# Patient Record
Sex: Female | Born: 1990
Health system: Southern US, Community
[De-identification: ages and names within clinical notes are randomized; demographics above are authoritative.]

## PROBLEM LIST (undated history)

## (undated) ENCOUNTER — Inpatient Hospital Stay (HOSPITAL_COMMUNITY): Payer: Self-pay

## (undated) DIAGNOSIS — F1910 Other psychoactive substance abuse, uncomplicated: Secondary | ICD-10-CM

## (undated) DIAGNOSIS — IMO0002 Reserved for concepts with insufficient information to code with codable children: Secondary | ICD-10-CM

## (undated) DIAGNOSIS — F1110 Opioid abuse, uncomplicated: Secondary | ICD-10-CM

## (undated) DIAGNOSIS — Z915 Personal history of self-harm: Secondary | ICD-10-CM

## (undated) DIAGNOSIS — Z9289 Personal history of other medical treatment: Secondary | ICD-10-CM

## (undated) HISTORY — PX: TONSILLECTOMY: SUR1361

## (undated) HISTORY — DX: Personal history of other medical treatment: Z92.89

---
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
## 2009-05-09 ENCOUNTER — Emergency Department (HOSPITAL_COMMUNITY): Admission: EM | Admit: 2009-05-09 | Discharge: 2009-05-09 | Payer: Self-pay | Admitting: Family Medicine

## 2009-06-10 ENCOUNTER — Emergency Department (HOSPITAL_BASED_OUTPATIENT_CLINIC_OR_DEPARTMENT_OTHER): Admission: EM | Admit: 2009-06-10 | Discharge: 2009-06-10 | Payer: Self-pay | Admitting: Emergency Medicine

## 2009-09-11 ENCOUNTER — Emergency Department (HOSPITAL_BASED_OUTPATIENT_CLINIC_OR_DEPARTMENT_OTHER): Admission: EM | Admit: 2009-09-11 | Discharge: 2009-09-11 | Payer: Self-pay | Admitting: Emergency Medicine

## 2011-01-18 ENCOUNTER — Encounter: Payer: Self-pay | Admitting: Obstetrics

## 2011-05-02 ENCOUNTER — Inpatient Hospital Stay (INDEPENDENT_AMBULATORY_CARE_PROVIDER_SITE_OTHER)
Admission: RE | Admit: 2011-05-02 | Discharge: 2011-05-02 | Disposition: A | Discharge status: Home / Self Care | Payer: Self-pay | Source: Ambulatory Visit | Attending: Family Medicine | Admitting: Family Medicine

## 2011-05-02 DIAGNOSIS — IMO0002 Reserved for concepts with insufficient information to code with codable children: Secondary | ICD-10-CM

## 2011-05-12 NOTE — Discharge Summary (Signed)
[name], [employer]               ACCOUNT NO.:  [id number]   MEDICAL RECORD NO.:  [id number]          PATIENT TYPE:  INP   LOCATION:  9153                          FACILITY:  [facility]   PHYSICIAN:  Tanya S. Shawnie Pons, M.D.   DATE OF BIRTH:  1991-07-06   DATE OF ADMISSION:  07/24/2008  DATE OF DISCHARGE:  07/26/2008                               DISCHARGE SUMMARY   [name] is an 20 year old primigravida patient at 38-1/[redacted] weeks  gestational age by ultrasound performed at approximately 24 weeks who  presented to the Maternity Admission Unit with complaints of fevers,  chills, nausea, vomiting, and right-sided low back pain.  During  evaluation in the MAU, she was found to have an acute right-sided  pyelonephritis based on an abnormal urinalysis, temperature of 99.6  degrees, as well as right flank and costovertebral angle tenderness.  She was admitted to the Antenatal Unit and received IV ceftriaxone 1 g  every 12 hours.  At approximately 8:30 p.m. on her first hospital day,  she spiked the temperature to 102.9.  She received fluid bolus and some  Tylenol; her temperature quickly resolved.  She remained afebrile for  the remainder of the hospital course.  Her iv antibiotics were continued  for 48 hours after the fever, and then she was changed to oral  antibiotics (keflex) and then discharged.  She will be discharged home  on course of oral antibiotic therapy.   Upon admission, the fetal heart rate was elevated from 160s to 170s, but  was reactive at that time.  After fluids and antipyretics, the fetal  heart rate returned to a baseline level of 140s with nice reactivity.  A  fetal heart tracing done right before discharge was also reactive and  showed good variability.  No contractions and no decelerations.   PERTINENT LABORATORY DATA:  Her urinalysis revealed trace leukocyte  esterase, positive nitrites, 7-10 white blood cells.  Her white blood  cell count on admission was 15.6.  Her  urine drug screen was negative  and her group B strep testing was also negative.   FINAL DIAGNOSES:  1. Acute right-sided pyelonephritis.  2. Pregnancy.   CONDITION AT DISCHARGE:  The patient is in good condition.  She was  given instructions to continue her antibiotics as well as followup in  the [facility] at [facility] on Monday, July 30, 2008, at  9:15 in the morning.      Odie Sera, DO  Electronically Signed     ______________________________  Shelbie Proctor. Shawnie Pons, M.D.    MC/MEDQ  D:  07/26/2008  T:  07/27/2008  Job:  [id number]
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
## 2011-05-15 NOTE — Discharge Summary (Signed)
[name], [name]               ACCOUNT NO.:  [id number]   MEDICAL RECORD NO.:  [id number]          PATIENT TYPE:  INP   LOCATION:  0103                          FACILITY:  [facility]   PHYSICIAN:  Lalla Brothers, MDDATE OF BIRTH:  [birth year]   DATE OF ADMISSION:  02/09/2007  DATE OF DISCHARGE:  02/16/2007                               DISCHARGE SUMMARY   IDENTIFICATION:  Imminently 20 year old female who had been an 11th  grade student at [employer] was admitted emergently  involuntarily on a [facility] petition for commitment in transfer  from [facility] for inpatient stabilization  and treatment of suicide risk and bipolar mood instability, having been  a run away for 2 months.  The patient had acute lacerations of the left  volar forearm, stating that life was not worth living.  She was most  distressed by breakup with boyfriend whom she had cut also, with  boyfriend harboring the patient as a runaway at various relatives' house  despite being younger than the patient himself.  She now perceives the  boyfriend is bringing a new girlfriend in to displace her, but the  patient is not willing to contract for safety.  She had significant  character erosion in the course of substance abuse and delinquent  behavior.  Mother has bipolar disorder and a history of addiction.  The  patient has been placed by the court under the guardianship of maternal  grandmother for the last 5 months, though she has now been on the run  for 2 months with family emphasizing the lack of sincere confrontation  of problems by various family members and various forms of enabling.  The patient had been on Adderall, Zoloft, Seroquel and Lamictal in the  past though she has been frequently noncompliant with medication over  the last year.  At the time of admission she is taking only Topamax 25  mg twice daily for migraine.  The patient reports a weight loss of  26  pounds recently, though she was seen in the emergency department in  [id number] weighing 98 pounds which would suggest a 9-pound weight loss.  For full details please see the typed admission assessment.   SYNOPSIS OF PRESENT ILLNESS:  Patient is initially devaluing and  resistant toward treatment and expects immediate discharge.  Patient  cries and laughs inappropriately with severe denial for symptoms and  consequences.  She acknowledges use of Xanax and Ecstasy once each in  the past, while reporting alcohol every 3 months and cannabis the night  before admission.  She minimizes substance use other than stating that  she has smoked 1 pack per day of cigarettes for 8 years.  There is  significant family history of addiction, especially in biological mother  who is apparently engaged to marry again, currently having bipolar  disorder and history of crack abuse.  Brother has ADHD.  Grandmother has  had extensive surgery.  THE PATIENT REPORTS ALLERGY TO DEXTROMETHORPHAN.  She reports tonsillectomy at age 74.  She has fibrocystic breasts.  She  had menarche at age  11 and menses have been regular.  She is thin and  undernourished in habitus.  She was treated for chlamydia in  October2007, and had her last GYN exam of [id number] with Dr. Selena Batten in  [place].  She had a CT scan of the head in [id number] when she went to  the emergency department for migraine with a CT being negative.   INITIAL MENTAL STATUS EXAM:  The patient has more low-functioning  neurosis than definite character disorder, though she had character  erosion with her delinquent behavior and substance abuse.  At the time  of admission, she has accelerated speech with some pressure.  Oppositional externalization is evident.  She has significant dysphoria  with desire to die.  She has little concern for her own problems or  necessary solutions.  She has suicidal ideation with a plan to die and a  history of progressive self  cutting.   LABORATORY FINDINGS:  CBC was normal with white count 6800, hemoglobin  13.3, MCV of 90 and platelet count 296,000.  Comprehensive metabolic  panel on admission revealed random glucose 134 at 2010 hours otherwise  normal with sodium 138, potassium 3.8, CO2 25, chloride 103, creatinine  0.79, calcium 9.7, albumin 3.9, AST 15, ALT 10 and GGT 17.  A repeat  comprehensive metabolic panel on the day before discharge on Topamax 200  mg daily in divided doses revealed CO2 of 25 with reference range 19-32  and chloride 103 with reference range 96-112.  Sodium was normal at 136,  potassium 4.1, random glucose 98, creatinine 0.83, calcium 9.1 and  albumin 3.6 with AST 16 and ALT 9.  Free T4 was normal at 1.15 and TSH  at 0.518.  Urine HCG was negative.  Urine drug screen was positive for  marijuana metabolites quantitated at 71 ng/mL with creatinine of 306  mg/dL, documenting adequate specimen.  Urinalysis was normal with  specific gravity of 1.028, small amount of bilirubin, ketones of 15,  small amount leukocyte esterase, 7-10 WBC, 0-2 RBC, few bacteria and  many epithelial with mucus present, suggesting a poor clean-catch.  RPR  was nonreactive.  Urine probe for Chlamydia trachomatis  by DNA  amplification was positive but that for gonorrhea was negative.  Electrocardiogram revealed low voltage in the limb and precordial leads  with combined R waves in leads I, II and III being 10 mm.  Rate was 87,  PR was 126, QRS of 78 and QTc of 440 milliseconds.   HOSPITAL COURSE AND TREATMENT:  General medical exam by Jorje Guild, PA-C  noted the patient to be thin, overall having moderate protein calorie  malnutrition.  Neurological exam was intact.  Admission height was 61-  3/4 inches and weight was 40.5 kg, with subsequent weight 41 kg midway  through the hospital stay and 41.5 kg at discharge.  Initial blood  pressure was 100/61 with heart rate of 94 supine and 118/68 with heart rate of 84  standing.  At the time of discharge, supine blood pressure  was 96/62 with heart rate of 85 and standing blood pressure 115/64 with  heart rate of 99.  Nutrition consult facilitated appropriate refeeding,  noting the patient to be 77% of her usual weight of 115 pounds, having a  BMI of 16.3.  She requires 1900-2100 kilocalories daily with 45-55 grams  of protein in at least 1900 mL of fluid.  She had Ensure supplements,  Lactaid at meals, multivitamin/multimineral, and more than 100% of her  needs  for adequate growth and weight gain to be met.  The patient did  cooperate with her nutritional refeeding, and had no evidence of  refeeding syndrome.  She had no withdrawal syndrome to suggest  physiologic need for detoxification.  Still, she manifests significant  craving initially requiring Nicoderm 14 mg patch as well as increased  Topamax.  Substance abuse consultation by Cleophas Dunker, LMST, CSAC, CTS  included cannabis abuse and alcohol abuse, recommending outpatient  treatment to include children of alcoholics group.  As the patient had  sustained sobriety, adequate nutrition and multidisciplinary therapies,  the patient softened to be appreciative of treatment and to look  forehead to opportunities for continued care even though she would  frequently regress as she maintained initially the demanding return to  the ex-boyfriend and the street.  Patient was recognized as a  significant risk to run again.  Family therapy was carried out with  maternal grandmother and mother, and every attempt was made to  coordinate the patient's subsequent care through the [facility] relative to her emergency petition for admission, Clearence Ped,  who has supervise the patient's placement at guardian grandmother's, and  Frederic Jericho,  who has provided the patient's outpatient treatment.  The  family concludes that DFS workers Elliot Gault and Sherian Rein, are  being overseen by the judge due  to the patient's ability to manipulate  and charm others into seeing her as capable of good judgment including  about presence of family.  Patient had previously been considered for  the [facility] group home in [place], though the family  preferred [facility].  Family reports the patient had bad side  effects on Seroquel and was noncompliant with Lamictal and Zoloft.  Therefore every effort was made to maximize with Topamax for mood,  substance abuse and impulse control disorder treatments in addition to  migraine, and dose was titrated up to 100 mg morning and bedtime and  tolerated well.  Maternal aunt has depression and there is family  history of diabetes and hypertension.  The patient has used cannabis  with her biological father and alcohol with friends.  She had 3 years of  therapy with Frederic Jericho at [phone].  Paperwork and proceedings for  entry into the [facility] program at [facility] in [place] was carried forth and extent that the hospital can accomplished that and the  family will need to sustain such.  They have no chance of entering there  for at least 2 weeks.  An [facility] was  available in [place], and the patient was discharged there  with the  agreement of the patient and family, and full knowledge of all others  involved.  The patient required no seclusion or restraint during  hospital stay.   FINAL DIAGNOSES:  AXIS I:  1. Bipolar disorder, mixed, moderate severity.  2. Oppositional defiant disorder.  3. Attention deficit hyperactivity disorder, combined type, moderate      severity.  4. Cannabis abuse.  5. Alcohol abuse.  6..  Parent-child problem.  1. Other interpersonal problem.  2. Other specified family circumstances.  3. Noncompliance with treatment.  AXIS II:  Diagnosis deferred.  AXIS III:  1. Self-inflicted lacerations, left forearm.  2. Migraine.  3. Moderate to severe protein calorie  malnutrition with severe weight      loss and EKG changes.  4. Birth control pill.  5. Fibrocystic breast.  6. Cigarette smoking.  7. Low  voltage EKG, possibly associated with weight loss and protein      calorie malnutrition.  8. Asymptomatic chlamydia urethritis.  9. ALLERGY TO DEXTROMETHORPHAN.  AXIS IV:  Stressors family severe, acute and chronic; legal moderate,  acute and chronic; phase of life severe, acute and chronic; medical  moderate, acute and chronic; school severe, acute and chronic.  AXIS V:  Global assessment of functioning on admission 36 with highest  in last year 57, and discharge global assessment of functioning was 47.   PLAN:  The patient has significant ongoing treatment needs but has made  reasonable progress in initial acute inpatient treatment.  Grounds were  not present to effectively pursue extension of acute inpatient treatment  involuntarily, or singular court commitment to residential treatment  without consideration of the patient's illegal behavior and violation of  court proceedings for placement thus far.  The patient is willing to  enter the [facility] program as well.  The patient is discharged  to mother and maternal grandmother to enter the [facility]  in [place], to see Charise Carwin at [phone] for that purpose after  discharge on the day of discharge.  Patient follows a weight gain diet  as per nutrition consult February 10, 2007.  She has no other  restrictions on activity except no runaway behavior, substance abuse, or  contact with ex-boyfriend.  Wounds are healing well and require no  special care other than protection from further injury.  She is  prescribed Topamax 100 mg every morning and bedtime, quantity #60 with 1  refill, and can use a multivitamin/ multimineral every morning over-the-  counter.  They are educated on the medications, laboratory findings,  diagnoses and FDA guidelines on treatment.  Zoloft and  Lamictal were not restarted, apparently last taking these 1 year ago, and Topamax more  recently though at a low dose until currently.  The patient has no  indication of pregnancy, though she had reported trying to get pregnant  with ex-boyfriend prior to being found by police.  She did receive  Zithromax 1000 mg, and retained and tolerated this well during the  hospital stay for her chlamydia urethritis and is educated on public  health containment of communicability guidelines.  Therapy over the last  3 years has been with Frederic Jericho, and she had some psychiatric care  apparently a [facility].      Lalla Brothers, MD  Electronically Signed     GEJ/MEDQ  D:  02/20/2007  T:  02/21/2007  Job:  [id number]   cc:   Frederic Jericho, phone [phone]

## 2011-07-08 ENCOUNTER — Emergency Department (HOSPITAL_COMMUNITY)
Admission: EM | Admit: 2011-07-08 | Discharge: 2011-07-08 | Disposition: A | Discharge status: Home / Self Care | Payer: Medicaid Other | Attending: Emergency Medicine | Admitting: Emergency Medicine

## 2011-07-08 DIAGNOSIS — H109 Unspecified conjunctivitis: Secondary | ICD-10-CM | POA: Insufficient documentation

## 2011-07-08 DIAGNOSIS — G43909 Migraine, unspecified, not intractable, without status migrainosus: Secondary | ICD-10-CM | POA: Insufficient documentation

## 2011-09-15 ENCOUNTER — Inpatient Hospital Stay (INDEPENDENT_AMBULATORY_CARE_PROVIDER_SITE_OTHER)
Admission: RE | Admit: 2011-09-15 | Discharge: 2011-09-15 | Disposition: A | Discharge status: Home / Self Care | Payer: Medicaid Other | Source: Ambulatory Visit | Attending: Family Medicine | Admitting: Family Medicine

## 2011-09-15 DIAGNOSIS — J019 Acute sinusitis, unspecified: Secondary | ICD-10-CM

## 2011-09-15 DIAGNOSIS — J40 Bronchitis, not specified as acute or chronic: Secondary | ICD-10-CM

## 2011-09-25 LAB — RAPID URINE DRUG SCREEN, HOSP PERFORMED
Barbiturates: NOT DETECTED
Benzodiazepines: NOT DETECTED
Tetrahydrocannabinol: NOT DETECTED

## 2011-09-25 LAB — URINE MICROSCOPIC-ADD ON

## 2011-09-25 LAB — POCT URINALYSIS DIP (DEVICE)
Bilirubin Urine: NEGATIVE
Glucose, UA: NEGATIVE
Ketones, ur: NEGATIVE
Specific Gravity, Urine: 1.015
pH: 6

## 2011-09-25 LAB — URINALYSIS, ROUTINE W REFLEX MICROSCOPIC
Glucose, UA: NEGATIVE
Ketones, ur: 15 — AB
Nitrite: POSITIVE — AB
Protein, ur: 30 — AB
Specific Gravity, Urine: 1.025
pH: 5.5

## 2011-09-25 LAB — CBC
HCT: 32.5 — ABNORMAL LOW
Hemoglobin: 10.6 — ABNORMAL LOW
MCHC: 33.8
MCV: 93.5
Platelets: 197
RBC: 3.31 — ABNORMAL LOW
RDW: 13.4
RDW: 13.6
WBC: 15.4 — ABNORMAL HIGH

## 2011-09-25 LAB — RPR: RPR Ser Ql: NONREACTIVE

## 2011-09-25 LAB — STREP B DNA PROBE: Strep Group B Ag: NEGATIVE

## 2011-09-25 LAB — URINE CULTURE

## 2011-09-25 LAB — COMPREHENSIVE METABOLIC PANEL
Glucose, Bld: 97
Total Protein: 5.8 — ABNORMAL LOW

## 2012-07-19 ENCOUNTER — Encounter (HOSPITAL_COMMUNITY): Payer: Self-pay

## 2012-07-19 ENCOUNTER — Emergency Department (HOSPITAL_COMMUNITY)
Admission: EM | Admit: 2012-07-19 | Discharge: 2012-07-19 | Disposition: A | Discharge status: Home / Self Care | Payer: Self-pay | Attending: Emergency Medicine | Admitting: Emergency Medicine

## 2012-07-19 DIAGNOSIS — N390 Urinary tract infection, site not specified: Secondary | ICD-10-CM

## 2012-07-19 DIAGNOSIS — B9689 Other specified bacterial agents as the cause of diseases classified elsewhere: Secondary | ICD-10-CM

## 2012-07-19 DIAGNOSIS — F17200 Nicotine dependence, unspecified, uncomplicated: Secondary | ICD-10-CM | POA: Insufficient documentation

## 2012-07-19 DIAGNOSIS — N949 Unspecified condition associated with female genital organs and menstrual cycle: Secondary | ICD-10-CM | POA: Insufficient documentation

## 2012-07-19 DIAGNOSIS — N898 Other specified noninflammatory disorders of vagina: Secondary | ICD-10-CM | POA: Insufficient documentation

## 2012-07-19 LAB — URINE MICROSCOPIC-ADD ON

## 2012-07-19 LAB — WET PREP, GENITAL
Clue Cells Wet Prep HPF POC: NONE SEEN
Trich, Wet Prep: NONE SEEN
Yeast Wet Prep HPF POC: NONE SEEN

## 2012-07-19 LAB — URINALYSIS, ROUTINE W REFLEX MICROSCOPIC
Glucose, UA: NEGATIVE mg/dL
Hgb urine dipstick: NEGATIVE
Ketones, ur: 15 mg/dL — AB
Nitrite: POSITIVE — AB
Protein, ur: 30 mg/dL — AB
Specific Gravity, Urine: 1.029 (ref 1.005–1.030)
Urobilinogen, UA: 0.2 mg/dL (ref 0.0–1.0)
pH: 6 (ref 5.0–8.0)

## 2012-07-19 LAB — POCT PREGNANCY, URINE: Preg Test, Ur: NEGATIVE

## 2012-07-19 MED ORDER — SULFAMETHOXAZOLE-TMP DS 800-160 MG PO TABS
1.0000 | ORAL_TABLET | Freq: Once | ORAL | Status: AC
  Administered 2012-07-19: 1 via ORAL
  Filled 2012-07-19: qty 1

## 2012-07-19 MED ORDER — TRAMADOL HCL 50 MG PO TABS: 50.0000 mg | ORAL_TABLET | Freq: Four times a day (QID) | ORAL | Status: AC | PRN

## 2012-07-19 MED ORDER — METRONIDAZOLE 500 MG PO TABS
500.0000 mg | ORAL_TABLET | Freq: Once | ORAL | Status: AC
  Administered 2012-07-19: 500 mg via ORAL
  Filled 2012-07-19: qty 1

## 2012-07-19 MED ORDER — SULFAMETHOXAZOLE-TMP DS 800-160 MG PO TABS: 1.0000 | ORAL_TABLET | Freq: Two times a day (BID) | ORAL | Status: AC

## 2012-07-19 MED ORDER — METRONIDAZOLE 500 MG PO TABS: 500.0000 mg | ORAL_TABLET | Freq: Two times a day (BID) | ORAL | Status: AC

## 2012-07-19 NOTE — ED Notes (Signed)
Pt c/o mid lower abd pain x's 2 days.  Also has vag. Discharge.

## 2012-07-19 NOTE — ED Notes (Signed)
sts abd pain, onset last Thursday getting worse, sts worse than child birth.

## 2012-07-19 NOTE — ED Provider Notes (Signed)
History     CSN: [id number]  Arrival date & time 07/19/12  1318   First MD Initiated Contact with Patient 07/19/12 1808      Chief Complaint  Patient presents with  . Abdominal Pain    (Consider location/radiation/quality/duration/timing/severity/associated sxs/prior treatment) HPI The patient presents with abdominal pain.  She notes her symptoms began 4 or 5 days ago, insidiously.  Since onset the pain has been worsening the pain is focally about the suprapubic area.  The pain is crampy, sharp, intermittent, though increasingly more present.  There are no clear exacerbating or alleviating factors.  The patient notes concurrent vaginal discharge, malodorous, without bleeding.  She denies any ongoing fevers, chills, dyspnea, chest pain or any other focal complaints. She notes a distant history of one prior STD, no current sexual activity. No past medical history on file.  No past surgical history on file.  No family history on file.  History  Substance Use Topics  . Smoking status: Current Everyday Smoker  . Smokeless tobacco: Not on file  . Alcohol Use: Yes    OB History    Grav Para Term Preterm Abortions TAB SAB Ect Mult Living                  Review of Systems  Constitutional:       HPI  HENT:       HPI otherwise negative  Eyes: Negative.   Respiratory:       HPI, otherwise negative  Cardiovascular:       HPI, otherwise nmegative  Gastrointestinal: Negative for vomiting.  Genitourinary:       HPI, otherwise negative  Musculoskeletal:       HPI, otherwise negative  Skin: Negative.   Neurological: Negative for syncope.    Allergies  Review of patient's allergies indicates no known allergies.  Home Medications  No current outpatient prescriptions on file.  BP 99/54  Pulse 82  Temp 98.3 F (36.8 C) (Oral)  Resp 18  SpO2 99%  Physical Exam  Nursing note and vitals reviewed. Constitutional: She is oriented to person, place, and time. She appears  well-developed and well-nourished. No distress.  HENT:  Head: Normocephalic and atraumatic.  Eyes: Conjunctivae and EOM are normal.  Cardiovascular: Normal rate and regular rhythm.   Pulmonary/Chest: Effort normal and breath sounds normal. No stridor. No respiratory distress.  Abdominal: She exhibits no distension. A hernia is present. Hernia confirmed positive in the right inguinal area and confirmed positive in the left inguinal area.  Genitourinary: No labial fusion. There is no rash, tenderness, lesion or injury on the right labia. There is no rash, tenderness, lesion or injury on the left labia. There is tenderness around the vagina. No erythema or bleeding around the vagina. No foreign body around the vagina. Vaginal discharge found.  Musculoskeletal: She exhibits no edema.  Lymphadenopathy:       Right: Inguinal adenopathy present.       Left: Inguinal adenopathy present.  Neurological: She is alert and oriented to person, place, and time. No cranial nerve deficit.  Skin: Skin is warm and dry.       Multiple tattoos  Psychiatric: She has a normal mood and affect.    ED Course  Procedures (including critical care time)  Labs Reviewed  URINALYSIS, ROUTINE W REFLEX MICROSCOPIC - Abnormal; Notable for the following:    APPearance TURBID (*)     Bilirubin Urine SMALL (*)     Ketones, ur 15 (*)  Protein, ur 30 (*)     Nitrite POSITIVE (*)     Leukocytes, UA SMALL (*)     All other components within normal limits  URINE MICROSCOPIC-ADD ON - Abnormal; Notable for the following:    Squamous Epithelial / LPF MANY (*)     Bacteria, UA MANY (*)     All other components within normal limits  POCT PREGNANCY, URINE  PREGNANCY, URINE  WET PREP, GENITAL  GC/CHLAMYDIA PROBE AMP, GENITAL   No results found.   No diagnosis found.    MDM  This otherwise well young female presents with new suprapubic pain, vaginal discharge.  On exam the patient has no cervical motion tenderness,  but does have vaginal discharge.  Given the patient's description of pain, discharge, she will be treated with antibiotics for her UTI, Flagyl for her bacterial vaginosis.  She was advised that her gonorrhea and Chlamydia studies are pending.  Given her endorsement of a monogamous relationship with little recent sexual activity, she will not be treated empirically  Gerhard Munch, MD 07/19/12 1916

## 2012-07-20 LAB — GC/CHLAMYDIA PROBE AMP, GENITAL: GC Probe Amp, Genital: NEGATIVE

## 2012-11-14 ENCOUNTER — Emergency Department (HOSPITAL_COMMUNITY)
Admission: EM | Admit: 2012-11-14 | Discharge: 2012-11-14 | Disposition: A | Discharge status: Home / Self Care | Payer: Self-pay | Attending: Emergency Medicine | Admitting: Emergency Medicine

## 2012-11-14 ENCOUNTER — Emergency Department (HOSPITAL_COMMUNITY): Payer: Self-pay

## 2012-11-14 DIAGNOSIS — S20219A Contusion of unspecified front wall of thorax, initial encounter: Secondary | ICD-10-CM | POA: Insufficient documentation

## 2012-11-14 DIAGNOSIS — IMO0002 Reserved for concepts with insufficient information to code with codable children: Secondary | ICD-10-CM | POA: Insufficient documentation

## 2012-11-14 DIAGNOSIS — F17200 Nicotine dependence, unspecified, uncomplicated: Secondary | ICD-10-CM | POA: Insufficient documentation

## 2012-11-14 DIAGNOSIS — Y9389 Activity, other specified: Secondary | ICD-10-CM | POA: Insufficient documentation

## 2012-11-14 DIAGNOSIS — Y9289 Other specified places as the place of occurrence of the external cause: Secondary | ICD-10-CM | POA: Insufficient documentation

## 2012-11-14 MED ORDER — HYDROCODONE-ACETAMINOPHEN 5-325 MG PO TABS: 1.0000 | ORAL_TABLET | ORAL | Status: DC | PRN

## 2012-11-14 NOTE — ED Notes (Signed)
Got pulled down front steps by her dog x 2 days ago hurt rt ribs hurts to breath and move

## 2012-11-14 NOTE — ED Provider Notes (Signed)
History     CSN: [id number]  Arrival date & time 11/14/12  1404   First MD Initiated Contact with Patient 11/14/12 1626      No chief complaint on file.   (Consider location/radiation/quality/duration/timing/severity/associated sxs/prior treatment) Patient is a 21 y.o. female presenting with chest pain. The history is provided by the patient. No language interpreter was used.  Chest Pain The chest pain began 2 days ago (Pt was going down stairs with her dog on a leash.  The dog pulled her down and she struck the right antero-lateral chest.). Chest pain occurs constantly. The chest pain is unchanged. The pain is associated with breathing and coughing. At its most intense, the pain is at 9/10. The pain is currently at 9/10. The severity of the pain is severe. The quality of the pain is described as sharp. The pain does not radiate. Chest pain is worsened by deep breathing. She tried nothing for the symptoms. There are no known risk factors. Past medical history comments: None     No past medical history on file.  No past surgical history on file.  No family history on file.  History  Substance Use Topics  . Smoking status: Current Every Day Smoker  . Smokeless tobacco: Not on file  . Alcohol Use: Yes    OB History    Grav Para Term Preterm Abortions TAB SAB Ect Mult Living                  Review of Systems  Cardiovascular: Positive for chest pain.  All other systems reviewed and are negative.    Allergies  Review of patient's allergies indicates no known allergies.  Home Medications   Current Outpatient Rx  Name  Route  Sig  Dispense  Refill  . HYDROCODONE-ACETAMINOPHEN 5-325 MG PO TABS   Oral   Take 1 tablet by mouth every 4 (four) hours as needed for pain.   20 tablet   0     BP 98/54  Pulse 102  Temp 98.3 F (36.8 C)  Resp 16  SpO2 99%  Physical Exam  Nursing note and vitals reviewed. Constitutional: She is oriented to person, place, and time.    Neck: Normal range of motion. Neck supple.  Cardiovascular: Normal rate, regular rhythm and normal heart sounds.   Pulmonary/Chest: Effort normal and breath sounds normal.       Has mild-moderate tenderness in right anterior chest at right costal margin.  No crepitance or subcutaneous emphysema.    Abdominal: Soft. Bowel sounds are normal. She exhibits no distension. There is no tenderness.  Musculoskeletal: Normal range of motion.  Neurological: She is alert and oriented to person, place, and time.       No sensory or motor deficit.  Skin: Skin is warm and dry.  Psychiatric: She has a normal mood and affect. Her behavior is normal.    ED Course  Procedures (including critical care time)  Labs Reviewed - No data to display Dg Ribs Unilateral W/chest Right  11/14/2012  *RADIOLOGY REPORT*  Clinical Data: 21 year old female status post blunt trauma 4 days ago with pain with coughing and difficulty breathing.  Fall.  RIGHT RIBS AND CHEST - 3+ VIEW  Comparison: None.  Findings: Normal lung volumes. Normal cardiac size and mediastinal contours.  Visualized tracheal air column is within normal limits. The lungs are clear.  No pneumothorax or effusion. Bone mineralization is within normal limits.  Oblique views of the right ribs with a  marker at the anterior right 8th costochondral margin.  No displaced right rib fracture identified.  No acute osseous abnormality identified.  IMPRESSION: No acute cardiopulmonary abnormality or acute traumatic injury identified.  No displaced right rib fracture identified.   Original Report Authenticated By: Odessa Fleming III, M.D.      1. Chest wall contusion    Rx hydrocodone-acetaminophen q4h prn pain.      Carleene Cooper III, MD 11/14/12 [phone]

## 2013-02-04 ENCOUNTER — Encounter (HOSPITAL_BASED_OUTPATIENT_CLINIC_OR_DEPARTMENT_OTHER): Payer: Self-pay | Admitting: *Deleted

## 2013-02-04 ENCOUNTER — Emergency Department (HOSPITAL_BASED_OUTPATIENT_CLINIC_OR_DEPARTMENT_OTHER)
Admission: EM | Admit: 2013-02-04 | Discharge: 2013-02-04 | Disposition: A | Discharge status: Home / Self Care | Payer: Self-pay | Attending: Emergency Medicine | Admitting: Emergency Medicine

## 2013-02-04 DIAGNOSIS — F17200 Nicotine dependence, unspecified, uncomplicated: Secondary | ICD-10-CM | POA: Insufficient documentation

## 2013-02-04 DIAGNOSIS — K0889 Other specified disorders of teeth and supporting structures: Secondary | ICD-10-CM

## 2013-02-04 DIAGNOSIS — R220 Localized swelling, mass and lump, head: Secondary | ICD-10-CM | POA: Insufficient documentation

## 2013-02-04 DIAGNOSIS — R221 Localized swelling, mass and lump, neck: Secondary | ICD-10-CM | POA: Insufficient documentation

## 2013-02-04 DIAGNOSIS — K089 Disorder of teeth and supporting structures, unspecified: Secondary | ICD-10-CM | POA: Insufficient documentation

## 2013-02-04 MED ORDER — PENICILLIN V POTASSIUM 500 MG PO TABS: 500.0000 mg | ORAL_TABLET | Freq: Four times a day (QID) | ORAL | Status: DC

## 2013-02-04 MED ORDER — HYDROCODONE-ACETAMINOPHEN 5-325 MG PO TABS: 2.0000 | ORAL_TABLET | ORAL | Status: DC | PRN

## 2013-02-04 NOTE — ED Provider Notes (Signed)
History     CSN: [id number]  Arrival date & time 02/04/13  1554   First MD Initiated Contact with Patient 02/04/13 1627      Chief Complaint  Patient presents with  . Dental Pain    (Consider location/radiation/quality/duration/timing/severity/associated sxs/prior treatment) Patient is a 22 y.o. female presenting with tooth pain. The history is provided by the patient. No language interpreter was used.  Dental PainThe primary symptoms include mouth pain. Episode onset: 2 weeks. The symptoms are worsening. The symptoms are new. The symptoms occur constantly.  Additional symptoms include: gum swelling and gum tenderness. Medical issues do not include: alcohol problem.  Pt complains of pain in her mouth,  Lower back molar right side  History reviewed. No pertinent past medical history.  Past Surgical History  Procedure Laterality Date  . Tonsillectomy      History reviewed. No pertinent family history.  History  Substance Use Topics  . Smoking status: Current Every Day Smoker  . Smokeless tobacco: Not on file  . Alcohol Use: Yes    OB History   Grav Para Term Preterm Abortions TAB SAB Ect Mult Living                  Review of Systems  HENT: Positive for dental problem.   All other systems reviewed and are negative.    Allergies  Review of patient's allergies indicates no known allergies.  Home Medications   Current Outpatient Rx  Name  Route  Sig  Dispense  Refill  . Acetaminophen (ACETAMIN PO)   Oral   Take 3 tablets by mouth 3 (three) times daily as needed. For pain         . Aspirin-Acetaminophen-Caffeine (GOODY HEADACHE PO)   Oral   Take 2 packets by mouth daily as needed. For pain         . HYDROcodone-acetaminophen (NORCO/VICODIN) 5-325 MG per tablet   Oral   Take 1 tablet by mouth every 4 (four) hours as needed for pain.   20 tablet   0   . Multiple Vitamin (MULTIVITAMIN WITH MINERALS) TABS   Oral   Take 1 tablet by mouth daily.           BP 100/72  Pulse 81  Temp(Src) 98.3 F (36.8 C) (Oral)  Resp 18  Ht 5' (1.524 m)  Wt 97 lb (43.999 kg)  BMI 18.94 kg/m2  SpO2 100%  Physical Exam  Nursing note and vitals reviewed. Constitutional: She is oriented to person, place, and time.  HENT:  Head: Normocephalic.  Mouth/Throat: Oropharynx is clear and moist.  Decay cavity outer botttom of right lower molar  Eyes: Pupils are equal, round, and reactive to light.  Neck: Normal range of motion.  Cardiovascular: Normal rate.   Pulmonary/Chest: Effort normal.  Neurological: She is alert and oriented to person, place, and time.  Skin: Skin is warm.  Psychiatric: She has a normal mood and affect.    ED Course  Procedures (including critical care time)  Labs Reviewed - No data to display No results found.   No diagnosis found.    MDM  pcn vk 500, hydrocodone,   Follow up with Dr Lucky Cowboy for dental evaluation        Lonia Skinner [place], [place] 02/04/13 1659

## 2013-02-04 NOTE — ED Provider Notes (Signed)
Medical screening examination/treatment/procedure(s) were performed by non-physician practitioner and as supervising physician I was immediately available for consultation/collaboration.    Nelia Shi, MD 02/04/13 1700

## 2013-02-04 NOTE — ED Notes (Signed)
Pt c/o pain to right lower molar x 2 days. Unable to eat. Can't see dentist for several weeks.

## 2013-04-20 ENCOUNTER — Encounter (HOSPITAL_COMMUNITY): Payer: Self-pay | Admitting: Cardiology

## 2013-04-20 ENCOUNTER — Emergency Department (HOSPITAL_COMMUNITY)
Admission: EM | Admit: 2013-04-20 | Discharge: 2013-04-20 | Disposition: A | Discharge status: Home / Self Care | Payer: Self-pay | Attending: Emergency Medicine | Admitting: Emergency Medicine

## 2013-04-20 DIAGNOSIS — L600 Ingrowing nail: Secondary | ICD-10-CM | POA: Insufficient documentation

## 2013-04-20 DIAGNOSIS — F17200 Nicotine dependence, unspecified, uncomplicated: Secondary | ICD-10-CM | POA: Insufficient documentation

## 2013-04-20 MED ORDER — HYDROCODONE-ACETAMINOPHEN 5-325 MG PO TABS: ORAL_TABLET | ORAL | Status: DC

## 2013-04-20 MED ORDER — IBUPROFEN 800 MG PO TABS: 800.0000 mg | ORAL_TABLET | Freq: Three times a day (TID) | ORAL | Status: DC

## 2013-04-20 MED ORDER — IBUPROFEN 400 MG PO TABS
800.0000 mg | ORAL_TABLET | Freq: Once | ORAL | Status: AC
  Administered 2013-04-20: 800 mg via ORAL
  Filled 2013-04-20: qty 2

## 2013-04-20 NOTE — ED Provider Notes (Signed)
History     CSN: [id number]  Arrival date & time 04/20/13  1400   First MD Initiated Contact with Patient 04/20/13 1434      Chief Complaint  Patient presents with  . Toe Pain    (Consider location/radiation/quality/duration/timing/severity/associated sxs/prior treatment) Patient is a 22 y.o. female presenting with toe pain. The history is provided by the patient. No language interpreter was used.  Toe Pain Pertinent negatives include no chills or fever.  Pt is a 22yo female with hx of ingrown toenails c/o right great toe pain x3 days.  Pt states she has been trying to cut back her nails to prevent them from becoming ingrown ever since she was little.  Current toe pain is moderate in severity.  Drained small amount of pus and blood.  Pt has been trying to soak feet in bath before cutting nails.  Has tried 800mg  Ibuprofen with minimal relief.  Denies fever, n/v/d.  Pain does not radiate into foot or lower leg.  Pt does not have a PCP at the moment.  Was told she could come to ER for toenail removal.  History reviewed. No pertinent past medical history.  Past Surgical History  Procedure Laterality Date  . Tonsillectomy      History reviewed. No pertinent family history.  History  Substance Use Topics  . Smoking status: Current Every Day Smoker -- 0.50 packs/day    Types: Cigarettes  . Smokeless tobacco: Not on file  . Alcohol Use: Yes    OB History   Grav Para Term Preterm Abortions TAB SAB Ect Mult Living                  Review of Systems  Constitutional: Negative for fever and chills.  Skin: Positive for wound ( right great toe).    Allergies  Other  Home Medications   Current Outpatient Rx  Name  Route  Sig  Dispense  Refill  . ibuprofen (ADVIL,MOTRIN) 200 MG tablet   Oral   Take 800 mg by mouth every 6 (six) hours as needed for pain.         Marland Kitchen HYDROcodone-acetaminophen (NORCO/VICODIN) 5-325 MG per tablet      Take 1-2 tabs every 4-6hrs as needed for  pain   10 tablet   0   . ibuprofen (ADVIL,MOTRIN) 800 MG tablet   Oral   Take 1 tablet (800 mg total) by mouth 3 (three) times daily.   21 tablet   0     BP 113/68  Pulse 90  Temp(Src) 97.8 F (36.6 C) (Oral)  Resp 16  SpO2 100%  Physical Exam  Nursing note and vitals reviewed. Constitutional: She appears well-developed and well-nourished. No distress.  HENT:  Head: Normocephalic and atraumatic.  Eyes: Conjunctivae are normal. No scleral icterus.  Neck: Normal range of motion. Neck supple.  Cardiovascular: Normal rate, regular rhythm and normal heart sounds.   Pulmonary/Chest: Effort normal and breath sounds normal. No respiratory distress. She has no wheezes. She has no rales. She exhibits no tenderness.  Musculoskeletal: Normal range of motion. She exhibits tenderness ( right great toe).       Right ankle: Normal.       Feet:  Neurological: She is alert.  Skin: Skin is warm and dry. She is not diaphoretic. There is erythema ( mild erythema on borders of each toenail. No paronychia). No cyanosis. Nails show no clubbing.  Mild erythema around right great toe.  No active drainage of pus.  Nail partially removed at distal borders.   Psychiatric: She has a normal mood and affect. Her behavior is normal. Judgment and thought content normal.    ED Course  NAIL REMOVAL Date/Time: 04/20/2013 3:38 PM Performed by: Junius Finner Authorized by: Junius Finner Consent: Verbal consent obtained. written consent not obtained. Risks and benefits: risks, benefits and alternatives were discussed Consent given by: patient Patient understanding: patient states understanding of the procedure being performed Patient consent: the patient's understanding of the procedure matches consent given Procedure consent: procedure consent matches procedure scheduled Relevant documents: relevant documents present and verified Patient identity confirmed: verbally with patient Time out: Immediately  prior to procedure a "time out" was called to verify the correct patient, procedure, equipment, support staff and site/side marked as required. Location: right foot Location details: right big toe Anesthesia: digital block Local anesthetic: lidocaine 2% with epinephrine Patient sedated: no Preparation: skin prepped with ChloraPrep Amount removed: 1/4 Side: radial and ulnar Wedge excision of skin of nail fold: no Nail bed sutured: no Nail matrix removed: partial Removed nail replaced and anchored: no Dressing: 4x4 and dressing applied Patient tolerance: Patient tolerated the procedure well with no immediate complications.   (including critical care time)  Labs Reviewed - No data to display No results found.   1. Ingrown right greater toenail       MDM  Pt with hx of ingrown toenails c/o right toe pain that started 3days ago.  Was told by friend to come to ED for nail removal.  Right great to has mild erythema around the nail, not actively draining.  TTP.  No obvious abscess or infection.  Partial nail removal.  See procedure note  Rx: norco and ibuprofen.  Advised not to drive while taking norco. Provided pt with referral to [facility] and pt education packet on ingrown toenails.   F/u with PCP and or with [facility] for ongoing care and management of ingrown toenails   Vitals: unremarkable. Discharged in stable condition.    Discussed pt with attending during ED encounter.    MDM Number of Diagnoses or Management Options Ingrown right greater toenail:          Junius Finner, PA-C 04/20/13 1544

## 2013-04-20 NOTE — ED Notes (Signed)
C/o bilateral toe pain due to ingrown toenails. States has had problems since she was a child.

## 2013-04-20 NOTE — ED Notes (Signed)
Error in charting Departure condition and Care handoff.

## 2013-04-20 NOTE — ED Notes (Signed)
Pt reports she has an on-going problem with ingrown toe nails. States she has them on both feet, she has tried to "dig" them out at home. Still having pain.

## 2013-04-22 NOTE — ED Provider Notes (Signed)
Medical screening examination/treatment/procedure(s) were conducted as a shared visit with non-physician practitioner(s) and myself.  I personally evaluated the patient during the encounter  NERVE BLOCK Performed by: Lyanne Co Consent: Verbal consent obtained. Required items: required blood products, implants, devices, and special equipment available Time out: Immediately prior to procedure a "time out" was called to verify the correct patient, procedure, equipment, support staff and site/side marked as required.  Indication: toe nail procedure Nerve block body site: right great toe digital nerves Preparation: Patient was prepped and draped in the usual sterile fashion. Needle gauge: 24 G Location technique: anatomical landmarks Local anesthetic: lidocaine 2% with epi Anesthetic total: 4 ml Outcome: pain improved Patient tolerance: Patient tolerated the procedure well with no immediate complications.  I personally performed one half of the nail excision.  I personally performed the nerve block.  No secondary signs of infection.  Podiatry followup recommend    Lyanne Co, MD 04/22/13 1122

## 2013-12-27 ENCOUNTER — Emergency Department (HOSPITAL_COMMUNITY)
Admission: EM | Admit: 2013-12-27 | Discharge: 2013-12-27 | Disposition: A | Discharge status: Home / Self Care | Payer: Medicaid Other | Attending: Emergency Medicine | Admitting: Emergency Medicine

## 2013-12-27 ENCOUNTER — Encounter (HOSPITAL_COMMUNITY): Payer: Self-pay | Admitting: Emergency Medicine

## 2013-12-27 DIAGNOSIS — K0889 Other specified disorders of teeth and supporting structures: Secondary | ICD-10-CM

## 2013-12-27 DIAGNOSIS — K089 Disorder of teeth and supporting structures, unspecified: Secondary | ICD-10-CM | POA: Insufficient documentation

## 2013-12-27 DIAGNOSIS — F17200 Nicotine dependence, unspecified, uncomplicated: Secondary | ICD-10-CM | POA: Insufficient documentation

## 2013-12-27 MED ORDER — TRAMADOL HCL 50 MG PO TABS: 50.0000 mg | ORAL_TABLET | Freq: Four times a day (QID) | ORAL | Status: DC | PRN

## 2013-12-27 MED ORDER — AMOXICILLIN 500 MG PO CAPS: 500.0000 mg | ORAL_CAPSULE | Freq: Three times a day (TID) | ORAL | Status: DC

## 2013-12-27 NOTE — ED Notes (Signed)
Pt c/o toothache x 1 week.

## 2013-12-27 NOTE — ED Notes (Signed)
Patient with no complaints at this time. Respirations even and unlabored. Skin warm/dry. Discharge instructions reviewed with patient at this time. Patient given opportunity to voice concerns/ask questions. Patient discharged at this time and left Emergency Department with steady gait.   

## 2013-12-30 NOTE — ED Provider Notes (Signed)
CSN: [id number]     Arrival date & time 12/27/13  1301 History   First MD Initiated Contact with Patient 12/27/13 1421     Chief Complaint  Patient presents with  . Dental Pain   (Consider location/radiation/quality/duration/timing/severity/associated sxs/prior Treatment) HPI Comments: [name] is a 23 y.o. Female presenting with a 1 week history of dental pain and gingival swelling.   She denies recent injury to the tooth.  There has been no fevers,  Chills, nausea or vomiting, also no complaint of difficulty swallowing,  Although chewing makes pain worse.  The patient has tried naproxen without relief of symptoms.      The history is provided by the patient.    History reviewed. No pertinent past medical history. Past Surgical History  Procedure Laterality Date  . Tonsillectomy     No family history on file. History  Substance Use Topics  . Smoking status: Current Every Day Smoker -- 0.50 packs/day    Types: Cigarettes  . Smokeless tobacco: Not on file  . Alcohol Use: No   OB History   Grav Para Term Preterm Abortions TAB SAB Ect Mult Living                 Review of Systems  Constitutional: Negative for fever.  HENT: Positive for dental problem. Negative for facial swelling and sore throat.   Respiratory: Negative for shortness of breath.   Musculoskeletal: Negative for neck pain and neck stiffness.    Allergies  Other  Home Medications   Current Outpatient Rx  Name  Route  Sig  Dispense  Refill  . medroxyPROGESTERone (DEPO-PROVERA) 150 MG/ML injection   Intramuscular   Inject 150 mg into the muscle every 3 (three) months.         . naproxen sodium (ANAPROX) 220 MG tablet   Oral   Take 220 mg by mouth daily as needed (pain).         Marland Kitchen amoxicillin (AMOXIL) 500 MG capsule   Oral   Take 1 capsule (500 mg total) by mouth 3 (three) times daily.   30 capsule   0   . traMADol (ULTRAM) 50 MG tablet   Oral   Take 1 tablet (50 mg total) by mouth  every 6 (six) hours as needed.   20 tablet   0    BP 126/72  Pulse 102  Temp(Src) 98.4 F (36.9 C) (Oral)  Resp 18  Ht 5' (1.524 m)  Wt 100 lb (45.36 kg)  BMI 19.53 kg/m2  SpO2 100% Physical Exam  Constitutional: She is oriented to person, place, and time. She appears well-developed and well-nourished. No distress.  HENT:  Head: Normocephalic and atraumatic.  Right Ear: Tympanic membrane and external ear normal.  Left Ear: Tympanic membrane and external ear normal.  Mouth/Throat: Uvula is midline, oropharynx is clear and moist and mucous membranes are normal. No oral lesions. No trismus in the jaw. No dental abscesses.    Eyes: Conjunctivae are normal.  Neck: Normal range of motion. Neck supple.  Cardiovascular: Normal rate and normal heart sounds.   Pulmonary/Chest: Effort normal.  Abdominal: She exhibits no distension.  Musculoskeletal: Normal range of motion.  Lymphadenopathy:    She has no cervical adenopathy.  Neurological: She is alert and oriented to person, place, and time.  Skin: Skin is warm and dry. No erythema.  Psychiatric: She has a normal mood and affect.    ED Course  Procedures (including critical care time) Labs Review  Labs Reviewed - No data to display Imaging Review No results found.  EKG Interpretation   None       MDM   1. Pain, dental    Patient with dental pain, no gingival erythema or edema.  Possible early infection/abscess .  She was prescribed amoxil, advised to hold this script and take only if gingival erythema or edema develop, tramadol.  Dental referrals given.    Burgess AmorJulie Jaxyn Rout, PA-C 12/30/13 2354

## 2014-01-02 NOTE — ED Provider Notes (Signed)
Medical screening examination/treatment/procedure(s) were performed by non-physician practitioner and as supervising physician I was immediately available for consultation/collaboration.  EKG Interpretation   None         Charles B. Sheldon, MD 01/02/14 0837 

## 2014-11-22 ENCOUNTER — Encounter (HOSPITAL_BASED_OUTPATIENT_CLINIC_OR_DEPARTMENT_OTHER): Payer: Self-pay

## 2014-11-22 ENCOUNTER — Emergency Department (HOSPITAL_BASED_OUTPATIENT_CLINIC_OR_DEPARTMENT_OTHER)
Admission: EM | Admit: 2014-11-22 | Discharge: 2014-11-22 | Disposition: A | Discharge status: Home / Self Care | Payer: Medicaid Other | Attending: Emergency Medicine | Admitting: Emergency Medicine

## 2014-11-22 DIAGNOSIS — K029 Dental caries, unspecified: Secondary | ICD-10-CM | POA: Insufficient documentation

## 2014-11-22 DIAGNOSIS — K088 Other specified disorders of teeth and supporting structures: Secondary | ICD-10-CM | POA: Diagnosis present

## 2014-11-22 DIAGNOSIS — K0889 Other specified disorders of teeth and supporting structures: Secondary | ICD-10-CM

## 2014-11-22 DIAGNOSIS — Z720 Tobacco use: Secondary | ICD-10-CM | POA: Diagnosis not present

## 2014-11-22 MED ORDER — PENICILLIN V POTASSIUM 500 MG PO TABS: 500.0000 mg | ORAL_TABLET | Freq: Four times a day (QID) | ORAL | Status: AC

## 2014-11-22 MED ORDER — HYDROCODONE-ACETAMINOPHEN 5-325 MG PO TABS
1.0000 | ORAL_TABLET | Freq: Once | ORAL | Status: AC
  Administered 2014-11-22: 1 via ORAL
  Filled 2014-11-22: qty 1

## 2014-11-22 MED ORDER — HYDROCODONE-ACETAMINOPHEN 5-325 MG PO TABS: 1.0000 | ORAL_TABLET | ORAL | Status: DC | PRN

## 2014-11-22 NOTE — Discharge Instructions (Signed)
Please follow the directions provided.  It is very important to establish care with a dentist to fix the tooth causing pain.  Use the resource guide below to find a dentist that can see you as soon as possible.  Take the antibiotic as directed until it is all gone.  Take the pain medications as needed, but do not drive while taking.  Don't hesitate to return for new, worsening or concerning symptoms.     SEEK IMMEDIATE MEDICAL CARE IF:  You have a fever.  You develop redness and swelling of your face, jaw, or neck.  You are unable to open your mouth.  You have severe pain uncontrolled by pain medicine.   Emergency Department Resource Guide 1) Find a Doctor and Pay Out of Pocket Although you won't have to find out who is covered by your insurance plan, it is a good idea to ask around and get recommendations. You will then need to call the office and see if the doctor you have chosen will accept you as a new patient and what types of options they offer for patients who are self-pay. Some doctors offer discounts or will set up payment plans for their patients who do not have insurance, but you will need to ask so you aren't surprised when you get to your appointment.  2) Contact Your Local Health Department Not all health departments have doctors that can see patients for sick visits, but many do, so it is worth a call to see if yours does. If you don't know where your local health department is, you can check in your phone book. The CDC also has a tool to help you locate your state's health department, and many state websites also have listings of all of their local health departments.  3) Find a Walk-in Clinic If your illness is not likely to be very severe or complicated, you may want to try a walk in clinic. These are popping up all over the country in pharmacies, drugstores, and shopping centers. They're usually staffed by nurse practitioners or physician assistants that have been trained to  treat common illnesses and complaints. They're usually fairly quick and inexpensive. However, if you have serious medical issues or chronic medical problems, these are probably not your best option.  No Primary Care Doctor: - Call Health Connect at  [phone] - they can help you locate a primary care doctor that  accepts your insurance, provides certain services, etc. - Physician Referral Service- [phone]  Chronic Pain Problems: Organization         Address  Phone   Notes  [facility]  [phone] Patients need to be referred by their primary care doctor.   Medication Assistance: Organization         Address  Phone   Notes  [facility] [address] [place], [place] [phone]) [phone] --Must be a resident of [facility] -- Must have NO insurance coverage whatsoever (no Medicaid/ Medicare, etc.) -- The pt. MUST have a primary care doctor that directs their care regularly and follows them in the community   MedAssist  [phone]   [employer]  [phone]    Agencies that provide inexpensive medical care: Organization         Address  Phone   Notes  [facility]  [phone]   [facility]    [phone]) [phone]   [facility] [address] [place], [place] [address] [phone]   [facility] [address] [place] [place] [phone]   Planned Parenthood    [phone]   [facility]    [phone]   [facility]  [address], [place] Phone:  [phone], Fax:  [phone] Hours of Operation:  9 am - 6 pm, M-F.  Also accepts Medicaid/Medicare and self-pay.  [facility]  [address], [place] Phone: [phone], Fax: [phone]. Hours of Operation:  8:30 am - 5:30 pm, M-F.  Also accepts Medicaid and self-pay.  [facility]  [place] [address] [place] Phone: [phone]   [facility] [address] Natasha Bence [place], [place] [phone], Ext. [phone] Mondays & Thursdays: 7-9 AM.  First 15 patients are seen on a first come, first serve basis.    Medicaid-accepting [facility] Providers:  Organization         Address  Phone   Notes  [facility] [address], [place] [phone] Also accepts self-pay patients.  [facility] [address] Laurell Josephs [place], [place]  [phone]   [facility] [address], [place] [phone]   [facility] [address] [place] [phone]   Renaye Rakers [address], [place]   [phone] Only accepts [place] patients after they have their name applied to their card.   Self-Pay (no insurance) in [facility]:  Organization         Address  Phone   Notes  Sickle Cell Patients, [facility] [address] [place], [place] [phone]   [facility] [address] [place], [place] [phone]   [facility]  [address], [place] [phone]   [facility]/Dr. Osei-Bonsu  [address], [place] or [address], [place] [phone] Phone number for both [place] and [place] locations is the same.  [facility] and [facility] [address], [place] [phone]   [facility] [address], [place] or [address] [phone]   [facility] [address] [place] [phone], phone; [phone], fax Sees patients 1st and 3rd Saturday of every month.  Must not qualify for public or private insurance (i.e. Medicaid, Medicare, [place] Health Choice, Veterans' Benefits)  Household income should be no more than 200%  of the poverty level The clinic cannot treat you if you are pregnant or think you are pregnant  Sexually transmitted diseases are not treated at the clinic.    Dental Care: Organization         Address  Phone  Notes  [facility] [address] [place], [place] [phone] Accepts children up to age 35 who are enrolled in [place] or [place] Health Choice; pregnant women with a Medicaid card; and children who have applied for Medicaid or [place] Health Choice, but were declined, whose parents can pay a reduced fee at time of service.  [facility]  [address], [place] [phone] Accepts children up to age 32 who are enrolled in  Medicaid or [place] Health Choice; pregnant women with a Medicaid card; and children who have applied for Medicaid or [place] Health Choice, but were declined, whose parents can pay a reduced fee at time of service.  [facility]  [address], [place] [phone] Patients are seen by appointment only. Walk-ins are not accepted. [facility] will see patients 24 years of age and older. Monday - Tuesday (8am-5pm) Most Wednesdays (8:30-5pm) $30 per visit, cash only  [facility]  [address], [facility] [phone] Patients are seen by appointment only. Walk-ins are not accepted. [facility] will see patients 24 years of age and older. One Wednesday Evening (Monthly: Volunteer Based).  $30 per visit, cash only  [employer] [facility]  [phone]) [phone] for adults; Children under age 124, call Graduate Pediatric Dentistry at [phone]. Children aged 704-14, please call [phone]) [phone] to request a pediatric application.  Dental services are provided in all areas of dental care including fillings, crowns and bridges, complete and partial dentures, implants, gum treatment, root canals, and  extractions. Preventive care is also provided. Treatment is provided to both adults and children. Patients are selected via a lottery and there is often a waiting list.   [facility] [address], [place]  [phone] [email or web address]   [facility] [address] [place] [address], [place], [place] [phone], Ext. [phone] Second and Fourth Thursday of each month, opens at 6:30 AM; Clinic ends at 9 AM.  Patients are seen on a first-come first-served basis, and a limited number are seen during each clinic.   [facility]  [address], [place], [place] [phone]   Eligibility Requirements You must have lived in [place], [place], or [place] counties for at least the last three months.   You cannot be eligible for state or federal sponsored National Cityhealthcare insurance, including CIGNAVeterans Administration, [place]icaid, or [employer]Medicare.   You generally cannot be eligible for healthcare insurance through your employer.    How to apply: Eligibility screenings are held every Tuesday and Wednesday afternoon from 1:00 pm until 4:00 pm. You do not need an appointment for the interview!  [facility] [address], [place], [place] [phone]   [facility]  [phone]   [facility]  [phone]   [facility]  [phone]    Behavioral Health Resources in the Community: Intensive Outpatient Programs Organization         Address  Phone  Notes  [facility] [address], [place], [place] [phone]   [facility]patient [address], [place], [place] [phone]   [facility] [address], [place], [place]  [phone]   [facility] [address],  [place], [place] [phone] or [phone]   Substance Abuse Resources Organization         Address  Phone  Notes  [facility] Services  [phone]     [facility]  [phone]   [facility]  [phone]   [facility]  [phone]   Residential & Outpatient Substance Abuse Program  [phone]   Psychological Services Organization         Address  Phone  Notes  [facility]  [phone]   [facility]  [phone]- [phone]   [facility] [address], [place] [phone]  or [phone]    Mobile Crisis Teams Organization         Address  Phone  Notes  [facility], Mobile Crisis Care Unit  [phone]   [facility]  [address] [place], [place] [phone]   [facility] De[facility]sch [address] [place] [phone]    Self-Help/Support Groups Organization         Address  Phone             Notes  [facility] [place] - variety of support groups  336- [phone] Call for more information  Narcotics Anonymous (NA), [facility] [address], [place]  2 meetings at this location   Statisticianesidential Treatment Programs Organization         Address  Phone  Notes  [facility] [address],    [place]  [phone]   [facility]  [address], [place] [phone], [place], [place] [phone]   [facility] [address], [place] [phone] Admissions: 8am-3pm M-F  [facility] [address]    [place], [place] [phone]   [facility] [address], [place], [place] [phone]   [facility] [address]  [place], [place] [phone]   [facility] - Intensive Outpatient [address], [place], [place] [phone]   [facility] ([facility] [address]  [place], [place] [phone] or [phone]   [facility] ([facility]) [address] [place], [place] [phone] Accepts Medicaid  [facility] [address]   [place] [phone] Substance Abuse/Addiction Treatment   [facility] Resources Organization         Address  Phone  Notes  [facility]  [phone]) [phone]   Angie FavaJulie Brannon, PhD [address], [address] [place], [place]   [phone] or [phone]   [facility]   [address] [place], [place] [phone]   [facility] [address], [place], [place] [phone] Insurance/Medicaid/sponsorship through [facility]  [facility] Families [address]                                    [place], [place] [phone] Therapy/tele-psych/case  [facility] [address]   [place], [place] [phone]    Dr. Lolly MustacheArfeen  [phone]) [phone]   [facility]  United Way [facility] 1) [address], [place] 2) [address], [place] 3)  [address], [place] [phone]  [phone]   [facility] Child Abuse Hotline [phone] or [phone](336) [phone] (After Hours)

## 2014-11-22 NOTE — ED Notes (Signed)
Patient here with left lower tooth pain x 2 days, reports that it is broken. Pain for same

## 2014-11-22 NOTE — ED Provider Notes (Signed)
CSN: [id number]     Arrival date & time 11/22/14  1204 History   First MD Initiated Contact with Patient 11/22/14 1418     Chief Complaint  Patient presents with  . Dental Pain   (Consider location/radiation/quality/duration/timing/severity/associated sxs/prior Treatment) HPI  [name] is a 23 yo female presenting with tooth pain x 2 days. She reports breaking the tooth in the past but pain has now become worse.  She describes the pain as throbbing and constant and  rates it 8/10.  She denies fevers, chills, nausea or vomiting.     History reviewed. No pertinent past medical history. Past Surgical History  Procedure Laterality Date  . Tonsillectomy     No family history on file. History  Substance Use Topics  . Smoking status: Current Every Day Smoker -- 0.50 packs/day    Types: Cigarettes  . Smokeless tobacco: Not on file  . Alcohol Use: No   OB History    No data available     Review of Systems  Constitutional: Negative for fever and chills.  Respiratory: Negative for cough.   Cardiovascular: Negative for leg swelling.  Gastrointestinal: Negative for nausea and vomiting.  Skin: Negative for rash.  Neurological: Negative for headaches.    Allergies  Other  Home Medications   Prior to Admission medications   Medication Sig Start Date End Date Taking? Authorizing Provider  medroxyPROGESTERone (DEPO-PROVERA) 150 MG/ML injection Inject 150 mg into the muscle every 3 (three) months.    Historical Provider, MD   BP 121/71 mmHg  Pulse 59  Temp(Src) 98.9 F (37.2 C) (Oral)  Resp 18  Wt 100 lb (45.36 kg)  SpO2 99%  LMP 11/18/2014 Physical Exam  Constitutional: She appears well-developed and well-nourished. No distress.  HENT:  Head: Normocephalic and atraumatic.  Mouth/Throat: Oropharynx is clear and moist. No trismus in the jaw. Abnormal dentition. Dental caries present. No dental abscesses or uvula swelling. No oropharyngeal exudate.    Eyes:  Conjunctivae are normal.  Neck: Neck supple. No thyromegaly present.  Cardiovascular: Normal rate and regular rhythm.   Pulmonary/Chest: Effort normal and breath sounds normal. No respiratory distress.  Lymphadenopathy:    She has no cervical adenopathy.  Neurological: She is alert.  Skin: Skin is warm and dry. No rash noted. She is not diaphoretic.  Nursing note and vitals reviewed.   ED Course  Procedures (including critical care time) Labs Review Labs Reviewed - No data to display  Imaging Review No results found.   EKG Interpretation None      MDM   Final diagnoses:  Pain, dental   23 yo female with toothache.  There is no gross abscess and no concern for Ludwig's angina or spread of infection.  Will treat with penicillin and pain medicine.  Resources provided to patient to follow-up with dentist.     Ceasar MonsFiled Vitals:   11/22/14 1215  BP: 121/71  Pulse: 59  Temp: 98.9 F (37.2 C)  TempSrc: Oral  Resp: 18  Weight: 100 lb (45.36 kg)  SpO2: 99%   Meds given in ED:  Medications  HYDROcodone-acetaminophen (NORCO/VICODIN) 5-325 MG per tablet 1 tablet (1 tablet Oral Given 11/22/14 1430)    Discharge Medication List as of 11/22/2014  2:29 PM    START taking these medications   Details  HYDROcodone-acetaminophen (NORCO/VICODIN) 5-325 MG per tablet Take 1-2 tablets by mouth every 4 (four) hours as needed for moderate pain or severe pain., Starting 11/22/2014, Until Discontinued, Print  penicillin v potassium (VEETID) 500 MG tablet Take 1 tablet (500 mg total) by mouth 4 (four) times daily., Starting 11/22/2014, Until Thu 11/29/14, Print           Harle BattiestElizabeth Aleanna Menge, NP 11/23/14 [id number]  Arby BarretteMarcy Pfeiffer, MD 11/23/14 [phone]

## 2015-02-26 ENCOUNTER — Emergency Department (HOSPITAL_BASED_OUTPATIENT_CLINIC_OR_DEPARTMENT_OTHER)
Admission: EM | Admit: 2015-02-26 | Discharge: 2015-02-26 | Disposition: A | Discharge status: Home / Self Care | Payer: Medicaid Other | Attending: Emergency Medicine | Admitting: Emergency Medicine

## 2015-02-26 ENCOUNTER — Encounter (HOSPITAL_BASED_OUTPATIENT_CLINIC_OR_DEPARTMENT_OTHER): Payer: Self-pay | Admitting: Emergency Medicine

## 2015-02-26 DIAGNOSIS — G8929 Other chronic pain: Secondary | ICD-10-CM | POA: Diagnosis not present

## 2015-02-26 DIAGNOSIS — Z720 Tobacco use: Secondary | ICD-10-CM | POA: Diagnosis not present

## 2015-02-26 DIAGNOSIS — K088 Other specified disorders of teeth and supporting structures: Secondary | ICD-10-CM | POA: Insufficient documentation

## 2015-02-26 DIAGNOSIS — K0889 Other specified disorders of teeth and supporting structures: Secondary | ICD-10-CM

## 2015-02-26 MED ORDER — PENICILLIN V POTASSIUM 500 MG PO TABS: 500.0000 mg | ORAL_TABLET | Freq: Three times a day (TID) | ORAL | Status: AC

## 2015-02-26 MED ORDER — ACETAMINOPHEN 325 MG PO TABS
650.0000 mg | ORAL_TABLET | Freq: Once | ORAL | Status: AC
  Administered 2015-02-26: 650 mg via ORAL
  Filled 2015-02-26: qty 2

## 2015-02-26 NOTE — ED Provider Notes (Signed)
CSN: [id number]     Arrival date & time 02/26/15  1012 History   First MD Initiated Contact with Patient 02/26/15 1128     Chief Complaint  Patient presents with  . Dental Pain     (Consider location/radiation/quality/duration/timing/severity/associated sxs/prior Treatment) HPI Complains of dental pain at right lower molar for "a few months." No other associated symptoms. She's been treating herself with Tylenol and ibuprofen without relief. No fever No other associated symptoms No past medical history on file. Past Surgical History  Procedure Laterality Date  . Tonsillectomy     No family history on file. History  Substance Use Topics  . Smoking status: Current Every Day Smoker -- 0.50 packs/day    Types: Cigarettes  . Smokeless tobacco: Not on file  . Alcohol Use: No   OB History    No data available     Review of Systems  Constitutional: Negative.   HENT: Positive for dental problem.   Respiratory: Negative.   Allergic/Immunologic: Negative for immunocompromised state.      Allergies  Dextromethorphan  Home Medications   Prior to Admission medications   Not on File   BP 115/66 mmHg  Pulse 74  Temp(Src) 98.4 F (36.9 C) (Oral)  Resp 16  Ht 5' (1.524 m)  Wt 89 lb 11.2 oz (40.688 kg)  BMI 17.52 kg/m2  SpO2 100%  LMP 01/31/2015 Physical Exam  Constitutional: She appears well-developed and well-nourished. No distress.  HENT:  Head: Normocephalic and atraumatic.  Right Ear: External ear normal.  Left Ear: External ear normal.  Mouth/Throat: No oropharyngeal exudate.  Oropharynx normal. Tooth #31 tender. No surrounding  fluctuance or swelling of the gingiva no trismus  Eyes: Conjunctivae are normal. Pupils are equal, round, and reactive to light.  Neck: Neck supple. No tracheal deviation present. No thyromegaly present.  Cardiovascular: Normal rate.   Pulmonary/Chest: Effort normal.  Abdominal: She exhibits no distension.  Musculoskeletal: Normal range  of motion. She exhibits no edema or tenderness.  Lymphadenopathy:    She has no cervical adenopathy.  Neurological: She is alert. Coordination normal.  Skin: Skin is warm and dry. No rash noted.  Psychiatric: She has a normal mood and affect.  Nursing note and vitals reviewed.   ED Course  Procedures (including critical care time) Labs Review Labs Reviewed - No data to display  Imaging Review No results found.   EKG Interpretation None      MDM  Dental pain is chronic. Prescription penicillin. Tylenol or Advil for pain. She has an appointment with the dentist next week Final diagnoses:  None   Diagnosis chronic dental pain     Doug SouSam Nefi Musich, MD 02/26/15 1139

## 2015-02-26 NOTE — Discharge Instructions (Signed)
Dental Pain Take Tylenol or Advil as directed for pain. Keep your scheduled appointment with your dentist next week A tooth ache may be caused by cavities (tooth decay). Cavities expose the nerve of the tooth to air and hot or cold temperatures. It may come from an infection or abscess (also called a boil or furuncle) around your tooth. It is also often caused by dental caries (tooth decay). This causes the pain you are having. DIAGNOSIS  Your caregiver can diagnose this problem by exam. TREATMENT   If caused by an infection, it may be treated with medications which kill germs (antibiotics) and pain medications as prescribed by your caregiver. Take medications as directed.  Only take over-the-counter or prescription medicines for pain, discomfort, or fever as directed by your caregiver.  Whether the tooth ache today is caused by infection or dental disease, you should see your dentist as soon as possible for further care. SEEK MEDICAL CARE IF: The exam and treatment you received today has been provided on an emergency basis only. This is not a substitute for complete medical or dental care. If your problem worsens or new problems (symptoms) appear, and you are unable to meet with your dentist, call or return to this location. SEEK IMMEDIATE MEDICAL CARE IF:   You have a fever.  You develop redness and swelling of your face, jaw, or neck.  You are unable to open your mouth.  You have severe pain uncontrolled by pain medicine. MAKE SURE YOU:   Understand these instructions.  Will watch your condition.  Will get help right away if you are not doing well or get worse. Document Released: 12/14/2005 Document Revised: 03/07/2012 Document Reviewed: 08/01/2008 [facility] Patient Information 2015 [place], [place]. This information is not intended to replace advice given to you by your health care provider. Make sure you discuss any questions you have with your health care provider.

## 2015-02-26 NOTE — ED Notes (Signed)
Pt having right lower dental pain.  Pt has appt with [facility] next Thursday.  Pt needs antibiotics and pain medication.

## 2015-02-26 NOTE — ED Notes (Signed)
Directed to pharmacy to pick up meds 

## 2015-12-29 NOTE — L&D Delivery Note (Signed)
Delivery Note SROM with clear fluid at 0700. At 7:05 AM a viable female was delivered via Vaginal, Spontaneous Delivery ROA.  APGAR: 8,9 ; weight: pending  Placenta status:spontaneous delivery, intact. Three vessel Cord:  Anesthesia: epidural Episiotomy: None Lacerations:  1st degree with repair Suture Repair: 2.0 Est. Blood Loss (mL): 150   Mom to postpartum.  Baby to Couplet care / Skin to Skin.  Holly BiblesSamantha Madeline [name], SNM 10/16/2016, 7:34 AM  I was present for the above and agree  Cathie BeamsFran Cresenzo-Dishmon, CNM

## 2016-02-18 ENCOUNTER — Encounter: Payer: Self-pay | Admitting: Family Medicine

## 2016-02-18 ENCOUNTER — Ambulatory Visit (INDEPENDENT_AMBULATORY_CARE_PROVIDER_SITE_OTHER): Payer: Medicaid Other | Admitting: Family Medicine

## 2016-02-18 VITALS — BP 110/68 | HR 78 | Temp 98.0°F | Ht 61.25 in | Wt 102.4 lb

## 2016-02-18 DIAGNOSIS — Z3201 Encounter for pregnancy test, result positive: Secondary | ICD-10-CM

## 2016-02-18 DIAGNOSIS — N912 Amenorrhea, unspecified: Secondary | ICD-10-CM | POA: Diagnosis not present

## 2016-02-18 DIAGNOSIS — Z331 Pregnant state, incidental: Secondary | ICD-10-CM

## 2016-02-18 DIAGNOSIS — Z3490 Encounter for supervision of normal pregnancy, unspecified, unspecified trimester: Secondary | ICD-10-CM

## 2016-02-18 LAB — POCT URINE PREGNANCY: Preg Test, Ur: POSITIVE — AB

## 2016-02-18 NOTE — Progress Notes (Signed)
   Subjective:    Patient ID: [name], female    DOB: 01/04/1991, 24 y.o.   MRN: [id number]  HPI She is here for consult concerning possible pregnancy. She has done 3 pregnancy tests at home that were positive. Her last menstrual period was January 10.   Review of Systems     Objective:   Physical Exam Alert and in no distress. Urine pregnant test was positive      Assessment & Plan:  Amenorrhea - Plan: POCT urine pregnancy, Ambulatory referral to Obstetrics / Gynecology  IUP (intrauterine pregnancy), incidental - Plan: Ambulatory referral to Obstetrics / Gynecology she is happy with this pregnancy.

## 2016-03-03 ENCOUNTER — Encounter: Payer: Self-pay | Admitting: Obstetrics

## 2016-03-04 LAB — OB RESULTS CONSOLE ANTIBODY SCREEN: ANTIBODY SCREEN: POSITIVE

## 2016-03-04 LAB — OB RESULTS CONSOLE HIV ANTIBODY (ROUTINE TESTING): HIV: NONREACTIVE

## 2016-03-04 LAB — OB RESULTS CONSOLE GC/CHLAMYDIA
Chlamydia: NEGATIVE
Gonorrhea: NEGATIVE

## 2016-03-04 LAB — OB RESULTS CONSOLE RUBELLA ANTIBODY, IGM: Rubella: IMMUNE

## 2016-03-04 LAB — OB RESULTS CONSOLE ABO/RH: RH TYPE: POSITIVE

## 2016-03-04 LAB — OB RESULTS CONSOLE HGB/HCT, BLOOD
HEMATOCRIT: 34 %
HEMOGLOBIN: 11.3 g/dL

## 2016-03-04 LAB — OB RESULTS CONSOLE RPR: RPR: NONREACTIVE

## 2016-03-04 LAB — OB RESULTS CONSOLE HEPATITIS B SURFACE ANTIGEN: Hepatitis B Surface Ag: NEGATIVE

## 2016-03-10 MED FILL — BUPRENORPHINE 8 MG TAB SL: 8 | 7 days supply | Qty: 7 | Fill #0

## 2016-03-17 MED FILL — BUPRENORPHINE 8 MG TAB SL: 8 | 7 days supply | Qty: 11 | Fill #0

## 2016-03-24 MED FILL — BUPRENORPHINE 8 MG TAB SL: 8 | 7 days supply | Qty: 11 | Fill #0

## 2016-03-31 MED FILL — BUPRENORPHINE 8 MG TAB SL: 8 | 7 days supply | Qty: 11 | Fill #0

## 2016-04-07 MED FILL — BUPRENORPHINE 8 MG TAB SL: 8 | 7 days supply | Qty: 11 | Fill #0

## 2016-04-14 MED FILL — BUPRENORPHINE 8 MG TAB SL: 8 | 7 days supply | Qty: 14 | Fill #0

## 2016-04-21 MED FILL — BUPRENORPHINE 8 MG TAB SL: 8 | 7 days supply | Qty: 14 | Fill #0

## 2016-04-28 MED FILL — BUPRENORPHINE 8 MG TAB SL: 8 | 7 days supply | Qty: 14 | Fill #0

## 2016-05-05 MED FILL — BUPRENORPHINE 8 MG TAB SL: 8 | 7 days supply | Qty: 14 | Fill #0

## 2016-05-12 MED FILL — BUPRENORPHINE 8 MG TAB SL: 8 | 7 days supply | Qty: 14 | Fill #0

## 2016-05-19 MED FILL — BUPRENORPHINE 8 MG TAB SL: 8 | 7 days supply | Qty: 14 | Fill #0

## 2016-05-26 ENCOUNTER — Other Ambulatory Visit (HOSPITAL_COMMUNITY): Payer: Self-pay | Admitting: Obstetrics

## 2016-05-26 DIAGNOSIS — Z3A21 21 weeks gestation of pregnancy: Secondary | ICD-10-CM

## 2016-05-26 DIAGNOSIS — Z3689 Encounter for other specified antenatal screening: Secondary | ICD-10-CM

## 2016-05-26 MED FILL — BUPRENORPHINE 8 MG TAB SL: 8 | 7 days supply | Qty: 14 | Fill #0

## 2016-06-01 ENCOUNTER — Ambulatory Visit (HOSPITAL_COMMUNITY)
Admission: RE | Admit: 2016-06-01 | Discharge: 2016-06-01 | Disposition: A | Discharge status: Home / Self Care | Payer: Medicaid Other | Source: Ambulatory Visit | Attending: Obstetrics | Admitting: Obstetrics

## 2016-06-01 ENCOUNTER — Other Ambulatory Visit (HOSPITAL_COMMUNITY): Payer: Self-pay | Admitting: Obstetrics

## 2016-06-01 ENCOUNTER — Encounter (HOSPITAL_COMMUNITY): Payer: Self-pay

## 2016-06-01 DIAGNOSIS — Z3A20 20 weeks gestation of pregnancy: Secondary | ICD-10-CM | POA: Diagnosis not present

## 2016-06-01 DIAGNOSIS — Z36 Encounter for antenatal screening of mother: Secondary | ICD-10-CM | POA: Insufficient documentation

## 2016-06-01 DIAGNOSIS — Z3689 Encounter for other specified antenatal screening: Secondary | ICD-10-CM

## 2016-06-01 DIAGNOSIS — Z3A21 21 weeks gestation of pregnancy: Secondary | ICD-10-CM

## 2016-06-02 MED FILL — BUPRENORPHINE 8 MG TAB SL: 8 | 14 days supply | Qty: 28 | Fill #0

## 2016-06-16 MED FILL — BUPRENORPHINE 8 MG TAB SL: 8 | 14 days supply | Qty: 28 | Fill #0

## 2016-06-29 MED FILL — BUPRENORPHINE 8 MG TAB SL: 8 | 14 days supply | Qty: 35 | Fill #0

## 2016-07-13 MED FILL — BUPRENORPHINE 8 MG TABLET S: 8 | 14 days supply | Qty: 35 | Fill #0

## 2016-07-18 ENCOUNTER — Inpatient Hospital Stay (HOSPITAL_COMMUNITY)
Admission: AD | Admit: 2016-07-18 | Discharge: 2016-07-18 | Disposition: A | Discharge status: Home / Self Care | Payer: Medicaid Other | Source: Ambulatory Visit | Attending: Obstetrics and Gynecology | Admitting: Obstetrics and Gynecology

## 2016-07-18 ENCOUNTER — Encounter (HOSPITAL_COMMUNITY): Payer: Self-pay

## 2016-07-18 ENCOUNTER — Emergency Department (HOSPITAL_COMMUNITY)
Admission: EM | Admit: 2016-07-18 | Discharge: 2016-07-18 | Disposition: A | Discharge status: Other Acute Inpatient Hospital | Payer: Medicaid Other | Attending: Emergency Medicine | Admitting: Emergency Medicine

## 2016-07-18 ENCOUNTER — Encounter (HOSPITAL_COMMUNITY): Payer: Self-pay | Admitting: Emergency Medicine

## 2016-07-18 DIAGNOSIS — Y92410 Unspecified street and highway as the place of occurrence of the external cause: Secondary | ICD-10-CM | POA: Diagnosis not present

## 2016-07-18 DIAGNOSIS — Z3A27 27 weeks gestation of pregnancy: Secondary | ICD-10-CM | POA: Insufficient documentation

## 2016-07-18 DIAGNOSIS — M545 Low back pain, unspecified: Secondary | ICD-10-CM

## 2016-07-18 DIAGNOSIS — Y999 Unspecified external cause status: Secondary | ICD-10-CM | POA: Insufficient documentation

## 2016-07-18 DIAGNOSIS — O2692 Pregnancy related conditions, unspecified, second trimester: Secondary | ICD-10-CM | POA: Insufficient documentation

## 2016-07-18 DIAGNOSIS — Z79899 Other long term (current) drug therapy: Secondary | ICD-10-CM | POA: Diagnosis not present

## 2016-07-18 DIAGNOSIS — O99332 Smoking (tobacco) complicating pregnancy, second trimester: Secondary | ICD-10-CM | POA: Diagnosis not present

## 2016-07-18 DIAGNOSIS — F17210 Nicotine dependence, cigarettes, uncomplicated: Secondary | ICD-10-CM

## 2016-07-18 DIAGNOSIS — Y939 Activity, unspecified: Secondary | ICD-10-CM | POA: Diagnosis not present

## 2016-07-18 DIAGNOSIS — O9A212 Injury, poisoning and certain other consequences of external causes complicating pregnancy, second trimester: Secondary | ICD-10-CM | POA: Insufficient documentation

## 2016-07-18 LAB — ABO/RH: ABO/RH(D): A POS

## 2016-07-18 MED ORDER — ACETAMINOPHEN 500 MG PO TABS
1000.0000 mg | ORAL_TABLET | Freq: Once | ORAL | Status: AC
  Administered 2016-07-18: 1000 mg via ORAL
  Filled 2016-07-18: qty 2

## 2016-07-18 NOTE — Discharge Instructions (Signed)
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
## 2016-07-18 NOTE — Discharge Instructions (Signed)
Fetal Movement Counts °Patient Name: __________________________________________________ Patient Due Date: ____________________ °Performing a fetal movement count is highly recommended in high-risk pregnancies, but it is good for every pregnant woman to do. Your health care provider may ask you to start counting fetal movements at 28 weeks of the pregnancy. Fetal movements often increase: °· After eating a full meal. °· After physical activity. °· After eating or drinking something sweet or cold. °· At rest. °Pay attention to when you feel the baby is most active. This will help you notice a pattern of your baby's sleep and wake cycles and what factors contribute to an increase in fetal movement. It is important to perform a fetal movement count at the same time each day when your baby is normally most active.  °HOW TO COUNT FETAL MOVEMENTS °1. Find a quiet and comfortable area to sit or lie down on your left side. Lying on your left side provides the best blood and oxygen circulation to your baby. °2. Write down the day and time on a sheet of paper or in a journal. °3. Start counting kicks, flutters, swishes, rolls, or jabs in a 2-hour period. You should feel at least 10 movements within 2 hours. °4. If you do not feel 10 movements in 2 hours, wait 2-3 hours and count again. Look for a change in the pattern or not enough counts in 2 hours. °SEEK MEDICAL CARE IF: °· You feel less than 10 counts in 2 hours, tried twice. °· There is no movement in over an hour. °· The pattern is changing or taking longer each day to reach 10 counts in 2 hours. °· You feel the baby is not moving as he or she usually does. °Date: ____________ Movements: ____________ Start time: ____________ Finish time: ____________  °Date: ____________ Movements: ____________ Start time: ____________ Finish time: ____________ °Date: ____________ Movements: ____________ Start time: ____________ Finish time: ____________ °Date: ____________ Movements:  ____________ Start time: ____________ Finish time: ____________ °Date: ____________ Movements: ____________ Start time: ____________ Finish time: ____________ °Date: ____________ Movements: ____________ Start time: ____________ Finish time: ____________ °Date: ____________ Movements: ____________ Start time: ____________ Finish time: ____________ °Date: ____________ Movements: ____________ Start time: ____________ Finish time: ____________  °Date: ____________ Movements: ____________ Start time: ____________ Finish time: ____________ °Date: ____________ Movements: ____________ Start time: ____________ Finish time: ____________ °Date: ____________ Movements: ____________ Start time: ____________ Finish time: ____________ °Date: ____________ Movements: ____________ Start time: ____________ Finish time: ____________ °Date: ____________ Movements: ____________ Start time: ____________ Finish time: ____________ °Date: ____________ Movements: ____________ Start time: ____________ Finish time: ____________ °Date: ____________ Movements: ____________ Start time: ____________ Finish time: ____________  °Date: ____________ Movements: ____________ Start time: ____________ Finish time: ____________ °Date: ____________ Movements: ____________ Start time: ____________ Finish time: ____________ °Date: ____________ Movements: ____________ Start time: ____________ Finish time: ____________ °Date: ____________ Movements: ____________ Start time: ____________ Finish time: ____________ °Date: ____________ Movements: ____________ Start time: ____________ Finish time: ____________ °Date: ____________ Movements: ____________ Start time: ____________ Finish time: ____________ °Date: ____________ Movements: ____________ Start time: ____________ Finish time: ____________  °Date: ____________ Movements: ____________ Start time: ____________ Finish time: ____________ °Date: ____________ Movements: ____________ Start time: ____________ Finish  time: ____________ °Date: ____________ Movements: ____________ Start time: ____________ Finish time: ____________ °Date: ____________ Movements: ____________ Start time: ____________ Finish time: ____________ °Date: ____________ Movements: ____________ Start time: ____________ Finish time: ____________ °Date: ____________ Movements: ____________ Start time: ____________ Finish time: ____________ °Date: ____________ Movements: ____________ Start time: ____________ Finish time: ____________  °Date: ____________ Movements: ____________ Start time: ____________ Finish   time: ____________ °Date: ____________ Movements: ____________ Start time: ____________ Finish time: ____________ °Date: ____________ Movements: ____________ Start time: ____________ Finish time: ____________ °Date: ____________ Movements: ____________ Start time: ____________ Finish time: ____________ °Date: ____________ Movements: ____________ Start time: ____________ Finish time: ____________ °Date: ____________ Movements: ____________ Start time: ____________ Finish time: ____________ °Date: ____________ Movements: ____________ Start time: ____________ Finish time: ____________  °Date: ____________ Movements: ____________ Start time: ____________ Finish time: ____________ °Date: ____________ Movements: ____________ Start time: ____________ Finish time: ____________ °Date: ____________ Movements: ____________ Start time: ____________ Finish time: ____________ °Date: ____________ Movements: ____________ Start time: ____________ Finish time: ____________ °Date: ____________ Movements: ____________ Start time: ____________ Finish time: ____________ °Date: ____________ Movements: ____________ Start time: ____________ Finish time: ____________ °Date: ____________ Movements: ____________ Start time: ____________ Finish time: ____________  °Date: ____________ Movements: ____________ Start time: ____________ Finish time: ____________ °Date: ____________  Movements: ____________ Start time: ____________ Finish time: ____________ °Date: ____________ Movements: ____________ Start time: ____________ Finish time: ____________ °Date: ____________ Movements: ____________ Start time: ____________ Finish time: ____________ °Date: ____________ Movements: ____________ Start time: ____________ Finish time: ____________ °Date: ____________ Movements: ____________ Start time: ____________ Finish time: ____________ °Date: ____________ Movements: ____________ Start time: ____________ Finish time: ____________  °Date: ____________ Movements: ____________ Start time: ____________ Finish time: ____________ °Date: ____________ Movements: ____________ Start time: ____________ Finish time: ____________ °Date: ____________ Movements: ____________ Start time: ____________ Finish time: ____________ °Date: ____________ Movements: ____________ Start time: ____________ Finish time: ____________ °Date: ____________ Movements: ____________ Start time: ____________ Finish time: ____________ °Date: ____________ Movements: ____________ Start time: ____________ Finish time: ____________ °  °This information is not intended to replace advice given to you by your health care provider. Make sure you discuss any questions you have with your health care provider. °  °Document Released: 01/13/2007 Document Revised: 01/04/2015 Document Reviewed: 10/10/2012 °[employer] Interactive Patient Education ©2016 [employer] °Braxton Hicks Contractions °Contractions of the uterus can occur throughout pregnancy. Contractions are not always a sign that you are in labor.  °WHAT ARE BRAXTON HICKS CONTRACTIONS?  °Contractions that occur before labor are called Braxton Hicks contractions, or false labor. Toward the end of pregnancy (32-34 weeks), these contractions can develop more often and may become more forceful. This is not true labor because these contractions do not result in opening (dilatation) and thinning of  the cervix. They are sometimes difficult to tell apart from true labor because these contractions can be forceful and people have different pain tolerances. You should not feel embarrassed if you go to the hospital with false labor. Sometimes, the only way to tell if you are in true labor is for your health care provider to look for changes in the cervix. °If there are no prenatal problems or other health problems associated with the pregnancy, it is completely safe to be sent home with false labor and await the onset of true labor. °HOW CAN YOU TELL THE DIFFERENCE BETWEEN TRUE AND FALSE LABOR? °False Labor °· The contractions of false labor are usually shorter and not as hard as those of true labor.   °· The contractions are usually irregular.   °· The contractions are often felt in the front of the lower abdomen and in the groin.   °· The contractions may go away when you walk around or change positions while lying down.   °· The contractions get weaker and are shorter lasting as time goes on.   °· The contractions do not usually become progressively stronger, regular, and closer together as with true labor.   °True Labor °5. Contractions in true   labor last 30-70 seconds, become very regular, usually become more intense, and increase in frequency.   °6. The contractions do not go away with walking.   °7. The discomfort is usually felt in the top of the uterus and spreads to the lower abdomen and low back.   °8. True labor can be determined by your health care provider with an exam. This will show that the cervix is dilating and getting thinner.   °WHAT TO REMEMBER °· Keep up with your usual exercises and follow other instructions given by your health care provider.   °· Take medicines as directed by your health care provider.   °· Keep your regular prenatal appointments.   °· Eat and drink lightly if you think you are going into labor.   °· If Braxton Hicks contractions are making you uncomfortable:   °· Change  your position from lying down or resting to walking, or from walking to resting.   °· Sit and rest in a tub of warm water.   °· Drink 2-3 glasses of water. Dehydration may cause these contractions.   °· Do slow and deep breathing several times an hour.   °WHEN SHOULD I SEEK IMMEDIATE MEDICAL CARE? °Seek immediate medical care if: °· Your contractions become stronger, more regular, and closer together.   °· You have fluid leaking or gushing from your vagina.   °· You have a fever.   °· You pass blood-tinged mucus.   °· You have vaginal bleeding.   °· You have continuous abdominal pain.   °· You have low back pain that you never had before.   °· You feel your baby's head pushing down and causing pelvic pressure.   °· Your baby is not moving as much as it used to.   °  °This information is not intended to replace advice given to you by your health care provider. Make sure you discuss any questions you have with your health care provider. °  °Document Released: 12/14/2005 Document Revised: 12/19/2013 Document Reviewed: 09/25/2013 °[employer] Interactive Patient Education ©2016 [employer] °Third Trimester of Pregnancy °The third trimester is from week 29 through week 42, months 7 through 9. The third trimester is a time when the fetus is growing rapidly. At the end of the ninth month, the fetus is about 20 inches in length and weighs 6-10 pounds.  °BODY CHANGES °Your body goes through many changes during pregnancy. The changes vary from woman to woman.  °· Your weight will continue to increase. You can expect to gain 25-35 pounds (11-16 kg) by the end of the pregnancy. °· You may begin to get stretch marks on your hips, abdomen, and breasts. °· You may urinate more often because the fetus is moving lower into your pelvis and pressing on your bladder. °· You may develop or continue to have heartburn as a result of your pregnancy. °· You may develop constipation because certain hormones are causing the muscles that push  waste through your intestines to slow down. °· You may develop hemorrhoids or swollen, bulging veins (varicose veins). °· You may have pelvic pain because of the weight gain and pregnancy hormones relaxing your joints between the bones in your pelvis. Backaches may result from overexertion of the muscles supporting your posture. °· You may have changes in your hair. These can include thickening of your hair, rapid growth, and changes in texture. Some women also have hair loss during or after pregnancy, or hair that feels dry or thin. Your hair will most likely return to normal after your baby is born. °· Your breasts will continue to grow and   be tender. A yellow discharge may leak from your breasts called colostrum. °· Your belly button may stick out. °· You may feel short of breath because of your expanding uterus. °· You may notice the fetus "dropping," or moving lower in your abdomen. °· You may have a bloody mucus discharge. This usually occurs a few days to a week before labor begins. °· Your cervix becomes thin and soft (effaced) near your due date. °WHAT TO EXPECT AT YOUR PRENATAL EXAMS  °You will have prenatal exams every 2 weeks until week 36. Then, you will have weekly prenatal exams. During a routine prenatal visit: °9. You will be weighed to make sure you and the fetus are growing normally. °10. Your blood pressure is taken. °11. Your abdomen will be measured to track your baby's growth. °12. The fetal heartbeat will be listened to. °13. Any test results from the previous visit will be discussed. °14. You may have a cervical check near your due date to see if you have effaced. °At around 36 weeks, your caregiver will check your cervix. At the same time, your caregiver will also perform a test on the secretions of the vaginal tissue. This test is to determine if a type of bacteria, Group B streptococcus, is present. Your caregiver will explain this further. °Your caregiver may ask you: °· What your birth  plan is. °· How you are feeling. °· If you are feeling the baby move. °· If you have had any abnormal symptoms, such as leaking fluid, bleeding, severe headaches, or abdominal cramping. °· If you are using any tobacco products, including cigarettes, chewing tobacco, and electronic cigarettes. °· If you have any questions. °Other tests or screenings that may be performed during your third trimester include: °· Blood tests that check for low iron levels (anemia). °· Fetal testing to check the health, activity level, and growth of the fetus. Testing is done if you have certain medical conditions or if there are problems during the pregnancy. °· HIV (human immunodeficiency virus) testing. If you are at high risk, you may be screened for HIV during your third trimester of pregnancy. °FALSE LABOR °You may feel small, irregular contractions that eventually go away. These are called Braxton Hicks contractions, or false labor. Contractions may last for hours, days, or even weeks before true labor sets in. If contractions come at regular intervals, intensify, or become painful, it is best to be seen by your caregiver.  °SIGNS OF LABOR  °· Menstrual-like cramps. °· Contractions that are 5 minutes apart or less. °· Contractions that start on the top of the uterus and spread down to the lower abdomen and back. °· A sense of increased pelvic pressure or back pain. °· A watery or bloody mucus discharge that comes from the vagina. °If you have any of these signs before the 37th week of pregnancy, call your caregiver right away. You need to go to the hospital to get checked immediately. °HOME CARE INSTRUCTIONS  °· Avoid all smoking, herbs, alcohol, and unprescribed drugs. These chemicals affect the formation and growth of the baby. °· Do not use any tobacco products, including cigarettes, chewing tobacco, and electronic cigarettes. If you need help quitting, ask your health care provider. You may receive counseling support and other  resources to help you quit. °· Follow your caregiver's instructions regarding medicine use. There are medicines that are either safe or unsafe to take during pregnancy. °· Exercise only as directed by your caregiver. Experiencing uterine cramps is   a good sign to stop exercising. °· Continue to eat regular, healthy meals. °· Wear a good support bra for breast tenderness. °· Do not use hot tubs, steam rooms, or saunas. °· Wear your seat belt at all times when driving. °· Avoid raw meat, uncooked cheese, cat litter boxes, and soil used by cats. These carry germs that can cause birth defects in the baby. °· Take your prenatal vitamins. °· Take 1500-2000 mg of calcium daily starting at the 20th week of pregnancy until you deliver your baby. °· Try taking a stool softener (if your caregiver approves) if you develop constipation. Eat more high-fiber foods, such as fresh vegetables or fruit and whole grains. Drink plenty of fluids to keep your urine clear or pale yellow. °· Take warm sitz baths to soothe any pain or discomfort caused by hemorrhoids. Use hemorrhoid cream if your caregiver approves. °· If you develop varicose veins, wear support hose. Elevate your feet for 15 minutes, 3-4 times a day. Limit salt in your diet. °· Avoid heavy lifting, wear low heal shoes, and practice good posture. °· Rest a lot with your legs elevated if you have leg cramps or low back pain. °· Visit your dentist if you have not gone during your pregnancy. Use a soft toothbrush to brush your teeth and be gentle when you floss. °· A sexual relationship may be continued unless your caregiver directs you otherwise. °· Do not travel far distances unless it is absolutely necessary and only with the approval of your caregiver. °· Take prenatal classes to understand, practice, and ask questions about the labor and delivery. °· Make a trial run to the hospital. °· Pack your hospital bag. °· Prepare the baby's nursery. °· Continue to go to all your  prenatal visits as directed by your caregiver. °SEEK MEDICAL CARE IF: °· You are unsure if you are in labor or if your water has broken. °· You have dizziness. °· You have mild pelvic cramps, pelvic pressure, or nagging pain in your abdominal area. °· You have persistent nausea, vomiting, or diarrhea. °· You have a bad smelling vaginal discharge. °· You have pain with urination. °SEEK IMMEDIATE MEDICAL CARE IF:  °· You have a fever. °· You are leaking fluid from your vagina. °· You have spotting or bleeding from your vagina. °· You have severe abdominal cramping or pain. °· You have rapid weight loss or gain. °· You have shortness of breath with chest pain. °· You notice sudden or extreme swelling of your face, hands, ankles, feet, or legs. °· You have not felt your baby move in over an hour. °· You have severe headaches that do not go away with medicine. °· You have vision changes. °  °This information is not intended to replace advice given to you by your health care provider. Make sure you discuss any questions you have with your health care provider. °  °Document Released: 12/08/2001 Document Revised: 01/04/2015 Document Reviewed: 02/14/2013 °[employer]vier Interactive Patient Education ©2016 [employer] ° °

## 2016-07-21 ENCOUNTER — Encounter: Payer: Self-pay | Admitting: Obstetrics and Gynecology

## 2016-07-21 ENCOUNTER — Ambulatory Visit (INDEPENDENT_AMBULATORY_CARE_PROVIDER_SITE_OTHER): Payer: Medicaid Other | Admitting: Obstetrics and Gynecology

## 2016-07-21 VITALS — BP 106/62 | HR 70 | Wt 119.0 lb

## 2016-07-21 DIAGNOSIS — O0990 Supervision of high risk pregnancy, unspecified, unspecified trimester: Secondary | ICD-10-CM | POA: Insufficient documentation

## 2016-07-21 DIAGNOSIS — IMO0002 Reserved for concepts with insufficient information to code with codable children: Secondary | ICD-10-CM | POA: Insufficient documentation

## 2016-07-21 DIAGNOSIS — O0993 Supervision of high risk pregnancy, unspecified, third trimester: Secondary | ICD-10-CM

## 2016-07-21 DIAGNOSIS — Z1389 Encounter for screening for other disorder: Secondary | ICD-10-CM | POA: Diagnosis not present

## 2016-07-21 DIAGNOSIS — Z331 Pregnant state, incidental: Secondary | ICD-10-CM

## 2016-07-21 DIAGNOSIS — Z9289 Personal history of other medical treatment: Secondary | ICD-10-CM

## 2016-07-21 DIAGNOSIS — O99323 Drug use complicating pregnancy, third trimester: Secondary | ICD-10-CM | POA: Diagnosis not present

## 2016-07-21 DIAGNOSIS — F1911 Other psychoactive substance abuse, in remission: Secondary | ICD-10-CM | POA: Insufficient documentation

## 2016-07-21 HISTORY — DX: Personal history of other medical treatment: Z92.89
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
## 2016-07-27 ENCOUNTER — Other Ambulatory Visit: Payer: Medicaid Other

## 2016-07-27 DIAGNOSIS — Z3493 Encounter for supervision of normal pregnancy, unspecified, third trimester: Secondary | ICD-10-CM

## 2016-07-28 LAB — CBC
Hematocrit: 30.4 % — ABNORMAL LOW (ref 34.0–46.6)
Hemoglobin: 10.1 g/dL — ABNORMAL LOW (ref 11.1–15.9)
MCH: 31.3 pg (ref 26.6–33.0)
MCHC: 33.2 g/dL (ref 31.5–35.7)
MCV: 94 fL (ref 79–97)
PLATELETS: 245 10*3/uL (ref 150–379)
RBC: 3.23 x10E6/uL — AB (ref 3.77–5.28)
RDW: 13.4 % (ref 12.3–15.4)
WBC: 7.7 10*3/uL (ref 3.4–10.8)

## 2016-07-28 LAB — GLUCOSE TOLERANCE, 2 HOURS W/ 1HR
GLUCOSE, 1 HOUR: 149 mg/dL (ref 65–179)
GLUCOSE, 2 HOUR: 125 mg/dL (ref 65–152)
GLUCOSE, FASTING: 84 mg/dL (ref 65–91)

## 2016-07-28 LAB — RPR: RPR: NONREACTIVE

## 2016-07-28 LAB — HIV ANTIBODY (ROUTINE TESTING W REFLEX): HIV SCREEN 4TH GENERATION: NONREACTIVE

## 2016-07-28 MED FILL — BUPRENORPHINE 8 MG TAB SL: 8 | 28 days supply | Qty: 70 | Fill #0

## 2016-08-04 ENCOUNTER — Encounter (HOSPITAL_COMMUNITY): Payer: Self-pay

## 2016-08-04 ENCOUNTER — Ambulatory Visit (INDEPENDENT_AMBULATORY_CARE_PROVIDER_SITE_OTHER): Payer: Medicaid Other | Admitting: Obstetrics & Gynecology

## 2016-08-04 ENCOUNTER — Ambulatory Visit (HOSPITAL_COMMUNITY)
Admission: RE | Admit: 2016-08-04 | Discharge: 2016-08-04 | Disposition: A | Discharge status: Home / Self Care | Payer: Medicaid Other | Source: Ambulatory Visit | Attending: Obstetrics & Gynecology | Admitting: Obstetrics & Gynecology

## 2016-08-04 VITALS — BP 102/59 | HR 67 | Wt 121.0 lb

## 2016-08-04 DIAGNOSIS — O99323 Drug use complicating pregnancy, third trimester: Secondary | ICD-10-CM | POA: Diagnosis not present

## 2016-08-04 DIAGNOSIS — O99320 Drug use complicating pregnancy, unspecified trimester: Secondary | ICD-10-CM | POA: Diagnosis not present

## 2016-08-04 DIAGNOSIS — Z3A30 30 weeks gestation of pregnancy: Secondary | ICD-10-CM | POA: Insufficient documentation

## 2016-08-04 DIAGNOSIS — B182 Chronic viral hepatitis C: Secondary | ICD-10-CM | POA: Insufficient documentation

## 2016-08-04 DIAGNOSIS — O365931 Maternal care for other known or suspected poor fetal growth, third trimester, fetus 1: Secondary | ICD-10-CM

## 2016-08-04 DIAGNOSIS — F1120 Opioid dependence, uncomplicated: Secondary | ICD-10-CM | POA: Insufficient documentation

## 2016-08-04 DIAGNOSIS — O0993 Supervision of high risk pregnancy, unspecified, third trimester: Secondary | ICD-10-CM | POA: Diagnosis not present

## 2016-08-04 DIAGNOSIS — O98413 Viral hepatitis complicating pregnancy, third trimester: Secondary | ICD-10-CM | POA: Insufficient documentation

## 2016-08-04 DIAGNOSIS — F1911 Other psychoactive substance abuse, in remission: Secondary | ICD-10-CM

## 2016-08-04 DIAGNOSIS — Z302 Encounter for sterilization: Secondary | ICD-10-CM

## 2016-08-04 DIAGNOSIS — O98419 Viral hepatitis complicating pregnancy, unspecified trimester: Secondary | ICD-10-CM | POA: Diagnosis not present

## 2016-08-04 NOTE — Patient Instructions (Signed)
Return to clinic for any scheduled appointments or obstetric concerns, or go to [facility] for evaluation  

## 2016-08-04 NOTE — Progress Notes (Signed)
Subjective:  [name] is a 25 y.o. G2P1001 at 4454w0d being seen today for ongoing prenatal care.  She is currently monitored for the following issues for this high-risk pregnancy and has Supervision of high-risk pregnancy; History of substance abuse; ASCUS with positive high risk HPV; History of RPR test; Pregnancy complicated by subutex maintenance, antepartum (HCC); and Hepatitis C, chronic, maternal, antepartum (HCC) on her problem list.  Patient reports no complaints.  Contractions: Not present. Vag. Bleeding: None.  Movement: Present. Denies leaking of fluid.   The following portions of the patient's history were reviewed and updated as appropriate: allergies, current medications, past family history, past medical history, past social history, past surgical history and problem list. Problem list updated.  Objective:   Vitals:   08/04/16 0859  BP: (!) 102/59  Pulse: 67  Weight: 121 lb (54.9 kg)    Fetal Status: Fetal Heart Rate (bpm): 121 Fundal Height: 27 cm Movement: Present     General:  Alert, oriented and cooperative. Patient is in no acute distress.  Skin: Skin is warm and dry. No rash noted.   Cardiovascular: Normal heart rate noted  Respiratory: Normal respiratory effort, no problems with respiration noted  Abdomen: Soft, gravid, appropriate for gestational age. Pain/Pressure: Absent     Pelvic:  Cervical exam deferred        Extremities: Normal range of motion.  Edema: None  Mental Status: Normal mood and affect. Normal behavior. Normal judgment and thought content.   Urinalysis: Urine Protein: Negative Urine Glucose: Negative  Assessment and Plan:  Pregnancy: G2P1001 at 1254w0d  1. Pregnancy complicated by subutex maintenance, antepartum (HCC) Offered NICU tour, she is still deciding for now - US MFM OB FOLLOW UP; Future  2. Hepatitis C, chronic, maternal, antepartum (HCC) Reports positive test earlier in pregnancy, no follow up.  - HCV RNA quant  3. Request  for sterilization BTS papers signed today  4. Small for dates affecting management of mother, third trimester, fetus 1 - US MFM OB FOLLOW UP; Future  5. Supervision of high-risk pregnancy, third trimester Preterm labor symptoms and general obstetric precautions including but not limited to vaginal bleeding, contractions, leaking of fluid and fetal movement were reviewed in detail with the patient. Please refer to After Visit Summary for other counseling recommendations.  Return in about 2 weeks (around 08/18/2016).   Tereso NewcomerUgonna A Anyanwu, MD

## 2016-08-06 LAB — HCV RNA QUANT
HCV log10: 6.573 log10 IU/mL
Hepatitis C Quantitation: 3740000 IU/mL

## 2016-08-18 ENCOUNTER — Ambulatory Visit (INDEPENDENT_AMBULATORY_CARE_PROVIDER_SITE_OTHER): Payer: Medicaid Other | Admitting: Obstetrics & Gynecology

## 2016-08-18 VITALS — BP 108/63 | HR 71 | Temp 98.1°F | Wt 116.1 lb

## 2016-08-18 DIAGNOSIS — Z3493 Encounter for supervision of normal pregnancy, unspecified, third trimester: Secondary | ICD-10-CM | POA: Diagnosis not present

## 2016-08-18 DIAGNOSIS — Z1389 Encounter for screening for other disorder: Secondary | ICD-10-CM

## 2016-08-18 DIAGNOSIS — F17210 Nicotine dependence, cigarettes, uncomplicated: Secondary | ICD-10-CM | POA: Insufficient documentation

## 2016-08-18 DIAGNOSIS — Z331 Pregnant state, incidental: Secondary | ICD-10-CM | POA: Diagnosis not present

## 2016-08-18 DIAGNOSIS — Z720 Tobacco use: Secondary | ICD-10-CM

## 2016-08-18 DIAGNOSIS — O9989 Other specified diseases and conditions complicating pregnancy, childbirth and the puerperium: Secondary | ICD-10-CM | POA: Diagnosis not present

## 2016-08-18 DIAGNOSIS — J45909 Unspecified asthma, uncomplicated: Secondary | ICD-10-CM

## 2016-08-18 DIAGNOSIS — O99519 Diseases of the respiratory system complicating pregnancy, unspecified trimester: Secondary | ICD-10-CM

## 2016-08-18 LAB — POCT URINALYSIS DIPSTICK
BILIRUBIN UA: NEGATIVE
Blood, UA: NEGATIVE
Glucose, UA: NEGATIVE
KETONES UA: NEGATIVE
Nitrite, UA: NEGATIVE
PH UA: 6
Protein, UA: NEGATIVE
SPEC GRAV UA: 1.02
Urobilinogen, UA: 0.2

## 2016-08-18 MED ORDER — FLUTICASONE-SALMETEROL 100-50 MCG/DOSE IN AEPB: 1.0000 | INHALATION_SPRAY | Freq: Two times a day (BID) | RESPIRATORY_TRACT | 0 refills | Status: DC

## 2016-08-18 NOTE — Progress Notes (Signed)
Subjective:cough and wheezing  [name] is a 25 y.o. G2P1001 at 4866w0d being seen today for ongoing prenatal care.  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  Patient reports cough for almost 2 weeks following cold symptoms, no fever.  Contractions: Not present. Vag. Bleeding: None.  Movement: Present. Denies leaking of fluid.   The following portions of the patient's history were reviewed and updated as appropriate: allergies, current medications, past family history, past medical history, past social history, past surgical history and problem list. Problem list updated.  Objective:   Vitals:   08/18/16 1459  BP: 108/63  Pulse: 71  Temp: 98.1 F (36.7 C)  Weight: 116 lb 1.6 oz (52.7 kg)    Fetal Status: Fetal Heart Rate (bpm): 145 Fundal Height: 32 cm Movement: Present     General:  Alert, oriented and cooperative. Patient is in no acute distress.  Skin: Skin is warm and dry. No rash noted.   Cardiovascular: Normal heart rate noted  Respiratory: Normal respiratory effort, no problems with respiration noted  Abdomen: Soft, gravid, appropriate for gestational age. Pain/Pressure: Present     Pelvic:  Cervical exam deferred        Extremities: Normal range of motion.  Edema: None  Mental Status: Normal mood and affect. Normal behavior. Normal judgment and thought content.   Urinalysis:      Assessment and Plan:  Pregnancy: G2P1001 at 4566w0d  1. Prenatal care, third trimester  - POCT Urinalysis Dipstick  2. Cigarette smoker Urged to quit  3. Asthma affecting pregnancy, antepartum Wheezing bilaterally today  - Fluticasone-Salmeterol (ADVAIR DISKUS) 100-50  MCG/DOSE AEPB; Inhale 1 puff into the lungs 2 (two) times daily.  Dispense: 60 each; Refill: 0  Preterm labor symptoms and general obstetric precautions including but not limited to vaginal bleeding, contractions, leaking of fluid and fetal movement were reviewed in detail with the patient. Please refer to After Visit Summary for other counseling recommendations.  Return in about 2 weeks (around 09/01/2016).   Adam PhenixJames G Savir Blanke, MD

## 2016-08-18 NOTE — Progress Notes (Signed)
Pt c/o pelvic pain, would like try maternity belt.

## 2016-08-21 ENCOUNTER — Telehealth: Payer: Self-pay | Admitting: *Deleted

## 2016-08-21 MED FILL — BUPRENORPHINE 8 MG TAB SL: 8 | 15 days supply | Qty: 38 | Fill #0

## 2016-08-21 NOTE — Telephone Encounter (Signed)
Need for clarification. Fax from pharmacy, Advair is not covered under patient plan. I do not see any failed medications on patients med list.  Would you like to prescribe another medication?

## 2016-08-22 ENCOUNTER — Other Ambulatory Visit: Payer: Self-pay | Admitting: Obstetrics & Gynecology

## 2016-08-22 MED ORDER — BECLOMETHASONE DIPROPIONATE 40 MCG/ACT IN AERS: 1.0000 | INHALATION_SPRAY | Freq: Two times a day (BID) | RESPIRATORY_TRACT | Status: DC

## 2016-09-01 ENCOUNTER — Ambulatory Visit (INDEPENDENT_AMBULATORY_CARE_PROVIDER_SITE_OTHER): Payer: Medicaid Other | Admitting: Obstetrics and Gynecology

## 2016-09-01 VITALS — BP 101/67 | HR 74 | Temp 98.4°F | Wt 114.2 lb

## 2016-09-01 DIAGNOSIS — O99320 Drug use complicating pregnancy, unspecified trimester: Secondary | ICD-10-CM

## 2016-09-01 DIAGNOSIS — B182 Chronic viral hepatitis C: Secondary | ICD-10-CM

## 2016-09-01 DIAGNOSIS — O98419 Viral hepatitis complicating pregnancy, unspecified trimester: Secondary | ICD-10-CM

## 2016-09-01 DIAGNOSIS — Z3493 Encounter for supervision of normal pregnancy, unspecified, third trimester: Secondary | ICD-10-CM

## 2016-09-01 DIAGNOSIS — O0993 Supervision of high risk pregnancy, unspecified, third trimester: Secondary | ICD-10-CM

## 2016-09-01 DIAGNOSIS — F17210 Nicotine dependence, cigarettes, uncomplicated: Secondary | ICD-10-CM

## 2016-09-01 DIAGNOSIS — F1120 Opioid dependence, uncomplicated: Secondary | ICD-10-CM

## 2016-09-01 LAB — POCT URINALYSIS DIPSTICK
BILIRUBIN UA: NEGATIVE
Blood, UA: NEGATIVE
GLUCOSE UA: NEGATIVE
Ketones, UA: NEGATIVE
LEUKOCYTES UA: NEGATIVE
NITRITE UA: NEGATIVE
PH UA: 5
PROTEIN UA: NEGATIVE
Spec Grav, UA: 1.025
Urobilinogen, UA: NEGATIVE

## 2016-09-01 NOTE — Progress Notes (Signed)
Patient reports irregular contractions, 1 yesterday and 1 today, reports not painful. Patient could not fill inhaler because they were not approved by insurance, pt reports she is doing better and does not need them at this time.

## 2016-09-01 NOTE — Progress Notes (Signed)
Subjective:  Holly RoanCarey M [name] is a 25 y.o. G2P1001 at 3780w0d being seen today for ongoing prenatal care.  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  Patient reports no complaints.  Contractions: Irregular. Vag. Bleeding: None.  Movement: Present. Denies leaking of fluid.   The following portions of the patient's history were reviewed and updated as appropriate: allergies, current medications, past family history, past medical history, past social history, past surgical history and problem list. Problem list updated.  Objective:   Vitals:   09/01/16 1504  BP: 101/67  Pulse: 74  Temp: 98.4 F (36.9 C)  Weight: 114 lb 3.2 oz (51.8 kg)    Fetal Status:     Movement: Present     General:  Alert, oriented and cooperative. Patient is in no acute distress.  Skin: Skin is warm and dry. No rash noted.   Cardiovascular: Normal heart rate noted  Respiratory: Normal respiratory effort, no problems with respiration noted  Abdomen: Soft, gravid, appropriate for gestational age. Pain/Pressure: Absent     Pelvic:  Cervical exam deferred        Extremities: Normal range of motion.  Edema: None  Mental Status: Normal mood and affect. Normal behavior. Normal judgment and thought content.   Urinalysis:      Assessment and Plan:  Pregnancy: G2P1001 at 8780w0d  1. Prenatal care, third trimester  - POCT urinalysis dipstick  2. Hepatitis C, chronic, maternal, antepartum (HCC)  3. Supervision of high-risk pregnancy, third trimester  4. Pregnancy complicated by subutex maintenance, antepartum (HCC)  5. Cigarette smoker Reports that she has quit.  Wheezing from last visit has  resolved. No H/O asthma per pt.   Preterm labor symptoms and general obstetric precautions including but not limited to vaginal bleeding, contractions, leaking of fluid and fetal movement were reviewed in detail with the patient. Please refer to After Visit Summary for other counseling recommendations.  Return in about 2 weeks (around 09/15/2016) for OB visit.   Hermina StaggersMichael L Jocsan Mcginley, MD

## 2016-09-04 MED FILL — BUPRENORPHINE 8 MG TAB SL: 8 | 14 days supply | Qty: 42 | Fill #0

## 2016-09-10 ENCOUNTER — Encounter: Payer: Self-pay | Admitting: *Deleted

## 2016-09-10 LAB — PROCEDURE REPORT - SCANNED: PAP SMEAR: ABNORMAL — AB

## 2016-09-15 ENCOUNTER — Encounter: Payer: Self-pay | Admitting: *Deleted

## 2016-09-15 ENCOUNTER — Encounter: Payer: Self-pay | Admitting: Obstetrics

## 2016-09-15 ENCOUNTER — Ambulatory Visit (INDEPENDENT_AMBULATORY_CARE_PROVIDER_SITE_OTHER): Payer: Medicaid Other | Admitting: Obstetrics

## 2016-09-15 VITALS — BP 103/72 | HR 74 | Wt 116.0 lb

## 2016-09-15 DIAGNOSIS — Z3493 Encounter for supervision of normal pregnancy, unspecified, third trimester: Secondary | ICD-10-CM

## 2016-09-15 DIAGNOSIS — O0990 Supervision of high risk pregnancy, unspecified, unspecified trimester: Secondary | ICD-10-CM

## 2016-09-15 NOTE — Progress Notes (Signed)
Subjective:    [name] is a 25 y.o. female being seen today for her obstetrical visit. She is at 3959w0d gestation. Patient reports no complaints. Fetal movement: normal.  Problem List Items Addressed This Visit    Supervision of high-risk pregnancy    Other Visit Diagnoses   None.    Patient Active Problem List   Diagnosis Date Noted  . Cigarette smoker 08/18/2016  . Pregnancy complicated by subutex maintenance, antepartum (HCC) 08/04/2016  . Hepatitis C, chronic, maternal, antepartum (HCC) 08/04/2016  . Supervision of high-risk pregnancy 07/21/2016  . History of substance abuse 07/21/2016  . ASCUS with positive high risk HPV 07/21/2016  . 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 GBS sent TDAP offered  Rhogam given for RH negative Pediatrician: discussed. Infant feeding: plans to breastfeed. Maternity leave: discussed. Cigarette smoking: smokes ? PPD. Orders Placed This Encounter  Procedures  . OB RESULTS CONSOLE GC/Chlamydia    This external order was created through the Results Console.  Marland Kitchen. OB RESULTS CONSOLE RPR    This external order was created through the Results Console.  Marland Kitchen. OB RESULTS CONSOLE HIV antibody    This external order was created through the Results Console.  Marland Kitchen. OB RESULTS CONSOLE Rubella Antibody    This external order was created through the Results Console.  Marland Kitchen. OB RESULTS CONSOLE Hemoglobin and hematocrit, blood    This external order was created through the Results Console.  Marland Kitchen. OB RESULTS CONSOLE Hepatitis B surface antigen    This external order was created through the Results Console.  Marland Kitchen. OB RESULTS CONSOLE ABO/Rh    This external order was created through the Results  Console.  Marland Kitchen. OB RESULTS CONSOLE Antibody Screen    This external order was created through the Results Console.   No orders of the defined types were placed in this encounter.  Follow up in 1 Week.   Patient ID: [name], female   DOB: 05-16-1991, 25 y.o.   MRN: [id number]

## 2016-09-16 ENCOUNTER — Telehealth: Payer: Self-pay | Admitting: *Deleted

## 2016-09-16 ENCOUNTER — Other Ambulatory Visit: Payer: Self-pay | Admitting: Obstetrics

## 2016-09-16 DIAGNOSIS — B182 Chronic viral hepatitis C: Secondary | ICD-10-CM

## 2016-09-16 LAB — COMPREHENSIVE METABOLIC PANEL
ALT: 87 IU/L — AB (ref 0–32)
AST: 59 IU/L — AB (ref 0–40)
Albumin/Globulin Ratio: 1.5 (ref 1.2–2.2)
Albumin: 3.5 g/dL (ref 3.5–5.5)
Alkaline Phosphatase: 143 IU/L — ABNORMAL HIGH (ref 39–117)
BILIRUBIN TOTAL: 0.3 mg/dL (ref 0.0–1.2)
BUN/Creatinine Ratio: 7 — ABNORMAL LOW (ref 9–23)
BUN: 4 mg/dL — AB (ref 6–20)
CALCIUM: 8.5 mg/dL — AB (ref 8.7–10.2)
CHLORIDE: 99 mmol/L (ref 96–106)
CO2: 23 mmol/L (ref 18–29)
Creatinine, Ser: 0.6 mg/dL (ref 0.57–1.00)
GFR calc non Af Amer: 126 mL/min/{1.73_m2} (ref >59)
GFR, EST AFRICAN AMERICAN: 145 mL/min/{1.73_m2} (ref >59)
GLUCOSE: 73 mg/dL (ref 65–99)
Globulin, Total: 2.3 g/dL (ref 1.5–4.5)
Potassium: 4.1 mmol/L (ref 3.5–5.2)
Sodium: 135 mmol/L (ref 134–144)
TOTAL PROTEIN: 5.8 g/dL — AB (ref 6.0–8.5)

## 2016-09-16 NOTE — Telephone Encounter (Signed)
Left phone message to call office for lab results.

## 2016-09-17 ENCOUNTER — Encounter: Payer: Self-pay | Admitting: *Deleted

## 2016-09-17 ENCOUNTER — Telehealth: Payer: Self-pay | Admitting: *Deleted

## 2016-09-17 LAB — STREP GP B NAA: Strep Gp B NAA: NEGATIVE

## 2016-09-17 LAB — OB RESULTS CONSOLE GBS: STREP GROUP B AG: NEGATIVE

## 2016-09-17 NOTE — Telephone Encounter (Signed)
Unable to reach by phone letter sent

## 2016-09-18 MED FILL — BUPRENORPHINE 8 MG TABLET S: 8 | 14 days supply | Qty: 42 | Fill #0

## 2016-09-23 ENCOUNTER — Ambulatory Visit (INDEPENDENT_AMBULATORY_CARE_PROVIDER_SITE_OTHER): Payer: Medicaid Other | Admitting: Obstetrics and Gynecology

## 2016-09-23 VITALS — BP 110/70 | HR 81 | Temp 98.4°F | Wt 118.6 lb

## 2016-09-23 DIAGNOSIS — B182 Chronic viral hepatitis C: Secondary | ICD-10-CM

## 2016-09-23 DIAGNOSIS — F1120 Opioid dependence, uncomplicated: Secondary | ICD-10-CM

## 2016-09-23 DIAGNOSIS — Z87898 Personal history of other specified conditions: Secondary | ICD-10-CM | POA: Diagnosis not present

## 2016-09-23 DIAGNOSIS — F1911 Other psychoactive substance abuse, in remission: Secondary | ICD-10-CM

## 2016-09-23 DIAGNOSIS — O0993 Supervision of high risk pregnancy, unspecified, third trimester: Secondary | ICD-10-CM | POA: Diagnosis not present

## 2016-09-23 DIAGNOSIS — O98419 Viral hepatitis complicating pregnancy, unspecified trimester: Secondary | ICD-10-CM

## 2016-09-23 DIAGNOSIS — O99320 Drug use complicating pregnancy, unspecified trimester: Secondary | ICD-10-CM | POA: Diagnosis not present

## 2016-09-23 NOTE — Progress Notes (Signed)
Patient is in office and states that she is feeling well. 

## 2016-09-23 NOTE — Progress Notes (Signed)
   PRENATAL VISIT NOTE  Subjective:  [name] is a 25 y.o. G2P1001 at 6586w1d being seen today for ongoing prenatal care.  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  Patient reports no complaints.  Contractions: Not present. Vag. Bleeding: None.  Movement: Present. Denies leaking of fluid.   The following portions of the patient's history were reviewed and updated as appropriate: allergies, current medications, past family history, past medical history, past social history, past surgical history and problem list. Problem list updated.  Objective:   Vitals:   09/23/16 1639  BP: 110/70  Pulse: 81  Temp: 98.4 F (36.9 C)  Weight: 118 lb 9.6 oz (53.8 kg)    Fetal Status: Fetal Heart Rate (bpm): 126 Fundal Height: 36 cm Movement: Present     General:  Alert, oriented and cooperative. Patient is in no acute distress.  Skin: Skin is warm and dry. No rash noted.   Cardiovascular: Normal heart rate noted  Respiratory: Normal respiratory effort, no problems with respiration noted  Abdomen: Soft, gravid, appropriate for gestational age. Pain/Pressure: Present     Pelvic:  Cervical exam deferred        Extremities: Normal range of motion.  Edema: None  Mental Status: Normal mood and affect. Normal behavior. Normal judgment and thought content.   Urinalysis:      Assessment and Plan:  Pregnancy: G2P1001 at 6686w1d  1. Supervision of high-risk pregnancy, third trimester Patient is doing well without complaints She declined flu vaccine She is scheduled for NICU tour next week  2. Pregnancy complicated by subutex maintenance, antepartum (HCC) Continue current  dosage Discussed associated risks of NAS   3. History of substance abuse   4. Hepatitis C, chronic, maternal, antepartum (HCC)   Term labor symptoms and general obstetric precautions including but not limited to vaginal bleeding, contractions, leaking of fluid and fetal movement were reviewed in detail with the patient. Please refer to After Visit Summary for other counseling recommendations.  Return in about 1 week (around 09/30/2016).  Catalina AntiguaPeggy Cree Napoli, MD

## 2016-09-29 ENCOUNTER — Encounter (HOSPITAL_COMMUNITY): Payer: Self-pay

## 2016-09-29 ENCOUNTER — Ambulatory Visit (HOSPITAL_COMMUNITY)
Admission: RE | Admit: 2016-09-29 | Discharge: 2016-09-29 | Disposition: A | Discharge status: Home / Self Care | Payer: Medicaid Other | Source: Ambulatory Visit | Attending: Obstetrics | Admitting: Obstetrics

## 2016-09-29 VITALS — BP 102/61 | HR 60 | Wt 121.5 lb

## 2016-09-29 DIAGNOSIS — B182 Chronic viral hepatitis C: Secondary | ICD-10-CM | POA: Diagnosis not present

## 2016-09-29 DIAGNOSIS — O98413 Viral hepatitis complicating pregnancy, third trimester: Secondary | ICD-10-CM | POA: Insufficient documentation

## 2016-09-29 DIAGNOSIS — O98419 Viral hepatitis complicating pregnancy, unspecified trimester: Secondary | ICD-10-CM

## 2016-09-29 DIAGNOSIS — F1110 Opioid abuse, uncomplicated: Secondary | ICD-10-CM | POA: Insufficient documentation

## 2016-09-29 DIAGNOSIS — O99323 Drug use complicating pregnancy, third trimester: Secondary | ICD-10-CM | POA: Insufficient documentation

## 2016-09-29 DIAGNOSIS — Z3A38 38 weeks gestation of pregnancy: Secondary | ICD-10-CM | POA: Insufficient documentation

## 2016-09-29 DIAGNOSIS — O99320 Drug use complicating pregnancy, unspecified trimester: Secondary | ICD-10-CM

## 2016-09-29 DIAGNOSIS — F1120 Opioid dependence, uncomplicated: Secondary | ICD-10-CM

## 2016-09-29 NOTE — Progress Notes (Signed)
[name] M Olander is a 25 y.o. female patient, P1001 at 3138 weeks. She has a historu of opiate abuse, and is currently on 20mg  Subutex per day for maintenance. The patient denies ever using parenteral opiates, but does admit to a long history (10 years) of multiple homemade tattoos. She has a recent viral load 8730f c. 3.7 million. Her most recent labs show slight elevations of her AST (59) and ALT (87). The next most recent  AST and ALT were from 2009 and were normal  1. [redacted] weeks gestation of pregnancy   2. Pregnancy complicated by subutex maintenance, antepartum (HCC)   3. Chronic hepatitis C complicating pregnancy, antepartum (HCC)     History reviewed. No pertinent past medical history.  Current Outpatient Prescriptions  Medication Sig Dispense Refill  . buprenorphine (SUBUTEX) 8 MG SUBL SL tablet Place 6-8 mg under the tongue 2 (two) times daily.    . Buprenorphine HCl (SUBUTEX SL) Place 4 mg under the tongue daily.    . Docosahexaenoic Acid (DHA PO) Take 1 tablet by mouth daily.    . ferrous sulfate 325 (65 FE) MG tablet Take 325 mg by mouth daily.  6  . Prenatal Vit-Fe Fumarate-FA (PRENATAL MULTIVITAMIN) TABS tablet Take 1 tablet by mouth daily at 12 noon.     No current facility-administered medications for this encounter.    Allergies  Allergen Reactions  . Dextromethorphan Swelling   Active Problems:   * No active hospital problems. *  Blood pressure 102/61, pulse 60, weight 121 lb 8 oz (55.1 kg), last menstrual period 01/07/2016.  ROS: limited ROS was performed with no pertinent findings  Physical Exam: see vital signs above. She is a well nourished well developed white female in no apparent distress.  Impression: IUP at 38 weeks; Hepatitis C, history of opiate abuse, on Subutex maintenance   Recommendations: Current recommendations do not advocate cesarean delivery to avoid vertical transmission of HCV. I would recommend avoidance of fetal scalp electrodes, artifical ROM if  possible (to avoid risk of prolonged ROM). I let the patient know that the overriding priority should be treatment with cure-producing antiviral medications as soon as possible. I also discussed the high probability of neonatal adaptation syndrome in her newborn. Maternal HCV is NOT a contraindication to breastfeeding, but I did not discuss that with her on today's visit.  I spent >50% of the face-to-face time of this 30 minute visit in counseling the patient  Allegra LaiMark Glidwell Newman 09/29/2016

## 2016-10-01 ENCOUNTER — Encounter: Payer: Medicaid Other | Admitting: Advanced Practice Midwife

## 2016-10-02 MED FILL — BUPRENORPHINE 8 MG TAB SL: 8 | 28 days supply | Qty: 84 | Fill #0

## 2016-10-08 ENCOUNTER — Ambulatory Visit (INDEPENDENT_AMBULATORY_CARE_PROVIDER_SITE_OTHER): Payer: Medicaid Other | Admitting: Obstetrics and Gynecology

## 2016-10-08 ENCOUNTER — Encounter: Payer: Self-pay | Admitting: Obstetrics and Gynecology

## 2016-10-08 VITALS — BP 98/58 | HR 69 | Wt 122.0 lb

## 2016-10-08 DIAGNOSIS — O0993 Supervision of high risk pregnancy, unspecified, third trimester: Secondary | ICD-10-CM

## 2016-10-08 NOTE — Progress Notes (Signed)
Prenatal Visit Note Date: 10/08/2016 Clinic: [facility]  Subjective:  [name] is a 25 y.o. G2P1001 at 1854w2d being seen today for ongoing prenatal care.  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  Patient reports no complaints.   Contractions: Irritability. Vag. Bleeding: None.  Movement: Present. Denies leaking of fluid.   The following portions of the patient's history were reviewed and updated as appropriate: allergies, current medications, past family history, past medical history, past social history, past surgical history and problem list. Problem list updated.  Objective:   Vitals:   10/08/16 1556  BP: (!) 98/58  Pulse: 69  Weight: 122 lb (55.3 kg)    Fetal Status: Fetal Heart Rate (bpm): 129 Fundal Height: 38 cm Movement: Present  Presentation: Vertex  General:  Alert, oriented and cooperative. Patient is in no acute distress.  Skin: Skin is warm and dry. No rash noted.   Cardiovascular: Normal heart rate noted  Respiratory: Normal respiratory effort, no problems with respiration noted  Abdomen: Soft, gravid, appropriate for gestational age. Pain/Pressure: Present     Pelvic:  Cervical exam deferred        Extremities: Normal range of motion.  Edema: None  Mental Status: Normal mood and affect. Normal behavior. Normal judgment and thought content.   Urinalysis:      Assessment and Plan:  Pregnancy: G2P1001 at 454w2d  *Pregnancy: routine care. D/w pt that will set up post term IOL for 41wks at nv. BTL papers signed.  *Chronic hep C: seen by GI. No interventions during pregnancy needed *On subuxone: s/p NICU visit. D/w pt already re: NAAS *ASCUS/HPV+  pap smear: colpo not done in early pregnancy. Needs colpo PP *h/o +RPR: with NOB @ 1:1 with negative TPA. Negative titer at 28wks. F/u admit lab  Term labor symptoms and general obstetric precautions including but not limited to vaginal bleeding, contractions, leaking of fluid and fetal movement were reviewed in detail with the patient. Please refer to After Visit Summary for other counseling recommendations.  Return in about 1 week (around 10/15/2016).   [place] Bingharlie Marti Acebo, MD

## 2016-10-12 ENCOUNTER — Ambulatory Visit (INDEPENDENT_AMBULATORY_CARE_PROVIDER_SITE_OTHER): Payer: Medicaid Other | Admitting: Obstetrics and Gynecology

## 2016-10-12 VITALS — BP 107/66 | HR 107 | Temp 98.4°F | Wt 125.1 lb

## 2016-10-12 DIAGNOSIS — B182 Chronic viral hepatitis C: Secondary | ICD-10-CM | POA: Diagnosis not present

## 2016-10-12 DIAGNOSIS — F1911 Other psychoactive substance abuse, in remission: Secondary | ICD-10-CM

## 2016-10-12 DIAGNOSIS — Z87898 Personal history of other specified conditions: Secondary | ICD-10-CM

## 2016-10-12 DIAGNOSIS — Z79891 Long term (current) use of opiate analgesic: Secondary | ICD-10-CM

## 2016-10-12 DIAGNOSIS — O98419 Viral hepatitis complicating pregnancy, unspecified trimester: Secondary | ICD-10-CM | POA: Diagnosis not present

## 2016-10-12 DIAGNOSIS — F1120 Opioid dependence, uncomplicated: Secondary | ICD-10-CM

## 2016-10-12 DIAGNOSIS — O0993 Supervision of high risk pregnancy, unspecified, third trimester: Secondary | ICD-10-CM

## 2016-10-12 DIAGNOSIS — O99323 Drug use complicating pregnancy, third trimester: Secondary | ICD-10-CM | POA: Diagnosis not present

## 2016-10-12 DIAGNOSIS — O99320 Drug use complicating pregnancy, unspecified trimester: Secondary | ICD-10-CM

## 2016-10-12 NOTE — Progress Notes (Signed)
   PRENATAL VISIT NOTE  Subjective:  [name] is a 25 y.o. G2P1001 at 394w6d being seen today for ongoing prenatal care.  She is currently monitored for the following issues for this high-risk pregnancy and has Supervision of high-risk pregnancy; History of substance abuse; ASCUS with positive high risk HPV; Pregnancy complicated by subutex maintenance, antepartum (HCC); Hepatitis C, chronic, maternal, antepartum (HCC); and Cigarette smoker on her problem list.  Patient reports no complaints.  Contractions: Not present. Vag. Bleeding: None.  Movement: Present. Denies leaking of fluid.   The following portions of the patient's history were reviewed and updated as appropriate: allergies, current medications, past family history, past medical history, past social history, past surgical history and problem list. Problem list updated.  Objective:   Vitals:   10/12/16 1349  BP: 107/66  Pulse: (!) 107  Temp: 98.4 F (36.9 C)  Weight: 125 lb 1.6 oz (56.7 kg)    Fetal Status: Fetal Heart Rate (bpm): 120 Fundal Height: 38 cm Movement: Present     General:  Alert, oriented and cooperative. Patient is in no acute distress.  Skin: Skin is warm and dry. No rash noted.   Cardiovascular: Normal heart rate noted  Respiratory: Normal respiratory effort, no problems with respiration noted  Abdomen: Soft, gravid, appropriate for gestational age. Pain/Pressure: Absent     Pelvic:  Cervical exam deferred        Extremities: Normal range of motion.  Edema: Trace  Mental Status: Normal mood and affect. Normal behavior. Normal judgment and thought content.   Assessment and Plan:  Pregnancy: G2P1001 at 264w6d  1. Supervision of high risk pregnancy in third trimester Patient is doing well without complaints Will schedule postdate fetal testing for this week IOL scheduled for 10/24 at 41 weeks Informed patient of induction process  2. Pregnancy complicated by subutex maintenance, antepartum  [facility](HCC) Patient currently on 2.5 mg daily Patient had NICU tour  3. History of substance abuse   4. Hepatitis C, chronic, maternal, antepartum (HCC)   Term labor symptoms and general obstetric precautions including but not limited to vaginal bleeding, contractions, leaking of fluid and fetal movement were reviewed in detail with the patient. Please refer to After Visit Summary for other counseling recommendations.  Return in about 3 days (around 10/15/2016) for NST with AFI.  Catalina AntiguaPeggy Shemika Robbs, MD

## 2016-10-15 ENCOUNTER — Encounter: Payer: Self-pay | Admitting: *Deleted

## 2016-10-16 ENCOUNTER — Inpatient Hospital Stay (HOSPITAL_COMMUNITY)
Admission: AD | Admit: 2016-10-16 | Discharge: 2016-10-18 | DRG: 774 | Disposition: A | Discharge status: Home / Self Care | Payer: Medicaid Other | Source: Ambulatory Visit | Attending: Obstetrics & Gynecology | Admitting: Obstetrics & Gynecology

## 2016-10-16 ENCOUNTER — Inpatient Hospital Stay (HOSPITAL_COMMUNITY): Payer: Medicaid Other | Admitting: Anesthesiology

## 2016-10-16 ENCOUNTER — Encounter (HOSPITAL_COMMUNITY): Payer: Self-pay | Admitting: *Deleted

## 2016-10-16 DIAGNOSIS — Z8249 Family history of ischemic heart disease and other diseases of the circulatory system: Secondary | ICD-10-CM | POA: Diagnosis not present

## 2016-10-16 DIAGNOSIS — Z833 Family history of diabetes mellitus: Secondary | ICD-10-CM | POA: Diagnosis not present

## 2016-10-16 DIAGNOSIS — O134 Gestational [pregnancy-induced] hypertension without significant proteinuria, complicating childbirth: Secondary | ICD-10-CM | POA: Diagnosis present

## 2016-10-16 DIAGNOSIS — O9842 Viral hepatitis complicating childbirth: Secondary | ICD-10-CM | POA: Diagnosis present

## 2016-10-16 DIAGNOSIS — B182 Chronic viral hepatitis C: Secondary | ICD-10-CM | POA: Diagnosis present

## 2016-10-16 DIAGNOSIS — F17210 Nicotine dependence, cigarettes, uncomplicated: Secondary | ICD-10-CM | POA: Diagnosis present

## 2016-10-16 DIAGNOSIS — O99334 Smoking (tobacco) complicating childbirth: Secondary | ICD-10-CM | POA: Diagnosis present

## 2016-10-16 DIAGNOSIS — O4202 Full-term premature rupture of membranes, onset of labor within 24 hours of rupture: Secondary | ICD-10-CM | POA: Diagnosis present

## 2016-10-16 DIAGNOSIS — Z3A40 40 weeks gestation of pregnancy: Secondary | ICD-10-CM

## 2016-10-16 DIAGNOSIS — Z3483 Encounter for supervision of other normal pregnancy, third trimester: Secondary | ICD-10-CM | POA: Diagnosis present

## 2016-10-16 LAB — CBC
HCT: 34.9 % — ABNORMAL LOW (ref 36.0–46.0)
Hemoglobin: 12 g/dL (ref 12.0–15.0)
MCH: 31.3 pg (ref 26.0–34.0)
MCHC: 34.4 g/dL (ref 30.0–36.0)
MCV: 91.1 fL (ref 78.0–100.0)
PLATELETS: 212 10*3/uL (ref 150–400)
RBC: 3.83 MIL/uL — AB (ref 3.87–5.11)
RDW: 14 % (ref 11.5–15.5)
WBC: 10.4 10*3/uL (ref 4.0–10.5)

## 2016-10-16 LAB — TYPE AND SCREEN
ABO/RH(D): A POS
Antibody Screen: NEGATIVE

## 2016-10-16 LAB — RPR: RPR: NONREACTIVE

## 2016-10-16 MED ORDER — PHENYLEPHRINE 40 MCG/ML (10ML) SYRINGE FOR IV PUSH (FOR BLOOD PRESSURE SUPPORT)
80.0000 ug | PREFILLED_SYRINGE | INTRAVENOUS | Status: DC | PRN
  Administered 2016-10-16 (×2): 80 ug via INTRAVENOUS
  Filled 2016-10-16: qty 5

## 2016-10-16 MED ORDER — OXYCODONE HCL 5 MG PO TABS: 10.0000 mg | ORAL_TABLET | ORAL | Status: DC | PRN

## 2016-10-16 MED ORDER — EPHEDRINE 5 MG/ML INJ
10.0000 mg | INTRAVENOUS | Status: DC | PRN
  Filled 2016-10-16: qty 4

## 2016-10-16 MED ORDER — OXYTOCIN BOLUS FROM INFUSION
500.0000 mL | Freq: Once | INTRAVENOUS | Status: AC
  Administered 2016-10-16: 500 mL via INTRAVENOUS

## 2016-10-16 MED ORDER — FENTANYL 2.5 MCG/ML BUPIVACAINE 1/10 % EPIDURAL INFUSION (WH - ANES)
INTRAMUSCULAR | Status: AC
  Filled 2016-10-16: qty 125

## 2016-10-16 MED ORDER — ONDANSETRON HCL 4 MG/2ML IJ SOLN: 4.0000 mg | Freq: Four times a day (QID) | INTRAMUSCULAR | Status: DC | PRN

## 2016-10-16 MED ORDER — LACTATED RINGERS IV SOLN
INTRAVENOUS | Status: DC
  Administered 2016-10-16: 06:00:00 via INTRAVENOUS

## 2016-10-16 MED ORDER — LACTATED RINGERS IV SOLN: 500.0000 mL | Freq: Once | INTRAVENOUS | Status: DC

## 2016-10-16 MED ORDER — ACETAMINOPHEN 325 MG PO TABS
650.0000 mg | ORAL_TABLET | ORAL | Status: DC | PRN
  Administered 2016-10-16 (×2): 650 mg via ORAL
  Filled 2016-10-16 (×2): qty 2

## 2016-10-16 MED ORDER — BUPRENORPHINE HCL 8 MG SL SUBL
8.0000 mg | SUBLINGUAL_TABLET | SUBLINGUAL | Status: DC
  Administered 2016-10-16 – 2016-10-18 (×3): 8 mg via SUBLINGUAL
  Filled 2016-10-16 (×3): qty 1

## 2016-10-16 MED ORDER — LIDOCAINE HCL (PF) 1 % IJ SOLN
INTRAMUSCULAR | Status: DC | PRN
  Administered 2016-10-16 (×2): 4 mL via EPIDURAL

## 2016-10-16 MED ORDER — OXYTOCIN 40 UNITS IN LACTATED RINGERS INFUSION - SIMPLE MED: 2.5000 [IU]/h | INTRAVENOUS | Status: DC | PRN

## 2016-10-16 MED ORDER — OXYTOCIN 40 UNITS IN LACTATED RINGERS INFUSION - SIMPLE MED: 2.5000 [IU]/h | INTRAVENOUS | Status: DC

## 2016-10-16 MED ORDER — OXYCODONE-ACETAMINOPHEN 5-325 MG PO TABS: 1.0000 | ORAL_TABLET | ORAL | Status: DC | PRN

## 2016-10-16 MED ORDER — BUPRENORPHINE HCL 8 MG SL SUBL
8.0000 mg | SUBLINGUAL_TABLET | Freq: Once | SUBLINGUAL | Status: AC
  Administered 2016-10-16: 8 mg via SUBLINGUAL
  Filled 2016-10-16: qty 1

## 2016-10-16 MED ORDER — COCONUT OIL OIL: 1.0000 "application " | TOPICAL_OIL | Status: DC | PRN

## 2016-10-16 MED ORDER — WITCH HAZEL-GLYCERIN EX PADS
1.0000 "application " | MEDICATED_PAD | CUTANEOUS | Status: DC | PRN
  Administered 2016-10-16: 1 via TOPICAL

## 2016-10-16 MED ORDER — IBUPROFEN 600 MG PO TABS
600.0000 mg | ORAL_TABLET | Freq: Four times a day (QID) | ORAL | Status: DC
  Administered 2016-10-16 – 2016-10-18 (×10): 600 mg via ORAL
  Filled 2016-10-16 (×11): qty 1

## 2016-10-16 MED ORDER — METHYLERGONOVINE MALEATE 0.2 MG PO TABS: 0.2000 mg | ORAL_TABLET | ORAL | Status: DC | PRN

## 2016-10-16 MED ORDER — TETANUS-DIPHTH-ACELL PERTUSSIS 5-2.5-18.5 LF-MCG/0.5 IM SUSP
0.5000 mL | Freq: Once | INTRAMUSCULAR | Status: AC
  Administered 2016-10-18: 0.5 mL via INTRAMUSCULAR

## 2016-10-16 MED ORDER — MEASLES, MUMPS & RUBELLA VAC ~~LOC~~ INJ
0.5000 mL | INJECTION | Freq: Once | SUBCUTANEOUS | Status: DC
  Filled 2016-10-16: qty 0.5

## 2016-10-16 MED ORDER — FENTANYL 2.5 MCG/ML BUPIVACAINE 1/10 % EPIDURAL INFUSION (WH - ANES)
14.0000 mL/h | INTRAMUSCULAR | Status: DC | PRN
  Administered 2016-10-16: 14 mL/h via EPIDURAL

## 2016-10-16 MED ORDER — METHYLERGONOVINE MALEATE 0.2 MG/ML IJ SOLN: 0.2000 mg | INTRAMUSCULAR | Status: DC | PRN

## 2016-10-16 MED ORDER — SIMETHICONE 80 MG PO CHEW: 80.0000 mg | CHEWABLE_TABLET | ORAL | Status: DC | PRN

## 2016-10-16 MED ORDER — ACETAMINOPHEN 325 MG PO TABS: 650.0000 mg | ORAL_TABLET | ORAL | Status: DC | PRN

## 2016-10-16 MED ORDER — OXYCODONE-ACETAMINOPHEN 5-325 MG PO TABS: 2.0000 | ORAL_TABLET | ORAL | Status: DC | PRN

## 2016-10-16 MED ORDER — DIPHENHYDRAMINE HCL 50 MG/ML IJ SOLN: 12.5000 mg | INTRAMUSCULAR | Status: DC | PRN

## 2016-10-16 MED ORDER — OXYCODONE HCL 5 MG PO TABS: 5.0000 mg | ORAL_TABLET | ORAL | Status: DC | PRN

## 2016-10-16 MED ORDER — BUPRENORPHINE HCL 2 MG SL SUBL
8.0000 mg | SUBLINGUAL_TABLET | SUBLINGUAL | Status: DC
  Administered 2016-10-17: 8 mg via SUBLINGUAL
  Filled 2016-10-16: qty 4

## 2016-10-16 MED ORDER — DIPHENHYDRAMINE HCL 25 MG PO CAPS: 25.0000 mg | ORAL_CAPSULE | Freq: Four times a day (QID) | ORAL | Status: DC | PRN

## 2016-10-16 MED ORDER — LACTATED RINGERS IV SOLN: INTRAVENOUS | Status: DC

## 2016-10-16 MED ORDER — ONDANSETRON HCL 4 MG/2ML IJ SOLN: 4.0000 mg | INTRAMUSCULAR | Status: DC | PRN

## 2016-10-16 MED ORDER — BENZOCAINE-MENTHOL 20-0.5 % EX AERO
1.0000 "application " | INHALATION_SPRAY | CUTANEOUS | Status: DC | PRN
  Administered 2016-10-16: 1 via TOPICAL
  Filled 2016-10-16: qty 56

## 2016-10-16 MED ORDER — LACTATED RINGERS IV SOLN: 500.0000 mL | INTRAVENOUS | Status: DC | PRN

## 2016-10-16 MED ORDER — METOCLOPRAMIDE HCL 10 MG PO TABS
10.0000 mg | ORAL_TABLET | Freq: Once | ORAL | Status: AC
  Administered 2016-10-17: 10 mg via ORAL
  Filled 2016-10-16: qty 1

## 2016-10-16 MED ORDER — ONDANSETRON HCL 4 MG PO TABS: 4.0000 mg | ORAL_TABLET | ORAL | Status: DC | PRN

## 2016-10-16 MED ORDER — BUPRENORPHINE HCL 8 MG SL SUBL
4.0000 mg | SUBLINGUAL_TABLET | Freq: Every day | SUBLINGUAL | Status: DC
  Administered 2016-10-16 – 2016-10-17 (×2): 4 mg via SUBLINGUAL
  Filled 2016-10-16 (×2): qty 1

## 2016-10-16 MED ORDER — PRENATAL MULTIVITAMIN CH
1.0000 | ORAL_TABLET | Freq: Every day | ORAL | Status: DC
  Administered 2016-10-16 – 2016-10-17 (×2): 1 via ORAL
  Filled 2016-10-16 (×3): qty 1

## 2016-10-16 MED ORDER — PHENYLEPHRINE 40 MCG/ML (10ML) SYRINGE FOR IV PUSH (FOR BLOOD PRESSURE SUPPORT)
PREFILLED_SYRINGE | INTRAVENOUS | Status: AC
  Filled 2016-10-16: qty 20

## 2016-10-16 MED ORDER — FAMOTIDINE 20 MG PO TABS
40.0000 mg | ORAL_TABLET | Freq: Once | ORAL | Status: AC
  Administered 2016-10-17: 40 mg via ORAL
  Filled 2016-10-16: qty 2

## 2016-10-16 MED ORDER — PHENYLEPHRINE 40 MCG/ML (10ML) SYRINGE FOR IV PUSH (FOR BLOOD PRESSURE SUPPORT)
80.0000 ug | PREFILLED_SYRINGE | INTRAVENOUS | Status: DC | PRN
  Filled 2016-10-16: qty 5

## 2016-10-16 MED ORDER — LIDOCAINE HCL (PF) 1 % IJ SOLN
30.0000 mL | INTRAMUSCULAR | Status: DC | PRN
Start: 2016-10-16 — End: 2016-10-16
  Filled 2016-10-16: qty 30

## 2016-10-16 MED ORDER — DIBUCAINE 1 % RE OINT
1.0000 "application " | TOPICAL_OINTMENT | RECTAL | Status: DC | PRN
  Administered 2016-10-16: 1 via RECTAL
  Filled 2016-10-16: qty 28

## 2016-10-16 MED ORDER — LIDOCAINE HCL (PF) 1 % IJ SOLN
INTRAMUSCULAR | Status: AC
  Filled 2016-10-16: qty 30

## 2016-10-16 MED ORDER — SOD CITRATE-CITRIC ACID 500-334 MG/5ML PO SOLN: 30.0000 mL | ORAL | Status: DC | PRN

## 2016-10-16 MED ORDER — OXYTOCIN 40 UNITS IN LACTATED RINGERS INFUSION - SIMPLE MED
INTRAVENOUS | Status: AC
  Filled 2016-10-16: qty 1000

## 2016-10-16 MED ORDER — ZOLPIDEM TARTRATE 5 MG PO TABS: 5.0000 mg | ORAL_TABLET | Freq: Every evening | ORAL | Status: DC | PRN

## 2016-10-16 MED ORDER — FLEET ENEMA 7-19 GM/118ML RE ENEM: 1.0000 | ENEMA | RECTAL | Status: DC | PRN

## 2016-10-16 MED ORDER — SENNOSIDES-DOCUSATE SODIUM 8.6-50 MG PO TABS
2.0000 | ORAL_TABLET | ORAL | Status: DC
  Administered 2016-10-16 – 2016-10-18 (×2): 2 via ORAL
  Filled 2016-10-16 (×2): qty 2

## 2016-10-16 NOTE — MAU Note (Signed)
Contractions since 0200, no leaking of fluid or bleeding

## 2016-10-16 NOTE — Anesthesia Preprocedure Evaluation (Signed)
Anesthesia Evaluation  Patient identified by MRN, date of birth, ID band Patient awake    Reviewed: Allergy & Precautions, Patient's Chart, lab work & pertinent test results  Airway Mallampati: I       Dental  (+) Teeth Intact   Pulmonary Current Smoker,    breath sounds clear to auscultation       Cardiovascular negative cardio ROS   Rhythm:Regular Rate:Normal     Neuro/Psych negative neurological ROS  negative psych ROS   GI/Hepatic negative GI ROS, (+) Hepatitis -, C  Endo/Other  negative endocrine ROS  Renal/GU negative Renal ROS  negative genitourinary   Musculoskeletal negative musculoskeletal ROS (+)   Abdominal   Peds negative pediatric ROS (+)  Hematology negative hematology ROS (+)   Anesthesia Other Findings   Reproductive/Obstetrics (+) Pregnancy                             Lab Results  Component Value Date   WBC 10.4 10/16/2016   HGB 12.0 10/16/2016   HCT 34.9 (L) 10/16/2016   MCV 91.1 10/16/2016   PLT 212 10/16/2016   No results found for: INR, PROTIME   Anesthesia Physical Anesthesia Plan  ASA: II  Anesthesia Plan: Epidural   Post-op Pain Management:    Induction:   Airway Management Planned:   Additional Equipment:   Intra-op Plan:   Post-operative Plan:   Informed Consent: I have reviewed the patients History and Physical, chart, labs and discussed the procedure including the risks, benefits and alternatives for the proposed anesthesia with the patient or authorized representative who has indicated his/her understanding and acceptance.     Plan Discussed with:   Anesthesia Plan Comments:         Anesthesia Quick Evaluation

## 2016-10-16 NOTE — Anesthesia Postprocedure Evaluation (Signed)
Anesthesia Post Note  Patient: [name]  Procedure(s) Performed: * No procedures listed *  Patient location during evaluation: Mother Baby Anesthesia Type: Epidural Level of consciousness: awake and alert Pain management: pain level controlled Vital Signs Assessment: post-procedure vital signs reviewed and stable Respiratory status: spontaneous breathing and nonlabored ventilation Cardiovascular status: stable Postop Assessment: no headache, no backache, patient able to bend at knees, epidural receding, no signs of nausea or vomiting and adequate PO intake Anesthetic complications: no     Last Vitals:  Vitals:   10/16/16 0849 10/16/16 1339  BP: 113/68 (!) 96/54  Pulse: 71 63  Resp: 18   Temp: 37.2 C 37.3 C    Last Pain:  Vitals:   10/16/16 0849  TempSrc: Oral  PainSc:    Pain Goal:                 [name],[name]

## 2016-10-16 NOTE — Progress Notes (Signed)
Drank an additional 2 boxes of Apple Juice

## 2016-10-16 NOTE — Anesthesia Procedure Notes (Signed)
Epidural Patient location during procedure: OB Start time: 10/16/2016 5:58 AM End time: 10/16/2016 6:05 AM  Staffing Anesthesiologist: Shona SimpsonHOLLIS, Victor Langenbach D Performed: anesthesiologist   Preanesthetic Checklist Completed: patient identified, site marked, surgical consent, pre-op evaluation, timeout performed, IV checked, risks and benefits discussed and monitors and equipment checked  Epidural Patient position: sitting Prep: ChloraPrep Patient monitoring: heart rate, continuous pulse ox and blood pressure Approach: midline Location: L3-L4 Injection technique: LOR saline  Needle:  Needle type: Tuohy  Needle gauge: 17 G Needle length: 9 cm Catheter type: closed end flexible Catheter size: 20 Guage Test dose: negative and 1.5% lidocaine  Assessment Events: blood not aspirated, injection not painful, no injection resistance and no paresthesia  Additional Notes LOR @ 5  Patient identified. Risks/Benefits/Options discussed with patient including but not limited to bleeding, infection, nerve damage, paralysis, failed block, incomplete pain control, headache, blood pressure changes, nausea, vomiting, reactions to medications, itching and postpartum back pain. Confirmed with bedside nurse the patient's most recent platelet count. Confirmed with patient that they are not currently taking any anticoagulation, have any bleeding history or any family history of bleeding disorders. Patient expressed understanding and wished to proceed. All questions were answered. Sterile technique was used throughout the entire procedure. Please see nursing notes for vital signs. Test dose was given through epidural catheter and negative prior to continuing to dose epidural or start infusion. Warning signs of high block given to the patient including shortness of breath, tingling/numbness in hands, complete motor block, or any concerning symptoms with instructions to call for help. Patient was given instructions on  fall risk and not to get out of bed. All questions and concerns addressed with instructions to call with any issues or inadequate analgesia.    Reason for block:procedure for pain

## 2016-10-16 NOTE — H&P (Signed)
LABOR AND DELIVERY ADMISSION HISTORY AND PHYSICAL NOTE  [name] is a 25 y.o. female G2P1001 with IUP at [redacted]w[redacted]d by LMP and 20 week [place] presenting for SOL.   Has been feeling contractions since 2am. No gross rupture of membranes.  She reports positive fetal movement. She denies leakage of fluid or vaginal bleeding. 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 Took last dose of Subutex yesterday evening, takes 2.5 of the 8mg  SL daily. Has not been tapered through pregnancy, started on subutex once she found out she was pregnant.  Prenatal History/Complications:  Past Medical History: Past Medical History:  Diagnosis Date  . History of RPR test 07/21/2016   02/2016: 1:1 with negative TPA testing [ ]  f/u 28wks [ ]  f/u admit RPR [ ]  f/u PP RPR    Past Surgical History: Past Surgical History:  Procedure Laterality Date  . TONSILLECTOMY      Obstetrical History: OB History    Gravida Para Term Preterm AB Living   2 1 1     1    SAB TAB Ectopic Multiple Live Births           1      Social History: Social History   Social History  . Marital status: Single    Spouse name: N/A  . Number of children: N/A  . Years of education: N/A   Social History Main Topics  . Smoking status: Current Every Day Smoker    Packs/day: 0.00    Types: Cigarettes  . Smokeless tobacco: Never Used  . Alcohol use No  . Drug use: No  . Sexual activity: Yes    Partners: Male   Other Topics Concern  . None   Social History Narrative  . None    Family History: Family History  Problem Relation Age of Onset  . Vision loss Mother   . Depression Mother   . Ulcers Mother   . Gallbladder disease Mother   . Mental illness Mother   . Thyroid disease Mother   . Allergies Father   . Vision loss Father   . Vision loss Maternal Grandmother   . Ulcers Maternal Grandmother   . Gallbladder disease Maternal Grandmother   . Diabetes Maternal  Grandmother   . Vision loss Maternal Grandfather   . Heart disease Maternal Grandfather   . Cancer Maternal Grandfather   . Allergies Paternal Grandmother   . Vision loss Paternal Grandmother   . Gallbladder disease Paternal Grandmother   . Arthritis Paternal Grandmother   . Cancer Paternal Grandmother   . Allergies Paternal Grandfather   . Vision loss Paternal Grandfather     Allergies: Allergies  Allergen Reactions  . Dextromethorphan Swelling    Prescriptions Prior to Admission  Medication Sig Dispense Refill Last Dose  . buprenorphine (SUBUTEX) 8 MG SUBL SL tablet Place 6-8 mg under the tongue 2 (two) times daily.   Taking  . Buprenorphine HCl (SUBUTEX SL) Place 4 mg under the tongue daily.   Taking  . Docosahexaenoic Acid (DHA PO) Take 1 tablet by mouth daily.   Taking  . ferrous sulfate 325 (65 FE) MG tablet Take 325 mg by mouth daily.  6 Taking  . Prenatal Vit-Fe Fumarate-FA (PRENATAL MULTIVITAMIN) TABS tablet Take 1 tablet by mouth daily at 12 noon.   Taking     Review of Systems   All systems reviewed and negative except as stated in HPI  Last menstrual period 01/07/2016.  General appearance: alert, cooperative and mild distress Lungs: no respiratory distress Heart: regular rate Abdomen: soft, non-tender Extremities: No calf swelling or tenderness Presentation: cephalic by nurse exam Fetal monitoring: baseline 125, moderate variability, accelerations present, no decelerations Uterine activity: q2 minutes Dilation: 5 Effacement (%): 90 Exam by:: Nancee LiterLynette Weston   Prenatal labs: ABO, Rh: --/--/A POS (07/22 0503) Antibody: Positive (03/08 0000) Rubella: immune RPR: Non Reactive (07/31 1135)  HBsAg: Negative (03/08 0000)  HIV: Non Reactive (07/31 1135)  GBS: Negative (09/19 1113)   Prenatal Transfer Tool  Maternal Diabetes: No Genetic Screening: Declined Maternal Ultrasounds/Referrals: Normal Fetal Ultrasounds or other Referrals:  Referred to Materal  Fetal Medicine  Maternal Substance Abuse:  Yes:  Type: Smoker, Other: Opioids Significant Maternal Medications:  Meds include: Other: Subutex Significant Maternal Lab Results: Lab values include: Other: Hepatitis C positive  No results found for this or any previous visit (from the past 24 hour(s)).  Patient Active Problem List   Diagnosis Date Noted  . Cigarette smoker 08/18/2016  . Pregnancy complicated by subutex maintenance, antepartum (HCC) 08/04/2016  . Hepatitis C, chronic, maternal, antepartum (HCC) 08/04/2016  . Supervision of high-risk pregnancy 07/21/2016  . History of substance abuse 07/21/2016  . ASCUS with positive high risk HPV 07/21/2016    Assessment: [name] is a 25 y.o. G2P1001 at 2539w3d here for SOL.   #Labor: SOL, membranes in tact #Pain: Desires epidural #FWB: Category 1 tracing #ID:  GBS negative #MOF: undecided #MOC:BTL- papers signed 08/04/16 #Circ:  n/a  Tillman SersAngela C Riccio, DO PGY-1 10/20/20175:30 AM  I agree with the above.

## 2016-10-17 ENCOUNTER — Encounter (HOSPITAL_COMMUNITY): Payer: Self-pay | Admitting: Certified Registered Nurse Anesthetist

## 2016-10-17 ENCOUNTER — Encounter (HOSPITAL_COMMUNITY)
Admission: AD | Disposition: A | Discharge status: Home / Self Care | Payer: Self-pay | Source: Ambulatory Visit | Attending: Obstetrics & Gynecology

## 2016-10-17 SURGERY — LIGATION, FALLOPIAN TUBE, POSTPARTUM
Anesthesia: Choice

## 2016-10-17 MED ORDER — PNEUMOCOCCAL VAC POLYVALENT 25 MCG/0.5ML IJ INJ
0.5000 mL | INJECTION | INTRAMUSCULAR | Status: AC
  Administered 2016-10-18: 0.5 mL via INTRAMUSCULAR
  Filled 2016-10-17 (×2): qty 0.5

## 2016-10-17 NOTE — Progress Notes (Signed)
Faculty Practice OB/GYN Attending Note  25 y.o. [id number] PPD#1 s/p SVD who desires permanent sterilization.  Other reversible forms of contraception were discussed with patient; she declines all other modalities. Risks of procedure discussed with patient including but not limited to: risk of regret, permanence of method, bleeding, infection, injury to surrounding organs and need for additional procedures.  Failure risk of 1-2 % with increased risk of ectopic gestation if pregnancy occurs was also discussed with patient.  Patient verbalized understanding of these risks and wants to proceed with sterilization.  Written informed consent obtained.  To OR when ready.   Jaynie Collins, MD, FACOG Attending Obstetrician & Gynecologist Faculty Practice, [facility]

## 2016-10-17 NOTE — Progress Notes (Signed)
POSTPARTUM PROGRESS NOTE  Post Partum Day 1 Subjective:  [name] is a 25 y.o. [id number] 718w3d s/p SVD.  No acute events overnight.  Pt denies problems with ambulating, voiding or po intake.  She denies nausea or vomiting.  Pain is moderately controlled.  She has had flatus. She has not had bowel movement.  Lochia Small.   Objective: Blood pressure (!) 108/59, pulse (!) 51, temperature 98.3 F (36.8 C), temperature source Oral, resp. rate 15, height 5' (1.524 m), weight 125 lb (56.7 kg), last menstrual period 01/07/2016, SpO2 98 %, unknown if currently breastfeeding.  Physical Exam:  General: alert, cooperative and no distress Lochia:normal flow Chest: no respiratory distress Heart:regular rate, distal pulses intact Abdomen: soft, nontender,  Uterine Fundus: firm, appropriately tender DVT Evaluation: No calf swelling or tenderness Extremities: trace edema   Recent Labs  10/16/16 0525  HGB 12.0  HCT 34.9*    Assessment/Plan:  ASSESSMENT: Holly RoanCarey M Sloan is a 25 y.o. [id number] 3318w3d s/p SVD  Post Partum Tubal Ligation today.  Plan for discharge tomorrow, Breastfeeding and Contraception Nexplanon   LOS: 1 day   Les Pouicholas SchenkMD 10/17/2016, 7:42 AM

## 2016-10-17 NOTE — Progress Notes (Signed)
Faculty Practice OB/GYN Attending Note  Patient has now decided not to undergo BTS, will talk to FOB about vasectomy. Will use Nexplanon in the meantime.  Procedure cancelled, will continue routine postpartum care.   Holly CollinsUGONNA  Jian Hodgman, MD, FACOG Attending Obstetrician & Gynecologist Faculty Practice, [facility]

## 2016-10-17 NOTE — Anesthesia Preprocedure Evaluation (Deleted)
Anesthesia Evaluation  Patient identified by MRN, date of birth, ID band Patient awake    Reviewed: Allergy & Precautions, NPO status , Patient's Chart, lab work & pertinent test results  History of Anesthesia Complications Negative for: history of anesthetic complications  Airway Mallampati: II  TM Distance: >3 FB Neck ROM: Full    Dental  (+) Teeth Intact, Dental Advisory Given   Pulmonary Current Smoker,    Pulmonary exam normal breath sounds clear to auscultation       Cardiovascular Exercise Tolerance: Good negative cardio ROS Normal cardiovascular exam Rhythm:Regular Rate:Normal     Neuro/Psych negative neurological ROS     GI/Hepatic negative GI ROS, (+) Hepatitis -, C  Endo/Other  negative endocrine ROS  Renal/GU negative Renal ROS     Musculoskeletal negative musculoskeletal ROS (+)   Abdominal   Peds  Hematology negative hematology ROS (+)   Anesthesia Other Findings Day of surgery medications reviewed with the patient.  Reproductive/Obstetrics S/p SVD 10/16/16 Epidural in situ                             Anesthesia Physical Anesthesia Plan  ASA: II  Anesthesia Plan: Epidural   Post-op Pain Management:    Induction:   Airway Management Planned: Nasal Cannula  Additional Equipment:   Intra-op Plan:   Post-operative Plan:   Informed Consent: I have reviewed the patients History and Physical, chart, labs and discussed the procedure including the risks, benefits and alternatives for the proposed anesthesia with the patient or authorized representative who has indicated his/her understanding and acceptance.   Dental advisory given  Plan Discussed with: CRNA  Anesthesia Plan Comments: (Epidural in situ--will attempt to achieve adequate level in pre-op.  If unsuccessful, will proceed with spinal anesthesia.  Risks, benefits, alternatives discussed with patients.   All questions answered.  Patient wishes to proceed.)        Anesthesia Quick Evaluation

## 2016-10-17 NOTE — Clinical Social Work Maternal (Signed)
CLINICAL SOCIAL WORK MATERNAL/CHILD NOTE  Patient Details  Name: [name] MRN: [id number] Date of Birth: 10/16/2017  Date:  10/17/2016  Clinical Social Worker Initiating Note:   , MSW. LCSW-A  Date/ Time Initiated:  10/17/16/1424         Child's Name:  [name]    Legal Guardian:  Other (Comment) (Not established by court system; MOB and FOB parent collectively )   Need for Interpreter:  None   Date of Referral:  10/17/16     Reason for Referral:  Other (Comment) (MOB currently on Subutex)   Referral Source:  Physician   Address:  [address] [place], [place] [address]  Phone number:  [phone]   Household Members: Self, Significant Other   Natural Supports (not living in the home): Immediate Family   Professional Supports:    Employment:    Type of Work:     Education:  9 to 11 years   Financial Resources:Medicaid   Other Resources:     Cultural/Religious Considerations Which May Impact Care: None reported at this time.   Strengths: Ability to meet basic needs , Compliance with medical plan , Pediatrician chosen , Home prepared for child    Risk Factors/Current Problems: Substance Use    Cognitive State: Alert , Goal Oriented    Mood/Affect: Calm , Comfortable , Interested    CSW Assessment:CSW met with MOB at bedside to complete assessment. MOB presentation was very clam and pleasant. Baby and FOB were asleep. This writer explained role and reasoning for visit being due to MOB subutex. This writer informed MOB of hospitals policy and procedure. This writer explained that baby's UDS and cord blood would be tested for substance. MOB verbalized understanding. This writer informed MOB that a UDS has not been collected yet as baby has not urinated. This writer discussed PPD and SIDS. At this time, MOB notes no questions or concerns. CSW will continue to foloow pending cord blood/  UDS test.   CSW Plan/Description: Psychosocial Support and Ongoing Assessment of Needs    , MSW, LCSW-A Clinical Social Worker  [facility]  Office: [phone]   

## 2016-10-17 NOTE — Lactation Note (Signed)
This note was copied from a baby's chart. Lactation Consultation Note  Baby is 6731 HOL and has just voided. Observed BF with many swallows.  Taught hand expression with colostrum visible. Mom's nipples were tender and intact. Tenderness improved with positioning adjustments.  Reviewed need to BF often to help baby's withdrawal symptoms. Follow-up tomorrow. Information given on support groups and OP services.  Patient Name: Girl Lupita RaiderCarey Nanna ZOXWR'UToday's Date: 10/17/2016 Reason for consult: Initial assessment   Maternal Data Has patient been taught Hand Expression?: Yes  Feeding Feeding Type: Breast Fed Length of feed: 15 min  LATCH Score/Interventions Latch: Repeated attempts needed to sustain latch, nipple held in mouth throughout feeding, stimulation needed to elicit sucking reflex.  Audible Swallowing: Spontaneous and intermittent  Type of Nipple: Everted at rest and after stimulation  Comfort (Breast/Nipple): Filling, red/small blisters or bruises, mild/mod discomfort (tender)     Hold (Positioning): Assistance needed to correctly position infant at breast and maintain latch.  LATCH Score: 7  Lactation Tools Discussed/Used     Consult Status Consult Status: Follow-up Date: 10/18/16 Follow-up type: In-patient    Soyla DryerJoseph, Ayinde Swim 10/17/2016, 2:41 PM

## 2016-10-18 MED ORDER — BUPRENORPHINE HCL 8 MG SL SUBL: 8.0000 mg | SUBLINGUAL_TABLET | Freq: Every day | SUBLINGUAL | 0 refills | Status: DC

## 2016-10-18 MED ORDER — BUPRENORPHINE HCL 8 MG SL SUBL
8.0000 mg | SUBLINGUAL_TABLET | Freq: Every day | SUBLINGUAL | Status: DC
  Administered 2016-10-18: 8 mg via SUBLINGUAL
  Filled 2016-10-18: qty 1

## 2016-10-18 MED ORDER — IBUPROFEN 600 MG PO TABS: 600.0000 mg | ORAL_TABLET | Freq: Four times a day (QID) | ORAL | 0 refills | Status: DC

## 2016-10-18 NOTE — Discharge Summary (Signed)
OB Discharge Summary  Patient Name: [name] DOB: 1991/11/07 MRN: [id number]  Date of admission: 10/16/2016 Delivering MD: Jacklyn Shell   Date of discharge: 10/18/2016  Admitting diagnosis: 40wks, contractions Desires Sterilization Intrauterine pregnancy: [redacted]w[redacted]d     Secondary diagnosis:Active Problems:   Normal labor  Additional problems:GHTN     Discharge diagnosis: Term Pregnancy Delivered                                                                     Post partum procedures:none  Augmentation: none  Complications: None  Hospital course:  Onset of Labor With Vaginal Delivery     25 y.o. yo [id number] at [redacted]w[redacted]d was admitted in Active Labor on 10/16/2016. Patient had an uncomplicated labor course as follows:  Membrane Rupture Time/Date: 7:00 AM ,10/16/2016   Intrapartum Procedures: Episiotomy: None [1]                                         Lacerations:  1st degree [2]  Patient had a delivery of a Viable infant. 10/16/2016  Information for the patient's newborn:  Jabrea, Kallstrom [[id number]]  Delivery Method: Vaginal, Spontaneous Delivery (Filed from Delivery Summary)    Pateint had an uncomplicated postpartum course.  She is ambulating, tolerating a regular diet, passing flatus, and urinating well. Patient is discharged home in stable condition on 10/18/16.    Physical exam Vitals:   10/16/16 1805 10/17/16 0602 10/17/16 1845 10/18/16 0639  BP: (!) 95/54 (!) 108/59 110/65 117/66  Pulse: 72 (!) 51 62 61  Resp: 18 15 18 16   Temp:  98.3 F (36.8 C) 98.2 F (36.8 C) 97.5 F (36.4 C)  TempSrc:  Oral Oral Oral  SpO2:      Weight:      Height:       General: alert, cooperative and no distress Lochia: appropriate Uterine Fundus: firm Incision: N/A DVT Evaluation: No evidence of DVT seen on physical exam. Labs: Lab Results  Component Value Date   WBC 10.4 10/16/2016   HGB 12.0 10/16/2016   HCT 34.9 (L) 10/16/2016   MCV 91.1  10/16/2016   PLT 212 10/16/2016   CMP Latest Ref Rng & Units 09/15/2016  Glucose 65 - 99 mg/dL 73  BUN 6 - 20 mg/dL 4(L)  Creatinine 1.30 - 1.00 mg/dL 8.65  Sodium 784 - 696 mmol/L 135  Potassium 3.5 - 5.2 mmol/L 4.1  Chloride 96 - 106 mmol/L 99  CO2 18 - 29 mmol/L 23  Calcium 8.7 - 10.2 mg/dL 2.9(B)  Total Protein 6.0 - 8.5 g/dL 2.8(U)  Total Bilirubin 0.0 - 1.2 mg/dL 0.3  Alkaline Phos 39 - 117 IU/L 143(H)  AST 0 - 40 IU/L 59(H)  ALT 0 - 32 IU/L 87(H)    Discharge instruction: per After Visit Summary and "Baby and Me Booklet".  After Visit Meds:    Medication List    TAKE these medications   buprenorphine 8 MG Subl SL tablet Commonly known as:  SUBUTEX Place 4-8 mg under the tongue 3 (three) times daily. Place 1 tablet under tongue in the morning, in the  afternoon and 1/2 tablet at bedtime   DHA PO Take 1 tablet by mouth daily.   ferrous sulfate 325 (65 FE) MG tablet Take 325 mg by mouth daily.   ibuprofen 600 MG tablet Commonly known as:  ADVIL,MOTRIN Take 1 tablet (600 mg total) by mouth every 6 (six) hours.   prenatal multivitamin Tabs tablet Take 1 tablet by mouth daily at 12 noon.       Diet: routine diet  Activity: Advance as tolerated. Pelvic rest for 6 weeks.   Outpatient follow up:6 weeks Follow up Appt:Future Appointments Date Time Provider Department Center  10/20/2016 7:00 AM [facility]ED ROOM [facility] None   Follow up visit: No Follow-up on file.  Postpartum contraception: Nexplanon  Newborn Data: Live born female  Birth Weight: 6 lb 14.9 oz (3144 g) APGAR: 8, 9  Baby Feeding: Bottle Disposition:home with mother   10/18/2016 Wyvonnia DuskyMarie Lottie Sigman, CNM

## 2016-10-18 NOTE — Progress Notes (Signed)
CSW followed up on UDS results for baby. Baby's UDS came back positive for THC. This writer contacted [place] intake worker Charles T. To make report. CSW will continue to follow [pending cord blood test results.   Mashal Slavick, MSW, LCSW-A Clinical Social Worker  [facility]  Office: [phone]   

## 2016-10-18 NOTE — Lactation Note (Signed)
This note was copied from a baby's chart. Lactation Consultation Note  Patient Name: Girl Lupita RaiderCarey Rayle ZOXWR'UToday's Date: 10/18/2016 Reason for consult: Follow-up assessment;Infant weight loss Baby is 7457 hours old and at a 9% weight loss.  Mom feels like baby is latching well most of the time.  Breasts are full.  Recommended mom post pump to give additional calories to the baby.  She is agreeable.  Symphony pump set up and initiated.  Mother taught pump operation, cleaning and expressed milk storage guidelines.  Observed the first five minutes of pumping and milk flowing easily.  Mom will call nurse to assist with finger feeding or cup feeding.  Instructed to feed with any feeding cue, post pump every 3 hours and give baby expressed milk.  Encouraged to call with concerns or latch assist.  Maternal Data    Feeding Length of feed: 20 min  LATCH Score/Interventions                      Lactation Tools Discussed/Used Pump Review: Setup, frequency, and cleaning;Milk Storage Initiated by:: LC Date initiated:: 10/18/16   Consult Status Consult Status: Follow-up Date: 10/19/16 Follow-up type: In-patient    [name], Jesper Stirewalt S 10/18/2016, 4:26 PM

## 2016-10-19 ENCOUNTER — Ambulatory Visit: Payer: Self-pay

## 2016-10-19 NOTE — Lactation Note (Signed)
This note was copied from a baby's chart. Lactation Consultation Note  Patient Name: [name] UJWJX'BToday's Date: [name] Reason for consult: Follow-up assessment Mom states feedings are going well but nipples are very sore.  Breasts are full and both nipples have small positional stripes.  Assisted mom with postioning baby in football hold.  Mom shown good breast compression for a deeper latch.  Baby latched easily and nursed actively with many gulps.  Mom reports feeding is comfortable.  Instructed to use good breast massage during feeding.  Encouraged to call for assist prn.  Maternal Data    Feeding Feeding Type: Breast Fed Length of feed: 10 min  LATCH Score/Interventions Latch: Grasps breast easily, tongue down, lips flanged, rhythmical sucking. Intervention(s): Skin to skin;Teach feeding cues;Waking techniques Intervention(s): Breast compression;Breast massage;Assist with latch;Adjust position  Audible Swallowing: Spontaneous and intermittent Intervention(s): Skin to skin;Hand expression;Alternate breast massage  Type of Nipple: Everted at rest and after stimulation  Comfort (Breast/Nipple): Filling, red/small blisters or bruises, mild/mod discomfort  Problem noted: Mild/Moderate discomfort;Cracked, bleeding, blisters, bruises Interventions  (Cracked/bleeding/bruising/blister): Expressed breast milk to nipple Interventions (Mild/moderate discomfort): Comfort gels  Hold (Positioning): Assistance needed to correctly position infant at breast and maintain latch. Intervention(s): Breastfeeding basics reviewed;Support Pillows;Position options;Skin to skin  LATCH Score: 8  Lactation Tools Discussed/Used     Consult Status Consult Status: Follow-up Date: 10/20/16 Follow-up type: In-patient    [name], [name], [name]

## 2016-10-20 ENCOUNTER — Inpatient Hospital Stay (HOSPITAL_COMMUNITY): Admission: RE | Admit: 2016-10-20 | Payer: No Typology Code available for payment source | Source: Ambulatory Visit

## 2016-10-30 MED FILL — BUPRENORPHINE 8 MG TAB SL: 8 | 28 days supply | Qty: 84 | Fill #0

## 2016-11-27 MED FILL — BUPRENORPHINE 8 MG TAB SL: 8 | 7 days supply | Qty: 21 | Fill #0

## 2016-12-03 MED FILL — BUPRENORPHINE 8 MG TAB SL: 8 | 7 days supply | Qty: 21 | Fill #0

## 2016-12-10 MED FILL — BUPRENORPHINE 8 MG TAB SL: 8 | 7 days supply | Qty: 21 | Fill #0

## 2016-12-17 MED FILL — BUPRENORPHINE 8 MG TAB SL: 8 | 7 days supply | Qty: 21 | Fill #0

## 2016-12-24 MED FILL — BUPRENORPHINE 8 MG TAB SL: 8 | 7 days supply | Qty: 21 | Fill #0

## 2016-12-31 MED FILL — BUPRENORPHINE 8 MG TAB SL: 8 | 8 days supply | Qty: 24 | Fill #0

## 2017-01-08 MED FILL — BUPRENORPHINE 8 MG TAB SL: 8 | 8 days supply | Qty: 24 | Fill #0

## 2017-01-15 MED FILL — BUPRENORPHINE 8 MG TAB SL: 8 | 7 days supply | Qty: 21 | Fill #0

## 2017-01-20 ENCOUNTER — Ambulatory Visit: Payer: Medicaid Other | Admitting: Obstetrics & Gynecology

## 2017-01-21 MED FILL — BUPRENORPHINE 8 MG TAB SL: 8 | 14 days supply | Qty: 42 | Fill #0

## 2017-02-04 MED FILL — BUPRENORPHINE 8 MG TABLET S: 8 | 7 days supply | Qty: 21 | Fill #0

## 2017-02-11 ENCOUNTER — Ambulatory Visit: Payer: Medicaid Other | Admitting: Obstetrics

## 2017-02-11 MED FILL — BUPRENORPHINE 8 MG TABLET S: 8 | 7 days supply | Qty: 21 | Fill #0

## 2017-02-25 MED FILL — BUPRENORPHINE 8 MG TABLET S: 8 | 7 days supply | Qty: 21 | Fill #0

## 2017-08-29 ENCOUNTER — Encounter (HOSPITAL_COMMUNITY): Payer: Self-pay | Admitting: Emergency Medicine

## 2017-08-29 DIAGNOSIS — F17210 Nicotine dependence, cigarettes, uncomplicated: Secondary | ICD-10-CM | POA: Insufficient documentation

## 2017-08-29 DIAGNOSIS — Z79899 Other long term (current) drug therapy: Secondary | ICD-10-CM | POA: Insufficient documentation

## 2017-08-29 DIAGNOSIS — F1124 Opioid dependence with opioid-induced mood disorder: Secondary | ICD-10-CM | POA: Diagnosis not present

## 2017-08-29 DIAGNOSIS — F329 Major depressive disorder, single episode, unspecified: Secondary | ICD-10-CM | POA: Diagnosis present

## 2017-08-29 NOTE — ED Triage Notes (Signed)
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 Pt denies any SI/HI.

## 2017-08-30 ENCOUNTER — Emergency Department (HOSPITAL_COMMUNITY)
Admission: EM | Admit: 2017-08-30 | Discharge: 2017-08-30 | Disposition: A | Discharge status: Home / Self Care | Payer: Medicaid Other | Attending: Emergency Medicine | Admitting: Emergency Medicine

## 2017-08-30 DIAGNOSIS — F1124 Opioid dependence with opioid-induced mood disorder: Secondary | ICD-10-CM

## 2017-08-30 NOTE — ED Notes (Signed)
Bed: WTR8 Expected date:  Expected time:  Means of arrival:  Comments: 

## 2017-08-30 NOTE — Discharge Instructions (Signed)
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 See community resources provided above. If you develop thoughts of wanting to hurt herself or anyone else you should be reevaluated immediately.

## 2017-08-30 NOTE — ED Notes (Signed)
Bed: WTR6 Expected date:  Expected time:  Means of arrival:  Comments: 

## 2017-08-30 NOTE — ED Notes (Signed)
Pt states since we are not helping her she doesn't see the need for recheck v/s

## 2017-08-30 NOTE — ED Provider Notes (Signed)
[facility]-EMERGENCY DEPT Provider Note   CSN: [id number] Arrival date & time: 08/29/17  1949     History   Chief Complaint Chief Complaint  Patient presents with  . Addiction Problem    HPI [name] is a 26 y.o. female.  HPI  This is a 26 year old female who presents requesting detox from heroin. Patient reports that she uses heroin daily. During her most recent pregnancy she was on maintenance medication but relapsed after her daughter was born. She uses frequently with her husband. She does report that she used cocaine yesterday but she states that she does not normally use cocaine. She reports that she feels depressed regarding her heroin abuse and is sad given it affects her life with her children. She denies suicidal or homicidal ideation.  Past Medical History:  Diagnosis Date  . History of RPR test 07/21/2016   02/2016: 1:1 with negative TPA testing [ ]  f/u 28wks [ ]  f/u admit RPR [ ]  f/u PP RPR    Patient Active Problem List   Diagnosis Date Noted  . Normal labor 10/16/2016  . Cigarette smoker 08/18/2016  . Pregnancy complicated by subutex maintenance, antepartum (HCC) 08/04/2016  . Hepatitis C, chronic, maternal, antepartum (HCC) 08/04/2016  . Supervision of high-risk pregnancy 07/21/2016  . History of substance abuse 07/21/2016  . ASCUS with positive high risk HPV 07/21/2016    Past Surgical History:  Procedure Laterality Date  . TONSILLECTOMY      OB History    Gravida Para Term Preterm AB Living   2 2 2     2    SAB TAB Ectopic Multiple Live Births         0 2       Home Medications    Prior to Admission medications   Medication Sig Start Date End Date Taking? Authorizing Provider  buprenorphine (SUBUTEX) 8 MG SUBL SL tablet Place 1 tablet (8 mg total) under the tongue daily before breakfast. 10/19/16   Youlanda MightySantos, Josue D, MD  Docosahexaenoic Acid (DHA PO) Take 1 tablet by mouth daily.    [provider]  ferrous sulfate 325 (65 FE) MG tablet  Take 325 mg by mouth daily. 06/02/16   [provider]  ibuprofen (ADVIL,MOTRIN) 600 MG tablet Take 1 tablet (600 mg total) by mouth every 6 (six) hours. 10/18/16   [place] MoritaLawson, Marie D, CNM  Prenatal Vit-Fe Fumarate-FA (PRENATAL MULTIVITAMIN) TABS tablet Take 1 tablet by mouth daily at 12 noon.    [provider]    Family History Family History  Problem Relation Age of Onset  . Vision loss Mother   . Depression Mother   . Ulcers Mother   . Gallbladder disease Mother   . Mental illness Mother   . Thyroid disease Mother   . Allergies Father   . Vision loss Father   . Vision loss Maternal Grandmother   . Ulcers Maternal Grandmother   . Gallbladder disease Maternal Grandmother   . Diabetes Maternal Grandmother   . Vision loss Maternal Grandfather   . Heart disease Maternal Grandfather   . Cancer Maternal Grandfather   . Allergies Paternal Grandmother   . Vision loss Paternal Grandmother   . Gallbladder disease Paternal Grandmother   . Arthritis Paternal Grandmother   . Cancer Paternal Grandmother   . Allergies Paternal Grandfather   . Vision loss Paternal Grandfather     Social History Social History  Substance Use Topics  . Smoking status: Current Every Day Smoker  Packs/day: 0.00    Types: Cigarettes  . Smokeless tobacco: Never Used  . Alcohol use No     Allergies   Dextromethorphan   Review of Systems Review of Systems  Constitutional: Negative for fever.  HENT: Positive for dental problem.   Respiratory: Negative for shortness of breath.   Cardiovascular: Negative for chest pain.  Psychiatric/Behavioral: Positive for dysphoric mood. Negative for suicidal ideas. The patient is nervous/anxious.   All other systems reviewed and are negative.    Physical Exam Updated Vital Signs BP 109/76 (BP Location: Left Arm)   Pulse (!) 102   Temp 98.8 F (37.1 C) (Oral)   Resp 20   Ht 5' (1.524 m)   Wt 44.5 kg (98 lb)   LMP 08/16/2017   SpO2 100%    BMI 19.14 kg/m   Physical Exam  Constitutional: She is oriented to person, place, and time.  Thin, generally disheveled appearing, no acute distress  HENT:  Head: Normocephalic and atraumatic.  Cardiovascular: Normal rate, regular rhythm and normal heart sounds.   No murmur heard. Pulmonary/Chest: Effort normal and breath sounds normal. No respiratory distress. She has no wheezes.  Abdominal: Soft. There is no tenderness.  Musculoskeletal: She exhibits no edema.  Neurological: She is alert and oriented to person, place, and time.  Skin: Skin is warm and dry.  Track marks  Psychiatric:  Anxious appearing but nontoxic  Nursing note and vitals reviewed.    ED Treatments / Results  Labs (all labs ordered are listed, but only abnormal results are displayed) Labs Reviewed - No data to display  EKG  EKG Interpretation None       Radiology No results found.  Procedures Procedures (including critical care time)  Medications Ordered in ED Medications - No data to display   Initial Impression / Assessment and Plan / ED Course  I have reviewed the triage vital signs and the nursing notes.  Pertinent labs & imaging results that were available during my care of the patient were reviewed by me and considered in my medical decision making (see chart for details).     Patient presents requesting heroin detox. She is nontoxic. Mildly tachycardic. She has no thoughts of wanting to hurt herself or anyone else. She does report some depressive symptoms. At this time would not meet inpatient criteria for detox. I discussed with her at length seeking outpatient resources and will provide these for her. I also encouraged her to follow-up with her primary physician regarding being started on a mood stabilizer given endorse depressive symptoms.  After history, exam, and medical workup I feel the patient has been appropriately medically screened and is safe for discharge home. Pertinent  diagnoses were discussed with the patient. Patient was given return precautions.   Final Clinical Impressions(s) / ED Diagnoses   Final diagnoses:  Opioid dependence with opioid-induced mood disorder [facility])    New Prescriptions New Prescriptions   No medications on file     Shon Baton, MD 08/30/17 0101

## 2017-08-30 NOTE — ED Notes (Signed)
Bed: WA08 Expected date:  Expected time:  Means of arrival:  Comments: 

## 2017-09-08 ENCOUNTER — Encounter: Payer: Self-pay | Admitting: Family Medicine

## 2017-09-08 ENCOUNTER — Ambulatory Visit (INDEPENDENT_AMBULATORY_CARE_PROVIDER_SITE_OTHER): Payer: Medicaid Other | Admitting: Family Medicine

## 2017-09-08 VITALS — BP 90/50 | HR 86 | Resp 16 | Wt 92.2 lb

## 2017-09-08 DIAGNOSIS — F339 Major depressive disorder, recurrent, unspecified: Secondary | ICD-10-CM

## 2017-09-08 DIAGNOSIS — F1110 Opioid abuse, uncomplicated: Secondary | ICD-10-CM

## 2017-09-08 NOTE — Patient Instructions (Signed)
   Dr Lolly MustacheArfeen [phone]

## 2017-09-08 NOTE — Progress Notes (Signed)
   Subjective:    Patient ID: [name], female    DOB: 02-11-1991, 26 y.o.   MRN: [id number]  HPI She is here for consultation concerning anxiety, depression and opioid use. She has a previous history of difficulty with mood disorder and possible ADD. She was treated in the past by Dr. Wynonia LawmanHejazi for this and apparently did well on Lamictal Zoloft and Adderall. This was several years ago. She was seen on September 3 in the emergency room because of opioid use and her desire to be detoxed however the ER record says that she did not qualify for admission. She is here for further help for this. She states that she has been off opioids for 10 days. She is on Medicaid.  Review of Systems     Objective:   Physical Exam Alert and in no distress with appropriate affect. No obvious withdrawal symptoms were noted. PH Q9 score of 22      Assessment & Plan:  Opioid use disorder, mild, abuse  Depression, recurrent (HCC)  I explained to her that this is a mixed picture of opioid use, depression, possible mood disorder and recommended psychiatric evaluation and treatment. She was given the name of Dr. Lolly [name]. She states that she does plan to call him today to get involved in counseling and treatment.

## 2017-12-15 ENCOUNTER — Ambulatory Visit (HOSPITAL_COMMUNITY)
Admission: AD | Admit: 2017-12-15 | Discharge: 2017-12-15 | Disposition: A | Discharge status: Home / Self Care | Payer: Medicaid Other | Attending: Psychiatry | Admitting: Psychiatry

## 2017-12-15 DIAGNOSIS — F332 Major depressive disorder, recurrent severe without psychotic features: Secondary | ICD-10-CM | POA: Diagnosis not present

## 2017-12-15 DIAGNOSIS — F1120 Opioid dependence, uncomplicated: Secondary | ICD-10-CM | POA: Diagnosis not present

## 2017-12-15 DIAGNOSIS — F17210 Nicotine dependence, cigarettes, uncomplicated: Secondary | ICD-10-CM | POA: Insufficient documentation

## 2017-12-16 ENCOUNTER — Other Ambulatory Visit: Payer: Self-pay

## 2017-12-16 ENCOUNTER — Encounter (HOSPITAL_COMMUNITY): Payer: Self-pay | Admitting: *Deleted

## 2017-12-16 ENCOUNTER — Emergency Department (HOSPITAL_COMMUNITY)
Admission: EM | Admit: 2017-12-16 | Discharge: 2017-12-16 | Disposition: A | Discharge status: Other Acute Inpatient Hospital | Payer: Medicaid Other | Attending: Emergency Medicine | Admitting: Emergency Medicine

## 2017-12-16 ENCOUNTER — Inpatient Hospital Stay (HOSPITAL_COMMUNITY)
Admission: AD | Admit: 2017-12-16 | Discharge: 2017-12-20 | DRG: 885 | Disposition: A | Discharge status: Home / Self Care | Payer: Medicaid Other | Source: Intra-hospital | Attending: Psychiatry | Admitting: Psychiatry

## 2017-12-16 ENCOUNTER — Encounter (HOSPITAL_COMMUNITY): Payer: Self-pay

## 2017-12-16 DIAGNOSIS — F419 Anxiety disorder, unspecified: Secondary | ICD-10-CM

## 2017-12-16 DIAGNOSIS — Z62810 Personal history of physical and sexual abuse in childhood: Secondary | ICD-10-CM | POA: Diagnosis not present

## 2017-12-16 DIAGNOSIS — F332 Major depressive disorder, recurrent severe without psychotic features: Secondary | ICD-10-CM

## 2017-12-16 DIAGNOSIS — Z818 Family history of other mental and behavioral disorders: Secondary | ICD-10-CM | POA: Diagnosis not present

## 2017-12-16 DIAGNOSIS — F1410 Cocaine abuse, uncomplicated: Secondary | ICD-10-CM

## 2017-12-16 DIAGNOSIS — R45851 Suicidal ideations: Secondary | ICD-10-CM | POA: Diagnosis present

## 2017-12-16 DIAGNOSIS — F1924 Other psychoactive substance dependence with psychoactive substance-induced mood disorder: Secondary | ICD-10-CM | POA: Diagnosis present

## 2017-12-16 DIAGNOSIS — Z888 Allergy status to other drugs, medicaments and biological substances status: Secondary | ICD-10-CM

## 2017-12-16 DIAGNOSIS — F17210 Nicotine dependence, cigarettes, uncomplicated: Secondary | ICD-10-CM | POA: Diagnosis present

## 2017-12-16 DIAGNOSIS — F909 Attention-deficit hyperactivity disorder, unspecified type: Secondary | ICD-10-CM | POA: Diagnosis present

## 2017-12-16 DIAGNOSIS — Z813 Family history of other psychoactive substance abuse and dependence: Secondary | ICD-10-CM | POA: Diagnosis not present

## 2017-12-16 DIAGNOSIS — F1920 Other psychoactive substance dependence, uncomplicated: Secondary | ICD-10-CM

## 2017-12-16 DIAGNOSIS — G4700 Insomnia, unspecified: Secondary | ICD-10-CM | POA: Diagnosis present

## 2017-12-16 DIAGNOSIS — F1123 Opioid dependence with withdrawal: Secondary | ICD-10-CM | POA: Diagnosis not present

## 2017-12-16 DIAGNOSIS — Z79899 Other long term (current) drug therapy: Secondary | ICD-10-CM | POA: Insufficient documentation

## 2017-12-16 DIAGNOSIS — F10239 Alcohol dependence with withdrawal, unspecified: Secondary | ICD-10-CM | POA: Diagnosis not present

## 2017-12-16 DIAGNOSIS — Z915 Personal history of self-harm: Secondary | ICD-10-CM | POA: Diagnosis not present

## 2017-12-16 DIAGNOSIS — B182 Chronic viral hepatitis C: Secondary | ICD-10-CM | POA: Diagnosis present

## 2017-12-16 DIAGNOSIS — F1910 Other psychoactive substance abuse, uncomplicated: Secondary | ICD-10-CM | POA: Insufficient documentation

## 2017-12-16 DIAGNOSIS — Z716 Tobacco abuse counseling: Secondary | ICD-10-CM

## 2017-12-16 DIAGNOSIS — Z046 Encounter for general psychiatric examination, requested by authority: Secondary | ICD-10-CM | POA: Insufficient documentation

## 2017-12-16 DIAGNOSIS — R450 Nervousness: Secondary | ICD-10-CM

## 2017-12-16 DIAGNOSIS — F1210 Cannabis abuse, uncomplicated: Secondary | ICD-10-CM

## 2017-12-16 DIAGNOSIS — F410 Panic disorder [episodic paroxysmal anxiety] without agoraphobia: Secondary | ICD-10-CM | POA: Diagnosis not present

## 2017-12-16 HISTORY — DX: Reserved for concepts with insufficient information to code with codable children: IMO0002

## 2017-12-16 HISTORY — DX: Personal history of self-harm: Z91.5

## 2017-12-16 HISTORY — DX: Other psychoactive substance abuse, uncomplicated: F19.10

## 2017-12-16 LAB — COMPREHENSIVE METABOLIC PANEL
ALBUMIN: 4.1 g/dL (ref 3.5–5.0)
ALT: 68 U/L — ABNORMAL HIGH (ref 14–54)
ANION GAP: 5 (ref 5–15)
AST: 37 U/L (ref 15–41)
Alkaline Phosphatase: 51 U/L (ref 38–126)
BUN: 20 mg/dL (ref 6–20)
CO2: 23 mmol/L (ref 22–32)
Calcium: 9 mg/dL (ref 8.9–10.3)
Chloride: 108 mmol/L (ref 101–111)
Creatinine, Ser: 0.99 mg/dL (ref 0.44–1.00)
GFR calc Af Amer: >60 mL/min (ref >60)
GFR calc non Af Amer: >60 mL/min (ref >60)
GLUCOSE: 114 mg/dL — AB (ref 65–99)
POTASSIUM: 3.9 mmol/L (ref 3.5–5.1)
SODIUM: 136 mmol/L (ref 135–145)
Total Bilirubin: 0.5 mg/dL (ref 0.3–1.2)
Total Protein: 7.3 g/dL (ref 6.5–8.1)

## 2017-12-16 LAB — CBC
HEMATOCRIT: 37.7 % (ref 36.0–46.0)
HEMOGLOBIN: 12.5 g/dL (ref 12.0–15.0)
MCH: 30.6 pg (ref 26.0–34.0)
MCHC: 33.2 g/dL (ref 30.0–36.0)
MCV: 92.4 fL (ref 78.0–100.0)
Platelets: 309 10*3/uL (ref 150–400)
RBC: 4.08 MIL/uL (ref 3.87–5.11)
RDW: 13.6 % (ref 11.5–15.5)
WBC: 9.2 10*3/uL (ref 4.0–10.5)

## 2017-12-16 LAB — ETHANOL: Alcohol, Ethyl (B): <10 mg/dL (ref <10)

## 2017-12-16 LAB — RAPID URINE DRUG SCREEN, HOSP PERFORMED
Amphetamines: POSITIVE — AB
BARBITURATES: NOT DETECTED
BENZODIAZEPINES: NOT DETECTED
COCAINE: POSITIVE — AB
Opiates: POSITIVE — AB
TETRAHYDROCANNABINOL: POSITIVE — AB

## 2017-12-16 LAB — I-STAT BETA HCG BLOOD, ED (MC, WL, AP ONLY): I-stat hCG, quantitative: <5 m[IU]/mL (ref <5)

## 2017-12-16 LAB — ACETAMINOPHEN LEVEL

## 2017-12-16 LAB — SALICYLATE LEVEL: Salicylate Lvl: <7 mg/dL (ref 2.8–30.0)

## 2017-12-16 MED ORDER — METHOCARBAMOL 500 MG PO TABS
500.0000 mg | ORAL_TABLET | Freq: Three times a day (TID) | ORAL | Status: DC | PRN
Start: 2017-12-16 — End: 2017-12-16
  Administered 2017-12-16: 500 mg via ORAL
  Filled 2017-12-16: qty 1

## 2017-12-16 MED ORDER — ENSURE ENLIVE PO LIQD
237.0000 mL | Freq: Two times a day (BID) | ORAL | Status: DC
  Administered 2017-12-16 – 2017-12-17 (×2): 237 mL via ORAL

## 2017-12-16 MED ORDER — NICOTINE 21 MG/24HR TD PT24
21.0000 mg | MEDICATED_PATCH | Freq: Every day | TRANSDERMAL | Status: DC
  Administered 2017-12-16: 21 mg via TRANSDERMAL
  Filled 2017-12-16: qty 1

## 2017-12-16 MED ORDER — ONDANSETRON 4 MG PO TBDP
4.0000 mg | ORAL_TABLET | Freq: Four times a day (QID) | ORAL | Status: DC | PRN
  Administered 2017-12-16: 4 mg via ORAL
  Filled 2017-12-16: qty 1

## 2017-12-16 MED ORDER — HYDROXYZINE HCL 25 MG PO TABS
25.0000 mg | ORAL_TABLET | Freq: Four times a day (QID) | ORAL | Status: DC | PRN
Start: 2017-12-16 — End: 2017-12-20
  Administered 2017-12-16 – 2017-12-19 (×5): 25 mg via ORAL
  Filled 2017-12-16 (×5): qty 1

## 2017-12-16 MED ORDER — GABAPENTIN 300 MG PO CAPS
300.0000 mg | ORAL_CAPSULE | Freq: Three times a day (TID) | ORAL | Status: DC
  Administered 2017-12-16 – 2017-12-17 (×3): 300 mg via ORAL
  Filled 2017-12-16 (×6): qty 1

## 2017-12-16 MED ORDER — NAPROXEN 500 MG PO TABS
500.0000 mg | ORAL_TABLET | Freq: Two times a day (BID) | ORAL | Status: DC | PRN
Start: 2017-12-16 — End: 2017-12-20
  Administered 2017-12-16 – 2017-12-18 (×2): 500 mg via ORAL
  Filled 2017-12-16 (×2): qty 1

## 2017-12-16 MED ORDER — CLONIDINE HCL 0.1 MG PO TABS: 0.1000 mg | ORAL_TABLET | Freq: Four times a day (QID) | ORAL | Status: DC

## 2017-12-16 MED ORDER — ACETAMINOPHEN 325 MG PO TABS: 650.0000 mg | ORAL_TABLET | Freq: Four times a day (QID) | ORAL | Status: DC | PRN

## 2017-12-16 MED ORDER — ALUM & MAG HYDROXIDE-SIMETH 200-200-20 MG/5ML PO SUSP: 30.0000 mL | ORAL | Status: DC | PRN

## 2017-12-16 MED ORDER — CLONIDINE HCL 0.1 MG PO TABS
0.1000 mg | ORAL_TABLET | Freq: Four times a day (QID) | ORAL | Status: AC
  Administered 2017-12-16 – 2017-12-17 (×3): 0.1 mg via ORAL
  Filled 2017-12-16 (×8): qty 1

## 2017-12-16 MED ORDER — CLONIDINE HCL 0.1 MG PO TABS: 0.1000 mg | ORAL_TABLET | Freq: Every day | ORAL | Status: DC

## 2017-12-16 MED ORDER — LOPERAMIDE HCL 2 MG PO CAPS: 2.0000 mg | ORAL_CAPSULE | ORAL | Status: DC | PRN

## 2017-12-16 MED ORDER — DICYCLOMINE HCL 20 MG PO TABS
20.0000 mg | ORAL_TABLET | Freq: Four times a day (QID) | ORAL | Status: DC | PRN
  Administered 2017-12-16: 20 mg via ORAL
  Filled 2017-12-16: qty 1

## 2017-12-16 MED ORDER — DICYCLOMINE HCL 20 MG PO TABS: 20.0000 mg | ORAL_TABLET | Freq: Four times a day (QID) | ORAL | Status: DC | PRN

## 2017-12-16 MED ORDER — GABAPENTIN 300 MG PO CAPS
300.0000 mg | ORAL_CAPSULE | Freq: Three times a day (TID) | ORAL | Status: DC
  Administered 2017-12-16: 300 mg via ORAL
  Filled 2017-12-16: qty 1

## 2017-12-16 MED ORDER — MAGNESIUM HYDROXIDE 400 MG/5ML PO SUSP: 30.0000 mL | Freq: Every day | ORAL | Status: DC | PRN

## 2017-12-16 MED ORDER — CLONIDINE HCL 0.1 MG PO TABS: 0.1000 mg | ORAL_TABLET | Freq: Two times a day (BID) | ORAL | Status: DC

## 2017-12-16 MED ORDER — METHOCARBAMOL 500 MG PO TABS
500.0000 mg | ORAL_TABLET | Freq: Three times a day (TID) | ORAL | Status: DC | PRN
  Administered 2017-12-16 – 2017-12-19 (×4): 500 mg via ORAL
  Filled 2017-12-16 (×4): qty 1

## 2017-12-16 MED ORDER — NAPROXEN 500 MG PO TABS: 500.0000 mg | ORAL_TABLET | Freq: Two times a day (BID) | ORAL | Status: DC | PRN

## 2017-12-16 MED ORDER — CLONIDINE HCL 0.1 MG PO TABS
ORAL_TABLET | ORAL | Status: AC
  Filled 2017-12-16: qty 1

## 2017-12-16 MED ORDER — HYDROXYZINE HCL 25 MG PO TABS
25.0000 mg | ORAL_TABLET | Freq: Four times a day (QID) | ORAL | Status: DC | PRN
  Administered 2017-12-16: 25 mg via ORAL
  Filled 2017-12-16: qty 1

## 2017-12-16 MED ORDER — NICOTINE 21 MG/24HR TD PT24
21.0000 mg | MEDICATED_PATCH | Freq: Every day | TRANSDERMAL | Status: DC
  Administered 2017-12-17 – 2017-12-19 (×3): 21 mg via TRANSDERMAL
  Filled 2017-12-16 (×6): qty 1

## 2017-12-16 MED ORDER — CLONIDINE HCL 0.1 MG PO TABS
0.1000 mg | ORAL_TABLET | Freq: Two times a day (BID) | ORAL | Status: DC
  Administered 2017-12-18 – 2017-12-19 (×2): 0.1 mg via ORAL
  Filled 2017-12-16 (×5): qty 1

## 2017-12-16 MED ORDER — CLONIDINE HCL 0.1 MG PO TABS
0.1000 mg | ORAL_TABLET | Freq: Every day | ORAL | Status: DC
  Filled 2017-12-16 (×2): qty 1

## 2017-12-16 MED ORDER — ONDANSETRON 4 MG PO TBDP: 4.0000 mg | ORAL_TABLET | Freq: Four times a day (QID) | ORAL | Status: DC | PRN

## 2017-12-16 NOTE — Consult Note (Signed)
[facility] Consult   Reason for Consult:  Suicide threat Referring Physician:  EDP Patient Identification: [name] MRN:  [id number] Principal Diagnosis: Major depressive disorder, recurrent severe without psychotic features [facility]) Diagnosis:   Patient Active Problem List   Diagnosis Date Noted  . Polysubstance dependence including opioid type drug without complication, episodic abuse ([place]) [F19.20] 12/16/2017    Priority: High  . Major depressive disorder, recurrent severe without psychotic features ([place]) [F33.2] 12/16/2017    Priority: High  . Normal labor [O80, Z37.9] 10/16/2016  . Cigarette smoker [F17.210] 08/18/2016  . Pregnancy complicated by subutex maintenance, antepartum ([place]) [O99.320, F11.20] 08/04/2016  . Hepatitis C, chronic, maternal, antepartum ([place]) [O98.419, B18.2] 08/04/2016  . Supervision of high-risk pregnancy [O09.90] 07/21/2016  . History of substance abuse [Z87.898] 07/21/2016  . ASCUS with positive high risk HPV [IMO0002] 07/21/2016    Total Time spent with patient: 45 minutes  Subjective:   [name] is a 26 y.o. female patient admitted with suicide plan.  HPI:  26 yo female who presented to the ED with suicidal ideations and a plan to jump off the parking deck like her brother did 6 years ago.  Does admit to self mutilation, multiple scabs on her face, underweight severely.  She has been using a multitude of drugs (positive for amphetamines-uses meth, cocaine, opiates-uses heroin, and THC) and not eating properly, no history of an eating disorder.  No homicidal ideations or hallucinations.   Agreeable to get assistance for her substance abuse and depression  Past Psychiatric History: substance abuse, depression  Risk to Self: Is patient at risk for suicide?: Yes Risk to Others:  no Prior Inpatient Therapy:  yes Prior Outpatient Therapy:  yes  Past Medical History:  Past Medical History:  Diagnosis Date  . History of RPR  test 07/21/2016   02/2016: 1:1 with negative TPA testing '[ ]'  f/u 28wks '[ ]'  f/u admit RPR '[ ]'  f/u PP RPR  . History of self-harm   . Polysubstance abuse [facility])     Past Surgical History:  Procedure Laterality Date  . TONSILLECTOMY     Family History:  Family History  Problem Relation Age of Onset  . Vision loss Mother   . Depression Mother   . Ulcers Mother   . Gallbladder disease Mother   . Mental illness Mother   . Thyroid disease Mother   . Allergies Father   . Vision loss Father   . Vision loss Maternal Grandmother   . Ulcers Maternal Grandmother   . Gallbladder disease Maternal Grandmother   . Diabetes Maternal Grandmother   . Vision loss Maternal Grandfather   . Heart disease Maternal Grandfather   . Cancer Maternal Grandfather   . Allergies Paternal Grandmother   . Vision loss Paternal Grandmother   . Gallbladder disease Paternal Grandmother   . Arthritis Paternal Grandmother   . Cancer Paternal Grandmother   . Allergies Paternal Grandfather   . Vision loss Paternal Grandfather    Family Psychiatric  History: substance abuse Social History:  Social History   Substance and Sexual Activity  Alcohol Use No     Social History   Substance and Sexual Activity  Drug Use Yes  . Frequency: 5.0 times per week  . Types: Cocaine, Marijuana, Methamphetamines   Comment: Heroin    Social History   Socioeconomic History  . Marital status: Single    Spouse name: None  . Number of children: None  . Years of education:  None  . Highest education level: None  Social Needs  . Financial resource strain: None  . Food insecurity - worry: None  . Food insecurity - inability: None  . Transportation needs - medical: None  . Transportation needs - non-medical: None  Occupational History  . None  Tobacco Use  . Smoking status: Current Every Day Smoker    Packs/day: 0.00    Types: Cigarettes  . Smokeless tobacco: Never Used  Substance and Sexual Activity  . Alcohol use: No   . Drug use: Yes    Frequency: 5.0 times per week    Types: Cocaine, Marijuana, Methamphetamines    Comment: Heroin  . Sexual activity: Yes    Partners: Male  Other Topics Concern  . None  Social History Narrative  . None   Additional Social History: N/A    Allergies:   Allergies  Allergen Reactions  . Dextromethorphan Swelling    Labs:  Results for orders placed or performed during the hospital encounter of 12/16/17 (from the past 48 hour(s))  Comprehensive metabolic panel     Status: Abnormal   Collection Time: 12/16/17  1:05 AM  Result Value Ref Range   Sodium 136 135 - 145 mmol/L   Potassium 3.9 3.5 - 5.1 mmol/L   Chloride 108 101 - 111 mmol/L   CO2 23 22 - 32 mmol/L   Glucose, Bld 114 (H) 65 - 99 mg/dL   BUN 20 6 - 20 mg/dL   Creatinine, Ser 0.99 0.44 - 1.00 mg/dL   Calcium 9.0 8.9 - 10.3 mg/dL   Total Protein 7.3 6.5 - 8.1 g/dL   Albumin 4.1 3.5 - 5.0 g/dL   AST 37 15 - 41 U/L   ALT 68 (H) 14 - 54 U/L   Alkaline Phosphatase 51 38 - 126 U/L   Total Bilirubin 0.5 0.3 - 1.2 mg/dL   GFR calc non Af Amer >60 >60 mL/min   GFR calc Af Amer >60 >60 mL/min    Comment: (NOTE) The eGFR has been calculated using the CKD EPI equation. This calculation has not been validated in all clinical situations. eGFR's persistently <60 mL/min signify possible Chronic Kidney Disease.    Anion gap 5 5 - 15  Ethanol     Status: None   Collection Time: 12/16/17  1:05 AM  Result Value Ref Range   Alcohol, Ethyl (B) <10 <10 mg/dL    Comment:        LOWEST DETECTABLE LIMIT FOR SERUM ALCOHOL IS 10 mg/dL FOR MEDICAL PURPOSES ONLY   Salicylate level     Status: None   Collection Time: 12/16/17  1:05 AM  Result Value Ref Range   Salicylate Lvl <1.6 2.8 - 30.0 mg/dL  Acetaminophen level     Status: Abnormal   Collection Time: 12/16/17  1:05 AM  Result Value Ref Range   Acetaminophen (Tylenol), Serum <10 (L) 10 - 30 ug/mL    Comment:        THERAPEUTIC CONCENTRATIONS  VARY SIGNIFICANTLY. A RANGE OF 10-30 ug/mL MAY BE AN EFFECTIVE CONCENTRATION FOR MANY PATIENTS. HOWEVER, SOME ARE BEST TREATED AT CONCENTRATIONS OUTSIDE THIS RANGE. ACETAMINOPHEN CONCENTRATIONS >150 ug/mL AT 4 HOURS AFTER INGESTION AND >50 ug/mL AT 12 HOURS AFTER INGESTION ARE OFTEN ASSOCIATED WITH TOXIC REACTIONS.   cbc     Status: None   Collection Time: 12/16/17  1:05 AM  Result Value Ref Range   WBC 9.2 4.0 - 10.5 K/uL   RBC 4.08 3.87 - 5.11  MIL/uL   Hemoglobin 12.5 12.0 - 15.0 g/dL   HCT 37.7 36.0 - 46.0 %   MCV 92.4 78.0 - 100.0 fL   MCH 30.6 26.0 - 34.0 pg   MCHC 33.2 30.0 - 36.0 g/dL   RDW 13.6 11.5 - 15.5 %   Platelets 309 150 - 400 K/uL  Rapid urine drug screen (hospital performed)     Status: Abnormal   Collection Time: 12/16/17  1:16 AM  Result Value Ref Range   Opiates POSITIVE (A) NONE DETECTED   Cocaine POSITIVE (A) NONE DETECTED   Benzodiazepines NONE DETECTED NONE DETECTED   Amphetamines POSITIVE (A) NONE DETECTED   Tetrahydrocannabinol POSITIVE (A) NONE DETECTED   Barbiturates NONE DETECTED NONE DETECTED    Comment: (NOTE) DRUG SCREEN FOR MEDICAL PURPOSES ONLY.  IF CONFIRMATION IS NEEDED FOR ANY PURPOSE, NOTIFY LAB WITHIN 5 DAYS. LOWEST DETECTABLE LIMITS FOR URINE DRUG SCREEN Drug Class                     Cutoff (ng/mL) Amphetamine and metabolites    1000 Barbiturate and metabolites    200 Benzodiazepine                 161 Tricyclics and metabolites     300 Opiates and metabolites        300 Cocaine and metabolites        300 THC                            50   I-Stat beta hCG blood, ED     Status: None   Collection Time: 12/16/17  1:20 AM  Result Value Ref Range   I-stat hCG, quantitative <5.0 <5 mIU/mL   Comment 3            Comment:   GEST. AGE      CONC.  (mIU/mL)   <=1 WEEK        5 - 50     2 WEEKS       50 - 500     3 WEEKS       100 - 10,000     4 WEEKS     1,000 - 30,000        FEMALE AND NON-PREGNANT FEMALE:     LESS THAN 5  mIU/mL     Current Facility-Administered Medications  Medication Dose Route Frequency Provider Last Rate Last Dose  . cloNIDine (CATAPRES) tablet 0.1 mg  0.1 mg Oral QID Molpus, John, MD       Followed by  . [START ON 12/18/2017] cloNIDine (CATAPRES) tablet 0.1 mg  0.1 mg Oral BID Molpus, John, MD       Followed by  . [START ON 12/20/2017] cloNIDine (CATAPRES) tablet 0.1 mg  0.1 mg Oral Daily Molpus, John, MD      . dicyclomine (BENTYL) tablet 20 mg  20 mg Oral Q6H PRN Molpus, John, MD   20 mg at 12/16/17 0857  . gabapentin (NEURONTIN) capsule 300 mg  300 mg Oral TID Patrecia Pour, NP      . hydrOXYzine (ATARAX/VISTARIL) tablet 25 mg  25 mg Oral Q6H PRN Molpus, John, MD   25 mg at 12/16/17 0857  . loperamide (IMODIUM) capsule 2-4 mg  2-4 mg Oral PRN Molpus, John, MD      . methocarbamol (ROBAXIN) tablet 500 mg  500 mg Oral Q8H PRN Molpus, John, MD  500 mg at 12/16/17 0857  . naproxen (NAPROSYN) tablet 500 mg  500 mg Oral BID PRN Molpus, John, MD      . nicotine (NICODERM CQ - dosed in mg/24 hours) patch 21 mg  21 mg Transdermal Daily Molpus, John, MD      . ondansetron (ZOFRAN-ODT) disintegrating tablet 4 mg  4 mg Oral Q6H PRN Molpus, John, MD   4 mg at 12/16/17 4315   Current Outpatient Medications  Medication Sig Dispense Refill  . acetaminophen (TYLENOL) 325 MG tablet Take 650 mg by mouth every 6 (six) hours as needed for moderate pain or headache.    Marland Kitchen acetaminophen (TYLENOL) 500 MG tablet Take 500 mg by mouth every 6 (six) hours as needed.    . buprenorphine (SUBUTEX) 8 MG SUBL SL tablet Place 1 tablet (8 mg total) under the tongue daily before breakfast. (Patient not taking: Reported on 09/08/2017) 60 each 0  . ibuprofen (ADVIL,MOTRIN) 600 MG tablet Take 1 tablet (600 mg total) by mouth every 6 (six) hours. (Patient not taking: Reported on 09/08/2017) 30 tablet 0    Musculoskeletal: Strength & Muscle Tone: within normal limits Gait & Station: normal Patient leans:  N/A  Psychiatric Specialty Exam: Physical Exam  Nursing note and vitals reviewed. Constitutional: She is oriented to person, place, and time. She appears well-developed.  HENT:  Head: Normocephalic.  Neck: Normal range of motion.  Respiratory: Effort normal.  Musculoskeletal: Normal range of motion.  Neurological: She is alert and oriented to person, place, and time.  Psychiatric: Her speech is normal and behavior is normal. Judgment normal. Cognition and memory are normal. She exhibits a depressed mood. She expresses suicidal ideation. She expresses suicidal plans.    Review of Systems  Psychiatric/Behavioral: Positive for depression, substance abuse and suicidal ideas. The patient is nervous/anxious.   All other systems reviewed and are negative.   Blood pressure 110/64, pulse 83, temperature 98.3 F (36.8 C), temperature source Oral, resp. rate 16, SpO2 96 %, unknown if currently breastfeeding.There is no height or weight on file to calculate BMI.  General Appearance: Disheveled  Eye Contact:  Minimal  Speech:  Normal Rate  Volume:  Decreased  Mood:  Anxious and Depressed  Affect:  Congruent  Thought Process:  Coherent and Descriptions of Associations: Intact  Orientation:  Full (Time, Place, and Person)  Thought Content:  Rumination  Suicidal Thoughts:  Yes.  with intent/plan  Homicidal Thoughts:  No  Memory:  Immediate;   Fair Recent;   Fair Remote;   Fair  Judgement:  Impaired  Insight:  Fair  Psychomotor Activity:  Decreased  Concentration:  Concentration: Fair and Attention Span: Fair  Recall:  [employer] of Knowledge:  Fair  Language:  Good  Akathisia:  No  Handed:  Right  AIMS (if indicated):   N/A  Assets:  Leisure Time Physical Health Resilience  ADL's:  Intact  Cognition:  WNL  Sleep:   N/A     Treatment Plan Summary: Daily contact with patient to assess and evaluate symptoms and progress in treatment, Medication management and Plan major depressive  disorder, recurrent, severe without psychossi:  -Crisis stabilization -Medication management:  Clonidine opiate withdrawal protocol started along with gabapentin 300 mg TID for withdrawal symptoms -Individual and substance abuse counseling -Peer support referral  Disposition: Recommend psychiatric Inpatient admission when medically cleared.  Waylan Boga, NP 12/16/2017 10:00 AM   Patient seen face-to-face for psychiatric evaluation, chart reviewed and case discussed with the physician  extender and developed treatment plan. Reviewed the information documented and agree with the treatment plan.  Buford Dresser, DO

## 2017-12-16 NOTE — ED Notes (Signed)
Report called to Behavioral Health, Quintella ReichertBeverly Knight RN

## 2017-12-16 NOTE — ED Notes (Signed)
No respiratory or acute distress noted resting in bed with eyes closed call light in reach hospital sitter at bedside. 

## 2017-12-16 NOTE — ED Notes (Signed)
Bed: [id number] Expected date:  Expected time:  Means of arrival:  Comments: [place]

## 2017-12-16 NOTE — ED Notes (Signed)
[facility]

## 2017-12-16 NOTE — Progress Notes (Signed)
[name] is a 26 year old female pt admitted on voluntary basis. On admission, [name] endorses depression, recent suicidal thoughts and substance abuse. She reports that she abuses heroin and more recently crack cocaine. She reports that she is not currently on any medications and reports she has not been on any since she was a teenager. She denies any current SI and is able to contract for safety while in the hospital. She reports that she lives with her husband and 2 daughters and reports that she will go back there after discharge. [name] was oriented to the unit and safety maintained.

## 2017-12-16 NOTE — ED Notes (Signed)
Patient with multiple c/o withdrawal symptoms. PRN medication requested and received. Confirms fequent "thoughts" of suicide. Desires long term treatment.

## 2017-12-16 NOTE — H&P (Signed)
Behavioral Health Medical Screening Exam  [name] is an 26 y.o. female. Presents with her husband and sister. [name] has a hx of polysubstance abuse I.e. Heroin, crack, cannabis and meth, but denies alcohol consumption. She is endorsing depression along with passive SI, but no intent, plan or means. There are no known co-morbid conditions  Total Time spent with patient: 20 minutes  Psychiatric Specialty Exam: Physical Exam  Constitutional: She is oriented to person, place, and time. No distress.  HENT:  Head: Normocephalic.  Eyes: Pupils are equal, round, and reactive to light.  Respiratory: Effort normal and breath sounds normal. No respiratory distress.  Neurological: She is alert and oriented to person, place, and time. No cranial nerve deficit.  Skin: Skin is warm and dry. She is not diaphoretic.  Psychiatric: Her speech is normal. Judgment normal. She is withdrawn. Cognition and memory are normal. She exhibits a depressed mood. She expresses suicidal ideation.    Review of Systems  Psychiatric/Behavioral: Positive for depression, substance abuse and suicidal ideas. The patient has insomnia.   All other systems reviewed and are negative.   unknown if currently breastfeeding.There is no height or weight on file to calculate BMI.  General Appearance: Casual  Eye Contact:  Good  Speech:  Clear and Coherent  Volume:  Normal  Mood:  Depressed  Affect:  Congruent  Thought Process:  Coherent  Orientation:  Full (Time, Place, and Person)  Thought Content:  Logical  Suicidal Thoughts:  Yes.  without intent/plan  Homicidal Thoughts:  No  Memory:  Immediate;   Fair  Judgement:  Impaired  Insight:  Lacking  Psychomotor Activity:  Normal  Concentration: Concentration: Fair  Recall:  [employer]  Fund of Knowledge:Fair  Language: Fair  Akathisia:  Negative  Handed:  Right  AIMS (if indicated):     Assets:  Desire for Improvement  Sleep:       Musculoskeletal: Strength & Muscle  Tone: within normal limits Gait & Station: normal Patient leans: N/A  unknown if currently breastfeeding.  Recommendations:  Based on my evaluation the patient appears to have an emergency medical condition for which I recommend the patient be transferred to the emergency department for further evaluation.  Kerry HoughSpencer E Athol Bolds, PA-C 12/16/2017, 12:50 AM

## 2017-12-16 NOTE — ED Notes (Signed)
[facility] called for transport. 

## 2017-12-16 NOTE — ED Notes (Signed)
Pt presents with SI, increasing thoughts to jump off parking deck that brother jumped off of 6 years ago.  A&O x 3, no distress noted, calm & cooperative.  Denies HI or AVH.    Pt admits to using Heroin, Crack, THC and Methamphetamine.  Monitoring for safety, Q 15 min checks in effect.

## 2017-12-16 NOTE — ED Notes (Signed)
Nurse called a third time for report.  No answer.

## 2017-12-16 NOTE — Progress Notes (Signed)
D   Pt found in her room resting in bed    She reports some significant withdrawal symptoms  Including anxiety and body aches and pains   She has isolated to her room and said she feels bad   No interaction with others but is very appreciative of all that is done for her A    Verbal support given   Medications administered and effectiveness monitored    Q 15 min checks   Discussed withdrawal symptoms and medications to relieve them R   Pt is safe at present and verbalized understanding

## 2017-12-16 NOTE — Tx Team (Signed)
Initial Treatment Plan 12/16/2017 4:45 PM [name] [id number]    PATIENT STRESSORS: Financial difficulties Marital or family conflict Substance abuse   PATIENT STRENGTHS: Ability for insight Average or above average intelligence Capable of independent living General fund of knowledge Motivation for treatment/growth Supportive family/friends   PATIENT IDENTIFIED PROBLEMS: Depression Suicidal thoughts Substance Abuse "I need coping skills for my drug habit" "I have been depressed for months"                     DISCHARGE CRITERIA:  Ability to meet basic life and health needs Improved stabilization in mood, thinking, and/or behavior Verbal commitment to aftercare and medication compliance Withdrawal symptoms are absent or subacute and managed without 24-hour nursing intervention  PRELIMINARY DISCHARGE PLAN: Attend aftercare/continuing care group Return to previous living arrangement  PATIENT/FAMILY INVOLVEMENT: This treatment plan has been presented to and reviewed with the patient, [name], and/or family member, .  The patient and family have been given the opportunity to ask questions and make suggestions.  [name], [place]ibbard[place]ok [name], [place]RN 12/16/2017, 4:45 PM

## 2017-12-16 NOTE — ED Triage Notes (Signed)
Pt reports "I have been on a crack, heroine, and meth binge x 5 days." Pt also reports "I will kill myself I don't get help for my additions." Pt has hx of cutting. Pt reports SI w/o plan.
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
## 2017-12-16 NOTE — BH Assessment (Signed)
[facility]  Per Juanetta BeetsJacqueline Norman, DO, this pt requires psychiatric hospitalization at this time.  Berneice Heinrichina Tate, RN, [id number]; [facility] will be ready to receive pt at 13:00.  Pt has signed Voluntary Admission and Consent for Treatment, as well as Consent to Release Information to no one, and signed forms [facility].  Pt's nurse, Carlisle BeersLuann, has been notified, and agrees to send original paperwork along with pt via [facility] [place], and to call report to [phone].  Doylene Canninghomas Naveya Ellerman, MA Triage Specialist [phone]

## 2017-12-16 NOTE — ED Notes (Signed)
Attempted to call report to behavioral health hospital.  No answer, tried twice.

## 2017-12-16 NOTE — Progress Notes (Signed)
Psychoeducational Group Note  Date:  12/16/2017 Time:  2045  Group Topic/Focus:  wrap up group  Participation Level: Did Not Attend  Participation Quality:  Not Applicable  Affect:  Not Applicable  Cognitive:  Not Applicable  Insight:  Not Applicable  Engagement in Group: Not Applicable  Additional Comments:  Pt remained in bed asleep during group time.   Marcille BuffyMcNeil, Shakaya Bhullar S 12/16/2017, 9:51 PM

## 2017-12-16 NOTE — ED Provider Notes (Signed)
[facility]-EMERGENCY DEPT Provider Note: Lowella DellJ. Lane Webb Weed, MD, FACEP  CSN: [id number] MRN: [id number] ARRIVAL: 12/16/17 at 0055 ROOM: WA26/WA26   CHIEF COMPLAINT  Medical Clearance   HISTORY OF PRESENT ILLNESS  12/16/17 3:05 AM [name] is a 26 y.o. female with a history of polysubstance abuse.  She is here after a reported 5-day cocaine binge during which she is also used heroin, marijuana and methamphetamine.  She denies alcohol use.  She was seen at behavioral health yesterday with complaints of depression and suicidal ideation.  She was sent here for further evaluation.  She is stating that she needs help getting off drugs or she will kill herself.  She does not have a specific plan.  She is complaining of feeling sweaty.  She denies pain at the present time.   Past Medical History:  Diagnosis Date  . History of RPR test 07/21/2016   02/2016: 1:1 with negative TPA testing [ ]  f/u 28wks [ ]  f/u admit RPR [ ]  f/u PP RPR  . History of self-harm   . Polysubstance abuse [facility](HCC)     Past Surgical History:  Procedure Laterality Date  . TONSILLECTOMY      Family History  Problem Relation Age of Onset  . Vision loss Mother   . Depression Mother   . Ulcers Mother   . Gallbladder disease Mother   . Mental illness Mother   . Thyroid disease Mother   . Allergies Father   . Vision loss Father   . Vision loss Maternal Grandmother   . Ulcers Maternal Grandmother   . Gallbladder disease Maternal Grandmother   . Diabetes Maternal Grandmother   . Vision loss Maternal Grandfather   . Heart disease Maternal Grandfather   . Cancer Maternal Grandfather   . Allergies Paternal Grandmother   . Vision loss Paternal Grandmother   . Gallbladder disease Paternal Grandmother   . Arthritis Paternal Grandmother   . Cancer Paternal Grandmother   . Allergies Paternal Grandfather   . Vision loss Paternal Grandfather     Social History   Tobacco Use  . Smoking status: Current Every Day Smoker     Packs/day: 0.00    Types: Cigarettes  . Smokeless tobacco: Never Used  Substance Use Topics  . Alcohol use: No  . Drug use: Yes    Frequency: 5.0 times per week    Types: Cocaine, Marijuana, Methamphetamines    Comment: Heroin    Prior to Admission medications   Medication Sig Start Date End Date Taking? Authorizing Provider  buprenorphine (SUBUTEX) 8 MG SUBL SL tablet Place 1 tablet (8 mg total) under the tongue daily before breakfast. Patient not taking: Reported on 09/08/2017 10/19/16   Youlanda MightySantos, Josue D, MD  Docosahexaenoic Acid (DHA PO) Take 1 tablet by mouth daily.    [provider]  ferrous sulfate 325 (65 FE) MG tablet Take 325 mg by mouth daily. 06/02/16   [provider]  ibuprofen (ADVIL,MOTRIN) 600 MG tablet Take 1 tablet (600 mg total) by mouth every 6 (six) hours. Patient not taking: Reported on 09/08/2017 10/18/16   Montez MoritaLawson, Marie D, CNM  Prenatal Vit-Fe Fumarate-FA (PRENATAL MULTIVITAMIN) TABS tablet Take 1 tablet by mouth daily at 12 noon.    [provider]    Allergies Dextromethorphan   REVIEW OF SYSTEMS  Negative except as noted here or in the History of Present Illness.   PHYSICAL EXAMINATION  Initial Vital Signs Blood pressure 116/68, pulse 81, temperature 98.2 F (36.8  C), temperature source Oral, resp. rate 17, SpO2 99 %, unknown if currently breastfeeding.  Examination General: Well-developed, well-nourished female in no acute distress; appearance consistent with age of record HENT: normocephalic; atraumatic Eyes: Normal appearance Neck: supple Heart: regular rate and rhythm Lungs: clear to auscultation bilaterally Abdomen: soft; nondistended; nontender; no masses or hepatosplenomegaly; bowel sounds present Extremities: No deformity; full range of motion; pulses normal Neurologic: Somnolent but arousable; motor function intact in all extremities and symmetric; no facial droop Skin: Warm and dry Psychiatric: Depressed  mood; suicidal ideation   RESULTS  Summary of this visit's results, reviewed by myself:   EKG Interpretation  Date/Time:    Ventricular Rate:    PR Interval:    QRS Duration:   QT Interval:    QTC Calculation:   R Axis:     Text Interpretation:        Laboratory Studies: Results for orders placed or performed during the hospital encounter of 12/16/17 (from the past 24 hour(s))  Comprehensive metabolic panel     Status: Abnormal   Collection Time: 12/16/17  1:05 AM  Result Value Ref Range   Sodium 136 135 - 145 mmol/L   Potassium 3.9 3.5 - 5.1 mmol/L   Chloride 108 101 - 111 mmol/L   CO2 23 22 - 32 mmol/L   Glucose, Bld 114 (H) 65 - 99 mg/dL   BUN 20 6 - 20 mg/dL   Creatinine, Ser 1.610.99 0.44 - 1.00 mg/dL   Calcium 9.0 8.9 - 09.610.3 mg/dL   Total Protein 7.3 6.5 - 8.1 g/dL   Albumin 4.1 3.5 - 5.0 g/dL   AST 37 15 - 41 U/L   ALT 68 (H) 14 - 54 U/L   Alkaline Phosphatase 51 38 - 126 U/L   Total Bilirubin 0.5 0.3 - 1.2 mg/dL   GFR calc non Af Amer >60 >60 mL/min   GFR calc Af Amer >60 >60 mL/min   Anion gap 5 5 - 15  Ethanol     Status: None   Collection Time: 12/16/17  1:05 AM  Result Value Ref Range   Alcohol, Ethyl (B) <10 <10 mg/dL  Salicylate level     Status: None   Collection Time: 12/16/17  1:05 AM  Result Value Ref Range   Salicylate Lvl <7.0 2.8 - 30.0 mg/dL  Acetaminophen level     Status: Abnormal   Collection Time: 12/16/17  1:05 AM  Result Value Ref Range   Acetaminophen (Tylenol), Serum <10 (L) 10 - 30 ug/mL  cbc     Status: None   Collection Time: 12/16/17  1:05 AM  Result Value Ref Range   WBC 9.2 4.0 - 10.5 K/uL   RBC 4.08 3.87 - 5.11 MIL/uL   Hemoglobin 12.5 12.0 - 15.0 g/dL   HCT 04.537.7 40.936.0 - 81.146.0 %   MCV 92.4 78.0 - 100.0 fL   MCH 30.6 26.0 - 34.0 pg   MCHC 33.2 30.0 - 36.0 g/dL   RDW 91.413.6 78.211.5 - 95.615.5 %   Platelets 309 150 - 400 K/uL  Rapid urine drug screen (hospital performed)     Status: Abnormal   Collection Time: 12/16/17  1:16 AM    Result Value Ref Range   Opiates POSITIVE (A) NONE DETECTED   Cocaine POSITIVE (A) NONE DETECTED   Benzodiazepines NONE DETECTED NONE DETECTED   Amphetamines POSITIVE (A) NONE DETECTED   Tetrahydrocannabinol POSITIVE (A) NONE DETECTED   Barbiturates NONE DETECTED NONE DETECTED  I-Stat beta hCG  blood, ED     Status: None   Collection Time: 12/16/17  1:20 AM  Result Value Ref Range   I-stat hCG, quantitative <5.0 <5 mIU/mL   Comment 3           Imaging Studies: No results found.  ED COURSE  Nursing notes and initial vitals signs, including pulse oximetry, reviewed.  Vitals:   12/16/17 0105 12/16/17 0134  BP: 135/80 116/68  Pulse: 96 81  Resp: 16 17  Temp: 98.3 F (36.8 C) 98.2 F (36.8 C)  TempSrc: Oral Oral  SpO2: 100% 99%    PROCEDURES    ED DIAGNOSES     ICD-10-CM   1. Suicidal ideation R45.851   2. Polysubstance abuse (HCC) F19.10        Rene Sizelove, Jonny Ruiz, MD 12/16/17 [phone]

## 2017-12-17 DIAGNOSIS — F332 Major depressive disorder, recurrent severe without psychotic features: Principal | ICD-10-CM

## 2017-12-17 DIAGNOSIS — F419 Anxiety disorder, unspecified: Secondary | ICD-10-CM

## 2017-12-17 DIAGNOSIS — Z62810 Personal history of physical and sexual abuse in childhood: Secondary | ICD-10-CM

## 2017-12-17 DIAGNOSIS — F17210 Nicotine dependence, cigarettes, uncomplicated: Secondary | ICD-10-CM

## 2017-12-17 DIAGNOSIS — Z818 Family history of other mental and behavioral disorders: Secondary | ICD-10-CM

## 2017-12-17 DIAGNOSIS — G4700 Insomnia, unspecified: Secondary | ICD-10-CM

## 2017-12-17 DIAGNOSIS — F410 Panic disorder [episodic paroxysmal anxiety] without agoraphobia: Secondary | ICD-10-CM

## 2017-12-17 DIAGNOSIS — F1920 Other psychoactive substance dependence, uncomplicated: Secondary | ICD-10-CM

## 2017-12-17 DIAGNOSIS — R450 Nervousness: Secondary | ICD-10-CM

## 2017-12-17 MED ORDER — CHLORDIAZEPOXIDE HCL 25 MG PO CAPS
50.0000 mg | ORAL_CAPSULE | Freq: Once | ORAL | Status: AC
  Administered 2017-12-17: 50 mg via ORAL
  Filled 2017-12-17: qty 2

## 2017-12-17 MED ORDER — GABAPENTIN 300 MG PO CAPS
300.0000 mg | ORAL_CAPSULE | Freq: Three times a day (TID) | ORAL | Status: DC
  Administered 2017-12-17 – 2017-12-19 (×7): 300 mg via ORAL
  Filled 2017-12-17 (×20): qty 1

## 2017-12-17 MED ORDER — CLONIDINE HCL 0.1 MG PO TABS
0.0500 mg | ORAL_TABLET | Freq: Once | ORAL | Status: AC
  Administered 2017-12-17: 0.05 mg via ORAL

## 2017-12-17 MED ORDER — RISPERIDONE 1 MG PO TABS
1.0000 mg | ORAL_TABLET | Freq: Every day | ORAL | Status: DC
  Administered 2017-12-17 – 2017-12-19 (×3): 1 mg via ORAL
  Filled 2017-12-17 (×5): qty 1

## 2017-12-17 MED ORDER — CHLORDIAZEPOXIDE HCL 25 MG PO CAPS
25.0000 mg | ORAL_CAPSULE | Freq: Four times a day (QID) | ORAL | Status: DC | PRN
  Administered 2017-12-18 – 2017-12-19 (×2): 25 mg via ORAL
  Filled 2017-12-17 (×3): qty 1

## 2017-12-17 MED ORDER — MIRTAZAPINE 15 MG PO TABS
15.0000 mg | ORAL_TABLET | Freq: Every day | ORAL | Status: DC
  Administered 2017-12-17 – 2017-12-19 (×3): 15 mg via ORAL
  Filled 2017-12-17 (×5): qty 1

## 2017-12-17 MED ORDER — ENSURE ENLIVE PO LIQD
237.0000 mL | Freq: Three times a day (TID) | ORAL | Status: DC
  Administered 2017-12-17 – 2017-12-20 (×8): 237 mL via ORAL

## 2017-12-17 NOTE — Progress Notes (Signed)
Adult Psychoeducational Group Note  Date:  12/17/2017 Time:  10:32 AM  Group Topic/Focus:  Healthy Communication:   The focus of this group is to discuss communication, barriers to communication, as well as healthy ways to communicate with others.  Participation Level:  Active  Participation Quality:  Appropriate  Affect:  Appropriate  Cognitive:  Alert  Insight: Appropriate  Engagement in Group:  Improving  Modes of Intervention:  Activity  Additional Comments:  Pt did participate in group activities and discussions.  Eniola Cerullo R Shaleka Brines 12/17/2017, 10:32 AM

## 2017-12-17 NOTE — Progress Notes (Signed)
Nursing Note 12/17/2017 0700-1300  Data Reports sleeping good without PRN sleep med.  Rates depression 7/10, hopelessness 7/10, and anxiety 5/10. Affect anxious.  Denies HI, SI, AVH. "I am pretty much going to stay in bed all day, I hope I sleep and wake up tomorrow and feel better.  I last used on the 18th, I should be good by tomorrow.  The reason I came here is to get on mental medicine."  Patient went on to describe extensive mental illness history including a brother who killed himself 6 years ago by jumping off a parking deck, and multiple relatives who have been hospitalized [facility] at [facility]  Reports cold sweats and chills.  Reports long past history of impulsivity, explosive anger, depression, anxiety, and "self medicates" with drugs.   Patient very loud and insistent aboutdischarging- needed a lot of support and de-escalation (about 30 minutes) but NP met patient and ordered some new medicines.  Signed 72 hour request for discharge this AM, more at peace after seeing NP about staying up to 3 more days.  Action Spoke with patient 1:1, nurse offered support to patient throughout shift. .  Continues to be monitored on 15 minute checks for safety.  Response Remains safe on unit, de-escalated, was labile in AM but improved by lunch time.  Resting in bed.

## 2017-12-17 NOTE — [facility] Counselor (Signed)
CSW attempted x2 to meet with patient to complete PSA. Pt sleeping heavily in room. Unable to participate in assessment at this time. CSW to try again tomorrow.   Trula SladeHeather Smart, MSW, LCSW Clinical Social Worker 12/17/2017 3:38 PM

## 2017-12-17 NOTE — Progress Notes (Addendum)
Recreation Therapy Notes  Date: 12/17/17 Time: 0930 Location: 300 Hall Dayroom  Group Topic: Stress Management  Goal Area(s) Addresses:  Patient will verbalize importance of using healthy stress management.  Patient will identify positive emotions associated with healthy stress management.   Behavioral Response: Engaged  Intervention: Stress Management  Activity :  Progressive Muscle Relaxation.  LRT read a script to lead patients through the stress management technique of progressive muscle relaxation.  Patients were to follow along to engage in the activity as LRT read the script.  Education:  Stress Management, Discharge Planning.   Education Outcome: Acknowledges edcuation/In group clarification offered/Needs additional education  Clinical Observations/Feedback: Pt attended group.    Caroll RancherMarjette Allyna Pittsley, LRT/CTRS          Lillia AbedLindsay, Kord Monette A 12/17/2017 11:04 AM

## 2017-12-17 NOTE — [facility] Suicide Risk Assessment (Signed)
[facility]   Nursing information obtained from:    Demographic factors:    Current Mental Status:    Loss Factors:    Historical Factors:    Risk Reduction Factors:     Total Time spent with patient: 45 minutes Principal Problem: Substance induced mood disorder (HCC) Diagnosis:   Patient Active Problem List   Diagnosis Date Noted  . Substance induced mood disorder (HCC) [F19.94] 12/17/2017  . Polysubstance dependence including opioid type drug without complication, episodic abuse (HCC) [F19.20] 12/16/2017  . Major depressive disorder, recurrent severe without psychotic features (HCC) [F33.2] 12/16/2017  . Normal labor [O80, Z37.9] 10/16/2016  . Cigarette smoker [F17.210] 08/18/2016  . 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 Hepatitis C, chronic, maternal, antepartum (HCC) [O98.419, B18.2] 08/04/2016  . Supervision of high-risk pregnancy [O09.90] 07/21/2016  . History of substance abuse [Z87.898] 07/21/2016  . ASCUS with positive high risk HPV [IMO0002] 07/21/2016   Subjective Data:  [name] is a 26 y/o F with history of polysubstance abuse and MDD who was admitted with worsening depression, SI with plan to jump from parking structure, and worsening substance use. Pt had UDS+ for amphetamine, opiates, cocaine, and THC.   Upon evaluation, pt shares her reasons for presenting to the hospital, stating, "Three days up really messes with my brain." She shares that she has been binging on crack cocaine for a period of about 3 days and she has minimal sleep during that period. Her typical drug of choice is heroin, which she has been using regularly for the past 6 years, with last use 3 days ago. She does not recall using methamphetamine recently, but she thinks she may have been given some during her most recent binge. Pt states that she began to have significant anxiety about her use and this resulted in  depression and SI with plan to jump from a parking structure, which is how pt's brother completed suicide. Pt endorses depressive symptoms of depressed mood, anhedonia, guilty feelings, low energy, poor concentration, poor appetite, and poor sleep. She denies symptoms of mania, OCD, and PTSD.  Discussed with patient about treatment options. She has not taken medications for several years, and she is open to trial of risperdal for mood stabilization and remeron as well for insomnia, mood, and appetite. She will be placed on withdrawal protocols to monitor for signs/symptoms of withdrawal. Pt was in agreement with the above plan and she had no further questions, comments, or concerns.    Continued Clinical Symptoms:  Alcohol Use Disorder Identification Test Final Score (AUDIT): 1 The "Alcohol Use Disorders Identification Test", Guidelines for Use in Primary Care, Second Edition.  World Science writerHealth Organization [facility](WHO). Score between 0-7:  no or low risk or alcohol related problems. Score between 8-15:  moderate risk of alcohol related problems. Score between 16-19:  high risk of alcohol related problems. Score 20 or above:  warrants further diagnostic evaluation for alcohol dependence and treatment.   CLINICAL FACTORS:   Severe Anxiety and/or Agitation Depression:   Comorbid alcohol abuse/dependence Alcohol/Substance Abuse/Dependencies More than one psychiatric diagnosis Unstable or Poor Therapeutic Relationship Previous Psychiatric Diagnoses and Treatments   Musculoskeletal: Strength & Muscle Tone: within normal limits Gait & Station: normal Patient leans: N/A  Psychiatric Specialty Exam: Physical Exam  Nursing note and vitals reviewed.   Review of Systems  Constitutional: Negative for chills and fever.  Respiratory: Negative for cough and shortness of breath.   Cardiovascular: Negative  for chest pain and palpitations.  Gastrointestinal: Negative for heartburn and nausea.   Psychiatric/Behavioral: Positive for depression and substance abuse. Negative for hallucinations and suicidal ideas. The patient is nervous/anxious.     Blood pressure 104/62, pulse 95, temperature 98.5 F (36.9 C), temperature source Oral, resp. rate 16, height 5' (1.524 m), weight 38.1 kg (84 lb), unknown if currently breastfeeding.Body mass index is 16.41 kg/m.  General Appearance: Casual and Fairly Groomed  Eye Contact:  Good  Speech:  Clear and Coherent  Volume:  Decreased  Mood:  Anxious and Depressed  Affect:  Congruent and Constricted  Thought Process:  Coherent and Goal Directed  Orientation:  Full (Time, Place, and Person)  Thought Content:  Logical  Suicidal Thoughts:  No  Homicidal Thoughts:  No  Memory:  Immediate;   Good Recent;   Good Remote;   Good  Judgement:  Impaired  Insight:  Lacking  Psychomotor Activity:  Normal  Concentration:  Concentration: Fair  Recall:  FiservFair  Fund of Knowledge:  Fair  Language:  Fair  Akathisia:  No  Handed:    AIMS (if indicated):     Assets:  Manufacturing systems engineerCommunication Skills Physical Health Resilience Social Support  ADL's:  Intact  Cognition:  WNL  Sleep:  Number of Hours: 6.25    COGNITIVE FEATURES THAT CONTRIBUTE TO RISK:  None    SUICIDE RISK:   Mild:  Suicidal ideation of limited frequency, intensity, duration, and specificity.  There are no identifiable plans, no associated intent, mild dysphoria and related symptoms, good self-control (both objective and subjective assessment), few other risk factors, and identifiable protective factors, including available and accessible social support.  PLAN OF CARE:   - Admit to inpatient level of care  - MDD  - Start remeron 15mg  po qhs  - Start risperdal 1mg  qhs - Anxiety  - Increase gabapentin 300mg  TID to gabapentin 400mg  TID  - Start atarax 25mg  po q8h prn anxiety  - EtOH withdrawal  - Start CIWA with librium  - Opiate withdrawal  - Start opiate withdrawal protocol with  clonidine  - Encourage participation in groups and therapeutic milieu -Discharge planning will be ongoing   I certify that inpatient services furnished can reasonably be expected to improve the patient's condition.   Micheal Likenshristopher T Faelynn Wynder, MD 12/17/2017, 3:59 PM

## 2017-12-17 NOTE — Progress Notes (Signed)
D.  Pt pleasant but anxious on approach, complaint of withdrawal.  Pt did attend evening AA group, and has been observed in dayroom interacting appropriately with peers.  Pt denies SI/HI/AVH at this time.  A.  Support and encouragement offered, medication given as ordered  R.  Pt remains safe on the unit, will continue to monitor.

## 2017-12-17 NOTE — Tx Team (Signed)
Interdisciplinary Treatment and Diagnostic Plan Update  12/17/2017 Time of Session: 0830AM AYDE RECORD MRN: [id number]  Principal Diagnosis: MDD, recurrent, severe, without psychotic features  Secondary Diagnoses: Active Problems:   Major depressive disorder, recurrent severe without psychotic features ([place])   Current Medications:  Current Facility-Administered Medications  Medication Dose Route Frequency Provider Last Rate Last Dose  . acetaminophen (TYLENOL) tablet 650 mg  650 mg Oral Q6H PRN Patrecia Pour, NP      . alum & mag hydroxide-simeth (MAALOX/MYLANTA) 200-200-20 MG/5ML suspension 30 mL  30 mL Oral Q4H PRN Patrecia Pour, NP      . cloNIDine (CATAPRES) tablet 0.1 mg  0.1 mg Oral QID Patrecia Pour, NP   Stopped at 12/17/17 0850   Followed by  . [START ON 12/18/2017] cloNIDine (CATAPRES) tablet 0.1 mg  0.1 mg Oral BID Patrecia Pour, NP       Followed by  . [START ON 12/21/2017] cloNIDine (CATAPRES) tablet 0.1 mg  0.1 mg Oral Daily Lord, Jamison Y, NP      . dicyclomine (BENTYL) tablet 20 mg  20 mg Oral Q6H PRN Patrecia Pour, NP      . feeding supplement (ENSURE ENLIVE) (ENSURE ENLIVE) liquid 237 mL  237 mL Oral TID BM Cobos, Fernando A, MD      . gabapentin (NEURONTIN) capsule 300 mg  300 mg Oral TID Patrecia Pour, NP   300 mg at 12/17/17 5732  . hydrOXYzine (ATARAX/VISTARIL) tablet 25 mg  25 mg Oral Q6H PRN Patrecia Pour, NP   25 mg at 12/17/17 0948  . loperamide (IMODIUM) capsule 2-4 mg  2-4 mg Oral PRN Patrecia Pour, NP      . magnesium hydroxide (MILK OF MAGNESIA) suspension 30 mL  30 mL Oral Daily PRN Patrecia Pour, NP      . methocarbamol (ROBAXIN) tablet 500 mg  500 mg Oral Q8H PRN Patrecia Pour, NP   500 mg at 12/16/17 2135  . naproxen (NAPROSYN) tablet 500 mg  500 mg Oral BID PRN Patrecia Pour, NP   500 mg at 12/16/17 2135  . nicotine (NICODERM CQ - dosed in mg/24 hours) patch 21 mg  21 mg Transdermal Daily Patrecia Pour, NP   21 mg at  12/17/17 0839  . ondansetron (ZOFRAN-ODT) disintegrating tablet 4 mg  4 mg Oral Q6H PRN Patrecia Pour, NP       PTA Medications: Medications Prior to Admission  Medication Sig Dispense Refill Last Dose  . acetaminophen (TYLENOL) 325 MG tablet Take 650 mg by mouth every 6 (six) hours as needed for moderate pain or headache.   Past Month at Unknown time  . acetaminophen (TYLENOL) 500 MG tablet Take 500 mg by mouth every 6 (six) hours as needed.   Not Taking at Unknown time  . ibuprofen (ADVIL,MOTRIN) 600 MG tablet Take 1 tablet (600 mg total) by mouth every 6 (six) hours. (Patient not taking: Reported on 09/08/2017) 30 tablet 0 Not Taking at Unknown time    Patient Stressors: Financial difficulties Marital or family conflict Substance abuse  Patient Strengths: Ability for insight Average or above average intelligence Capable of independent living [employer] of knowledge Motivation for treatment/growth Supportive family/friends  Treatment Modalities: Medication Management, Group therapy, Case management,  1 to 1 session with clinician, Psychoeducation, Recreational therapy.   Physician Treatment Plan for Primary Diagnosis: MDD, recurrent, severe, without psychotic features  Medication Management: Evaluate patient's response, side  effects, and tolerance of medication regimen.  Therapeutic Interventions: 1 to 1 sessions, Unit Group sessions and Medication administration.  Evaluation of Outcomes: Not Met  Physician Treatment Plan for Secondary Diagnosis: Active Problems:   Major depressive disorder, recurrent severe without psychotic features ([place])  Medication Management: Evaluate patient's response, side effects, and tolerance of medication regimen.  Therapeutic Interventions: 1 to 1 sessions, Unit Group sessions and Medication administration.  Evaluation of Outcomes: Not Met   RN Treatment Plan for Primary Diagnosis: MDD, recurrent, severe, without psychotic features Long  Term Goal(s): Knowledge of disease and therapeutic regimen to maintain health will improve  Short Term Goals: Ability to remain free from injury will improve, Ability to demonstrate self-control and Ability to disclose and discuss suicidal ideas  Medication Management: RN will administer medications as ordered by provider, will assess and evaluate patient's response and provide education to patient for prescribed medication. RN will report any adverse and/or side effects to prescribing provider.  Therapeutic Interventions: 1 on 1 counseling sessions, Psychoeducation, Medication administration, Evaluate responses to treatment, Monitor vital signs and CBGs as ordered, Perform/monitor CIWA, COWS, AIMS and Fall Risk screenings as ordered, Perform wound care treatments as ordered.  Evaluation of Outcomes: Progressing   LCSW Treatment Plan for Primary Diagnosis:MDD, recurrent, severe, without psychotic features Long Term Goal(s): Safe transition to appropriate next level of care at discharge, Engage patient in therapeutic group addressing interpersonal concerns.  Short Term Goals: Engage patient in aftercare planning with referrals and resources, Facilitate patient progression through stages of change regarding substance use diagnoses and concerns and Identify triggers associated with mental health/substance abuse issues  Therapeutic Interventions: Assess for all discharge needs, 1 to 1 time with Social worker, Explore available resources and support systems, Assess for adequacy in community support network, Educate family and significant other(s) on suicide prevention, Complete Psychosocial Assessment, Interpersonal group therapy.  Evaluation of Outcomes: Not Met   Progress in Treatment: Attending groups: No. New to unit. Continuing to assess.  Participating in groups: No. Taking medication as prescribed: Yes. Toleration medication: Yes. Family/Significant other contact made: No, will contact:   family member if patient consents Patient understands diagnosis: Yes. Discussing patient identified problems/goals with staff: Yes. Medical problems stabilized or resolved: Yes. Denies suicidal/homicidal ideation: Yes. Issues/concerns per patient self-inventory: No. Other: n/a   New problem(s) identified: No, Describe:  n/a  New Short Term/Long Term Goal(s): detox, medication management for mood stabilization, elimination of SI thoughts, development of comprehensive mental wellness/sobriety plan.   Discharge Plan or Barriers: CSW assessing for appropriate referrals. No current mental health providers noted. [place] pamphlet and AA/NA list provided for additional community supports.   Reason for Continuation of Hospitalization: Anxiety Depression Medication stabilization Suicidal ideation Withdrawal symptoms  Estimated Length of Stay: Monday, 12/20/17  Attendees: Patient: 12/17/2017 10:17 AM  Physician: Dr. Nancy Fetter MD; Dr. Sanjuana Letters MD 12/17/2017 10:17 AM  Nursing: Linna Hoff RN; [facility] RN 12/17/2017 10:17 AM  RN Care Manager:x 12/17/2017 10:17 AM  Social Worker: Press photographer, LCSW 12/17/2017 10:17 AM  Recreational Therapist: x 12/17/2017 10:17 AM  Other: Lindell Spar NP; Darnelle Maffucci Money NP 12/17/2017 10:17 AM  Other:  12/17/2017 10:17 AM  Other: 12/17/2017 10:17 AM    Scribe for Treatment Team: [place], LCSW 12/17/2017 10:17 AM

## 2017-12-17 NOTE — Progress Notes (Signed)
NUTRITION ASSESSMENT  Pt identified as at risk on the Malnutrition Screen Tool  INTERVENTION: 1. Educated patient on the importance of nutrition and encouraged intake of food and beverages. 2. Discussed weight goals. 3. Supplements: continue Ensure Enlive but will increase to TID, each supplement provides 350 kcal and 20 grams of protein   NUTRITION DIAGNOSIS: Unintentional weight loss related to sub-optimal intake as evidenced by pt report.   Goal: Pt to meet >/= 90% of their estimated nutrition needs.  Monitor:  PO intake  Assessment:  Pt admitted for depression, SI, and substance abuse. She had ben using heroin and crack cocaine. Per review, pt has lost 14 lbs (14% body weight) in the past 3.4 months which is significant for time frame.  Ensure Enlive already ordered BID and pt accepted both offerings yesterday; will increase to TID. Continue to encourage PO intakes of meals, supplements, and snacks.    26 y.o. female  Height: Ht Readings from Last 1 Encounters:  12/16/17 5' (1.524 m)    Weight: Wt Readings from Last 1 Encounters:  12/16/17 84 lb (38.1 kg)    Weight Hx: Wt Readings from Last 10 Encounters:  12/16/17 84 lb (38.1 kg)  09/08/17 92 lb 3.2 oz (41.8 kg)  08/29/17 98 lb (44.5 kg)  10/16/16 125 lb (56.7 kg)  10/12/16 125 lb 1.6 oz (56.7 kg)  10/08/16 122 lb (55.3 kg)  09/29/16 121 lb 8 oz (55.1 kg)  09/23/16 118 lb 9.6 oz (53.8 kg)  09/15/16 116 lb (52.6 kg)  09/01/16 114 lb 3.2 oz (51.8 kg)    BMI:  Body mass index is 16.41 kg/m. Pt meets criteria for underweight based on current BMI.  Estimated Nutritional Needs: Kcal: 25-30 kcal/kg Protein: > 1 gram protein/kg Fluid: 1 ml/kcal  Diet Order: Diet Heart Room service appropriate? Yes; Fluid consistency: Thin Pt is also offered choice of unit snacks mid-morning and mid-afternoon.  Pt is eating as desired.   Lab results and medications reviewed.      Trenton GammonJessica Maram Bently, MS, RD, LDN,  [facility] Inpatient Clinical Dietitian Pager # [phone] After hours/weekend pager # [phone]
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
## 2017-12-17 NOTE — Progress Notes (Signed)
The patient attended the evening A.A. Meeting and was appropriate.  

## 2017-12-18 DIAGNOSIS — F1410 Cocaine abuse, uncomplicated: Secondary | ICD-10-CM

## 2017-12-18 DIAGNOSIS — F1210 Cannabis abuse, uncomplicated: Secondary | ICD-10-CM

## 2017-12-18 NOTE — B[facility] Group Notes (Addendum)
LCSW Group Therapy Note  Date/Time:  12/18/2017   1:15-2:15pm  Type of Therapy and Topic:  Group Therapy:  Fears and Unhealthy/Healthy Coping Skills  Participation Level:  Active   Description of Group:  The focus of this group was to discuss some of the prevalent fears that patients experience, and to identify the commonalities among group members.  A fun exercise was used to initiate the discussion, followed by writing on the white board a group-generated list of unhealthy coping and healthy coping techniques to deal with each fear.    Therapeutic Goals: 1. Patient will be able to distinguish between healthy and unhealthy coping skills 2. Patient will identify and describe 3 fears they experience 3. Patient will identify one positive coping strategy for each fear they experience 4. Patient will respond empathetically to peers' statements regarding fears they experience  Summary of Patient Progress:  The patient expressed herself honestly and openly throughout group and she was open to the feedback from others.  She stated at the end of group she really needed the group in order to learn more coping skills and was given a list of 99 additional ones.  Therapeutic Modalities Cognitive Behavioral Therapy Motivational Interviewing  Ambrose MantleMareida Grossman-Orr, LCSW

## 2017-12-18 NOTE — Progress Notes (Signed)
D. Pt has been calm and cooperative- voicing no complaints- stating, "I feel much better today". Pt observed in milieu and attending group interacting appropriately with peers.Per pt's self inventory, pt rates her depression, hopelessness and anxiety a 2/0/0, respectively. Pt currently denies SI/HI and AVH. A. Labs and vitals monitored. Pt provided with a pitcher of water and encouraged to drink. Pt compliant with medications. Pt supported emotionally and encouraged to express concerns and ask questions.   R. Pt remains safe with 15 minute checks. Will continue POC.
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
## 2017-12-18 NOTE — Progress Notes (Signed)
[facility]  12/18/2017 10:58 AM [name] Atkin  MRN:  [id number] Subjective:  Alert oriented but dysphoric , having feeling aches and nausea HPI:  As per admission assessment " This 26 year old Caucasian female with hx of polysubstance use disorder including opioid drugs. [facility] with complaints of suicide ideations with plans to jump off a parking deck. She does have history of self mutilating behavior. Her UDS on admission was positive for Amphetamine, Opioid, Cocaine & THC. She does have strong familial hx of bipolar disorder/substance use disorder - mother & brother. There is a positive history of completed suicide: Brother. She is in need of drug detoxification & mood stabilization treatments."  On evaluation today she is somewhat calmer, still sweating and feel aches, having withdrawals and is on detox with clonidine and librium endores anxiety . Not psychotic or agitated. Denies suicidal intent Insight low but endoreses motivation to continue treatment Has been using heroin, cocaien and THC Vitals being monitored. Started on medications    Principal Problem: Major depressive disorder, recurrent severe without psychotic features (HCC) Diagnosis:   Patient Active Problem List   Diagnosis Date Noted  . Polysubstance dependence including opioid type drug without complication, episodic abuse (HCC) [F19.20] 12/16/2017  . Major depressive disorder, recurrent severe without psychotic features (HCC) [F33.2] 12/16/2017  . Normal labor [O80, Z37.9] 10/16/2016  . Cigarette smoker [F17.210] 08/18/2016  . 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 Hepatitis C, chronic, maternal, antepartum (HCC) [O98.419, B18.2] 08/04/2016  . Supervision of high-risk pregnancy [O09.90] 07/21/2016  . History of substance abuse [Z87.898] 07/21/2016  . ASCUS with positive high risk HPV [IMO0002] 07/21/2016    Total Time spent with patient: 30 minutes  Past Psychiatric History: polysubstance use. Bipolar  Past Medical History:  Past Medical History:  Diagnosis Date  . History of RPR test 07/21/2016   02/2016: 1:1 with negative TPA testing [ ]  f/u 28wks [ ]  f/u admit RPR [ ]  f/u PP RPR  . History of self-harm   . Polysubstance abuse [facility](HCC)     Past Surgical History:  Procedure Laterality Date  . TONSILLECTOMY     Family History:  Family History  Problem Relation Age of Onset  . Vision loss Mother   . Depression Mother   . Ulcers Mother   . Gallbladder disease Mother   . Mental illness Mother   . Thyroid disease Mother   . Allergies Father   . Vision loss Father   . Vision loss Maternal Grandmother   . Ulcers Maternal Grandmother   . Gallbladder disease Maternal Grandmother   . Diabetes Maternal Grandmother   . Vision loss Maternal Grandfather   . Heart disease Maternal Grandfather   . Cancer Maternal Grandfather   . Allergies Paternal Grandmother   . Vision loss Paternal Grandmother   . Gallbladder disease Paternal Grandmother   . Arthritis Paternal Grandmother   . Cancer Paternal Grandmother   . Allergies Paternal Grandfather   . Vision loss Paternal Grandfather    Family Psychiatric  History: see chart Social History:  Social History   Substance and Sexual Activity  Alcohol Use No     Social History   Substance and Sexual Activity  Drug Use Yes  . Frequency: 5.0 times per week  . Types: Cocaine, Marijuana, Methamphetamines   Comment: Heroin    Social History   Socioeconomic History  . Marital status:  Single    Spouse name: None  . Number of children: None  . Years of education: None  . Highest education level: None  Social Needs  . Financial resource strain: None  . Food insecurity - worry: None  . Food insecurity - inability: None  . Transportation needs - medical: None  . Transportation needs - non-medical: None  Occupational History  . None   Tobacco Use  . Smoking status: Current Every Day Smoker    Packs/day: 0.00    Types: Cigarettes  . Smokeless tobacco: Never Used  Substance and Sexual Activity  . Alcohol use: No  . Drug use: Yes    Frequency: 5.0 times per week    Types: Cocaine, Marijuana, Methamphetamines    Comment: Heroin  . Sexual activity: Yes    Partners: Male  Other Topics Concern  . None  Social History Narrative  . None   Additional Social History:                         Sleep: Fair  Appetite:  Poor  Current Medications: Current Facility-Administered Medications  Medication Dose Route Frequency Provider Last Rate Last Dose  . acetaminophen (TYLENOL) tablet 650 mg  650 mg Oral Q6H PRN Charm Rings, NP      . alum & mag hydroxide-simeth (MAALOX/MYLANTA) 200-200-20 MG/5ML suspension 30 mL  30 mL Oral Q4H PRN Charm Rings, NP      . chlordiazePOXIDE (LIBRIUM) capsule 25 mg  25 mg Oral QID PRN Armandina Stammer I, NP      . cloNIDine (CATAPRES) tablet 0.1 mg  0.1 mg Oral QID Charm Rings, NP   0.1 mg at 12/17/17 1742   Followed by  . cloNIDine (CATAPRES) tablet 0.1 mg  0.1 mg Oral BID Charm Rings, NP       Followed by  . [START ON 12/21/2017] cloNIDine (CATAPRES) tablet 0.1 mg  0.1 mg Oral Daily Lord, Jamison Y, NP      . dicyclomine (BENTYL) tablet 20 mg  20 mg Oral Q6H PRN Charm Rings, NP      . feeding supplement (ENSURE ENLIVE) (ENSURE ENLIVE) liquid 237 mL  237 mL Oral TID BM Cobos, Rockey Situ, MD   237 mL at 12/18/17 0931  . gabapentin (NEURONTIN) capsule 300 mg  300 mg Oral TID PC & HS Nwoko, Agnes I, NP   300 mg at 12/18/17 0931  . hydrOXYzine (ATARAX/VISTARIL) tablet 25 mg  25 mg Oral Q6H PRN Charm Rings, NP   25 mg at 12/17/17 0948  . loperamide (IMODIUM) capsule 2-4 mg  2-4 mg Oral PRN Charm Rings, NP      . magnesium hydroxide (MILK OF MAGNESIA) suspension 30 mL  30 mL Oral Daily PRN Charm Rings, NP      . methocarbamol (ROBAXIN) tablet 500 mg  500 mg  Oral Q8H PRN Charm Rings, NP   500 mg at 12/17/17 2100  . mirtazapine (REMERON) tablet 15 mg  15 mg Oral QHS Nwoko, Agnes I, NP   15 mg at 12/17/17 2059  . naproxen (NAPROSYN) tablet 500 mg  500 mg Oral BID PRN Charm Rings, NP   500 mg at 12/16/17 2135  . nicotine (NICODERM CQ - dosed in mg/24 hours) patch 21 mg  21 mg Transdermal Daily Charm Rings, NP   21 mg at 12/18/17 0932  . ondansetron (ZOFRAN-ODT) disintegrating tablet 4 mg  4 mg Oral Q6H PRN Charm RingsLord, Jamison Y, NP      . risperiDONE (RISPERDAL) tablet 1 mg  1 mg Oral QHS Nwoko, Agnes I, NP   1 mg at 12/17/17 2059    Lab Results: No results found for this or any previous visit (from the past 48 hour(s)).  Blood Alcohol level:  Lab Results  Component Value Date   ETH <10 12/16/2017    Metabolic Disorder Labs: No results found for: HGBA1C, MPG No results found for: PROLACTIN No results found for: CHOL, TRIG, HDL, CHOLHDL, VLDL, LDLCALC  Physical Findings: AIMS: Facial and Oral Movements Muscles of Facial Expression: None, normal Lips and Perioral Area: None, normal Jaw: None, normal Tongue: None, normal,Extremity Movements Upper (arms, wrists, hands, fingers): None, normal Lower (legs, knees, ankles, toes): None, normal, Trunk Movements Neck, shoulders, hips: None, normal, Overall Severity Severity of abnormal movements (highest score from questions above): None, normal Incapacitation due to abnormal movements: None, normal Patient's awareness of abnormal movements (rate only patient's report): No Awareness, Dental Status Current problems with teeth and/or dentures?: No Does patient usually wear dentures?: No  CIWA:    COWS:  COWS Total Score: 6  Musculoskeletal: Strength & Muscle Tone: within normal limits Gait & Station: normal Patient leans: no lean  Psychiatric Specialty Exam: Physical Exam  Review of Systems  Constitutional: Positive for malaise/fatigue.  Cardiovascular: Negative for chest pain.   Skin: Negative for rash.  Psychiatric/Behavioral: Positive for depression and substance abuse. The patient is nervous/anxious.     Blood pressure 96/63, pulse 86, temperature 98.2 F (36.8 C), temperature source Oral, resp. rate 16, height 5' (1.524 m), weight 38.1 kg (84 lb), unknown if currently breastfeeding.Body mass index is 16.41 kg/m.  General Appearance: Casual  Eye Contact:  Fair  Speech:  Slow  Volume:  Decreased  Mood:  Depressed  Affect:  Congruent  Thought Process:  Goal Directed  Orientation:  Full (Time, Place, and Person)  Thought Content:  Rumination  Suicidal Thoughts:  No  Homicidal Thoughts:  No  Memory:  Immediate;   Fair Recent;   Fair  Judgement:  Poor  Insight:  Shallow  Psychomotor Activity:  Increased  Concentration:  Concentration: Fair and Attention Span: Fair  Recall:  [employer]  Fund of Knowledge:  Fair  Language:  Fair  Akathisia:  Negative  Handed:  Right  AIMS (if indicated):     Assets:  Desire for Improvement Social Support  ADL's:  Intact  Cognition:  WNL  Sleep:  Number of Hours: 6.75     Treatment Plan Summary: Daily contact with patient to assess and evaluate symptoms and progress in treatment, Medication management and Plan as follows  1. Opioid use disorder with withdrawals. Continue clonidine  Continue librium for anxiety and withdrawals. Has been using meth, alcohol , thc 2. Bipolar disorder: started low dose risperdal for now Need to monitor withdrawals and consider adjusting mood stabilizers as it gets controlled Continue remeron, neurontin for depression and anxiety Vitals stable  Thresa RossNadeem Nazaiah Navarrete, MD 12/18/2017, 10:58 AM

## 2017-12-18 NOTE — Progress Notes (Signed)
D.  Pt pleasant on approach, complaint of continued withdrawal symptoms.  Pt was positive for evening AA group, observed interacting appropriately with peers on the unit.  Pt denies SI/HI/AVH at this time.  A.  Support and encouragement offered, medication given as ordered for withdrawal.  R.  Pt remains safe on the unit, will continue to monitor.

## 2017-12-19 NOTE — [facility] Group Notes (Signed)
[facility] LCSW Group Therapy Note  Date/Time:  12/19/2017 1:30-2:30pm  Type of Therapy and Topic:  Group Therapy:  Healthy and Unhealthy Supports  Participation Level:  Minimal   Description of Group:  Patients in this group were introduced to the idea of adding a variety of healthy supports to address the various needs in their lives.  Because of one patient's resistance and insistence that drugs help rather than harm, the Stages of Change were introduced and ideas proposed about appropriate supports for each stage.  Therapeutic Goals:   1)  discuss importance of adding supports to stay well once out of the hospital  2)  compare healthy versus unhealthy supports and identify some examples of each  3)  Describe Stages of Change  3)  generate ideas and descriptions of healthy supports that can be added for each Stage  5)  encourage active participation in and adherence to discharge plan    Summary of Patient Progress:  The patient shared well in group and gave feedback to others initially, telling the resistant patient that she has lost everything in her life that was good because of drugs.  She eventually fell asleep, remained asleep until the end of group.   Therapeutic Modalities:   Motivational Interviewing Brief Solution-Focused Therapy  Ambrose MantleMareida Grossman-Orr, LCSW

## 2017-12-19 NOTE — Progress Notes (Signed)
[facility]  12/19/2017 10:31 AM [name]  MRN:  [id number] Subjective:  Alert oriented but dysphoric , having feeling aches and nausea HPI:  As per admission assessment " This 26 year old Caucasian female with hx of polysubstance use disorder including opioid drugs. [facility] with complaints of suicide ideations with plans to jump off a parking deck. She does have history of self mutilating behavior. Her UDS on admission was positive for Amphetamine, Opioid, Cocaine & THC. She does have strong familial hx of bipolar disorder/substance use disorder - mother & brother. There is a positive history of completed suicide: Brother. She is in need of drug detoxification & mood stabilization treatments."  On evaluation today feels better, calmer. Not sweating. Slept better. On remeron Tolerating meds. No withdrawals   Has been using heroin, cocaien and THC Vitals stable Psych education done.    Principal Problem: Major depressive disorder, recurrent severe without psychotic features (HCC) Diagnosis:   Patient Active Problem List   Diagnosis Date Noted  . Polysubstance dependence including opioid type drug without complication, episodic abuse (HCC) [F19.20] 12/16/2017  . Major depressive disorder, recurrent severe without psychotic features (HCC) [F33.2] 12/16/2017  . Normal labor [O80, Z37.9] 10/16/2016  . Cigarette smoker [F17.210] 08/18/2016  . 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 Hepatitis C, chronic, maternal, antepartum (HCC) [O98.419, B18.2] 08/04/2016  . Supervision of high-risk pregnancy [O09.90] 07/21/2016  . History of substance abuse [Z87.898] 07/21/2016  . ASCUS with positive high risk HPV [IMO0002] 07/21/2016   Total Time spent with patient: 30 minutes  Past Psychiatric History: polysubstance use. Bipolar  Past Medical History:  Past Medical History:   Diagnosis Date  . History of RPR test 07/21/2016   02/2016: 1:1 with negative TPA testing [ ]  f/u 28wks [ ]  f/u admit RPR [ ]  f/u PP RPR  . History of self-harm   . Polysubstance abuse [facility](HCC)     Past Surgical History:  Procedure Laterality Date  . TONSILLECTOMY     Family History:  Family History  Problem Relation Age of Onset  . Vision loss Mother   . Depression Mother   . Ulcers Mother   . Gallbladder disease Mother   . Mental illness Mother   . Thyroid disease Mother   . Allergies Father   . Vision loss Father   . Vision loss Maternal Grandmother   . Ulcers Maternal Grandmother   . Gallbladder disease Maternal Grandmother   . Diabetes Maternal Grandmother   . Vision loss Maternal Grandfather   . Heart disease Maternal Grandfather   . Cancer Maternal Grandfather   . Allergies Paternal Grandmother   . Vision loss Paternal Grandmother   . Gallbladder disease Paternal Grandmother   . Arthritis Paternal Grandmother   . Cancer Paternal Grandmother   . Allergies Paternal Grandfather   . Vision loss Paternal Grandfather    Family Psychiatric  History: see chart Social History:  Social History   Substance and Sexual Activity  Alcohol Use No     Social History   Substance and Sexual Activity  Drug Use Yes  . Frequency: 5.0 times per week  . Types: Cocaine, Marijuana, Methamphetamines   Comment: Heroin    Social History   Socioeconomic History  . Marital status: Single    Spouse name: None  . Number of children: None  . Years of education: None  . Highest education  level: None  Social Needs  . Financial resource strain: None  . Food insecurity - worry: None  . Food insecurity - inability: None  . Transportation needs - medical: None  . Transportation needs - non-medical: None  Occupational History  . None  Tobacco Use  . Smoking status: Current Every Day Smoker    Packs/day: 0.00    Types: Cigarettes  . Smokeless tobacco: Never Used  Substance and  Sexual Activity  . Alcohol use: No  . Drug use: Yes    Frequency: 5.0 times per week    Types: Cocaine, Marijuana, Methamphetamines    Comment: Heroin  . Sexual activity: Yes    Partners: Male  Other Topics Concern  . None  Social History Narrative  . None   Additional Social History:                         Sleep: Fair  Appetite:  Poor  Current Medications: Current Facility-Administered Medications  Medication Dose Route Frequency Provider Last Rate Last Dose  . acetaminophen (TYLENOL) tablet 650 mg  650 mg Oral Q6H PRN Charm Rings, NP      . alum & mag hydroxide-simeth (MAALOX/MYLANTA) 200-200-20 MG/5ML suspension 30 mL  30 mL Oral Q4H PRN Charm Rings, NP      . chlordiazePOXIDE (LIBRIUM) capsule 25 mg  25 mg Oral QID PRN Armandina Stammer I, NP   25 mg at 12/18/17 1944  . cloNIDine (CATAPRES) tablet 0.1 mg  0.1 mg Oral BID Charm Rings, NP   0.1 mg at 12/18/17 1819   Followed by  . [START ON 12/21/2017] cloNIDine (CATAPRES) tablet 0.1 mg  0.1 mg Oral Daily Lord, Jamison Y, NP      . dicyclomine (BENTYL) tablet 20 mg  20 mg Oral Q6H PRN Charm Rings, NP      . feeding supplement (ENSURE ENLIVE) (ENSURE ENLIVE) liquid 237 mL  237 mL Oral TID BM Cobos, Rockey Situ, MD   237 mL at 12/19/17 0955  . gabapentin (NEURONTIN) capsule 300 mg  300 mg Oral TID PC & HS Nwoko, Agnes I, NP   300 mg at 12/18/17 2110  . hydrOXYzine (ATARAX/VISTARIL) tablet 25 mg  25 mg Oral Q6H PRN Charm Rings, NP   25 mg at 12/18/17 1944  . loperamide (IMODIUM) capsule 2-4 mg  2-4 mg Oral PRN Charm Rings, NP      . magnesium hydroxide (MILK OF MAGNESIA) suspension 30 mL  30 mL Oral Daily PRN Charm Rings, NP      . methocarbamol (ROBAXIN) tablet 500 mg  500 mg Oral Q8H PRN Charm Rings, NP   500 mg at 12/18/17 2110  . mirtazapine (REMERON) tablet 15 mg  15 mg Oral QHS Nwoko, Agnes I, NP   15 mg at 12/18/17 2110  . naproxen (NAPROSYN) tablet 500 mg  500 mg Oral BID PRN Charm Rings, NP   500 mg at 12/18/17 1820  . nicotine (NICODERM CQ - dosed in mg/24 hours) patch 21 mg  21 mg Transdermal Daily Charm Rings, NP   21 mg at 12/19/17 0956  . ondansetron (ZOFRAN-ODT) disintegrating tablet 4 mg  4 mg Oral Q6H PRN Charm Rings, NP      . risperiDONE (RISPERDAL) tablet 1 mg  1 mg Oral QHS Armandina Stammer I, NP   1 mg at 12/18/17 2110    Lab Results: No results  found for this or any previous visit (from the past 48 hour(s)).  Blood Alcohol level:  Lab Results  Component Value Date   ETH <10 12/16/2017    Metabolic Disorder Labs: No results found for: HGBA1C, MPG No results found for: PROLACTIN No results found for: CHOL, TRIG, HDL, CHOLHDL, VLDL, LDLCALC  Physical Findings: AIMS: Facial and Oral Movements Muscles of Facial Expression: None, normal Lips and Perioral Area: None, normal Jaw: None, normal Tongue: None, normal,Extremity Movements Upper (arms, wrists, hands, fingers): None, normal Lower (legs, knees, ankles, toes): None, normal, Trunk Movements Neck, shoulders, hips: None, normal, Overall Severity Severity of abnormal movements (highest score from questions above): None, normal Incapacitation due to abnormal movements: None, normal Patient's awareness of abnormal movements (rate only patient's report): No Awareness, Dental Status Current problems with teeth and/or dentures?: No Does patient usually wear dentures?: No  CIWA:    COWS:  COWS Total Score: 2  Musculoskeletal: Strength & Muscle Tone: within normal limits Gait & Station: normal Patient leans: no lean  Psychiatric Specialty Exam: Physical Exam  Review of Systems  Constitutional: Positive for malaise/fatigue.  Cardiovascular: Negative for palpitations.  Skin: Negative for rash.  Psychiatric/Behavioral: Positive for substance abuse. The patient is nervous/anxious.     Blood pressure 101/60, pulse 84, temperature 98.3 F (36.8 C), temperature source Oral, resp. rate 16,  height 5' (1.524 m), weight 38.1 kg (84 lb), unknown if currently breastfeeding.Body mass index is 16.41 kg/m.  General Appearance: Casual  Eye Contact:  Fair  Speech:  Slow  Volume:  Decreased  Mood: somewhat dysphoric  Affect:  Congruent cooperative  Thought Process:  Goal Directed  Orientation:  Full (Time, Place, and Person)  Thought Content:  Rumination  Suicidal Thoughts:  No  Homicidal Thoughts:  No  Memory:  Immediate;   Fair Recent;   Fair  Judgement:  Poor  Insight:  Shallow  Psychomotor Activity:  Increased  Concentration:  Concentration: Fair and Attention Span: Fair  Recall:  [employer]  Fund of Knowledge:  Fair  Language:  Fair  Akathisia:  Negative  Handed:  Right  AIMS (if indicated):     Assets:  Desire for Improvement Social Support  ADL's:  Intact  Cognition:  WNL  Sleep:  Number of Hours: 6.75     Treatment Plan Summary: Daily contact with patient to assess and evaluate symptoms and progress in treatment, Medication management and Plan as follows  1. Opioid use disorder with withdrawals. Improved. Continue clonidine Continue librium for anxiety and withdrawals. Has been using meth, alcohol , thc 2. Bipolar disorder: continue risperdal. No tremors  Possible dc in 2-3 days  Holly RossNadeem Harmonii Karle, MD 12/19/2017, 10:31 AM

## 2017-12-19 NOTE — [facility] Suicide Risk Assessment (Addendum)
[facility] INPATIENT:  Family/Significant Other Suicide Prevention Education  Suicide Prevention Education:  Contact Attempts: significant other Ernestine McmurrayBrandon Smith [phone] has been identified by the patient as the family member/significant other with whom the patient will be residing, and identified as the person(s) who will aid the patient in the event of a mental health crisis.  With written consent from the patient, two attempts were made to provide suicide prevention education, prior to and/or following the patient's discharge.  We were unsuccessful in providing suicide prevention education.  A suicide education pamphlet was given to the patient to share with family/significant other.  Date and time of first attempt:   12/19/2017  /9:33am - message left Date and time of second attempt:    12/19/2017  /  4:37pm - reached a non-English speaking woman who said this was the wrong number  Lynnell ChadMareida J Grossman-Orr 12/19/2017, 9:34 AM

## 2017-12-19 NOTE — [facility] Group Notes (Signed)
The focus of this group is to educate the patient on the purpose and policies of crisis stabilization and provide a format to answer questions about their admission.  The group details unit policies and expectations of patients while admitted.  Patient did not attend 0900 nurse education orientation group this morning.  Patient stayed in bed.   

## 2017-12-19 NOTE — Progress Notes (Signed)
Nursing Progress Note: 7p-7a D: Pt currently presents with a anxious/pleasant affect and behavior. Pt states "I feel much better. I am ready to go. I feel great." Interacting appropriately with the milieu. Pt reports good sleep during the previous night with current medication regimen. Pt did attend wrap-up group.  A: Pt provided with medications per providers orders. Pt's labs and vitals were monitored throughout the night. Pt supported emotionally and encouraged to express concerns and questions. Pt educated on medications.  R: Pt's safety ensured with 15 minute and environmental checks. Pt currently denies SI, HI, and AVH. Pt verbally contracts to seek staff if SI,HI, or AVH occurs and to consult with staff before acting on any harmful thoughts. Will continue to monitor.

## 2017-12-19 NOTE — Progress Notes (Signed)
D. Pt observed interacting appropriately with peers in milieu. Pt has been calm and cooperative- denies pain and withdrawal symptoms. Per pt's self inventory, pt rates her depression, hopelessness and anxiety a 0/0/0, respectively. Pt reports being eager to go home.  Pt currently denies SI/HI and AVH.  A. Labs and vitals monitored. Pt compliant with medications. Pt supported emotionally and encouraged to express concerns and ask questions.   R. Pt remains safe with 15 minute checks. Will continue POC.

## 2017-12-20 MED ORDER — HYDROXYZINE HCL 25 MG PO TABS: 25.0000 mg | ORAL_TABLET | Freq: Four times a day (QID) | ORAL | 0 refills | Status: DC | PRN

## 2017-12-20 MED ORDER — RISPERIDONE 1 MG PO TABS: 1.0000 mg | ORAL_TABLET | Freq: Every day | ORAL | 0 refills | Status: DC

## 2017-12-20 MED ORDER — GABAPENTIN 300 MG PO CAPS: 300.0000 mg | ORAL_CAPSULE | Freq: Three times a day (TID) | ORAL | 0 refills | Status: DC

## 2017-12-20 MED ORDER — NICOTINE 21 MG/24HR TD PT24: 21.0000 mg | MEDICATED_PATCH | Freq: Every day | TRANSDERMAL | 0 refills | Status: DC

## 2017-12-20 MED ORDER — ACETAMINOPHEN 500 MG PO TABS: 500.0000 mg | ORAL_TABLET | Freq: Four times a day (QID) | ORAL | 0 refills | Status: DC | PRN

## 2017-12-20 MED ORDER — MIRTAZAPINE 15 MG PO TABS: 15.0000 mg | ORAL_TABLET | Freq: Every day | ORAL | 0 refills | Status: DC

## 2017-12-20 NOTE — [facility] Suicide Risk Assessment (Signed)
[facility]   Principal Problem: Major depressive disorder, recurrent severe without psychotic features [facility](HCC) Discharge Diagnoses:  Patient Active Problem List   Diagnosis Date Noted  . Polysubstance dependence including opioid type drug without complication, episodic abuse (HCC) [F19.20] 12/16/2017  . Major depressive disorder, recurrent severe without psychotic features (HCC) [F33.2] 12/16/2017  . Normal labor [O80, Z37.9] 10/16/2016  . Cigarette smoker [F17.210] 08/18/2016  . 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 Hepatitis C, chronic, maternal, antepartum (HCC) [O98.419, B18.2] 08/04/2016  . Supervision of high-risk pregnancy [O09.90] 07/21/2016  . History of substance abuse [Z87.898] 07/21/2016  . ASCUS with positive high risk HPV [IMO0002] 07/21/2016    Total Time spent with patient: 30 minutes  Musculoskeletal: Strength & Muscle Tone: within normal limits Gait & Station: normal Patient leans: no lean  Psychiatric Specialty Exam: Review of Systems  Cardiovascular: Negative for chest pain and palpitations.  Psychiatric/Behavioral: Negative for depression.    Blood pressure 91/62, pulse 77, temperature 97.9 F (36.6 C), temperature source Oral, resp. rate 16, height 5' (1.524 m), weight 38.1 kg (84 lb), unknown if currently breastfeeding.Body mass index is 16.41 kg/m.  General Appearance: Casual  Eye Contact::  Good  Speech:  Slow409  Volume:  Normal  Mood:  Euthymic  Affect:  Congruent  Thought Process:  Goal Directed  Orientation:  Full (Time, Place, and Person)  Thought Content:  Logical  Suicidal Thoughts:  No  Homicidal Thoughts:  No  Memory:  Immediate;   Fair Recent;   Fair  Judgement:  Fair  Insight:  Fair  Psychomotor Activity:  Normal  Concentration:  Good  Recall:  [employer]Fair  Fund of Knowledge:Fair  Language: Fair  Akathisia:  Negative  Handed:  Right  AIMS (if  indicated):     Assets:  Desire for Improvement  Sleep:  Number of Hours: 6.75  Cognition: WNL  ADL's:  Intact   Mental Status Per Nursing Assessment::   On Admission:     Demographic Factors:  Caucasian  Loss Factors: Financial problems/change in socioeconomic status  Historical Factors: Impulsivity  Risk Reduction Factors:   Sense of responsibility to family, Positive social support and Positive therapeutic relationship  Continued Clinical Symptoms:  Dysthymia  Cognitive Features That Contribute To Risk:  None    Suicide Risk:  Minimal: No identifiable suicidal ideation.  Patients presenting with no risk factors but with morbid ruminations; may be classified as minimal risk based on the severity of the depressive symptoms  Follow-up Information    [facility] Follow up.   Specialty:  Professional Counselor Why:  Social worker will call for a follow-up appointment. Contact information: [facility] [address] [place] [address] [phone]          Discharge today. Follow up with appointments and medications compliance.  May join AA or support groups or join [place] [facility].  No withdrawals and stable  Plan Of Care/Follow-up recommendations:  Activity:  as tolerated Diet:  regular  Thresa RossNadeem Tamika Nou, MD 12/20/2017, 10:32 AM

## 2017-12-20 NOTE — Progress Notes (Signed)
Patient discharged to lobby. Patient was stable and appreciative at that time. All papers and prescriptions were given and valuables returned. Verbal understanding expressed. Denies SI/HI and A/VH. Patient given opportunity to express concerns and ask questions.  
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
## 2017-12-20 NOTE — Progress Notes (Signed)
Recreation Therapy Notes  Date: 12/20/17 Time: 0930 Location: 300 Hall Group Room  Group Topic: Stress Management  Goal Area(s) Addresses:  Patient will verbalize importance of using healthy stress management.  Patient will identify positive emotions associated with healthy stress management.   Intervention: Stress Management  Activity :  Meditation.  LRT introduced the stress management technique of meditation.  LRT played a meditation from the Calm app.  Patients were to listen and follow along as meditation was played.  Education:  Stress Management, Discharge Planning.   Education Outcome: Acknowledges edcuation/In group clarification offered/Needs additional education  Clinical Observations/Feedback: Pt did not attend group.   Brandyn Thien, LRT/CTRS         Renata Gambino A 12/20/2017 12:03 PM 

## 2017-12-20 NOTE — Progress Notes (Signed)
  [facility] Discharge Plan :  Will you be returning to the same living situation after discharge:  Yes,  home At discharge, do you have transportation home?: Yes,  family Do you have the ability to pay for your medications: Yes,  mental health  Release of information consent forms completed and in the chart;  Patient's signature needed at discharge.  Patient to Follow up at: Follow-up Information    [facility] Follow up.   Specialty:  Professional Counselor Why:  Go to the walk-in clinc M-F between 9 and 4 for your hospital follow up appointment.  Bring ID, hospital d/c paperwork and insurance card Contact information: [facility] [address] [place] [address] [phone]           Next level of care provider has access to [facility] Link:no  Safety Planning and Suicide Prevention discussed: Yes,  yes  Have you used any form of tobacco in the last 30 days? (Cigarettes, Smokeless Tobacco, Cigars, and/or Pipes): Yes  Has patient been referred to the Quitline?: Patient refused referral  Patient has been referred for addiction treatment: Pt. refused referral  Ida RogueRodney B Mccartney Brucks, LCSW 12/20/2017, 10:57 AM

## 2018-02-02 ENCOUNTER — Ambulatory Visit (HOSPITAL_COMMUNITY)
Admission: RE | Admit: 2018-02-02 | Discharge: 2018-02-02 | Disposition: A | Discharge status: Home / Self Care | Payer: Medicaid Other | Attending: Psychiatry | Admitting: Psychiatry

## 2018-02-02 ENCOUNTER — Emergency Department (HOSPITAL_COMMUNITY)
Admission: EM | Admit: 2018-02-02 | Discharge: 2018-02-03 | Disposition: A | Discharge status: Other Acute Inpatient Hospital | Payer: Medicaid Other | Attending: Emergency Medicine | Admitting: Emergency Medicine

## 2018-02-02 ENCOUNTER — Other Ambulatory Visit: Payer: Self-pay

## 2018-02-02 DIAGNOSIS — Z1330 Encounter for screening examination for mental health and behavioral disorders, unspecified: Secondary | ICD-10-CM | POA: Diagnosis present

## 2018-02-02 DIAGNOSIS — F1920 Other psychoactive substance dependence, uncomplicated: Secondary | ICD-10-CM | POA: Insufficient documentation

## 2018-02-02 DIAGNOSIS — R45851 Suicidal ideations: Secondary | ICD-10-CM | POA: Insufficient documentation

## 2018-02-02 DIAGNOSIS — F1110 Opioid abuse, uncomplicated: Secondary | ICD-10-CM | POA: Insufficient documentation

## 2018-02-02 DIAGNOSIS — F1910 Other psychoactive substance abuse, uncomplicated: Secondary | ICD-10-CM | POA: Diagnosis not present

## 2018-02-02 DIAGNOSIS — Z79899 Other long term (current) drug therapy: Secondary | ICD-10-CM | POA: Diagnosis not present

## 2018-02-02 DIAGNOSIS — F329 Major depressive disorder, single episode, unspecified: Secondary | ICD-10-CM | POA: Diagnosis present

## 2018-02-02 DIAGNOSIS — F17210 Nicotine dependence, cigarettes, uncomplicated: Secondary | ICD-10-CM | POA: Insufficient documentation

## 2018-02-02 DIAGNOSIS — F322 Major depressive disorder, single episode, severe without psychotic features: Secondary | ICD-10-CM | POA: Diagnosis not present

## 2018-02-02 DIAGNOSIS — F1410 Cocaine abuse, uncomplicated: Secondary | ICD-10-CM | POA: Insufficient documentation

## 2018-02-02 DIAGNOSIS — F332 Major depressive disorder, recurrent severe without psychotic features: Secondary | ICD-10-CM | POA: Insufficient documentation

## 2018-02-02 LAB — I-STAT BETA HCG BLOOD, ED (MC, WL, AP ONLY)

## 2018-02-02 LAB — CBC
HEMATOCRIT: 39.7 % (ref 36.0–46.0)
Hemoglobin: 13 g/dL (ref 12.0–15.0)
MCH: 30.8 pg (ref 26.0–34.0)
MCHC: 32.7 g/dL (ref 30.0–36.0)
MCV: 94.1 fL (ref 78.0–100.0)
Platelets: 342 10*3/uL (ref 150–400)
RBC: 4.22 MIL/uL (ref 3.87–5.11)
RDW: 13.6 % (ref 11.5–15.5)
WBC: 10.8 10*3/uL — AB (ref 4.0–10.5)

## 2018-02-02 NOTE — H&P (Signed)
Behavioral Health Medical Screening Exam  [name] is an 27 y.o. female who [facility], as [facility] endorsing polysubstance abuse (heroin and cocaine), using 30$/day commensurate with worsening MDD symptoms over 2 weeks duration. She is also endorsing SI with plan, intent and means. She is denying nay co-morbid medical conditions and or acute ailments.  Total Time spent with patient: 20 minutes  Psychiatric Specialty Exam: Physical Exam  Constitutional: She is oriented to person, place, and time. No distress.  HENT:  Head: Normocephalic.  Eyes: Pupils are equal, round, and reactive to light.  Respiratory: Effort normal and breath sounds normal. No respiratory distress.  Neurological: She is alert and oriented to person, place, and time. No cranial nerve deficit.  Skin: Skin is warm and dry. She is not diaphoretic.  Psychiatric: Her speech is normal. She is withdrawn. Cognition and memory are impaired. She expresses impulsivity. She exhibits a depressed mood. She expresses suicidal ideation. She expresses suicidal plans.    Review of Systems  Constitutional: Negative for chills, fever and malaise/fatigue.  Neurological: Negative for weakness.  Psychiatric/Behavioral: Positive for depression, substance abuse and suicidal ideas. The patient is nervous/anxious and has insomnia.   All other systems reviewed and are negative.   unknown if currently breastfeeding.There is no height or weight on file to calculate BMI.  General Appearance: Disheveled  Eye Contact:  Good  Speech:  Clear and Coherent  Volume:  Normal  Mood:  Depressed  Affect:  Congruent  Thought Process:  Goal Directed  Orientation:  Full (Time, Place, and Person)  Thought Content:  Logical  Suicidal Thoughts:  Yes.  with intent/plan  Homicidal Thoughts:  No  Memory:  Immediate;   Good  Judgement:  Impaired  Insight:  Lacking  Psychomotor Activity:  Normal  Concentration: Concentration: Fair  Recall:   [employer]Fair  Fund of Knowledge:Fair  Language: Fair  Akathisia:  Negative  Handed:  Right  AIMS (if indicated):     Assets:  Desire for Improvement  Sleep:       Musculoskeletal: Strength & Muscle Tone: within normal limits Gait & Station: normal Patient leans: N/A  unknown if currently breastfeeding.  Recommendations:  Based on my evaluation the patient appears to have an emergency medical condition for which I recommend the patient be transferred to the emergency department for further evaluation.  Kerry HoughSpencer E Aloise Copus, PA-C 02/02/2018, 10:03 PM
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
## 2018-02-02 NOTE — ED Triage Notes (Signed)
Patient states she has been off her medications for few days. Patient states she feels like she wants to kill herself. Patient does not have a plan.

## 2018-02-02 NOTE — ED Notes (Signed)
Bed: WTR5 Expected date:  Expected time:  Means of arrival:  Comments: 

## 2018-02-03 ENCOUNTER — Inpatient Hospital Stay (HOSPITAL_COMMUNITY)
Admission: AD | Admit: 2018-02-03 | Discharge: 2018-02-06 | DRG: 885 | Disposition: A | Discharge status: Home / Self Care | Payer: Medicaid Other | Source: Intra-hospital | Attending: Psychiatry | Admitting: Psychiatry

## 2018-02-03 ENCOUNTER — Encounter (HOSPITAL_COMMUNITY): Payer: Self-pay | Admitting: Nurse Practitioner

## 2018-02-03 DIAGNOSIS — G4700 Insomnia, unspecified: Secondary | ICD-10-CM | POA: Diagnosis present

## 2018-02-03 DIAGNOSIS — Z653 Problems related to other legal circumstances: Secondary | ICD-10-CM

## 2018-02-03 DIAGNOSIS — Z6372 Alcoholism and drug addiction in family: Secondary | ICD-10-CM

## 2018-02-03 DIAGNOSIS — Z6229 Other upbringing away from parents: Secondary | ICD-10-CM | POA: Diagnosis not present

## 2018-02-03 DIAGNOSIS — F410 Panic disorder [episodic paroxysmal anxiety] without agoraphobia: Secondary | ICD-10-CM

## 2018-02-03 DIAGNOSIS — Z915 Personal history of self-harm: Secondary | ICD-10-CM

## 2018-02-03 DIAGNOSIS — Z818 Family history of other mental and behavioral disorders: Secondary | ICD-10-CM | POA: Diagnosis not present

## 2018-02-03 DIAGNOSIS — R450 Nervousness: Secondary | ICD-10-CM | POA: Diagnosis not present

## 2018-02-03 DIAGNOSIS — Z888 Allergy status to other drugs, medicaments and biological substances status: Secondary | ICD-10-CM | POA: Diagnosis not present

## 2018-02-03 DIAGNOSIS — F419 Anxiety disorder, unspecified: Secondary | ICD-10-CM | POA: Diagnosis present

## 2018-02-03 DIAGNOSIS — F1920 Other psychoactive substance dependence, uncomplicated: Secondary | ICD-10-CM

## 2018-02-03 DIAGNOSIS — F17210 Nicotine dependence, cigarettes, uncomplicated: Secondary | ICD-10-CM | POA: Diagnosis present

## 2018-02-03 DIAGNOSIS — F1123 Opioid dependence with withdrawal: Secondary | ICD-10-CM | POA: Diagnosis present

## 2018-02-03 DIAGNOSIS — Z813 Family history of other psychoactive substance abuse and dependence: Secondary | ICD-10-CM | POA: Diagnosis not present

## 2018-02-03 DIAGNOSIS — F1120 Opioid dependence, uncomplicated: Secondary | ICD-10-CM

## 2018-02-03 DIAGNOSIS — F1410 Cocaine abuse, uncomplicated: Secondary | ICD-10-CM | POA: Diagnosis present

## 2018-02-03 DIAGNOSIS — F1510 Other stimulant abuse, uncomplicated: Secondary | ICD-10-CM | POA: Diagnosis present

## 2018-02-03 DIAGNOSIS — R45851 Suicidal ideations: Secondary | ICD-10-CM | POA: Diagnosis present

## 2018-02-03 DIAGNOSIS — Z62810 Personal history of physical and sexual abuse in childhood: Secondary | ICD-10-CM | POA: Diagnosis present

## 2018-02-03 DIAGNOSIS — Z9049 Acquired absence of other specified parts of digestive tract: Secondary | ICD-10-CM | POA: Diagnosis not present

## 2018-02-03 DIAGNOSIS — F1910 Other psychoactive substance abuse, uncomplicated: Secondary | ICD-10-CM | POA: Diagnosis not present

## 2018-02-03 DIAGNOSIS — F1210 Cannabis abuse, uncomplicated: Secondary | ICD-10-CM | POA: Diagnosis not present

## 2018-02-03 DIAGNOSIS — F1490 Cocaine use, unspecified, uncomplicated: Secondary | ICD-10-CM | POA: Diagnosis not present

## 2018-02-03 DIAGNOSIS — F1990 Other psychoactive substance use, unspecified, uncomplicated: Secondary | ICD-10-CM | POA: Diagnosis not present

## 2018-02-03 DIAGNOSIS — F332 Major depressive disorder, recurrent severe without psychotic features: Secondary | ICD-10-CM | POA: Diagnosis not present

## 2018-02-03 DIAGNOSIS — F1290 Cannabis use, unspecified, uncomplicated: Secondary | ICD-10-CM | POA: Diagnosis not present

## 2018-02-03 LAB — RAPID URINE DRUG SCREEN, HOSP PERFORMED
Amphetamines: POSITIVE — AB
BENZODIAZEPINES: NOT DETECTED
Barbiturates: NOT DETECTED
Cocaine: POSITIVE — AB
Opiates: POSITIVE — AB
Tetrahydrocannabinol: POSITIVE — AB

## 2018-02-03 LAB — SALICYLATE LEVEL

## 2018-02-03 LAB — COMPREHENSIVE METABOLIC PANEL
ALK PHOS: 51 U/L (ref 38–126)
ALT: 52 U/L (ref 14–54)
ANION GAP: 7 (ref 5–15)
AST: 29 U/L (ref 15–41)
Albumin: 4.2 g/dL (ref 3.5–5.0)
BUN: 12 mg/dL (ref 6–20)
CALCIUM: 8.8 mg/dL — AB (ref 8.9–10.3)
CHLORIDE: 100 mmol/L — AB (ref 101–111)
CO2: 29 mmol/L (ref 22–32)
Creatinine, Ser: 0.92 mg/dL (ref 0.44–1.00)
Glucose, Bld: 85 mg/dL (ref 65–99)
Potassium: 3.6 mmol/L (ref 3.5–5.1)
SODIUM: 136 mmol/L (ref 135–145)
Total Bilirubin: 0.6 mg/dL (ref 0.3–1.2)
Total Protein: 7.2 g/dL (ref 6.5–8.1)

## 2018-02-03 LAB — ACETAMINOPHEN LEVEL

## 2018-02-03 LAB — ETHANOL

## 2018-02-03 MED ORDER — TRAZODONE HCL 50 MG PO TABS
50.0000 mg | ORAL_TABLET | Freq: Every day | ORAL | Status: DC
  Administered 2018-02-03 – 2018-02-05 (×3): 50 mg via ORAL
  Filled 2018-02-03 (×5): qty 1

## 2018-02-03 MED ORDER — HYDROXYZINE HCL 25 MG PO TABS
25.0000 mg | ORAL_TABLET | Freq: Four times a day (QID) | ORAL | Status: DC | PRN
  Administered 2018-02-03 – 2018-02-04 (×3): 25 mg via ORAL
  Filled 2018-02-03 (×3): qty 1

## 2018-02-03 MED ORDER — NICOTINE 21 MG/24HR TD PT24
21.0000 mg | MEDICATED_PATCH | Freq: Every day | TRANSDERMAL | Status: DC
  Administered 2018-02-03: 21 mg via TRANSDERMAL
  Filled 2018-02-03 (×4): qty 1

## 2018-02-03 MED ORDER — NAPROXEN 500 MG PO TABS
500.0000 mg | ORAL_TABLET | Freq: Two times a day (BID) | ORAL | Status: DC | PRN
  Administered 2018-02-03 – 2018-02-05 (×2): 500 mg via ORAL
  Filled 2018-02-03: qty 1

## 2018-02-03 MED ORDER — RISPERIDONE 1 MG PO TABS
1.0000 mg | ORAL_TABLET | Freq: Every day | ORAL | Status: DC
  Administered 2018-02-03 – 2018-02-05 (×3): 1 mg via ORAL
  Filled 2018-02-03 (×5): qty 1

## 2018-02-03 MED ORDER — CLONIDINE HCL 0.1 MG PO TABS
0.1000 mg | ORAL_TABLET | ORAL | Status: DC
  Filled 2018-02-03 (×4): qty 1

## 2018-02-03 MED ORDER — MIRTAZAPINE 15 MG PO TABS
15.0000 mg | ORAL_TABLET | Freq: Every day | ORAL | Status: DC
  Administered 2018-02-03 – 2018-02-05 (×3): 15 mg via ORAL
  Filled 2018-02-03 (×6): qty 1

## 2018-02-03 MED ORDER — CLONIDINE HCL 0.1 MG PO TABS
0.1000 mg | ORAL_TABLET | Freq: Every day | ORAL | Status: DC
  Filled 2018-02-03: qty 1

## 2018-02-03 MED ORDER — CLONIDINE HCL 0.1 MG PO TABS
0.1000 mg | ORAL_TABLET | Freq: Four times a day (QID) | ORAL | Status: AC
  Administered 2018-02-03 – 2018-02-04 (×5): 0.1 mg via ORAL
  Filled 2018-02-03 (×10): qty 1

## 2018-02-03 MED ORDER — LOPERAMIDE HCL 2 MG PO CAPS: 2.0000 mg | ORAL_CAPSULE | ORAL | Status: DC | PRN

## 2018-02-03 MED ORDER — METHOCARBAMOL 500 MG PO TABS
500.0000 mg | ORAL_TABLET | Freq: Three times a day (TID) | ORAL | Status: DC | PRN
  Administered 2018-02-03: 500 mg via ORAL

## 2018-02-03 MED ORDER — ONDANSETRON 4 MG PO TBDP: 4.0000 mg | ORAL_TABLET | Freq: Four times a day (QID) | ORAL | Status: DC | PRN

## 2018-02-03 MED ORDER — ALUM & MAG HYDROXIDE-SIMETH 200-200-20 MG/5ML PO SUSP
30.0000 mL | ORAL | Status: DC | PRN
Start: 2018-02-03 — End: 2018-02-06
  Filled 2018-02-03: qty 30

## 2018-02-03 MED ORDER — GABAPENTIN 300 MG PO CAPS
300.0000 mg | ORAL_CAPSULE | Freq: Three times a day (TID) | ORAL | Status: DC
  Administered 2018-02-03 – 2018-02-05 (×7): 300 mg via ORAL
  Filled 2018-02-03 (×21): qty 1

## 2018-02-03 MED ORDER — CHLORDIAZEPOXIDE HCL 25 MG PO CAPS
25.0000 mg | ORAL_CAPSULE | Freq: Four times a day (QID) | ORAL | Status: DC | PRN
  Administered 2018-02-03: 25 mg via ORAL
  Filled 2018-02-03: qty 1

## 2018-02-03 MED ORDER — MAGNESIUM HYDROXIDE 400 MG/5ML PO SUSP
30.0000 mL | Freq: Every day | ORAL | Status: DC | PRN
Start: 2018-02-03 — End: 2018-02-06

## 2018-02-03 MED ORDER — ACETAMINOPHEN 325 MG PO TABS: 650.0000 mg | ORAL_TABLET | Freq: Four times a day (QID) | ORAL | Status: DC | PRN

## 2018-02-03 MED ORDER — NICOTINE 21 MG/24HR TD PT24
21.0000 mg | MEDICATED_PATCH | Freq: Every day | TRANSDERMAL | Status: DC
  Administered 2018-02-04 – 2018-02-05 (×2): 21 mg via TRANSDERMAL
  Filled 2018-02-03 (×4): qty 1

## 2018-02-03 MED ORDER — DICYCLOMINE HCL 20 MG PO TABS: 20.0000 mg | ORAL_TABLET | Freq: Four times a day (QID) | ORAL | Status: DC | PRN
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
## 2018-02-03 NOTE — ED Notes (Signed)
[id number] Expected date:  Expected time:  Means of arrival:  Comments: [place]

## 2018-02-03 NOTE — Progress Notes (Signed)
Patient ID: [name], female   DOB: 10-31-1991, 27 y.o.   MRN: [id number] Patient signed 72 hour notice for discharge on 02/03/2018 at 630pm.
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
## 2018-02-03 NOTE — ED Notes (Signed)
Pt presents with SI, plan to jump off parking deck.  Feeling hopeless  Denies HI or AVH.  Pt reports diagnosed with Bipolar DO.  A&O x 3, calm & cooperative, monitoring for safety, Q 15 min checks in effect, no distress noted.

## 2018-02-03 NOTE — [facility] Suicide Risk Assessment (Signed)
[facility]   Nursing information obtained from:    Demographic factors:    Current Mental Status:    Loss Factors:    Historical Factors:    Risk Reduction Factors:     Total Time spent with patient: 1 hour Principal Problem: Polysubstance dependence including opioid type drug without complication, episodic abuse (HCC) Diagnosis:   Patient Active Problem List   Diagnosis Date Noted  . Polysubstance (including opioids) dependence, daily use (HCC) [F19.20] 02/03/2018  . Polysubstance dependence including opioid type drug without complication, episodic abuse (HCC) [F19.20] 12/16/2017  . Major depressive disorder, recurrent severe without psychotic features (HCC) [F33.2] 12/16/2017  . Normal labor [O80, Z37.9] 10/16/2016  . Cigarette smoker [F17.210] 08/18/2016  . 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 Hepatitis C, chronic, maternal, antepartum (HCC) [O98.419, B18.2] 08/04/2016  . Supervision of high-risk pregnancy [O09.90] 07/21/2016  . History of substance abuse [Z87.898] 07/21/2016  . ASCUS with positive high risk HPV [IMO0002] 07/21/2016   Subjective Data:   [name] is a 27 y/o F with history of MDD and polysubstance abuse who was admitted with worsening depression, SI with plan to jump from a parking structure, and relapse of use of illicit substances. Pt [facility] care. Pt reported that she did continue to take her medications until samples were used up, but then she did not follow up. Pt shares that she had stressor of losing custody of her children which then caused increased anxiety and relapse of substance use. Pt had worsening depression and SI, and [facility] and [facility].   Upon initial evaluation, pt shares, "I don't feel good - my belly is jumping everywhere - I'm  detoxing." Pt shares about her recent history of events which lead up returning to the hospital, stating, "I lost custody of my two kids and then I just felt lost and started hanging out with the wrong people again, and I relapsed." Pt shares that she has been using heroin (insufflated), cocaine, and adderall. She endorses SI with plan to jump from a parking structure. She denies HI/AH/VH. She is sleeping adequately. She endorses depressed mood, guilty feelings, low energy, poor concentration, and fluctuant appetite. She denies symptoms of mania, OCD, and PTSD.  Discussed with patient about treatment options. [facility] at which time she was detoxified using clonidine and librium protocols, and she is in agreement to restart detox protocols on this admission. She would like to resume her previous medications of risperdal, gabapentin, remeron, and atarax. Pt is interested in speaking with SW team about potential substance use treatment options for after discharge. Pt was in agreement with the above plan, and she had no further questions, comments, or concerns.   Continued Clinical Symptoms:    The "Alcohol Use Disorders Identification Test", Guidelines for Use in Primary Care, Second Edition.  World Science writerHealth Organization [facility](WHO). Score between 0-7:  no or low risk or alcohol related problems. Score between 8-15:  moderate risk of alcohol related problems. Score between 16-19:  high risk of alcohol related problems. Score 20 or above:  warrants further diagnostic evaluation for alcohol dependence and treatment.   CLINICAL FACTORS:   Severe Anxiety and/or Agitation Depression:   Comorbid alcohol abuse/dependence Alcohol/Substance Abuse/Dependencies More than one psychiatric diagnosis Unstable or Poor Therapeutic Relationship Previous Psychiatric  Diagnoses and Treatments   Musculoskeletal: Strength & Muscle Tone: within normal limits Gait & Station:  normal Patient leans: N/A  Psychiatric Specialty Exam: Physical Exam  Nursing note and vitals reviewed.   Review of Systems  Constitutional: Negative for chills and fever.  Respiratory: Negative for cough and shortness of breath.   Cardiovascular: Negative for chest pain.  Gastrointestinal: Negative for abdominal pain, heartburn, nausea and vomiting.  Psychiatric/Behavioral: Positive for depression, substance abuse and suicidal ideas. Negative for hallucinations. The patient is nervous/anxious.     unknown if currently breastfeeding.There is no height or weight on file to calculate BMI.  General Appearance: Disheveled  Eye Contact:  Fair  Speech:  Clear and Coherent and Normal Rate  Volume:  Normal  Mood:  Anxious and Depressed  Affect:  Blunt  Thought Process:  Coherent and Goal Directed  Orientation:  Full (Time, Place, and Person)  Thought Content:  Logical  Suicidal Thoughts:  Yes.  with intent/plan  Homicidal Thoughts:  No  Memory:  Immediate;   Fair Recent;   Fair Remote;   Fair  Judgement:  Fair  Insight:  Fair  Psychomotor Activity:  Normal  Concentration:  Concentration: Fair  Recall:  [employer] of Knowledge:  Fair  Language:  Fair  Akathisia:  No  Handed:    AIMS (if indicated):     Assets:  Communication Skills Intimacy Leisure Time Physical Health Resilience  ADL's:  Intact  Cognition:  WNL  Sleep:       COGNITIVE FEATURES THAT CONTRIBUTE TO RISK:  None    SUICIDE RISK:   Minimal: No identifiable suicidal ideation.  Patients presenting with no risk factors but with morbid ruminations; may be classified as minimal risk based on the severity of the depressive symptoms  PLAN OF CARE:   - Admit to inpatient level of care  - MDD  - Restart remeron 15mg  po qhs   - Restart risperdal 1mg  po qhs  -Polysubstance abuse   - Start COWS with clonidine for opiate withdrawal  -Anxiety   - Restart gabapentin 300mg  po TID  - Start librium 25mg  po QID prn  severe anxiety   - Start atarax 26mg  po q6h prn mild anxiety  - Insomnia  - Start trazodone 50mg  po qhs prn insomnia  -Encourage participation in groups and the therapeutic milieu  -Discharge planning will be ongoing  I certify that inpatient services furnished can reasonably be expected to improve the patient's condition.   Micheal Likens, MD 02/03/2018, 2:22 PM
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
## 2018-02-03 NOTE — BHH Counselor (Signed)
Clinician [facility].    Redmond Pullingreylese D Zuriah Bordas, MS, [facility], [facility] Triage Specialist [phone]

## 2018-02-03 NOTE — BHH Group Notes (Signed)
LCSW Group Therapy Note  02/03/2018 1:15pm  Type of Therapy/Topic:  Group Therapy:  Emotion Regulation  Participation Level:  Did Not Attend-Pt chose to remain in bed.    Description of Group:   The purpose of this group is to assist patients in learning to regulate negative emotions and experience positive emotions. Patients will be guided to discuss ways in which they have been vulnerable to their negative emotions. These vulnerabilities will be juxtaposed with experiences of positive emotions or situations, and patients will be challenged to use positive emotions to combat negative ones. Special emphasis will be placed on coping with negative emotions in conflict situations, and patients will process healthy conflict resolution skills.  Therapeutic Goals: 1. Patient will identify two positive emotions or experiences to reflect on in order to balance out negative emotions 2. Patient will label two or more emotions that they find the most difficult to experience 3. Patient will demonstrate positive conflict resolution skills through discussion and/or role plays  Summary of Patient Progress:   x    Therapeutic Modalities:   Cognitive Behavioral Therapy Feelings Identification Dialectical Behavioral Therapy   Ledell PeoplesHeather N Smart, LCSW 02/03/2018 2:36 PM

## 2018-02-03 NOTE — ED Notes (Signed)
[facility]  Nurse unavailable.  Told to call back in 15 min.

## 2018-02-03 NOTE — Progress Notes (Signed)
D   Pt is pleasant on approach and cooperative   She contracts for safety   She is visible on the milieu and interacts appropriately with others   She is eating and drinking well and has been encouraged to drink extra fluids since her blood pressure has been running a little low A   Verbal support given   Medications administered and effectiveness monitored   Q 15 min checks R   Pt remains safe and receptive to verbal support

## 2018-02-03 NOTE — BHH Counselor (Signed)
Clinician contacted Melvenia BeamShari, [place] and discussed pt has [facility] with the recommendation of inpatient. clinician expressed to [place], [place] [facility].  Per Delorise Jacksonori, [facility], assigned to [facility], after 0730. Attending physician: Dr. Jama Flavorsobos. Nursing report: [phone].   Clinician attempted to contact pt's nurse to discuss [facility], there was no answer.    Redmond Pullingreylese D Fumiye Lubben, MS, [facility], [facility] Triage Specialist [phone]

## 2018-02-03 NOTE — ED Notes (Signed)
[facility]

## 2018-02-04 DIAGNOSIS — F1410 Cocaine abuse, uncomplicated: Secondary | ICD-10-CM

## 2018-02-04 DIAGNOSIS — F1210 Cannabis abuse, uncomplicated: Secondary | ICD-10-CM

## 2018-02-04 DIAGNOSIS — F1910 Other psychoactive substance abuse, uncomplicated: Secondary | ICD-10-CM

## 2018-02-04 DIAGNOSIS — R45851 Suicidal ideations: Secondary | ICD-10-CM

## 2018-02-04 DIAGNOSIS — F17210 Nicotine dependence, cigarettes, uncomplicated: Secondary | ICD-10-CM

## 2018-02-04 NOTE — BHH Group Notes (Signed)
LCSW Group Therapy Note 02/04/2018 2:51 PM  Type of Therapy and Topic: Group Therapy: Feelings around Relapse and Recovery  Participation Level: Active   Description of Group:  Patients in this group will discuss emotions they experience before and after a relapse. They will process how experiencing these feelings, or avoidance of experiencing them, relates to having a relapse. Facilitator will guide patients to explore emotions they have related to recovery. Patients will be encouraged to process which emotions are more powerful. They will be guided to discuss the emotional reaction significant others in their lives may have to their relapse or recovery. Patients will be assisted in exploring ways to respond to the emotions of others without this contributing to a relapse.  Therapeutic Goals: 1. Patient will identify two or more emotions that lead to a relapse for them 2. Patient will identify two emotions that result when they relapse 3. Patient will identify two emotions related to recovery 4. Patient will demonstrate ability to communicate their needs through discussion and/or role plays  Summary of Patient Progress:  [name] was engaged throughout the group session. She participated and contributed to the group's discussion. [name] stated that she plans to get her kids back and work on better coping skills to prevent herself from relapsing after being discharged.     Therapeutic Modalities:  Cognitive Behavioral Therapy Solution-Focused Therapy Assertiveness Training Relapse Prevention Therapy   Alcario DroughtJolan Renalda Locklin LCSWA Clinical Social Worker

## 2018-02-04 NOTE — Progress Notes (Signed)
Recreation Therapy Notes  Date: 02/04/18 Time: 0930 Location: 300 Hall Dayroom  Group Topic: Stress Management  Goal Area(s) Addresses:  Patient will verbalize importance of using healthy stress management.  Patient will identify positive emotions associated with healthy stress management.   Intervention: Stress Management  Activity :  Progressive Muscle Relaxation.  LRT introduced the stress management technique of progressive muscle relaxation.  Patients were to follow along as LRT read script to tense each muscle group and then relax them separately.   Education:  Stress Management, Discharge Planning.   Education Outcome: Acknowledges edcuation/In group clarification offered/Needs additional education  Clinical Observations/Feedback: Pt did not attend group.     Ovie Eastep, LRT/CTRS        [name] A 02/04/2018 12:14 PM 

## 2018-02-04 NOTE — Progress Notes (Signed)
D: Patient is a new admit to unit, as of yesterday.  Her goal is to get help with her "drug problem."  She has been in bed the majority of the morning, only getting out for her nicotine patch.  Patient's BP is running low and her clonidine was held due to same.  She denies any thoughts of self harm.  Patient presents with flat, blunted affect and depressed mood.  A: Continue to monitor medication management and MD orders.  Safety checks completed every 15 minutes per protocol.  Offer support and encouragement as needed.    R: Patient is isolative to room; she has minimal interaction with staff and her behavior has been appropriate.

## 2018-02-04 NOTE — ED Provider Notes (Signed)
[facility]-EMERGENCY DEPT Provider Note   CSN: [id number] Arrival date & time: 02/02/18  2240     History   Chief Complaint Chief Complaint  Patient presents with  . Suicidal    HPI [name] is a 27 y.o. female.  HPI  27 y.o. female who presents to the ER with cc of suicidal thoughts.  Patient states that she has history of polysubstance abuse, depression, and she has not been taking her medications as prescribed.  Patient also admits to polysubstance abuse.    Pt denies nausea, emesis, fevers, chills, chest pains, shortness of breath, headaches, abdominal pain, uti like symptoms.    Past Medical History:  Diagnosis Date  . History of RPR test 07/21/2016   02/2016: 1:1 with negative TPA testing [ ]  f/u 28wks [ ]  f/u admit RPR [ ]  f/u PP RPR  . History of self-harm   . Polysubstance abuse [facility])     Patient Active Problem List   Diagnosis Date Noted  . Polysubstance (including opioids) dependence, daily use (HCC) 02/03/2018  . Polysubstance dependence including opioid type drug without complication, episodic abuse (HCC) 12/16/2017  . 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 Normal labor 10/16/2016  . Cigarette smoker 08/18/2016  . Pregnancy complicated by subutex maintenance, antepartum (HCC) 08/04/2016  . Hepatitis C, chronic, maternal, antepartum (HCC) 08/04/2016  . Supervision of high-risk pregnancy 07/21/2016  . History of substance abuse 07/21/2016  . ASCUS with positive high risk HPV 07/21/2016    Past Surgical History:  Procedure Laterality Date  . TONSILLECTOMY      OB History    Gravida Para Term Preterm AB Living   2 2 2     2    SAB TAB Ectopic Multiple Live Births         0 2       Home Medications    Prior to Admission medications   Medication Sig Start Date End Date Taking? Authorizing Provider  gabapentin (NEURONTIN) 300 MG capsule Take 1 capsule (300 mg total) by  mouth 4 (four) times daily - after meals and at bedtime. For agitation 12/20/17  Yes Armandina Stammer I, NP  hydrOXYzine (ATARAX/VISTARIL) 25 MG tablet Take 1 tablet (25 mg total) by mouth every 6 (six) hours as needed for anxiety. 12/20/17  Yes Armandina Stammer I, NP  mirtazapine (REMERON) 15 MG tablet Take 1 tablet (15 mg total) by mouth at bedtime. For depression/insomnia 12/20/17  Yes Nwoko, Nicole Kindred I, NP  risperiDONE (RISPERDAL) 1 MG tablet Take 1 tablet (1 mg total) by mouth at bedtime. For mood control 12/20/17  Yes Armandina Stammer I, NP  acetaminophen (TYLENOL) 500 MG tablet Take 1 tablet (500 mg total) by mouth every 6 (six) hours as needed. Patient not taking: Reported on 02/02/2018 12/20/17   Armandina Stammer I, NP  nicotine (NICODERM CQ - DOSED IN MG/24 HOURS) 21 mg/24hr patch Place 1 patch (21 mg total) onto the skin daily. (May from over the counter): For smoking cessation Patient not taking: Reported on 02/02/2018 12/21/17   Sanjuana Kava, NP    Family History Family History  Problem Relation Age of Onset  . Vision loss Mother   . Depression Mother   . Ulcers Mother   . Gallbladder disease Mother   . Mental illness Mother   . Thyroid disease Mother   . Allergies Father   . Vision loss Father   . Vision loss Maternal Grandmother   .  Ulcers Maternal Grandmother   . Gallbladder disease Maternal Grandmother   . Diabetes Maternal Grandmother   . Vision loss Maternal Grandfather   . Heart disease Maternal Grandfather   . Cancer Maternal Grandfather   . Allergies Paternal Grandmother   . Vision loss Paternal Grandmother   . Gallbladder disease Paternal Grandmother   . Arthritis Paternal Grandmother   . Cancer Paternal Grandmother   . Allergies Paternal Grandfather   . Vision loss Paternal Grandfather     Social History Social History   Tobacco Use  . Smoking status: Current Every Day Smoker    Packs/day: 0.00    Types: Cigarettes  . Smokeless tobacco: Never Used  Substance Use Topics   . Alcohol use: No  . Drug use: Yes    Frequency: 5.0 times per week    Types: Cocaine, Marijuana, Methamphetamines    Comment: Heroin     Allergies   Dextromethorphan   Review of Systems Review of Systems  All other systems reviewed and are negative.    Physical Exam Updated Vital Signs BP (!) 95/51 (BP Location: Left Arm)   Pulse 74   Temp 98.1 F (36.7 C) (Oral)   Resp 18   Ht 5' (1.524 m)   Wt 45.4 kg (100 lb)   SpO2 100%   BMI 19.53 kg/m   Physical Exam  Constitutional: She is oriented to person, place, and time. She appears well-developed.  HENT:  Head: Normocephalic and atraumatic.  Eyes: EOM are normal.  Neck: Normal range of motion. Neck supple.  Cardiovascular: Normal rate.  Pulmonary/Chest: Effort normal.  Abdominal: Bowel sounds are normal.  Neurological: She is alert and oriented to person, place, and time.  Skin: Skin is warm and dry.  Nursing note and vitals reviewed.    ED Treatments / Results  Labs (all labs ordered are listed, but only abnormal results are displayed) Labs Reviewed  COMPREHENSIVE METABOLIC PANEL - Abnormal; Notable for the following components:      Result Value   Chloride 100 (*)    Calcium 8.8 (*)    All other components within normal limits  ACETAMINOPHEN LEVEL - Abnormal; Notable for the following components:   Acetaminophen (Tylenol), Serum <10 (*)    All other components within normal limits  CBC - Abnormal; Notable for the following components:   WBC 10.8 (*)    All other components within normal limits  RAPID URINE DRUG SCREEN, HOSP PERFORMED - Abnormal; Notable for the following components:   Opiates POSITIVE (*)    Cocaine POSITIVE (*)    Amphetamines POSITIVE (*)    Tetrahydrocannabinol POSITIVE (*)    All other components within normal limits  ETHANOL  SALICYLATE LEVEL  I-STAT BETA HCG BLOOD, ED ([facility], [facility], [facility] ONLY)    EKG  EKG Interpretation None       Radiology No results  found.  Procedures Procedures (including critical care time)  Medications Ordered in ED Medications - No data to display   Initial Impression / Assessment and Plan / ED Course  I have reviewed the triage vital signs and the nursing notes.  Pertinent labs & imaging results that were available during my care of the patient were reviewed by me and considered in my medical decision making (see chart for details).     Patient comes in with chief complaint of suicidal ideation.  Patient is medically cleared for psych evaluation.  Patient has polysubstance abuse hx as well.  Final Clinical Impressions(s) / ED  Diagnoses   Final diagnoses:  Polysubstance dependence including opioid type drug without complication, episodic abuse (HCC)  Major depressive disorder, recurrent severe without psychotic features [facility](HCC)    ED Discharge Orders    None       Derwood KaplanNanavati, Ziyanna Tolin, MD 02/04/18 1051

## 2018-02-04 NOTE — Progress Notes (Signed)
Adult Psychoeducational Group Note  Date:  02/04/2018 Time:  6:55 PM  Group Topic/Focus:  Relapse Prevention Planning:   The focus of this group is to define relapse and discuss the need for planning to combat relapse.  Participation Level:  Did Not Attend  Participation Quality:  n/a  Affect:  n/a  Cognitive:  n/a  Insight: None  Engagement in Group:  did not attend  Modes of Intervention:  not present  Additional Comments:  n/a  Delila PereyraMichels, Myrta Mercer Louise 02/04/2018, 6:55 PM

## 2018-02-04 NOTE — Progress Notes (Signed)
D   Pt is pleasant on approach and cooperative   She contracts for safety   She is visible on the milieu and interacts appropriately with others   She is eating and drinking well and has been encouraged to drink extra fluids since her blood pressure has been running a little low  Pt did not take  The clonidine and said she wasn't having any withdrawal symptoms A   Verbal support given   Medications administered and effectiveness monitored   Q 15 min checks R   Pt remains safe and receptive to verbal support

## 2018-02-04 NOTE — Progress Notes (Signed)
[facility] MD Progress Note  02/04/2018 2:12 PM [name]  MRN:  [id number]  Subjective: [name] reports, "I feel a little better today. The withdrawal symptoms are better today. I slept well last night. My appetite is good. I'm thinking about going to the [facility] after discharge".  Objective: [name] is a 27 y/o F with history of MDD and polysubstance abuse who was admitted with worsening depression, SI with plan to jump from a parking structure, and relapse of use of illicit substances. Pt has recent relevant history of discharge from [facility] on 12/20/17 to outpatient level of care. Pt reported that she did continue to take her medications until samples were used up, but then she did not follow up. Pt shares that she had stressor of losing custody of her children which then caused increased anxiety and relapse of substance use. Pt had worsening depression and SI, and she brought herself to [facility] for treatment of her mood symptoms and detox 02-04-18,  [name] is seen, chart reviewed. The chart findings discussed with the treatment team. She is alert, oriented x 4. She is visible on the unit. She is attending group sessions. She reports improved mood & symptoms. She denies any new issues. She is contemplating on enrolling in the [facility] treatment center after discharge. He denies any SIHI, AVH, delusional thoughts or paranoia. She is taking & tolerating her treatment regimen. She denies any side effects. She does not appear to be responding to any internal stimuli. Update: Shortly after the above notes were written, the attending psychiatrist called & reports that the SW stated that [name] has been accepted at the [facility] to continue further substance abuse treatment after discharge. However, [name] had already signed a 72 hour release form to be discharged which is due tomorrow morning. She was approached by the attending psychiatrist to cancel this 72 hour  discharge request so to take advantage of the [facility] enrollment, [name] declined & insisted on getting discharged in the morning of 02-05-18.  Principal Problem: Major depressive disorder, recurrent severe without psychotic features ([place])  Diagnosis:   Patient Active Problem List   Diagnosis Date Noted  . Polysubstance dependence including opioid type drug without complication, episodic abuse ([place]) [F19.20] 12/16/2017    Priority: High  . Polysubstance (including opioids) dependence, daily use ([place]) [F19.20] 02/03/2018  . Major depressive disorder, recurrent severe without psychotic features ([place]) [F33.2] 12/16/2017  . Normal labor [O80, Z37.9] 10/16/2016  . Cigarette smoker [F17.210] 08/18/2016  . Pregnancy complicated by subutex maintenance, antepartum ([place]) [O99.320, F11.20] 08/04/2016  . Hepatitis C, chronic, maternal, antepartum ([place]) [O98.419, B18.2] 08/04/2016  . Supervision of high-risk pregnancy [O09.90] 07/21/2016  . History of substance abuse [Z87.898] 07/21/2016  . ASCUS with positive high risk HPV [IMO0002] 07/21/2016   Total Time spent with patient: 25 minutes  Past Psychiatric History: See H&P  Past Medical History:  Past Medical History:  Diagnosis Date  . History of RPR test 07/21/2016   02/2016: 1:1 with negative TPA testing '[ ]'  f/u 28wks '[ ]'  f/u admit RPR '[ ]'  f/u PP RPR  . History of self-harm   . Polysubstance abuse [facility])     Past Surgical History:  Procedure Laterality Date  . TONSILLECTOMY     Family History:  Family History  Problem Relation Age of Onset  . Vision loss Mother   . Depression Mother   . Ulcers Mother   . Gallbladder disease Mother   . Mental illness Mother   .  Thyroid disease Mother   . Allergies Father   . Vision loss Father   . Vision loss Maternal Grandmother   . Ulcers Maternal Grandmother   . Gallbladder disease Maternal Grandmother   . Diabetes Maternal Grandmother   . Vision loss Maternal Grandfather   . Heart disease  Maternal Grandfather   . Cancer Maternal Grandfather   . Allergies Paternal Grandmother   . Vision loss Paternal Grandmother   . Gallbladder disease Paternal Grandmother   . Arthritis Paternal Grandmother   . Cancer Paternal Grandmother   . Allergies Paternal Grandfather   . Vision loss Paternal Grandfather    Family Psychiatric  History: See H&P  Social History:  Social History   Substance and Sexual Activity  Alcohol Use No     Social History   Substance and Sexual Activity  Drug Use Yes  . Frequency: 5.0 times per week  . Types: Cocaine, Marijuana, Methamphetamines   Comment: Heroin    Social History   Socioeconomic History  . Marital status: Single    Spouse name: None  . Number of children: None  . Years of education: None  . Highest education level: None  Social Needs  . Financial resource strain: None  . Food insecurity - worry: None  . Food insecurity - inability: None  . Transportation needs - medical: None  . Transportation needs - non-medical: None  Occupational History  . None  Tobacco Use  . Smoking status: Current Every Day Smoker    Packs/day: 0.00    Types: Cigarettes  . Smokeless tobacco: Never Used  Substance and Sexual Activity  . Alcohol use: No  . Drug use: Yes    Frequency: 5.0 times per week    Types: Cocaine, Marijuana, Methamphetamines    Comment: Heroin  . Sexual activity: Yes    Partners: Male  Other Topics Concern  . None  Social History Narrative  . None   Additional Social History:   Sleep: Good  Appetite:  Good  Current Medications: Current Facility-Administered Medications  Medication Dose Route Frequency Provider Last Rate Last Dose  . acetaminophen (TYLENOL) tablet 650 mg  650 mg Oral Q6H PRN Secily Walthour I, NP      . alum & mag hydroxide-simeth (MAALOX/MYLANTA) 200-200-20 MG/5ML suspension 30 mL  30 mL Oral Q4H PRN Rajah Tagliaferro I, NP      . chlordiazePOXIDE (LIBRIUM) capsule 25 mg  25 mg Oral QID PRN Lindell Spar I, NP   25 mg at 02/03/18 1845  . cloNIDine (CATAPRES) tablet 0.1 mg  0.1 mg Oral QID Lindell Spar I, NP   0.1 mg at 02/04/18 1151   Followed by  . [START ON 02/05/2018] cloNIDine (CATAPRES) tablet 0.1 mg  0.1 mg Oral BH-qamhs Ciel Chervenak I, NP       Followed by  . [START ON 02/08/2018] cloNIDine (CATAPRES) tablet 0.1 mg  0.1 mg Oral QAC breakfast Nike Southwell I, NP      . dicyclomine (BENTYL) tablet 20 mg  20 mg Oral Q6H PRN Serita Degroote I, NP      . gabapentin (NEURONTIN) capsule 300 mg  300 mg Oral TID PC & HS Patrecia Pour, NP   300 mg at 02/04/18 1151  . hydrOXYzine (ATARAX/VISTARIL) tablet 25 mg  25 mg Oral Q6H PRN Lindell Spar I, NP   25 mg at 02/03/18 2129  . loperamide (IMODIUM) capsule 2-4 mg  2-4 mg Oral PRN Encarnacion Slates, NP      .  magnesium hydroxide (MILK OF MAGNESIA) suspension 30 mL  30 mL Oral Daily PRN Kaiser Belluomini I, NP      . methocarbamol (ROBAXIN) tablet 500 mg  500 mg Oral Q8H PRN Lindell Spar I, NP   500 mg at 02/03/18 1223  . mirtazapine (REMERON) tablet 15 mg  15 mg Oral QHS Patrecia Pour, NP   15 mg at 02/03/18 2128  . naproxen (NAPROSYN) tablet 500 mg  500 mg Oral BID PRN Lindell Spar I, NP   500 mg at 02/03/18 1223  . nicotine (NICODERM CQ - dosed in mg/24 hours) patch 21 mg  21 mg Transdermal Q0600 Cobos, Myer Peer, MD   21 mg at 02/04/18 0839  . ondansetron (ZOFRAN-ODT) disintegrating tablet 4 mg  4 mg Oral Q6H PRN Jaidin Richison I, NP      . risperiDONE (RISPERDAL) tablet 1 mg  1 mg Oral QHS Patrecia Pour, NP   1 mg at 02/03/18 2128  . traZODone (DESYREL) tablet 50 mg  50 mg Oral QHS Lindell Spar I, NP   50 mg at 02/03/18 2128   Lab Results:  Results for orders placed or performed during the hospital encounter of 02/02/18 (from the past 48 hour(s))  Rapid urine drug screen (hospital performed)     Status: Abnormal   Collection Time: 02/02/18 11:13 PM  Result Value Ref Range   Opiates POSITIVE (A) NONE DETECTED   Cocaine POSITIVE (A) NONE DETECTED    Benzodiazepines NONE DETECTED NONE DETECTED   Amphetamines POSITIVE (A) NONE DETECTED   Tetrahydrocannabinol POSITIVE (A) NONE DETECTED   Barbiturates NONE DETECTED NONE DETECTED    Comment: (NOTE) DRUG SCREEN FOR MEDICAL PURPOSES ONLY.  IF CONFIRMATION IS NEEDED FOR ANY PURPOSE, NOTIFY LAB WITHIN 5 DAYS. LOWEST DETECTABLE LIMITS FOR URINE DRUG SCREEN Drug Class                     Cutoff (ng/mL) Amphetamine and metabolites    1000 Barbiturate and metabolites    200 Benzodiazepine                 329 Tricyclics and metabolites     300 Opiates and metabolites        300 Cocaine and metabolites        300 THC                            50 Performed at [facility], [place] [address], [place],  [address]   Comprehensive metabolic panel     Status: Abnormal   Collection Time: 02/02/18 11:27 PM  Result Value Ref Range   Sodium 136 135 - 145 mmol/L   Potassium 3.6 3.5 - 5.1 mmol/L   Chloride 100 (L) 101 - 111 mmol/L   CO2 29 22 - 32 mmol/L   Glucose, Bld 85 65 - 99 mg/dL   BUN 12 6 - 20 mg/dL   Creatinine, Ser 0.92 0.44 - 1.00 mg/dL   Calcium 8.8 (L) 8.9 - 10.3 mg/dL   Total Protein 7.2 6.5 - 8.1 g/dL   Albumin 4.2 3.5 - 5.0 g/dL   AST 29 15 - 41 U/L   ALT 52 14 - 54 U/L   Alkaline Phosphatase 51 38 - 126 U/L   Total Bilirubin 0.6 0.3 - 1.2 mg/dL   GFR calc non Af Amer >60 >60 mL/min   GFR calc Af Amer >60 >60 mL/min  Comment: (NOTE) The eGFR has been calculated using the CKD EPI equation. This calculation has not been validated in all clinical situations. eGFR's persistently <60 mL/min signify possible Chronic Kidney Disease.    Anion gap 7 5 - 15    Comment: Performed at [facility], [place] [address] [place], [place] [address]  Ethanol     Status: None   Collection Time: 02/02/18 11:27 PM  Result Value Ref Range   Alcohol, Ethyl (B) <10 <10 mg/dL    Comment:        LOWEST DETECTABLE LIMIT FOR SERUM ALCOHOL IS 10 mg/dL FOR  MEDICAL PURPOSES ONLY Performed at [facility], [place] [address], [place], [place] [address]   Salicylate level     Status: None   Collection Time: 02/02/18 11:27 PM  Result Value Ref Range   Salicylate Lvl <6.4 2.8 - 30.0 mg/dL    Comment: Performed at [facility], [place] [address], [place], [place] [address]  Acetaminophen level     Status: Abnormal   Collection Time: 02/02/18 11:27 PM  Result Value Ref Range   Acetaminophen (Tylenol), Serum <10 (L) 10 - 30 ug/mL    Comment:        THERAPEUTIC CONCENTRATIONS VARY SIGNIFICANTLY. A RANGE OF 10-30 ug/mL MAY BE AN EFFECTIVE CONCENTRATION FOR MANY PATIENTS. HOWEVER, SOME ARE BEST TREATED AT CONCENTRATIONS OUTSIDE THIS RANGE. ACETAMINOPHEN CONCENTRATIONS >150 ug/mL AT 4 HOURS AFTER INGESTION AND >50 ug/mL AT 12 HOURS AFTER INGESTION ARE OFTEN ASSOCIATED WITH TOXIC REACTIONS. Performed at [facility], [place] [address], [place], [place] [address]   cbc     Status: Abnormal   Collection Time: 02/02/18 11:27 PM  Result Value Ref Range   WBC 10.8 (H) 4.0 - 10.5 K/uL   RBC 4.22 3.87 - 5.11 MIL/uL   Hemoglobin 13.0 12.0 - 15.0 g/dL   HCT 39.7 36.0 - 46.0 %   MCV 94.1 78.0 - 100.0 fL   MCH 30.8 26.0 - 34.0 pg   MCHC 32.7 30.0 - 36.0 g/dL   RDW 13.6 11.5 - 15.5 %   Platelets 342 150 - 400 K/uL    Comment: Performed at [facility], [place] [address] [place], [place] [address]  I-Stat beta hCG blood, ED     Status: None   Collection Time: 02/02/18 11:39 PM  Result Value Ref Range   I-stat hCG, quantitative <5.0 <5 mIU/mL   Comment 3            Comment:   GEST. AGE      CONC.  (mIU/mL)   <=1 WEEK        5 - 50     2 WEEKS       50 - 500     3 WEEKS       100 - 10,000     4 WEEKS     1,000 - 30,000        FEMALE AND NON-PREGNANT FEMALE:     LESS THAN 5 mIU/mL    Blood Alcohol level:  Lab Results  Component Value Date   ETH <10 02/02/2018   ETH <10  64/33/2951   Metabolic Disorder Labs: No results found for: HGBA1C, MPG No results found for: PROLACTIN No results found for: CHOL, TRIG, HDL, CHOLHDL, VLDL, LDLCALC  Physical Findings: AIMS:  , ,  ,  ,    CIWA:    COWS:  COWS Total Score: 7  Musculoskeletal:  Strength & Muscle Tone: within normal limits Gait & Station: normal Patient leans: N/A  Psychiatric Specialty Exam: Physical Exam  Nursing note and vitals reviewed.   Review of Systems  Constitutional: Diaphoresis: "Improving" Malaise/fatigue: "Improving.  Psychiatric/Behavioral: Positive for depression ("Improving") and substance abuse (Hx. polysubstance use disorder). Negative for hallucinations, memory loss and suicidal ideas. The patient is nervous/anxious ("Improving") and has insomnia ("Improving").     Blood pressure (!) 86/50, pulse 87, temperature (!) 97.3 F (36.3 C), temperature source Oral, resp. rate 16, height 5' (1.524 m), weight 44.5 kg (98 lb), SpO2 98 %, unknown if currently breastfeeding.Body mass index is 19.14 kg/m.  General Appearance: Disheveled  Eye Contact:  Fair  Speech:  Clear and Coherent and Normal Rate  Volume:  Normal  Mood:  Anxious and Depressed  Affect:  Blunt  Thought Process:  Coherent and Goal Directed  Orientation:  Full (Time, Place, and Person)  Thought Content:  Logical  Suicidal Thoughts:  Yes.  with intent/plan  Homicidal Thoughts:  No  Memory:  Immediate;   Fair Recent;   Fair Remote;   Fair  Judgement:  Fair  Insight:  Fair  Psychomotor Activity:  Normal  Concentration:  Concentration: Fair  Recall:  [employer] of Knowledge:  Fair  Language:  Fair  Akathisia:  No  Handed:    AIMS (if indicated):     Assets:  Communication Skills Intimacy Leisure Time Physical Health Resilience  ADL's:  Intact  Cognition:  WNL  Sleep: 5: 25     Treatment Plan Summary: Daily contact with patient to assess and evaluate symptoms and progress in treatment: Tevis reports  improved symptoms. She is currently contemplating enrolling in the [facility] treatment program after discharge. Denies any new issues.     - Continue inpatient hospitalization.    - Will continue today 02/04/2018 plan as below except where it is noted.  Opioid detox.    - Continue the Clonidine detox protocols already in progress.  Agitation/substance withdrawal syndrome.    - Continue Gabapentin 300 mg po qid.  Depression/Insomnia.    - Continue Mirtazapine 15 mg po Q hs.  Mood control.    - Continue Risperdal 1 mg po Q hs.  Insomnia.    - Continue Trazodone 50 mg po Q hs.      - Patient to continue to attend & participate in the group counseling sessions.      - We are working on the discharge disposition plan.  Lindell Spar, NP, PMHNP, FNP-BC. 02/04/2018, 2:12 PM

## 2018-02-04 NOTE — Progress Notes (Signed)
[facility] Group Notes:  (Nursing/MHT/Case Management/Adjunct)  Date:  02/03/2018  Time:  2050 Type of Therapy:  wrap up group  Participation Level:  Minimal  Participation Quality:  Appropriate and Drowsy  Affect:  Flat  Cognitive:  Appropriate  Insight:  Lacking  Engagement in Group:  Supportive  Modes of Intervention:  Clarification, Education and Support  Summary of Progress/Problems:  [name], [name] 02/04/2018, 12:40 AM

## 2018-02-04 NOTE — [facility] Suicide Risk Assessment (Signed)
[facility] INPATIENT:  Family/Significant Other Suicide Prevention Education  Suicide Prevention Education:  Patient Refusal for Family/Significant Other Suicide Prevention Education: The patient [name] has refused to provide written consent for family/significant other to be provided Family/Significant Other Suicide Prevention Education during admission and/or prior to discharge.  Physician notified.  SPE completed with pt, as pt refused to consent to family contact. SPI pamphlet provided to pt and pt was encouraged to share information with support network, ask questions, and talk about any concerns relating to SPE. Pt denies access to guns/firearms and verbalized understanding of information provided. Mobile Crisis information also provided to pt.   Dailon Sheeran N Smart LCSW 02/04/2018, 3:22 PM

## 2018-02-04 NOTE — Tx Team (Signed)
Interdisciplinary Treatment and Diagnostic Plan Update  02/04/2018 Time of Session: 0830AM [name] MRN: [id number]  Principal Diagnosis: Major depressive disorder, recurrent severe without psychotic features [facility])  Secondary Diagnoses: Principal Problem:   Major depressive disorder, recurrent severe without psychotic features (HCC) Active Problems:   Polysubstance dependence including opioid type drug without complication, episodic abuse (HCC)   Current Medications:  Current Facility-Administered Medications  Medication Dose Route Frequency Provider Last Rate Last Dose  . acetaminophen (TYLENOL) tablet 650 mg  650 mg Oral Q6H PRN Nwoko, Agnes I, NP      . alum & mag hydroxide-simeth (MAALOX/MYLANTA) 200-200-20 MG/5ML suspension 30 mL  30 mL Oral Q4H PRN Nwoko, Agnes I, NP      . chlordiazePOXIDE (LIBRIUM) capsule 25 mg  25 mg Oral QID PRN Armandina Stammer I, NP   25 mg at 02/03/18 1845  . cloNIDine (CATAPRES) tablet 0.1 mg  0.1 mg Oral QID Armandina Stammer I, NP   0.1 mg at 02/03/18 2143   Followed by  . [START ON 02/05/2018] cloNIDine (CATAPRES) tablet 0.1 mg  0.1 mg Oral BH-qamhs Nwoko, Agnes I, NP       Followed by  . [START ON 02/08/2018] cloNIDine (CATAPRES) tablet 0.1 mg  0.1 mg Oral QAC breakfast Nwoko, Agnes I, NP      . dicyclomine (BENTYL) tablet 20 mg  20 mg Oral Q6H PRN Nwoko, Agnes I, NP      . gabapentin (NEURONTIN) capsule 300 mg  300 mg Oral TID PC & HS Charm Rings, NP   300 mg at 02/04/18 0840  . hydrOXYzine (ATARAX/VISTARIL) tablet 25 mg  25 mg Oral Q6H PRN Armandina Stammer I, NP   25 mg at 02/03/18 2129  . loperamide (IMODIUM) capsule 2-4 mg  2-4 mg Oral PRN Nwoko, Agnes I, NP      . magnesium hydroxide (MILK OF MAGNESIA) suspension 30 mL  30 mL Oral Daily PRN Nwoko, Agnes I, NP      . methocarbamol (ROBAXIN) tablet 500 mg  500 mg Oral Q8H PRN Armandina Stammer I, NP   500 mg at 02/03/18 1223  . mirtazapine (REMERON) tablet 15 mg  15 mg Oral QHS Charm Rings, NP   15 mg at  02/03/18 2128  . naproxen (NAPROSYN) tablet 500 mg  500 mg Oral BID PRN Armandina Stammer I, NP   500 mg at 02/03/18 1223  . nicotine (NICODERM CQ - dosed in mg/24 hours) patch 21 mg  21 mg Transdermal Q0600 Cobos, Rockey Situ, MD   21 mg at 02/04/18 0839  . ondansetron (ZOFRAN-ODT) disintegrating tablet 4 mg  4 mg Oral Q6H PRN Nwoko, Agnes I, NP      . risperiDONE (RISPERDAL) tablet 1 mg  1 mg Oral QHS Charm Rings, NP   1 mg at 02/03/18 2128  . traZODone (DESYREL) tablet 50 mg  50 mg Oral QHS Armandina Stammer I, NP   50 mg at 02/03/18 2128   PTA Medications: Medications Prior to Admission  Medication Sig Dispense Refill Last Dose  . acetaminophen (TYLENOL) 500 MG tablet Take 1 tablet (500 mg total) by mouth every 6 (six) hours as needed. (Patient not taking: Reported on 02/02/2018) 1 tablet 0 Not Taking at Unknown time  . gabapentin (NEURONTIN) 300 MG capsule Take 1 capsule (300 mg total) by mouth 4 (four) times daily - after meals and at bedtime. For agitation 120 capsule 0 Past Month at Unknown time  . hydrOXYzine (ATARAX/VISTARIL)  25 MG tablet Take 1 tablet (25 mg total) by mouth every 6 (six) hours as needed for anxiety. 60 tablet 0 Past Month at Unknown time  . mirtazapine (REMERON) 15 MG tablet Take 1 tablet (15 mg total) by mouth at bedtime. For depression/insomnia 30 tablet 0 Past Month at Unknown time  . nicotine (NICODERM CQ - DOSED IN MG/24 HOURS) 21 mg/24hr patch Place 1 patch (21 mg total) onto the skin daily. (May from over the counter): For smoking cessation (Patient not taking: Reported on 02/02/2018) 28 patch 0 Not Taking at Unknown time  . risperiDONE (RISPERDAL) 1 MG tablet Take 1 tablet (1 mg total) by mouth at bedtime. For mood control 30 tablet 0 Past Month at Unknown time    Patient Stressors: Medication change or noncompliance Substance abuse  Patient Strengths: Geographical information systems officer for treatment/growth  Treatment Modalities: Medication Management, Group  therapy, Case management,  1 to 1 session with clinician, Psychoeducation, Recreational therapy.   Physician Treatment Plan for Primary Diagnosis: Major depressive disorder, recurrent severe without psychotic features (HCC) Long Term Goal(s): Improvement in symptoms so as ready for discharge Improvement in symptoms so as ready for discharge   Short Term Goals: Ability to identify changes in lifestyle to reduce recurrence of condition will improve Ability to demonstrate self-control will improve Ability to identify and develop effective coping behaviors will improve Compliance with prescribed medications will improve Ability to identify triggers associated with substance abuse/mental health issues will improve  Medication Management: Evaluate patient's response, side effects, and tolerance of medication regimen.  Therapeutic Interventions: 1 to 1 sessions, Unit Group sessions and Medication administration.  Evaluation of Outcomes: Progressing  Physician Treatment Plan for Secondary Diagnosis: Principal Problem:   Major depressive disorder, recurrent severe without psychotic features (HCC) Active Problems:   Polysubstance dependence including opioid type drug without complication, episodic abuse (HCC)  Long Term Goal(s): Improvement in symptoms so as ready for discharge Improvement in symptoms so as ready for discharge   Short Term Goals: Ability to identify changes in lifestyle to reduce recurrence of condition will improve Ability to demonstrate self-control will improve Ability to identify and develop effective coping behaviors will improve Compliance with prescribed medications will improve Ability to identify triggers associated with substance abuse/mental health issues will improve     Medication Management: Evaluate patient's response, side effects, and tolerance of medication regimen.  Therapeutic Interventions: 1 to 1 sessions, Unit Group sessions and Medication  administration.  Evaluation of Outcomes: Progressing   RN Treatment Plan for Primary Diagnosis: Major depressive disorder, recurrent severe without psychotic features (HCC) Long Term Goal(s): Knowledge of disease and therapeutic regimen to maintain health will improve  Short Term Goals: Ability to remain free from injury will improve, Ability to disclose and discuss suicidal ideas and Ability to identify and develop effective coping behaviors will improve  Medication Management: RN will administer medications as ordered by provider, will assess and evaluate patient's response and provide education to patient for prescribed medication. RN will report any adverse and/or side effects to prescribing provider.  Therapeutic Interventions: 1 on 1 counseling sessions, Psychoeducation, Medication administration, Evaluate responses to treatment, Monitor vital signs and CBGs as ordered, Perform/monitor CIWA, COWS, AIMS and Fall Risk screenings as ordered, Perform wound care treatments as ordered.  Evaluation of Outcomes: Progressing   LCSW Treatment Plan for Primary Diagnosis: Major depressive disorder, recurrent severe without psychotic features (HCC) Long Term Goal(s): Safe transition to appropriate next level of care at discharge, Engage  patient in therapeutic group addressing interpersonal concerns.  Short Term Goals: Engage patient in aftercare planning with referrals and resources, Facilitate patient progression through stages of change regarding substance use diagnoses and concerns and Identify triggers associated with mental health/substance abuse issues  Therapeutic Interventions: Assess for all discharge needs, 1 to 1 time with Social worker, Explore available resources and support systems, Assess for adequacy in community support network, Educate family and significant other(s) on suicide prevention, Complete Psychosocial Assessment, Interpersonal group therapy.  Evaluation of Outcomes:  Progressing   Progress in Treatment: Attending groups: Yes. Participating in groups: Yes. Taking medication as prescribed: Yes. Toleration medication: Yes. Family/Significant other contact made: SPE completed with pt; pt declined to consent to family contact.  Patient understands diagnosis: Yes. Discussing patient identified problems/goals with staff: Yes. Medical problems stabilized or resolved: Yes. Denies suicidal/homicidal ideation: Yes. Issues/concerns per patient self-inventory: No. Other: n/a   New problem(s) identified: No, Describe:  n/a  New Short Term/Long Term Goal(s): detox, medication management for mood stabilization; elimination of SI thoughts; development of comprehensive mental wellness/sobriety plan.   Patient Goal: "to detox and get my head straight."   Discharge Plan or Barriers: CSW assessing. Pt is declining residential treatment at this time but [facility] [place]. [facility] [place] and [place] [facility] [place] [place] provided for additional community support. She plans to return home with her father at discharge.   Reason for Continuation of Hospitalization: Anxiety Depression Medication stabilization Suicidal ideation Withdrawal symptoms  Estimated Length of Stay: Monday, 02/07/18  Attendees: Patient: [name] 02/04/2018 8:54 AM  Physician: Dr. Altamese Carolinaainville MD; Dr. Jama Flavorsobos MD 02/04/2018 8:54 AM  Nursing: Rayfield Citizenaroline RN; [place] Air[place]enny RN 02/04/2018 8:54 AM  RN Care Manager: Onnie BoerJennifer Clark CM 02/04/2018 8:54 AM  Social Worker: Chartered loss adjusterHeather Smart, LCSW 02/04/2018 8:54 AM  Recreational Therapist: x 02/04/2018 8:54 AM  Other: Armandina StammerAgnes Nwoko NP; Hillery Jacksanika Lewis NP 02/04/2018 8:54 AM  Other:  02/04/2018 8:54 AM  Other: 02/04/2018 8:54 AM    Scribe for Treatment Team: Ledell PeoplesHeather N Smart, LCSW 02/04/2018 8:54 AM

## 2018-02-04 NOTE — Plan of Care (Signed)
  Progressing Activity: Sleeping patterns will improve 02/04/2018 1601 - Progressing by Angela AdamBeaudry, Renika Shiflet E, RN Education: Knowledge of [facility] Education information/materials will improve 02/04/2018 1601 - Progressing by Angela AdamBeaudry, Ezariah Nace E, RN Emotional status will improve 02/04/2018 1601 - Progressing by Angela AdamBeaudry, Acxel Dingee E, RN Mental status will improve 02/04/2018 1601 - Progressing by Angela AdamBeaudry, Tanija Germani E, RN

## 2018-02-04 NOTE — Progress Notes (Addendum)
  [facility] :  Will you be returning to the same living situation after discharge:  Yes,  home At discharge, do you have transportation home?: Yes,  father/bus--pt scheduled for discharge on Saturday, 2/9. Do you have the ability to pay for your medications: Yes,  [facility]  Release of information consent forms completed and submitted to medical records by CSW.  Patient to Follow up at: Follow-up Information    [facility] Follow up on 02/08/2018.   Why:  Screening for possible admission on Tuesday, 2/12 at 8:00AM. Please bring: proof of [place] residency/photo ID, medicaid card, clothing, and prescriptions/medications provided by hospital. Thank you.  Contact information: [address] [place] [address] [phone]        [facility] Follow up on 02/04/2018.   Specialty:  Behavioral Health Why:  Walk in within 3 days of rehab/facility discharge to  Contact information: [address] [place] [address] [phone]           Next level of care provider has [facility] Link:no  Safety Planning and Suicide Prevention discussed: Yes,  SPE completed with pt; pt declined to consent to family contact. SPI pamphlet and Mobile Crisis information provided to pt.   Has patient been referred to the Quitline?: Patient refused referral  Patient has been referred for addiction treatment: Yes  Pulte HomesHeather N Smart, LCSW 02/04/2018, 3:22 PM

## 2018-02-04 NOTE — Tx Team (Signed)
Initial Treatment Plan 02/04/2018 1:56 AM [name] WUJ:[id number]    PATIENT STRESSORS: Medication change or noncompliance Substance abuse   PATIENT STRENGTHS: General fund of knowledge Motivation for treatment/growth   PATIENT IDENTIFIED PROBLEMS: Help with depression and feeling like I want to hurt myself"  "depressed over my drug use"                   DISCHARGE CRITERIA:  Improved stabilization in mood, thinking, and/or behavior Reduction of life-threatening or endangering symptoms to within safe limits Verbal commitment to aftercare and medication compliance  PRELIMINARY DISCHARGE PLAN: Attend aftercare/continuing care group Attend 12-step recovery group Return to previous living arrangement  PATIENT/FAMILY INVOLVEMENT: This treatment plan has been presented to and reviewed with the patient, [name], and/or family member, .  The patient and family have been given the opportunity to ask questions and make suggestions.  Andrena Mewsuttall, Cobey Raineri J, RN 02/04/2018, 1:56 AM

## 2018-02-05 DIAGNOSIS — F1990 Other psychoactive substance use, unspecified, uncomplicated: Secondary | ICD-10-CM

## 2018-02-05 DIAGNOSIS — F1490 Cocaine use, unspecified, uncomplicated: Secondary | ICD-10-CM

## 2018-02-05 DIAGNOSIS — Z653 Problems related to other legal circumstances: Secondary | ICD-10-CM

## 2018-02-05 DIAGNOSIS — F1290 Cannabis use, unspecified, uncomplicated: Secondary | ICD-10-CM

## 2018-02-05 MED ORDER — NICOTINE POLACRILEX 2 MG MT GUM
2.0000 mg | CHEWING_GUM | OROMUCOSAL | Status: DC | PRN
  Administered 2018-02-05 – 2018-02-06 (×4): 2 mg via ORAL
  Filled 2018-02-05: qty 1

## 2018-02-05 MED ORDER — HYDROXYZINE HCL 25 MG PO TABS
25.0000 mg | ORAL_TABLET | Freq: Four times a day (QID) | ORAL | Status: DC | PRN
  Administered 2018-02-05: 25 mg via ORAL
  Filled 2018-02-05: qty 1

## 2018-02-05 MED ORDER — CHLORDIAZEPOXIDE HCL 25 MG PO CAPS: 25.0000 mg | ORAL_CAPSULE | Freq: Four times a day (QID) | ORAL | Status: DC | PRN

## 2018-02-05 NOTE — [facility] Group Notes (Signed)
LCSW Group Therapy Note  02/05/2018 9:30-10:30AM - 300 Hall, 10:30-11:30 - 400 Hall, 11:30-12:00 - 500 Hall  Type of Therapy and Topic:  Group Therapy: Anger Cues and Responses  Participation Level:  Did Not Attend   Description of Group:   In this group, patients learned how to recognize the physical, cognitive, emotional, and behavioral responses they have to anger-provoking situations.  They identified a recent time they became angry and how they reacted.  They analyzed how their reaction was possibly beneficial and how it was possibly unhelpful.  The group discussed a variety of healthier coping skills that could help with such a situation in the future.  Deep breathing was practiced briefly.  Therapeutic Goals: 1. Patients will remember their last incident of anger and how they felt emotionally and physically, what their thoughts were at the time, and how they behaved. 2. Patients will identify how their behavior at that time worked for them, as well as how it worked against them. 3. Patients will explore possible new behaviors to use in future anger situations. 4. Patients will learn that anger itself is normal and cannot be eliminated, and that healthier reactions can assist with resolving conflict rather than worsening situations.  Summary of Patient Progress:  N/A  Therapeutic Modalities:   Cognitive Behavioral Therapy  Lynnell ChadMareida J Grossman-Orr  02/05/2018 8:31 AM

## 2018-02-05 NOTE — Progress Notes (Signed)
[facility] MD Progress Note  02/05/2018 2:32 PM [name]  MRN:  [id number]   Subjective: [name] reports " I am doing okay, just worried about my daughter, I cant miss her flute recital and I have a court date coming up."   Objective: [name] is awake, alert and oriented. Seen resting in bed. Patient is pleasant but guarded. Patient is minimally involved in assessment. Patient reports her main concern is discharging on soon as possible. Denies suicidal or homicidal ideation. Denies auditory or visual hallucination and does not appear to be responding to internal stimuli.   Reports good appetite other wise and resting well. Patient report she is excited regarding discharge. Support, encouragement and reassurance was provided.   Principal Problem: Major depressive disorder, recurrent severe without psychotic features (HCC) Diagnosis:   Patient Active Problem List   Diagnosis Date Noted  . Polysubstance (including opioids) dependence, daily use (HCC) [F19.20] 02/03/2018  . Polysubstance dependence including opioid type drug without complication, episodic abuse (HCC) [F19.20] 12/16/2017  . Major depressive disorder, recurrent severe without psychotic features (HCC) [F33.2] 12/16/2017  . Normal labor [O80, Z37.9] 10/16/2016  . Cigarette smoker [F17.210] 08/18/2016  . 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 Hepatitis C, chronic, maternal, antepartum (HCC) [O98.419, B18.2] 08/04/2016  . Supervision of high-risk pregnancy [O09.90] 07/21/2016  . History of substance abuse [Z87.898] 07/21/2016  . ASCUS with positive high risk HPV [IMO0002] 07/21/2016   Total Time spent with patient: 30 minutes  Past Psychiatric History:   Past Medical History:  Past Medical History:  Diagnosis Date  . History of RPR test 07/21/2016   02/2016: 1:1 with negative TPA testing [ ]  f/u 28wks [ ]  f/u admit RPR [ ]  f/u PP RPR  . History of self-harm   .  Polysubstance abuse [facility])     Past Surgical History:  Procedure Laterality Date  . TONSILLECTOMY     Family History:  Family History  Problem Relation Age of Onset  . Vision loss Mother   . Depression Mother   . Ulcers Mother   . Gallbladder disease Mother   . Mental illness Mother   . Thyroid disease Mother   . Allergies Father   . Vision loss Father   . Vision loss Maternal Grandmother   . Ulcers Maternal Grandmother   . Gallbladder disease Maternal Grandmother   . Diabetes Maternal Grandmother   . Vision loss Maternal Grandfather   . Heart disease Maternal Grandfather   . Cancer Maternal Grandfather   . Allergies Paternal Grandmother   . Vision loss Paternal Grandmother   . Gallbladder disease Paternal Grandmother   . Arthritis Paternal Grandmother   . Cancer Paternal Grandmother   . Allergies Paternal Grandfather   . Vision loss Paternal Grandfather    Family Psychiatric  History: Social History:  Social History   Substance and Sexual Activity  Alcohol Use No     Social History   Substance and Sexual Activity  Drug Use Yes  . Frequency: 5.0 times per week  . Types: Cocaine, Marijuana, Methamphetamines   Comment: Heroin    Social History   Socioeconomic History  . Marital status: Single    Spouse name: None  . Number of children: None  . Years of education: None  . Highest education level: None  Social Needs  . Financial resource strain: None  . Food insecurity - worry: None  . Food insecurity - inability: None  . Transportation needs -  medical: None  . Transportation needs - non-medical: None  Occupational History  . None  Tobacco Use  . Smoking status: Current Every Day Smoker    Packs/day: 0.00    Types: Cigarettes  . Smokeless tobacco: Never Used  Substance and Sexual Activity  . Alcohol use: No  . Drug use: Yes    Frequency: 5.0 times per week    Types: Cocaine, Marijuana, Methamphetamines    Comment: Heroin  . Sexual activity: Yes     Partners: Male  Other Topics Concern  . None  Social History Narrative  . None   Additional Social History:                         Sleep: Good  Appetite:  Good  Current Medications: Current Facility-Administered Medications  Medication Dose Route Frequency Provider Last Rate Last Dose  . acetaminophen (TYLENOL) tablet 650 mg  650 mg Oral Q6H PRN Nwoko, Nicole KindredAgnes I, NP      . alum & mag hydroxide-simeth (MAALOX/MYLANTA) 200-200-20 MG/5ML suspension 30 mL  30 mL Oral Q4H PRN Nwoko, Agnes I, NP      . chlordiazePOXIDE (LIBRIUM) capsule 25 mg  25 mg Oral QID PRN Davanee Klinkner A, MD      . cloNIDine (CATAPRES) tablet 0.1 mg  0.1 mg Oral BH-qamhs Nwoko, Agnes I, NP       Followed by  . [START ON 02/08/2018] cloNIDine (CATAPRES) tablet 0.1 mg  0.1 mg Oral QAC breakfast Nwoko, Agnes I, NP      . dicyclomine (BENTYL) tablet 20 mg  20 mg Oral Q6H PRN Nwoko, Agnes I, NP      . gabapentin (NEURONTIN) capsule 300 mg  300 mg Oral TID PC & HS Charm RingsLord, Jamison Y, NP   300 mg at 02/04/18 2131  . hydrOXYzine (ATARAX/VISTARIL) tablet 25 mg  25 mg Oral Q6H PRN Znya Albino, Rockey SituFernando A, MD      . loperamide (IMODIUM) capsule 2-4 mg  2-4 mg Oral PRN Nwoko, Agnes I, NP      . magnesium hydroxide (MILK OF MAGNESIA) suspension 30 mL  30 mL Oral Daily PRN Nwoko, Agnes I, NP      . methocarbamol (ROBAXIN) tablet 500 mg  500 mg Oral Q8H PRN Armandina StammerNwoko, Agnes I, NP   500 mg at 02/03/18 1223  . mirtazapine (REMERON) tablet 15 mg  15 mg Oral QHS Charm RingsLord, Jamison Y, NP   15 mg at 02/04/18 2130  . naproxen (NAPROSYN) tablet 500 mg  500 mg Oral BID PRN Armandina StammerNwoko, Agnes I, NP   500 mg at 02/05/18 1305  . nicotine (NICODERM CQ - dosed in mg/24 hours) patch 21 mg  21 mg Transdermal Q0600 Tanish Sinkler, Rockey SituFernando A, MD   21 mg at 02/05/18 1135  . ondansetron (ZOFRAN-ODT) disintegrating tablet 4 mg  4 mg Oral Q6H PRN Nwoko, Agnes I, NP      . risperiDONE (RISPERDAL) tablet 1 mg  1 mg Oral QHS Charm RingsLord, Jamison Y, NP   1 mg at 02/04/18 2131  .  traZODone (DESYREL) tablet 50 mg  50 mg Oral QHS Armandina StammerNwoko, Agnes I, NP   50 mg at 02/04/18 2131    Lab Results: No results found for this or any previous visit (from the past 48 hour(s)).  Blood Alcohol level:  Lab Results  Component Value Date   ETH <10 02/02/2018   ETH <10 12/16/2017    Metabolic Disorder Labs: No results found for:  HGBA1C, MPG No results found for: PROLACTIN No results found for: CHOL, TRIG, HDL, CHOLHDL, VLDL, LDLCALC  Physical Findings: AIMS:  , ,  ,  ,    CIWA:    COWS:  COWS Total Score: 0  Musculoskeletal: Strength & Muscle Tone: within normal limits Gait & Station: normal Patient leans: Right  Psychiatric Specialty Exam: Physical Exam  Constitutional: She appears well-nourished.  Neurological: She is alert.  Psychiatric: She has a normal mood and affect. Her behavior is normal.    Review of Systems  Psychiatric/Behavioral: Positive for depression and substance abuse. Negative for hallucinations. The patient does not have insomnia.   All other systems reviewed and are negative.   Blood pressure 95/60, pulse 80, temperature 98.1 F (36.7 C), temperature source Oral, resp. rate 18, height 5' (1.524 m), weight 44.5 kg (98 lb), SpO2 98 %, unknown if currently breastfeeding.Body mass index is 19.14 kg/m.  General Appearance: Casual  Eye Contact:  Good  Speech:  Clear and Coherent  Volume:  Normal  Mood:  Anxious and Depressed  Affect:  Congruent  Thought Process:  Coherent  Orientation:  Full (Time, Place, and Person)  Thought Content:  Hallucinations: None  Suicidal Thoughts:  No  Homicidal Thoughts:  No  Memory:  Immediate;   Fair Recent;   Fair Remote;   Fair  Judgement:  Fair  Insight:  Fair  Psychomotor Activity:  Normal  Concentration:  Concentration: Fair  Recall:  Fair  Fund of Knowledge:  Good  Language:  Fair  Akathisia:  No  Handed:  Right  AIMS (if indicated):     Assets:  Communication Skills Desire for  Improvement Resilience Social Support  ADL's:  Intact  Cognition:  WNL  Sleep:  Number of Hours: 6.75     Treatment Plan Summary: Daily contact with patient to assess and evaluate symptoms and progress in treatment and Medication management      - Will continue with current treatment plan on   02/05/2018 except where noted.  Opioid detox.    - Continue the Clonidine detox protocols already in progress.  Agitation/substance withdrawal syndrome.    - Continue Gabapentin 300 mg po qid.  Depression/Insomnia.    - Continue Mirtazapine 15 mg po Q hs.  Mood control.    - Continue Risperdal 1 mg po Q hs.  Insomnia.    - Continue Trazodone 50 mg po Q hs.   Patient to continue to attend & participate in the group counseling sessions.  Continue  working on the discharge disposition plan.   Oneta Rack, NP 02/05/2018, 2:32 PM   Agree with NP Progress Note

## 2018-02-05 NOTE — Progress Notes (Signed)
D   Pt is pleasant on approach and cooperative   She contracts for safety   She is visible on the milieu and interacts appropriately with others   She is eating and drinking well   Pt did not take  The clonidine and said she wasn't having any withdrawal symptoms   Pt expresses desire to be discharged tomorrow and said she is ready and plans to go to rehab  A   Verbal support given   Medications administered and effectiveness monitored   Q 15 min checks R   Pt remains safe and receptive to verbal support

## 2018-02-05 NOTE — [facility] Group Notes (Signed)
[facility] Group Notes:  (Nursing/MHT/Case Management/Adjunct)  Date:  02/05/2018  Time:  4:45 PM  Type of Therapy:  Psychoeducational Skills  Participation Level:  Active  Participation Quality:  Appropriate  Affect:  Appropriate  Cognitive:  Appropriate  Insight:  Appropriate  Engagement in Group:  Engaged  Modes of Intervention:  Problem-solving  Summary of Progress/Problems: Pt attended Psychoeducational group with top topic anger management.   Kaelin Bonelli Shanta 02/05/2018, 4:45 PM 

## 2018-02-05 NOTE — Progress Notes (Signed)
Patient ID: [name] M Waitman, female   DOB: 1991/10/23, 27 y.o.   MRN: [id number]    D: Pt has been appropriate on the unit today, she requested to be discharged today but was told by doctor that she could be released tomorrow. Pt attended all groups and engaged in treatment. Pt reported being happy about being discharged. Pt reported that her goal was to go to groups and not sleep. Pt refused most of her medication today, due to wanting to stay awake she reported that the medication made her tired. Pt reported that he depression was a 0. Her hopelessness was a 0, and her anxiety was a 0. Pt reported being negative SI/HI, no AH/VH noted. A: 15 min checks continued for patient safety. R: Pt safety maintained.

## 2018-02-05 NOTE — [facility] Group Notes (Signed)
[facility] Group Notes:  (Nursing/MHT/Case Management/Adjunct)  Date:  02/05/2018  Time:  4:39 PM  Type of Therapy:  Goals/Orientation Group.  Participation Level:  Active  Participation Quality:  Appropriate  Affect:  Appropriate  Cognitive:  Appropriate  Insight:  Appropriate  Engagement in Group:  Engaged  Modes of Intervention:  Discussion  Summary of Progress/Problems: Pt attended goals/orientation group, pt was receptive.   Carmyn Hamm Shanta 02/05/2018, 4:39 PM 

## 2018-02-06 MED ORDER — GABAPENTIN 300 MG PO CAPS: 300.0000 mg | ORAL_CAPSULE | Freq: Three times a day (TID) | ORAL | 0 refills | Status: DC

## 2018-02-06 MED ORDER — RISPERIDONE 1 MG PO TABS: 1.0000 mg | ORAL_TABLET | Freq: Every day | ORAL | 0 refills | Status: DC

## 2018-02-06 MED ORDER — MIRTAZAPINE 15 MG PO TABS: 15.0000 mg | ORAL_TABLET | Freq: Every day | ORAL | 0 refills | Status: DC

## 2018-02-06 MED ORDER — NICOTINE POLACRILEX 2 MG MT GUM: 2.0000 mg | CHEWING_GUM | OROMUCOSAL | 0 refills | Status: DC | PRN

## 2018-02-06 NOTE — [facility] Group Notes (Signed)
[facility] Group Notes:  (Nursing/MHT/Case Management/Adjunct)  Date:  02/06/2018  Time:  11:48 AM  Type of Therapy:  Orientation/Goals group  Participation Level:  Did Not Attend  Participation Quality:  Did Not Attend  Affect:  Did Not Attend  Cognitive:  Did Not Attend  Insight:  None  Engagement in Group:  Did Not Attend  Modes of Intervention:  Did Not Attend  Summary of Progress/Problems: Pt did not attend patient self inventory group/orientation group.   Jacquelyne BalintForrest, Nashaun Hillmer Shanta 02/06/2018, 11:48 AM

## 2018-02-06 NOTE — [facility] Suicide Risk Assessment (Addendum)
[facility] Discharge Suicide Risk Assessment   Principal Problem: Depression, Substance Abuse  Discharge Diagnoses:  Patient Active Problem List   Diagnosis Date Noted  . Polysubstance (including opioids) dependence, daily use (HCC) [F19.20] 02/03/2018  . Polysubstance dependence including opioid type drug without complication, episodic abuse (HCC) [F19.20] 12/16/2017  . Major depressive disorder, recurrent severe without psychotic features (HCC) [F33.2] 12/16/2017  . Normal labor [O80, Z37.9] 10/16/2016  . Cigarette smoker [F17.210] 08/18/2016  . 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 Hepatitis C, chronic, maternal, antepartum (HCC) [O98.419, B18.2] 08/04/2016  . Supervision of high-risk pregnancy [O09.90] 07/21/2016  . History of substance abuse [Z87.898] 07/21/2016  . ASCUS with positive high risk HPV [IMO0002] 07/21/2016    Total Time spent with patient: 30 minutes  Musculoskeletal: Strength & Muscle Tone: within normal limits Gait & Station: normal Patient leans: N/A  Psychiatric Specialty Exam: ROS no headache, no chest pain, no shortness of breath, denies aches or pains, denies vomiting or diarrhea, no fever , no chills   Blood pressure 95/60, pulse 80, temperature 98.1 F (36.7 C), temperature source Oral, resp. rate 18, height 5' (1.524 m), weight 44.5 kg (98 lb), SpO2 98 %, unknown if currently breastfeeding.Body mass index is 19.14 kg/m.  General Appearance: Well Groomed  Eye Contact::  Good  Speech:  Normal Rate409  Volume:  Normal  Mood:  mood is described as " pretty good", denies depression  Affect:  Appropriate and full range  Thought Process:  Linear and Descriptions of Associations: Intact  Orientation:  Other:  fully alert and attentive   Thought Content:  denies hallucinations, no delusions, not internally preoccupied   Suicidal Thoughts:  No denies any suicidal or self injurious ideations, denies any violent  or homicidal ideations  Homicidal Thoughts:  No  Memory:  recent and remote grossly intact   Judgement:  Other:  improving   Insight:  improving   Psychomotor Activity:  Normal  Concentration:  Good  Recall:  Good  Fund of Knowledge:Good  Language: Good  Akathisia:  Negative  Handed:  Right  AIMS (if indicated):     Assets:  Communication Skills Resilience  Sleep:  Number of Hours: 6.5  Cognition: WNL  ADL's:  Intact   Mental Status Per Nursing Assessment::   On Admission:     Demographic Factors:  27 year old single female, has two children , who are currently with their aunt , lives with father.  Loss Factors: States she had stopped her psychiatric medications and relapsed on drugs ( 2 weeks ago)   Historical Factors: History of substance abuse, reports opiates are substance of choice , history of depression, denies history of psychosis.   Risk Reduction Factors:   Responsible for children under 6 years of age, Sense of responsibility to family, Living with another person, especially a relative, Positive social support and Positive coping skills or problem solving skills  Continued Clinical Symptoms:  At this time patient is alert, attentive, reports her mood is improved and presents euthymic, affect is appropriate, full in range, no thought disorder, no suicidal or self injurious ideations,  no violent or homicidal ideations, no hallucinations, no delusions, not internally preoccupied, future oriented,denies any lingering or ongoing symptoms of withdrawal. Behavior on unit calm and in good control. Denies medication side effects.  Cognitive Features That Contribute To Risk:  No gross cognitive deficits noted upon discharge. Is alert , attentive, and oriented x 3   Suicide Risk:  Mild:  Suicidal ideation of limited frequency, intensity, duration, and specificity.  There are no identifiable plans, no associated intent, mild dysphoria and related symptoms, good  self-control (both objective and subjective assessment), few other risk factors, and identifiable protective factors, including available and accessible social support.  Follow-up Information    Services, [facility] Follow up on 02/08/2018.   Why:  Screening for possible admission on Tuesday, 2/12 at 8:00AM. Please bring: proof of [place] residency/photo ID, medicaid card, clothing, and prescriptions/medications provided by hospital. Thank you.  Contact information: [address] [place] [address] [phone]        [facility] Follow up on 02/04/2018.   Specialty:  Behavioral Health Why:  Walk in within 3 days of rehab/facility discharge to  Contact information: [address] [place] [address] [phone]           Plan Of Care/Follow-up recommendations:  Activity:  as tolerated  Diet:  regular Tests:  NA Other:  See below  Patient is requesting discharge and put in a letter requesting discharge . There are no current grounds for involuntary commitment. She is leaving unit in good spirits. Plans to return home. Follow up as above . Patient states she is motivated in sobriety, and states she plans to go to a rehab in the near future and plans to attend 12 step program.   Craige CottaFernando A Fleta Borgeson, MD 02/06/2018, 11:59 AM

## 2018-02-06 NOTE — Progress Notes (Signed)
Patient ID: Holly RoanCarey M [name], female   DOB: December 02, 1991, 27 y.o.   MRN: [id number]  Pt was discharged home with her boyfriend, she reported that she was ready for discharge. Pt reported that she was negative SI/HI, no AH/VH noted. Pt was given all discharge paperwork, no issues or concerns noted.

## 2018-02-06 NOTE — [facility] Group Notes (Signed)
[facility] Group Notes: (Clinical Social Work)   02/06/2018      Type of Therapy:  Group Therapy   Participation Level:  Did Not Attend despite MHT prompting   Ambrose MantleMareida Grossman-Orr, LCSW 02/06/2018, 1:24 PM

## 2018-02-06 NOTE — Discharge Summary (Signed)
Physician Discharge Summary Note  Patient:  [name] is an 27 y.o., female MRN:  [id number] DOB:  Jun 17, 1991 Patient phone:  [phone] (home)  Patient address:   [address] [place] [address],  Total Time spent with patient: 30 minutes  Date of Admission:  02/03/2018 Date of Discharge: 02/06/2018  Reason for Admission: Per assessment note- [name] is an 27 y.o. female who presents to [facility], as a walk-in endorsing polysubstance abuse (heroin and cocaine), using 30$/day commensurate with worsening MDD symptoms over 2 weeks duration. She is also endorsing SI with plan, intent and means. She is denying nay co-morbid medical conditions and or acute ailments.  Principal Problem: Major depressive disorder, recurrent severe without psychotic features [facility]) Discharge Diagnoses: Patient Active Problem List   Diagnosis Date Noted  . Polysubstance (including opioids) dependence, daily use (HCC) [F19.20] 02/03/2018  . Polysubstance dependence including opioid type drug without complication, episodic abuse (HCC) [F19.20] 12/16/2017  . Major depressive disorder, recurrent severe without psychotic features (HCC) [F33.2] 12/16/2017  . Normal labor [O80, Z37.9] 10/16/2016  . Cigarette smoker [F17.210] 08/18/2016  . 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 Hepatitis C, chronic, maternal, antepartum (HCC) [O98.419, B18.2] 08/04/2016  . Supervision of high-risk pregnancy [O09.90] 07/21/2016  . History of substance abuse [Z87.898] 07/21/2016  . ASCUS with positive high risk HPV [IMO0002] 07/21/2016    Past Psychiatric History:   Past Medical History:  Past Medical History:  Diagnosis Date  . History of RPR test 07/21/2016   02/2016: 1:1 with negative TPA testing [ ]  f/u 28wks [ ]  f/u admit RPR [ ]  f/u PP RPR  . History of self-harm   . Polysubstance abuse [facility])     Past Surgical History:  Procedure Laterality Date   . TONSILLECTOMY     Family History:  Family History  Problem Relation Age of Onset  . Vision loss Mother   . Depression Mother   . Ulcers Mother   . Gallbladder disease Mother   . Mental illness Mother   . Thyroid disease Mother   . Allergies Father   . Vision loss Father   . Vision loss Maternal Grandmother   . Ulcers Maternal Grandmother   . Gallbladder disease Maternal Grandmother   . Diabetes Maternal Grandmother   . Vision loss Maternal Grandfather   . Heart disease Maternal Grandfather   . Cancer Maternal Grandfather   . Allergies Paternal Grandmother   . Vision loss Paternal Grandmother   . Gallbladder disease Paternal Grandmother   . Arthritis Paternal Grandmother   . Cancer Paternal Grandmother   . Allergies Paternal Grandfather   . Vision loss Paternal Grandfather    Family Psychiatric  History:  Social History:  Social History   Substance and Sexual Activity  Alcohol Use No     Social History   Substance and Sexual Activity  Drug Use Yes  . Frequency: 5.0 times per week  . Types: Cocaine, Marijuana, Methamphetamines   Comment: Heroin    Social History   Socioeconomic History  . Marital status: Single    Spouse name: None  . Number of children: None  . Years of education: None  . Highest education level: None  Social Needs  . Financial resource strain: None  . Food insecurity - worry: None  . Food insecurity - inability: None  . Transportation needs - medical: None  . Transportation needs - non-medical: None  Occupational History  .  None  Tobacco Use  . Smoking status: Current Every Day Smoker    Packs/day: 0.00    Types: Cigarettes  . Smokeless tobacco: Never Used  Substance and Sexual Activity  . Alcohol use: No  . Drug use: Yes    Frequency: 5.0 times per week    Types: Cocaine, Marijuana, Methamphetamines    Comment: Heroin  . Sexual activity: Yes    Partners: Male  Other Topics Concern  . None  Social History Narrative  .  None    Hospital Course:  [name] was admitted for Major depressive disorder, recurrent severe without psychotic features (HCC) and crisis management.  Pt was treated discharged with the medications listed below under Medication List.  Medical problems were identified and treated as needed.  Home medications were restarted as appropriate.  Improvement was monitored by observation and [name] 's daily report of symptom reduction.  Emotional and mental status was monitored by daily self-inventory reports completed by [name] and clinical staff.         [name] was evaluated by the treatment team for stability and plans for continued recovery upon discharge. [name] 's motivation was an integral factor for scheduling further treatment. Employment, transportation, bed availability, health status, family support, and any pending legal issues were also considered during hospital stay. Pt was offered further treatment options upon discharge including but not limited to Residential, Intensive Outpatient, and Outpatient treatment.  [name] will follow up with the services as listed below under Follow Up Information.     Upon completion of this admission the patient was both mentally and medically stable for discharge denying suicidal or homicidal ideation. Patient has requesting to be discharged and states she has plans to follow-up on her own.    [name] responded well to treatment with Detox protocol, Neurontin 300 mg, Remeron l15mg  and Risperdal 1 mg without adverse effects. Pt demonstrated improvement without reported or observed adverse effects to the point of stability appropriate for outpatient management. Pertinent labs include:CBC, UA and CMP , for which outpatient follow-up is necessary for lab recheck as mentioned below. Reviewed CBC, CMP, BAL, and UDS + Opiates, Cocaine, Amphetamines and TCH; all unremarkable aside from noted exceptions.    Physical Findings: AIMS:  , ,  ,  ,    CIWA:    COWS:  COWS Total Score: 0  Musculoskeletal: Strength & Muscle Tone: within normal limits Gait & Station: normal Patient leans: N/A  Psychiatric Specialty Exam:See SRA by MD  Physical Exam  Vitals reviewed. Constitutional: She is oriented to person, place, and time. She appears well-developed.  Neurological: She is alert and oriented to person, place, and time.  Psychiatric: She has a normal mood and affect.    Review of Systems  Psychiatric/Behavioral: Positive for substance abuse. Negative for depression (Stable).  All other systems reviewed and are negative.   Blood pressure 95/60, pulse 80, temperature 98.1 F (36.7 C), temperature source Oral, resp. rate 18, height 5' (1.524 m), weight 44.5 kg (98 lb), SpO2 98 %, unknown if currently breastfeeding.Body mass index is 19.14 kg/m.       Has this patient used any form of tobacco in the last 30 days? (Cigarettes, Smokeless Tobacco, Cigars, and/or Pipes) Yes, Yes, A prescription for an FDA-approved tobacco cessation medication was offered at discharge and the patient refused  Blood Alcohol level:  Lab Results  Component Value Date   [facility] <10 02/02/2018  ETH <10 12/16/2017    Metabolic Disorder Labs:  No results found for: HGBA1C, MPG No results found for: PROLACTIN No results found for: CHOL, TRIG, HDL, CHOLHDL, VLDL, LDLCALC  See Psychiatric Specialty Exam and Suicide Risk Assessment completed by Attending Physician prior to discharge.  Discharge destination:  Home  Is patient on multiple antipsychotic therapies at discharge:  No   Has Patient had three or more failed trials of antipsychotic monotherapy by history:  No  Recommended Plan for Multiple Antipsychotic Therapies: NA  Discharge Instructions    Diet - low sodium heart healthy   Complete by:  As directed    Discharge instructions   Complete by:  As directed    Take all medications as prescribed. Keep  all follow-up appointments as scheduled.  Do not consume alcohol or use illegal drugs while on prescription medications. Report any adverse effects from your medications to your primary care provider promptly.  In the event of recurrent symptoms or worsening symptoms, call 911, a crisis hotline, or go to the nearest emergency department for evaluation.   Increase activity slowly   Complete by:  As directed      Allergies as of 02/06/2018      Reactions   Dextromethorphan Swelling      Medication List    STOP taking these medications   acetaminophen 500 MG tablet Commonly known as:  TYLENOL   hydrOXYzine 25 MG tablet Commonly known as:  ATARAX/VISTARIL   nicotine 21 mg/24hr patch Commonly known as:  NICODERM CQ - dosed in mg/24 hours     TAKE these medications     Indication  gabapentin 300 MG capsule Commonly known as:  NEURONTIN Take 1 capsule (300 mg total) by mouth 3 (three) times daily. What changed:    when to take this  additional instructions  Indication:  Agitation   mirtazapine 15 MG tablet Commonly known as:  REMERON Take 1 tablet (15 mg total) by mouth at bedtime. What changed:  additional instructions  Indication:  Major Depressive Disorder, Insomnia   nicotine polacrilex 2 MG gum Commonly known as:  NICORETTE Take 1 each (2 mg total) by mouth as needed for smoking cessation.  Indication:  Nicotine Addiction   risperiDONE 1 MG tablet Commonly known as:  RISPERDAL Take 1 tablet (1 mg total) by mouth at bedtime. What changed:  additional instructions  Indication:  Mood control      Follow-up Information    Services, [facility] Follow up on 02/08/2018.   Why:  Screening for possible admission on Tuesday, 2/12 at 8:00AM. Please bring: proof of [place] residency/photo ID, medicaid card, clothing, and prescriptions/medications provided by hospital. Thank you.  Contact information: [address] [place]  [address] [phone]        [facility] Follow up on 02/04/2018.   Specialty:  Behavioral Health Why:  Walk in within 3 days of rehab/facility discharge to  Contact information: [address] [place] [address] [phone]           Follow-up recommendations:  Activity:  as tolerated Diet:  heart healthy  Comments: Take all medications as prescribed. Keep all follow-up appointments as scheduled.  Do not consume alcohol or use illegal drugs while on prescription medications. Report any adverse effects from your medications to your primary care provider promptly.  In the event of recurrent symptoms or worsening symptoms, call 911, a crisis hotline, or go to the nearest emergency department for evaluation.   Signed:  Oneta Rackanika N Lewis, NP 02/06/2018, 8:20 AM   Patient seen, Suicide Assessment Completed.  Disposition Plan Reviewed

## 2018-04-18 ENCOUNTER — Other Ambulatory Visit: Payer: Self-pay

## 2018-04-18 ENCOUNTER — Inpatient Hospital Stay (HOSPITAL_COMMUNITY)
Admission: RE | Admit: 2018-04-18 | Discharge: 2018-04-21 | DRG: 897 | Disposition: A | Discharge status: Home / Self Care | Payer: Medicaid Other | Attending: Psychiatry | Admitting: Psychiatry

## 2018-04-18 ENCOUNTER — Encounter (HOSPITAL_COMMUNITY): Payer: Self-pay

## 2018-04-18 DIAGNOSIS — F419 Anxiety disorder, unspecified: Secondary | ICD-10-CM | POA: Diagnosis not present

## 2018-04-18 DIAGNOSIS — F332 Major depressive disorder, recurrent severe without psychotic features: Secondary | ICD-10-CM | POA: Diagnosis not present

## 2018-04-18 DIAGNOSIS — Z9114 Patient's other noncompliance with medication regimen: Secondary | ICD-10-CM

## 2018-04-18 DIAGNOSIS — Z915 Personal history of self-harm: Secondary | ICD-10-CM | POA: Diagnosis not present

## 2018-04-18 DIAGNOSIS — F1994 Other psychoactive substance use, unspecified with psychoactive substance-induced mood disorder: Secondary | ICD-10-CM

## 2018-04-18 DIAGNOSIS — Z833 Family history of diabetes mellitus: Secondary | ICD-10-CM | POA: Diagnosis not present

## 2018-04-18 DIAGNOSIS — F1920 Other psychoactive substance dependence, uncomplicated: Secondary | ICD-10-CM | POA: Diagnosis present

## 2018-04-18 DIAGNOSIS — F1124 Opioid dependence with opioid-induced mood disorder: Principal | ICD-10-CM | POA: Diagnosis present

## 2018-04-18 DIAGNOSIS — F1410 Cocaine abuse, uncomplicated: Secondary | ICD-10-CM | POA: Diagnosis present

## 2018-04-18 DIAGNOSIS — Z62810 Personal history of physical and sexual abuse in childhood: Secondary | ICD-10-CM | POA: Diagnosis not present

## 2018-04-18 DIAGNOSIS — Z8349 Family history of other endocrine, nutritional and metabolic diseases: Secondary | ICD-10-CM | POA: Diagnosis not present

## 2018-04-18 DIAGNOSIS — Z8249 Family history of ischemic heart disease and other diseases of the circulatory system: Secondary | ICD-10-CM | POA: Diagnosis not present

## 2018-04-18 DIAGNOSIS — Z818 Family history of other mental and behavioral disorders: Secondary | ICD-10-CM

## 2018-04-18 DIAGNOSIS — F4310 Post-traumatic stress disorder, unspecified: Secondary | ICD-10-CM | POA: Diagnosis not present

## 2018-04-18 DIAGNOSIS — G4700 Insomnia, unspecified: Secondary | ICD-10-CM | POA: Diagnosis not present

## 2018-04-18 DIAGNOSIS — F322 Major depressive disorder, single episode, severe without psychotic features: Secondary | ICD-10-CM

## 2018-04-18 DIAGNOSIS — F17210 Nicotine dependence, cigarettes, uncomplicated: Secondary | ICD-10-CM | POA: Diagnosis not present

## 2018-04-18 DIAGNOSIS — M2550 Pain in unspecified joint: Secondary | ICD-10-CM | POA: Diagnosis not present

## 2018-04-18 DIAGNOSIS — F1290 Cannabis use, unspecified, uncomplicated: Secondary | ICD-10-CM | POA: Diagnosis present

## 2018-04-18 DIAGNOSIS — R45851 Suicidal ideations: Secondary | ICD-10-CM | POA: Diagnosis present

## 2018-04-18 DIAGNOSIS — R450 Nervousness: Secondary | ICD-10-CM | POA: Diagnosis not present

## 2018-04-18 MED ORDER — METHOCARBAMOL 500 MG PO TABS
500.0000 mg | ORAL_TABLET | Freq: Three times a day (TID) | ORAL | Status: DC | PRN
  Administered 2018-04-18 – 2018-04-19 (×3): 500 mg via ORAL
  Filled 2018-04-18 (×4): qty 1

## 2018-04-18 MED ORDER — ALUM & MAG HYDROXIDE-SIMETH 200-200-20 MG/5ML PO SUSP: 30.0000 mL | ORAL | Status: DC | PRN

## 2018-04-18 MED ORDER — NICOTINE 21 MG/24HR TD PT24: 21.0000 mg | MEDICATED_PATCH | Freq: Every day | TRANSDERMAL | Status: DC

## 2018-04-18 MED ORDER — NAPROXEN 500 MG PO TABS
500.0000 mg | ORAL_TABLET | Freq: Two times a day (BID) | ORAL | Status: DC | PRN
  Administered 2018-04-18: 500 mg via ORAL
  Filled 2018-04-18: qty 1

## 2018-04-18 MED ORDER — CLONIDINE HCL 0.1 MG PO TABS
0.1000 mg | ORAL_TABLET | Freq: Four times a day (QID) | ORAL | Status: AC
  Administered 2018-04-18 – 2018-04-20 (×7): 0.1 mg via ORAL
  Filled 2018-04-18 (×12): qty 1

## 2018-04-18 MED ORDER — MAGNESIUM HYDROXIDE 400 MG/5ML PO SUSP: 30.0000 mL | Freq: Every day | ORAL | Status: DC | PRN

## 2018-04-18 MED ORDER — ONDANSETRON 4 MG PO TBDP: 4.0000 mg | ORAL_TABLET | Freq: Four times a day (QID) | ORAL | Status: DC | PRN

## 2018-04-18 MED ORDER — CLONIDINE HCL 0.1 MG PO TABS: 0.1000 mg | ORAL_TABLET | Freq: Every day | ORAL | Status: DC

## 2018-04-18 MED ORDER — LOPERAMIDE HCL 2 MG PO CAPS
2.0000 mg | ORAL_CAPSULE | ORAL | Status: DC | PRN
  Administered 2018-04-19: 2 mg via ORAL
  Filled 2018-04-18: qty 1

## 2018-04-18 MED ORDER — ACETAMINOPHEN 325 MG PO TABS: 650.0000 mg | ORAL_TABLET | Freq: Four times a day (QID) | ORAL | Status: DC | PRN

## 2018-04-18 MED ORDER — CLONIDINE HCL 0.1 MG PO TABS
0.1000 mg | ORAL_TABLET | ORAL | Status: DC
  Filled 2018-04-18 (×3): qty 1

## 2018-04-18 MED ORDER — HYDROXYZINE HCL 25 MG PO TABS: 25.0000 mg | ORAL_TABLET | Freq: Four times a day (QID) | ORAL | Status: DC | PRN

## 2018-04-18 MED ORDER — HYDROXYZINE HCL 25 MG PO TABS
25.0000 mg | ORAL_TABLET | Freq: Four times a day (QID) | ORAL | Status: DC | PRN
  Administered 2018-04-18 – 2018-04-19 (×3): 25 mg via ORAL
  Filled 2018-04-18 (×4): qty 1
  Filled 2018-04-18: qty 10

## 2018-04-18 MED ORDER — TRAZODONE HCL 50 MG PO TABS
50.0000 mg | ORAL_TABLET | Freq: Every evening | ORAL | Status: DC | PRN
Start: 2018-04-18 — End: 2018-04-21
  Administered 2018-04-18 – 2018-04-20 (×3): 50 mg via ORAL
  Filled 2018-04-18: qty 1
  Filled 2018-04-18: qty 7
  Filled 2018-04-18 (×2): qty 1

## 2018-04-18 MED ORDER — NICOTINE POLACRILEX 2 MG MT GUM
2.0000 mg | CHEWING_GUM | OROMUCOSAL | Status: DC | PRN
  Administered 2018-04-19 – 2018-04-20 (×5): 2 mg via ORAL
  Filled 2018-04-18 (×3): qty 1

## 2018-04-18 MED ORDER — DICYCLOMINE HCL 20 MG PO TABS: 20.0000 mg | ORAL_TABLET | Freq: Four times a day (QID) | ORAL | Status: DC | PRN

## 2018-04-18 NOTE — H&P (Signed)
Behavioral Health Medical Screening Exam  [name] is an 27 y.o. female.  Total Time spent with patient: 20 minutes  Psychiatric Specialty Exam: Physical Exam  Nursing note and vitals reviewed. Constitutional: She is oriented to person, place, and time. She appears well-developed and well-nourished.  Cardiovascular: Normal rate.  Respiratory: Effort normal.  Musculoskeletal: Normal range of motion.  Neurological: She is alert and oriented to person, place, and time.  Skin: Skin is warm.    Review of Systems  Constitutional: Negative.   HENT: Negative.   Eyes: Negative.   Respiratory: Negative.   Cardiovascular: Negative.   Gastrointestinal: Negative.   Genitourinary: Negative.   Musculoskeletal: Negative.   Skin: Negative.   Neurological: Negative.   Endo/Heme/Allergies: Negative.   Psychiatric/Behavioral: Positive for depression, substance abuse and suicidal ideas. The patient is nervous/anxious.     Blood pressure 114/84, pulse 82, temperature 98.6 F (37 C), SpO2 100 %, unknown if currently breastfeeding.There is no height or weight on file to calculate BMI.  General Appearance: Casual  Eye Contact:  Good  Speech:  Clear and Coherent and Normal Rate  Volume:  Normal  Mood:  Depressed  Affect:  Flat  Thought Process:  Goal Directed and Descriptions of Associations: Intact  Orientation:  Full (Time, Place, and Person)  Thought Content:  WDL  Suicidal Thoughts:  Yes.  with intent/plan  Homicidal Thoughts:  No  Memory:  Immediate;   Good Recent;   Good Remote;   Good  Judgement:  Fair  Insight:  Fair  Psychomotor Activity:  Normal  Concentration: Concentration: Good and Attention Span: Good  Recall:  Good  Fund of Knowledge:Good  Language: Good  Akathisia:  No  Handed:  Right  AIMS (if indicated):     Assets:  Communication Skills Desire for Improvement Financial Resources/Insurance Housing Physical Health Social Support Transportation  Sleep:        Musculoskeletal: Strength & Muscle Tone: within normal limits Gait & Station: normal Patient leans: N/A  Blood pressure 114/84, pulse 82, temperature 98.6 F (37 C), SpO2 100 %, unknown if currently breastfeeding.  Recommendations:  Based on my evaluation the patient does not appear to have an emergency medical condition.  Admit to inpatient for detox and MDD treatement  Holly Bunnellravis B Alim Cattell, FNP 04/18/2018, 5:18 PM
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
## 2018-04-18 NOTE — Tx Team (Signed)
Initial Treatment Plan 04/18/2018 6:45 PM [name] Hoppe RUE:[id number]    PATIENT STRESSORS: Health problems Medication change or noncompliance Substance abuse   PATIENT STRENGTHS: Ability for insight Active sense of humor Communication skills Supportive family/friends   PATIENT IDENTIFIED PROBLEMS: "drug use"  "staying on meds"  Anxiety  Depression  Substance Abuse  Suicidal thoughts           DISCHARGE CRITERIA:  Adequate post-discharge living arrangements Motivation to continue treatment in a less acute level of care Verbal commitment to aftercare and medication compliance  PRELIMINARY DISCHARGE PLAN: Attend aftercare/continuing care group Outpatient therapy Return to previous living arrangement  PATIENT/FAMILY INVOLVEMENT: This treatment plan has been presented to and reviewed with the patient, [name] Howald, and/or family member.  The patient and family have been given the opportunity to ask questions and make suggestions.  Clarene CritchleyElizabeth O Lehi Phifer, RN 04/18/2018, 6:45 PM

## 2018-04-18 NOTE — Progress Notes (Signed)
D   Pt very needy and medication seeking   She reports increased anxiety and depression and requested additional medication so she could go to AA group this evening   She minimizes her problem with drugs and has little insight A    Verbal support given   Medications administered and effectiveness monitored   Q 15 min checks R   Pt is safe at present time and receptive to verbal support

## 2018-04-18 NOTE — Progress Notes (Signed)
Admission Note: Patient is an 27 year old female who presented as a walk in for complain of suicidal thoughts with plan to jump off the parking deck.  Reports using crack cocaine and heroine for months due to worsening depression.  Patient states she is here to get help for her drug use and medication adjustment.  Presents with anxious affect and mood during admission process.  Admission plan of care reviewed and consent signed.  Skin assessment and personal belongings completed.  Skin is dry and intact.  No contraband found.  Verbalizes understanding of admission and unit protocols.  Patient oriented to the unit, staff and room.  Routine safety checks initiated.  Support and encouragement offered as needed.  Patient is safe on the unit.

## 2018-04-19 DIAGNOSIS — G4700 Insomnia, unspecified: Secondary | ICD-10-CM

## 2018-04-19 DIAGNOSIS — Z62810 Personal history of physical and sexual abuse in childhood: Secondary | ICD-10-CM

## 2018-04-19 DIAGNOSIS — R450 Nervousness: Secondary | ICD-10-CM

## 2018-04-19 DIAGNOSIS — Z818 Family history of other mental and behavioral disorders: Secondary | ICD-10-CM

## 2018-04-19 DIAGNOSIS — M2550 Pain in unspecified joint: Secondary | ICD-10-CM

## 2018-04-19 DIAGNOSIS — F1994 Other psychoactive substance use, unspecified with psychoactive substance-induced mood disorder: Secondary | ICD-10-CM

## 2018-04-19 DIAGNOSIS — F332 Major depressive disorder, recurrent severe without psychotic features: Secondary | ICD-10-CM

## 2018-04-19 DIAGNOSIS — F17210 Nicotine dependence, cigarettes, uncomplicated: Secondary | ICD-10-CM

## 2018-04-19 DIAGNOSIS — F4310 Post-traumatic stress disorder, unspecified: Secondary | ICD-10-CM

## 2018-04-19 DIAGNOSIS — F419 Anxiety disorder, unspecified: Secondary | ICD-10-CM

## 2018-04-19 LAB — RAPID URINE DRUG SCREEN, HOSP PERFORMED
Amphetamines: NOT DETECTED
Barbiturates: NOT DETECTED
Benzodiazepines: NOT DETECTED
Cocaine: POSITIVE — AB
Opiates: POSITIVE — AB
TETRAHYDROCANNABINOL: POSITIVE — AB

## 2018-04-19 LAB — CBC
HEMATOCRIT: 37.7 % (ref 36.0–46.0)
HEMOGLOBIN: 12.1 g/dL (ref 12.0–15.0)
MCH: 30.6 pg (ref 26.0–34.0)
MCHC: 32.1 g/dL (ref 30.0–36.0)
MCV: 95.2 fL (ref 78.0–100.0)
Platelets: 320 10*3/uL (ref 150–400)
RBC: 3.96 MIL/uL (ref 3.87–5.11)
RDW: 13.4 % (ref 11.5–15.5)
WBC: 8.4 10*3/uL (ref 4.0–10.5)

## 2018-04-19 LAB — COMPREHENSIVE METABOLIC PANEL
ALBUMIN: 3.7 g/dL (ref 3.5–5.0)
ALK PHOS: 51 U/L (ref 38–126)
ALT: 35 U/L (ref 14–54)
ANION GAP: 10 (ref 5–15)
AST: 26 U/L (ref 15–41)
BILIRUBIN TOTAL: 0.3 mg/dL (ref 0.3–1.2)
BUN: 7 mg/dL (ref 6–20)
CALCIUM: 8.8 mg/dL — AB (ref 8.9–10.3)
CO2: 24 mmol/L (ref 22–32)
CREATININE: 0.83 mg/dL (ref 0.44–1.00)
Chloride: 108 mmol/L (ref 101–111)
GFR calc Af Amer: >60 mL/min (ref >60)
GFR calc non Af Amer: >60 mL/min (ref >60)
GLUCOSE: 97 mg/dL (ref 65–99)
Potassium: 4.1 mmol/L (ref 3.5–5.1)
SODIUM: 142 mmol/L (ref 135–145)
TOTAL PROTEIN: 6.6 g/dL (ref 6.5–8.1)

## 2018-04-19 LAB — TSH: TSH: 1.349 u[IU]/mL (ref 0.350–4.500)

## 2018-04-19 LAB — PREGNANCY, URINE: Preg Test, Ur: NEGATIVE

## 2018-04-19 MED ORDER — GABAPENTIN 300 MG PO CAPS
300.0000 mg | ORAL_CAPSULE | Freq: Three times a day (TID) | ORAL | Status: DC
  Administered 2018-04-19 – 2018-04-21 (×6): 300 mg via ORAL
  Filled 2018-04-19 (×9): qty 1

## 2018-04-19 MED ORDER — NICOTINE POLACRILEX 2 MG MT GUM
2.0000 mg | CHEWING_GUM | OROMUCOSAL | Status: DC | PRN
Start: 2018-04-19 — End: 2018-04-19

## 2018-04-19 MED ORDER — RISPERIDONE 1 MG PO TABS
1.0000 mg | ORAL_TABLET | Freq: Every day | ORAL | Status: DC
  Administered 2018-04-19 – 2018-04-20 (×2): 1 mg via ORAL
  Filled 2018-04-19 (×3): qty 1

## 2018-04-19 MED ORDER — MIRTAZAPINE 15 MG PO TABS
15.0000 mg | ORAL_TABLET | Freq: Every day | ORAL | Status: DC
  Administered 2018-04-19 – 2018-04-20 (×2): 15 mg via ORAL
  Filled 2018-04-19 (×4): qty 1

## 2018-04-19 NOTE — Progress Notes (Signed)
Patient attended AA group meeting tonight.  

## 2018-04-19 NOTE — Progress Notes (Signed)
Recreation Therapy Notes  Animal-Assisted Activity (AAA) Program Checklist/Progress Notes Patient Eligibility Criteria Checklist & Daily Group note for Rec Tx Intervention  Date: 4.23.19 Time: 1430 [address]   AAA/T Program Assumption of Risk Form signed by Patient/ or Parent Legal Guardian   Patient is free of allergies or sever asthma   Patient reports no fear of animals   Patient reports no history of cruelty to animals   Patient understands his/her participation is voluntary   Patient washes hands before animal contact   Patient washes hands after animal contact    Education: Hand Washing, Appropriate Animal Interaction   Education Outcome: Acknowledges understanding/In group clarification offered/Needs additional education.   Clinical Observations/Feedback: Pt did not attend activity.    Caroll RancherMarjette Lumen Brinlee, LRT/CTRS         Caroll RancherLindsay, Charles Niese A 04/19/2018 3:14 PM
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
## 2018-04-19 NOTE — Progress Notes (Signed)
D:Pt has been out in the dayroom this afternoon watching TV. Pt reports that she has mild pain in her legs and refuses prn medication. She has mild sweating and chills. Per report pt refuses morning labs and was in bed much of the morning.  A:Offered support, encouragement and 15 minute checks. Reported pt's refusal of labs to MD and treatment team.  R:Pt denies si and hi. Safety maintained on the unit.

## 2018-04-19 NOTE — [facility] Suicide Risk Assessment (Signed)
[facility] Admission Suicide Risk Assessment   Nursing information obtained from:  Patient Demographic factors:  Adolescent or young adult Current Mental Status:  Self-harm thoughts Loss Factors:  Financial problems / change in socioeconomic status Historical Factors:  Family history of mental illness or substance abuse Risk Reduction Factors:  Sense of responsibility to family  Total Time spent with patient: 1 hour Principal Problem: Substance induced mood disorder (HCC) Diagnosis:   Patient Active Problem List   Diagnosis Date Noted  . Substance induced mood disorder (HCC) [F19.94] 04/19/2018  . MDD (major depressive disorder), severe (HCC) [F32.2] 04/18/2018  . Polysubstance (including opioids) dependence, daily use (HCC) [F19.20] 02/03/2018  . Polysubstance dependence including opioid type drug without complication, episodic abuse (HCC) [F19.20] 12/16/2017  . Major depressive disorder, recurrent severe without psychotic features (HCC) [F33.2] 12/16/2017  . Normal labor [O80, Z37.9] 10/16/2016  . Cigarette smoker [F17.210] 08/18/2016  . 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 Hepatitis C, chronic, maternal, antepartum (HCC) [O98.419, B18.2] 08/04/2016  . Supervision of high-risk pregnancy [O09.90] 07/21/2016  . History of substance abuse [Z87.898] 07/21/2016  . ASCUS with positive high risk HPV [IMO0002] 07/21/2016   Subjective Data:   [name] is a 27 y/o F with history of MDD and polysubstance abuse who was admitted voluntarily after walking in to [facility] with complaints of worsening depression, SI with plan to overdose or jump from a parking structure, relapse of substance use of heroin/cocaine/cannabis, and medication non-adherence. She has recent history of discharge from [facility] on 02/06/18 for similar presentation with plan to attend [facility] for substance use treatment.  Upon initial presentation, pt shares, "I got off my meds  about a month and a half ago, and went into a deep depression. My mom talked me in to coming. I had suicidal thoughts to either OD or do what my brother did." Pt was asked about her experience after her last discharge and she states, "I tried [facility] and I absolutely hated it." Pt notes she left after 3 days. She is not interested in substance use treatment at this time and she explains that she moved in with her mother in [place], [place] which she feels will be a protective factor in preventing her from using again. Pt endorses depression symptoms of depressed mood, anhedonia, guilty feelings, low energy, poor concentration, poor appetite, and suicidal ideations with plan to overdose or jump from a parking structure. She denies symptoms of mania, OCD, and PTSD. She endorses near daily use of cocaine (crack), heroin (via insufflation), and cannabis. She denies other illicit substance use.  Discussed with patient about treatment options. She notes her previous medications were helpful when she was taking them regularly, and she is in agreement to be restarted on previous medications of risperdal, remeron, and gabapentin. She has already been started on opiate withdrawal protcol with clonidine. She was in agreement with the above plan, and she had no further questions, comments, or concerns.   Continued Clinical Symptoms:    The "Alcohol Use Disorders Identification Test", Guidelines for Use in Primary Care, Second Edition.  World Science writer [facility]). Score between 0-7:  no or low risk or alcohol related problems. Score between 8-15:  moderate risk of alcohol related problems. Score between 16-19:  high risk of alcohol related problems. Score 20 or above:  warrants further diagnostic evaluation for alcohol dependence and treatment.   CLINICAL FACTORS:   Severe Anxiety and/or Agitation Depression:   Comorbid alcohol abuse/dependence Alcohol/Substance  Abuse/Dependencies More than one psychiatric  diagnosis Unstable or Poor Therapeutic Relationship Previous Psychiatric Diagnoses and Treatments   Musculoskeletal: Strength & Muscle Tone: within normal limits Gait & Station: normal Patient leans: N/A  Psychiatric Specialty Exam: Physical Exam  Nursing note and vitals reviewed.   Review of Systems  Constitutional: Negative for chills and fever.  Respiratory: Negative for cough and shortness of breath.   Cardiovascular: Negative for chest pain.  Gastrointestinal: Negative for abdominal pain, heartburn, nausea and vomiting.  Psychiatric/Behavioral: Positive for depression, substance abuse and suicidal ideas. Negative for hallucinations. The patient is nervous/anxious. The patient does not have insomnia.     Blood pressure 107/81, pulse (!) 48, temperature 98.2 F (36.8 C), temperature source Oral, resp. rate 18, height 5\' 1"  (1.549 m), weight 49 kg (108 lb), last menstrual period 04/16/2018, SpO2 98 %, not currently breastfeeding.Body mass index is 20.41 kg/m.  General Appearance: Casual and Fairly Groomed  Eye Contact:  Good  Speech:  Clear and Coherent and Normal Rate  Volume:  Normal  Mood:  Anxious, Depressed and Irritable  Affect:  Congruent and Constricted  Thought Process:  Coherent and Goal Directed  Orientation:  Full (Time, Place, and Person)  Thought Content:  Logical  Suicidal Thoughts:  Yes.  with intent/plan  Homicidal Thoughts:  No  Memory:  Immediate;   Fair Recent;   Fair Remote;   Fair  Judgement:  Poor  Insight:  Lacking  Psychomotor Activity:  Normal  Concentration:  Concentration: Fair  Recall:  [employer]  Fund of Knowledge:  Fair  Language:  Fair  Akathisia:  No  Handed:    AIMS (if indicated):     Assets:  Desire for Improvement Housing Physical Health Resilience Social Support  ADL's:  Intact  Cognition:  WNL  Sleep:  Number of Hours: 6.75      COGNITIVE FEATURES THAT CONTRIBUTE TO RISK:  None    SUICIDE RISK:   Severe:  Frequent,  intense, and enduring suicidal ideation, specific plan, no subjective intent, but some objective markers of intent (i.e., choice of lethal method), the method is accessible, some limited preparatory behavior, evidence of impaired self-control, severe dysphoria/symptomatology, multiple risk factors present, and few if any protective factors, particularly a lack of social support.  PLAN OF CARE:   -Admit to inpatient level of care  -MDD, recurrent, severe   -Restart remeron 15mg  po qhs   -Restart risperdal 1mg  po qhs  -Anxiety   -Restart gabapentin 300mg  po TID   - Start vistaril 25mg  po q6h prn anxiety  -Insomnia   -Start trazodone 50mg  po qhs prn insomnia  -Polysubstance dependence, including opioid use     -Continue COWS with clonidine  -Encourage participation in groups and therapeutic milieu  -Disposition planning will be ongoing  I certify that inpatient services furnished can reasonably be expected to improve the patient's condition.   Micheal Likenshristopher T Rector Devonshire, MD 04/19/2018, 3:45 PM

## 2018-04-19 NOTE — [facility] Suicide Risk Assessment (Signed)
[facility] INPATIENT:  Family/Significant Other Suicide Prevention Education  Suicide Prevention Education:  Patient Refusal for Family/Significant Other Suicide Prevention Education: The patient [name] has refused to provide written consent for family/significant other to be provided Family/Significant Other Suicide Prevention Education during admission and/or prior to discharge.  Physician notified.  SPE completed with patient, as patient refused to consent to family contact. SPI pamphlet provided to pt and pt was encouraged to share information with support network, ask questions, and talk about any concerns relating to SPE. Patient denies access to guns/firearms and verbalized understanding of information provided. Mobile Crisis information also provided to patient.   [name] 04/19/2018, 9:44 AM

## 2018-04-19 NOTE — [facility] Group Notes (Signed)
LCSW Group Therapy Note 04/19/2018 1:57 PM  Type of Therapy/Topic: Group Therapy: Feelings about Diagnosis  Participation Level: Active   Description of Group:  This group will allow patients to explore their thoughts and feelings about diagnoses they have received. Patients will be guided to explore their level of understanding and acceptance of these diagnoses. Facilitator will encourage patients to process their thoughts and feelings about the reactions of others to their diagnosis and will guide patients in identifying ways to discuss their diagnosis with significant others in their lives. This group will be process-oriented, with patients participating in exploration of their own experiences, giving and receiving support, and processing challenge from other group members.  Therapeutic Goals: 1. Patient will demonstrate understanding of diagnosis as evidenced by identifying two or more symptoms of the disorder 2. Patient will be able to express two feelings regarding the diagnosis 3. Patient will demonstrate their ability to communicate their needs through discussion and/or role play  Summary of Patient Progress:   [name] was engaged and participated throughout the group session. She reports that she feels that her diagnosis has effected her life in many ways she can describe. [name] reports that she plans on staying clean so she can be in children's lives and also plans on being more compliant with her medications. [name] states that her main problem is procrastination.     Therapeutic Modalities:  Cognitive Behavioral Therapy Brief Therapy Feelings Identification    [name] LCSWA Clinical Social Worker

## 2018-04-19 NOTE — Progress Notes (Signed)
D   Pt is less  needy than yesterday and reports improvement of her symptoms   She reports increased anxiety and depression and she interacts well with others    She minimizes her problem with drugs and has little insight A    Verbal support given   Medications administered and effectiveness monitored   Q 15 min checks R   Pt is safe at present time and receptive to verbal support

## 2018-04-19 NOTE — [facility] Group Notes (Signed)
[facility] Group Notes:  (Nursing/MHT/Case Management/Adjunct)  Date:  04/19/2018  Time:  8:52 AM  Type of Therapy:  Nurse Education  Participation Level:  Did Not Attend  Participation Quality:    Affect:    Cognitive:    Insight:    Engagement in Group:    Modes of Intervention:    Summary of Progress/Problems: Patient did not attend, patient sleeping  Kirstie MirzaJonathan C Khiry Pasquariello 04/19/2018, 8:52 AM
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
## 2018-04-20 NOTE — Progress Notes (Signed)
[facility] MD Progress Note  04/20/2018 9:46 AM JOWANDA HEEG  MRN:  [id number] Subjective:    [name] is a 27 y/o F with history of MDD and polysubstance abuse who was admitted voluntarily after walking in to [facility] with complaints of worsening depression, SI with plan to overdose or jump from a parking structure, relapse of substance use of heroin/cocaine/cannabis, and medication non-adherence. She has recent history of discharge from [facility] on 02/06/18 for similar presentation with plan to attend [facility] for substance use treatment. Pt was restarted on previous discharge medications of risperdal, remeron, and gabapentin. She has been reporting incremental improvement of her presenting symptoms.  Today upon evaluation, pt shares, "I'm alright - feeling a lot better." Pt denies any specific concerns. She is sleeping well. Her appetite is good. She denies other physical complaints. She denies SI/HI/AH/VH. She is tolerating her medications without difficulty or side effects. She feels that they have been helpful. Her plan remains to go stay with her mother-in-law after discharge, which she feels is a good plan to maintain sobriety as she is away from social influences which encourage her to use. She declines for referral to substance use treatment. She agrees to continue her current treatment regimen without changes. She had no further questions, comments, or concerns.  Principal Problem: Substance induced mood disorder ([place]) Diagnosis:   Patient Active Problem List   Diagnosis Date Noted  . Substance induced mood disorder ([place]) [F19.94] 04/19/2018  . Polysubstance (including opioids) dependence, daily use ([place]) [F19.20] 02/03/2018  . Polysubstance dependence including opioid type drug without complication, episodic abuse ([place]) [F19.20] 12/16/2017  . Major depressive disorder, recurrent severe without psychotic features ([place]) [F33.2] 12/16/2017  . Normal labor [O80, Z37.9] 10/16/2016  . Cigarette smoker  [F17.210] 08/18/2016  . Pregnancy complicated by subutex maintenance, antepartum ([place]) [O99.320, F11.20] 08/04/2016  . Hepatitis C, chronic, maternal, antepartum ([place]) [O98.419, B18.2] 08/04/2016  . Supervision of high-risk pregnancy [O09.90] 07/21/2016  . History of substance abuse [Z87.898] 07/21/2016  . ASCUS with positive high risk HPV [IMO0002] 07/21/2016   Total Time spent with patient: 30 minutes  Past Psychiatric History: See H&P  Past Medical History:  Past Medical History:  Diagnosis Date  . History of RPR test 07/21/2016   02/2016: 1:1 with negative TPA testing '[ ]'  f/u 28wks '[ ]'  f/u admit RPR '[ ]'  f/u PP RPR  . History of self-harm   . Polysubstance abuse [facility])     Past Surgical History:  Procedure Laterality Date  . TONSILLECTOMY     Family History:  Family History  Problem Relation Age of Onset  . Vision loss Mother   . Depression Mother   . Ulcers Mother   . Gallbladder disease Mother   . Mental illness Mother   . Thyroid disease Mother   . Allergies Father   . Vision loss Father   . Vision loss Maternal Grandmother   . Ulcers Maternal Grandmother   . Gallbladder disease Maternal Grandmother   . Diabetes Maternal Grandmother   . Vision loss Maternal Grandfather   . Heart disease Maternal Grandfather   . Cancer Maternal Grandfather   . Allergies Paternal Grandmother   . Vision loss Paternal Grandmother   . Gallbladder disease Paternal Grandmother   . Arthritis Paternal Grandmother   . Cancer Paternal Grandmother   . Allergies Paternal Grandfather   . Vision loss Paternal Grandfather    Family Psychiatric  History: See H&P Social History:  Social History   Substance and  Sexual Activity  Alcohol Use No     Social History   Substance and Sexual Activity  Drug Use Yes  . Frequency: 5.0 times per week  . Types: Cocaine, Marijuana, Methamphetamines   Comment: Heroin    Social History   Socioeconomic History  . Marital status: Single    Spouse  name: Not on file  . Number of children: Not on file  . Years of education: Not on file  . Highest education level: Not on file  Occupational History  . Not on file  Social Needs  . Financial resource strain: Not on file  . Food insecurity:    Worry: Not on file    Inability: Not on file  . Transportation needs:    Medical: Not on file    Non-medical: Not on file  Tobacco Use  . Smoking status: Current Every Day Smoker    Packs/day: 0.00    Types: Cigarettes  . Smokeless tobacco: Never Used  Substance and Sexual Activity  . Alcohol use: No  . Drug use: Yes    Frequency: 5.0 times per week    Types: Cocaine, Marijuana, Methamphetamines    Comment: Heroin  . Sexual activity: Yes    Partners: Male  Lifestyle  . Physical activity:    Days per week: Not on file    Minutes per session: Not on file  . Stress: Not on file  Relationships  . Social connections:    Talks on phone: Not on file    Gets together: Not on file    Attends religious service: Not on file    Active member of club or organization: Not on file    Attends meetings of clubs or organizations: Not on file    Relationship status: Not on file  Other Topics Concern  . Not on file  Social History Narrative  . Not on file   Additional Social History:    Pain Medications: Please see MAR Prescriptions: Please see MAR Over the Counter: Please see MAR History of alcohol / drug use?: Yes Longest period of sobriety (when/how long): Unknown Name of Substance 1: Heroine 1 - Age of First Use: 19 1 - Amount (size/oz): 1/2 gram + 1 - Frequency: Daily 1 - Duration: Unknown 1 - Last Use / Amount: 04/17/18 Name of Substance 2: Crack Cocaine 2 - Age of First Use: 17 2 - Amount (size/oz): 1/2 gram 2 - Frequency: Daily 2 - Duration: Unknown 2 - Last Use / Amount: 04/17/18                Sleep: Good  Appetite:  Good  Current Medications: Current Facility-Administered Medications  Medication Dose Route  Frequency Provider Last Rate Last Dose  . acetaminophen (TYLENOL) tablet 650 mg  650 mg Oral Q6H PRN Patrecia Pour, NP      . alum & mag hydroxide-simeth (MAALOX/MYLANTA) 200-200-20 MG/5ML suspension 30 mL  30 mL Oral Q4H PRN Patrecia Pour, NP      . cloNIDine (CATAPRES) tablet 0.1 mg  0.1 mg Oral QID Money, Darnelle Maffucci B, FNP   0.1 mg at 04/20/18 0804   Followed by  . [START ON 04/21/2018] cloNIDine (CATAPRES) tablet 0.1 mg  0.1 mg Oral BH-qamhs Money, Lowry Ram, FNP       Followed by  . [START ON 04/23/2018] cloNIDine (CATAPRES) tablet 0.1 mg  0.1 mg Oral QAC breakfast Money, Darnelle Maffucci B, FNP      . dicyclomine (BENTYL) tablet 20 mg  20 mg Oral Q6H PRN Money, Lowry Ram, FNP      . gabapentin (NEURONTIN) capsule 300 mg  300 mg Oral TID Lindell Spar I, NP   300 mg at 04/20/18 0804  . hydrOXYzine (ATARAX/VISTARIL) tablet 25 mg  25 mg Oral Q6H PRN Laverle Hobby, PA-C   25 mg at 04/19/18 2126  . loperamide (IMODIUM) capsule 2-4 mg  2-4 mg Oral PRN Money, Lowry Ram, FNP   2 mg at 04/19/18 1055  . magnesium hydroxide (MILK OF MAGNESIA) suspension 30 mL  30 mL Oral Daily PRN Patrecia Pour, NP      . methocarbamol (ROBAXIN) tablet 500 mg  500 mg Oral Q8H PRN Money, Lowry Ram, FNP   500 mg at 04/19/18 2126  . mirtazapine (REMERON) tablet 15 mg  15 mg Oral QHS Nwoko, Agnes I, NP   15 mg at 04/19/18 2126  . naproxen (NAPROSYN) tablet 500 mg  500 mg Oral BID PRN Money, Lowry Ram, FNP   500 mg at 04/18/18 1838  . nicotine polacrilex (NICORETTE) gum 2 mg  2 mg Oral PRN Cobos, Myer Peer, MD   2 mg at 04/19/18 2125  . ondansetron (ZOFRAN-ODT) disintegrating tablet 4 mg  4 mg Oral Q6H PRN Money, Lowry Ram, FNP      . risperiDONE (RISPERDAL) tablet 1 mg  1 mg Oral QHS Nwoko, Agnes I, NP   1 mg at 04/19/18 2126  . traZODone (DESYREL) tablet 50 mg  50 mg Oral QHS PRN Money, Lowry Ram, FNP   50 mg at 04/19/18 2126    Lab Results:  Results for orders placed or performed during the hospital encounter of 04/18/18 (from the  past 48 hour(s))  Pregnancy, urine     Status: None   Collection Time: 04/18/18  5:23 PM  Result Value Ref Range   Preg Test, Ur NEGATIVE NEGATIVE    Comment:        THE SENSITIVITY OF THIS METHODOLOGY IS >20 mIU/mL. Performed at [facility], [place] [address] [place], [place] [address]   Urine rapid drug screen (hosp performed)not at [facility]     Status: Abnormal   Collection Time: 04/18/18  5:23 PM  Result Value Ref Range   Opiates POSITIVE (A) NONE DETECTED   Cocaine POSITIVE (A) NONE DETECTED   Benzodiazepines NONE DETECTED NONE DETECTED   Amphetamines NONE DETECTED NONE DETECTED   Tetrahydrocannabinol POSITIVE (A) NONE DETECTED   Barbiturates NONE DETECTED NONE DETECTED    Comment: (NOTE) DRUG SCREEN FOR MEDICAL PURPOSES ONLY.  IF CONFIRMATION IS NEEDED FOR ANY PURPOSE, NOTIFY LAB WITHIN 5 DAYS. LOWEST DETECTABLE LIMITS FOR URINE DRUG SCREEN Drug Class                     Cutoff (ng/mL) Amphetamine and metabolites    1000 Barbiturate and metabolites    200 Benzodiazepine                 103 Tricyclics and metabolites     300 Opiates and metabolites        300 Cocaine and metabolites        300 THC                            50 Performed at [facility], [place] [address] [place], [place] [address]   CBC     Status: None   Collection Time: 04/19/18  6:34 PM  Result Value Ref Range   WBC 8.4 4.0 - 10.5 K/uL   RBC 3.96 3.87 - 5.11 MIL/uL   Hemoglobin 12.1 12.0 - 15.0 g/dL   HCT 37.7 36.0 - 46.0 %   MCV 95.2 78.0 - 100.0 fL   MCH 30.6 26.0 - 34.0 pg   MCHC 32.1 30.0 - 36.0 g/dL   RDW 13.4 11.5 - 15.5 %   Platelets 320 150 - 400 K/uL    Comment: Performed at [facility], [place] [address] [place], [place] [address]  Comprehensive metabolic panel     Status: Abnormal   Collection Time: 04/19/18  6:34 PM  Result Value Ref Range   Sodium 142 135 - 145 mmol/L   Potassium 4.1 3.5 - 5.1 mmol/L   Chloride 108 101 - 111  mmol/L   CO2 24 22 - 32 mmol/L   Glucose, Bld 97 65 - 99 mg/dL   BUN 7 6 - 20 mg/dL   Creatinine, Ser 0.83 0.44 - 1.00 mg/dL   Calcium 8.8 (L) 8.9 - 10.3 mg/dL   Total Protein 6.6 6.5 - 8.1 g/dL   Albumin 3.7 3.5 - 5.0 g/dL   AST 26 15 - 41 U/L   ALT 35 14 - 54 U/L   Alkaline Phosphatase 51 38 - 126 U/L   Total Bilirubin 0.3 0.3 - 1.2 mg/dL   GFR calc non Af Amer >60 >60 mL/min   GFR calc Af Amer >60 >60 mL/min    Comment: (NOTE) The eGFR has been calculated using the CKD EPI equation. This calculation has not been validated in all clinical situations. eGFR's persistently <60 mL/min signify possible Chronic Kidney Disease.    Anion gap 10 5 - 15    Comment: Performed at [facility], [place] [address], [place], [place] [address]  TSH     Status: None   Collection Time: 04/19/18  6:34 PM  Result Value Ref Range   TSH 1.349 0.350 - 4.500 uIU/mL    Comment: Performed by a 3rd Generation assay with a functional sensitivity of <=0.01 uIU/mL. Performed at [facility], [place] [address] [place], [place] [address]     Blood Alcohol level:  Lab Results  Component Value Date   ETH <10 02/02/2018   ETH <10 48/25/0037    Metabolic Disorder Labs: No results found for: HGBA1C, MPG No results found for: PROLACTIN No results found for: CHOL, TRIG, HDL, CHOLHDL, VLDL, LDLCALC  Physical Findings: AIMS: Facial and Oral Movements Muscles of Facial Expression: None, normal Lips and Perioral Area: None, normal Jaw: None, normal Tongue: None, normal,Extremity Movements Upper (arms, wrists, hands, fingers): None, normal Lower (legs, knees, ankles, toes): None, normal, Trunk Movements Neck, shoulders, hips: None, normal, Overall Severity Severity of abnormal movements (highest score from questions above): None, normal Incapacitation due to abnormal movements: None, normal Patient's awareness of abnormal movements (rate only patient's report): No  Awareness, Dental Status Current problems with teeth and/or dentures?: No Does patient usually wear dentures?: No  CIWA:  CIWA-Ar Total: 2 COWS:  COWS Total Score: 3  Musculoskeletal: Strength & Muscle Tone: within normal limits Gait & Station: normal Patient leans: N/A  Psychiatric Specialty Exam: Physical Exam  Nursing note and vitals reviewed.   Review of Systems  Constitutional: Negative for chills and fever.  Respiratory: Negative for cough and shortness of breath.   Cardiovascular: Negative for chest pain.  Gastrointestinal: Negative for abdominal pain, heartburn, nausea and vomiting.  Psychiatric/Behavioral: Negative for depression, hallucinations and suicidal ideas. The patient is not nervous/anxious and does not have insomnia.     Blood pressure 106/60, pulse (!) 128, temperature 98.1 F (36.7 C), temperature source Oral, resp. rate 16, height '5\' 1"'  (1.549 m), weight 49 kg (108 lb), last menstrual period 04/16/2018, SpO2 98 %, not currently breastfeeding.Body mass index is 20.41 kg/m.  General Appearance: Casual and Fairly Groomed  Eye Contact:  Good  Speech:  Clear and Coherent and Normal Rate  Volume:  Normal  Mood:  Euthymic  Affect:  Appropriate, Congruent and Constricted  Thought Process:  Coherent and Goal Directed  Orientation:  Full (Time, Place, and Person)  Thought Content:  Logical  Suicidal Thoughts:  No  Homicidal Thoughts:  No  Memory:  Immediate;   Fair Recent;   Fair Remote;   Fair  Judgement:  Fair  Insight:  Lacking  Psychomotor Activity:  Normal  Concentration:  Concentration: Fair  Recall:  [employer] of Knowledge:  Fair  Language:  Fair  Akathisia:  No  Handed:    AIMS (if indicated):     Assets:  [employer]ed forces logistics/support/administrative officer Physical Health Resilience  ADL's:  Intact  Cognition:  WNL  Sleep:  Number of Hours: 6.75   Treatment Plan Summary: Daily contact with patient to assess and evaluate symptoms and progress in treatment and Medication  management   -Continue inpatient hospitalization  -MDD, recurrent, severe             -Continue remeron 23m po qhs             -Continue risperdal 173mpo qhs  -Anxiety              -Continue gabapentin 30068mo TID             - Continue vistaril 58m42m q6h prn anxiety  -Insomnia              -Continue trazodone 50mg15mqhs prn insomnia  -Polysubstance dependence, including opioid use             -Continue COWS with clonidine  -Encourage participation in groups and therapeutic milieu  -Disposition planning will be ongoing  ChrisPennelope Bracken4/24/2019, 9:46 AM

## 2018-04-20 NOTE — BHH Group Notes (Signed)
LCSW Group Therapy Note 04/20/2018 2:16 PM  Type of Therapy/Topic: Group Therapy: Emotion Regulation  Participation Level: Active   Description of Group:  The purpose of this group is to assist patients in learning to regulate negative emotions and experience positive emotions. Patients will be guided to discuss ways in which they have been vulnerable to their negative emotions. These vulnerabilities will be juxtaposed with experiences of positive emotions or situations, and patients will be challenged to use positive emotions to combat negative ones. Special emphasis will be placed on coping with negative emotions in conflict situations, and patients will process healthy conflict resolution skills.  Therapeutic Goals: 1. Patient will identify two positive emotions or experiences to reflect on in order to balance out negative emotions 2. Patient will label two or more emotions that they find the most difficult to experience 3. Patient will demonstrate positive conflict resolution skills through discussion and/or role plays  Summary of Patient Progress:  [name] was engaged and participated throughout the group session. [name] states that the emotion she struggles regulating is sadness. [name] reports she does not know why she battles with sadness. [name] states that she believes her inability to regulate her emotions have contributed to her drug usage. [name] reports that she plans to comply with her medications so that she can work on her road to recovery.   Therapeutic Modalities:  Cognitive Behavioral Therapy Feelings Identification Dialectical Behavioral Therapy   Alcario DroughtJolan Traivon Morrical LCSWA Clinical Social Worker

## 2018-04-20 NOTE — Progress Notes (Signed)
Recreation Therapy Notes  Date: 4.24.19 Time: 0930 Location: [address] Dayroom  Group Topic: Stress Management  Goal Area(s) Addresses:  Patient will verbalize importance of using healthy stress management.  Patient will identify positive emotions associated with healthy stress management.   Intervention: Stress Management  Activity :  Guided Imagery.  LRT introduced the stress management technique of guided imagery.  LRT read [name] script leading patients on [name] journey to the beach.  Patients were to listen and follow along as script was read.  Education:  Stress Management, Discharge Planning.   Education Outcome: Acknowledges edcuation/In group clarification offered/Needs additional education  Clinical Observations/Feedback: Pt did not attend group.    [name], LRT/CTRS         [name] 04/20/2018 12:22 PM 

## 2018-04-20 NOTE — Progress Notes (Signed)
Patient ID: [name], female   DOB: [birth year], 27 y.o.   MRN: [id number]  D: Patient with blunted affect and depressed mood, denies SI/HI/AVH, reports a fair sleep quality last night, a good appetite, a normal energy level and a good concentration level.  Pt rates her depression as "0" (10 being the worst), her hopelessness level as "0" (10 being the worst), and anxiety level as "0" (10 being the worst).  Pt reports that what is most important for her today is "getting better", and stated that she planned to go to groups and to rest.    Pt was reclusive to her room at start of shift and needed multiple positive reinforcements to go to the medication window for her meds.  B/P at start of shift was 97/74, HR-116.  It was repeated and was 109/60, HR-128.  Pt was medicated with scheduled dose of Clonidine 0.1mg  and Neurontin 300mg .  Pt was educated and encouraged to increase her fluid intake, and verbalized understanding.  12pm dose of Clonidine was held due to her HR of 51.  Pt was again educated to increase fluids and verbalized understanding.  A: Pt being medicated with all meds as scheduled, and Q15 minute checks are being maintained for safety.  R: Will continue to monitor and address any concerns as needed.

## 2018-04-20 NOTE — Tx Team (Signed)
Interdisciplinary Treatment and Diagnostic Plan Update  04/20/2018 Time of Session: 9:15am  [name] MRN: [id number]  Principal Diagnosis: Substance induced mood disorder ([place])  Secondary Diagnoses: Principal Problem:   Substance induced mood disorder ([place]) Active Problems:   Polysubstance dependence including opioid type drug without complication, episodic abuse ([place])   Major depressive disorder, recurrent severe without psychotic features ([place])   Current Medications:  Current Facility-Administered Medications  Medication Dose Route Frequency Provider Last Rate Last Dose  . acetaminophen (TYLENOL) tablet 650 mg  650 mg Oral Q6H PRN Patrecia Pour, NP      . alum & mag hydroxide-simeth (MAALOX/MYLANTA) 200-200-20 MG/5ML suspension 30 mL  30 mL Oral Q4H PRN Patrecia Pour, NP      . cloNIDine (CATAPRES) tablet 0.1 mg  0.1 mg Oral QID Money, Darnelle Maffucci B, FNP   0.1 mg at 04/20/18 0804   Followed by  . [START ON 04/21/2018] cloNIDine (CATAPRES) tablet 0.1 mg  0.1 mg Oral BH-qamhs Money, Lowry Ram, FNP       Followed by  . [START ON 04/23/2018] cloNIDine (CATAPRES) tablet 0.1 mg  0.1 mg Oral QAC breakfast Money, Lowry Ram, FNP      . dicyclomine (BENTYL) tablet 20 mg  20 mg Oral Q6H PRN Money, Lowry Ram, FNP      . gabapentin (NEURONTIN) capsule 300 mg  300 mg Oral TID Lindell Spar I, NP   300 mg at 04/20/18 0804  . hydrOXYzine (ATARAX/VISTARIL) tablet 25 mg  25 mg Oral Q6H PRN Laverle Hobby, PA-C   25 mg at 04/19/18 2126  . loperamide (IMODIUM) capsule 2-4 mg  2-4 mg Oral PRN Money, Lowry Ram, FNP   2 mg at 04/19/18 1055  . magnesium hydroxide (MILK OF MAGNESIA) suspension 30 mL  30 mL Oral Daily PRN Patrecia Pour, NP      . methocarbamol (ROBAXIN) tablet 500 mg  500 mg Oral Q8H PRN Money, Lowry Ram, FNP   500 mg at 04/19/18 2126  . mirtazapine (REMERON) tablet 15 mg  15 mg Oral QHS Nwoko, Agnes I, NP   15 mg at 04/19/18 2126  . naproxen (NAPROSYN) tablet 500 mg  500 mg Oral BID PRN  Money, Lowry Ram, FNP   500 mg at 04/18/18 1838  . nicotine polacrilex (NICORETTE) gum 2 mg  2 mg Oral PRN Cobos, Myer Peer, MD   2 mg at 04/19/18 2125  . ondansetron (ZOFRAN-ODT) disintegrating tablet 4 mg  4 mg Oral Q6H PRN Money, Lowry Ram, FNP      . risperiDONE (RISPERDAL) tablet 1 mg  1 mg Oral QHS Nwoko, Agnes I, NP   1 mg at 04/19/18 2126  . traZODone (DESYREL) tablet 50 mg  50 mg Oral QHS PRN Money, Lowry Ram, FNP   50 mg at 04/19/18 2126   PTA Medications: Medications Prior to Admission  Medication Sig Dispense Refill Last Dose  . gabapentin (NEURONTIN) 300 MG capsule Take 1 capsule (300 mg total) by mouth 3 (three) times daily. 180 capsule 0   . mirtazapine (REMERON) 15 MG tablet Take 1 tablet (15 mg total) by mouth at bedtime. 30 tablet 0   . risperiDONE (RISPERDAL) 1 MG tablet Take 1 tablet (1 mg total) by mouth at bedtime. 30 tablet 0     Patient Stressors: Health problems Medication change or noncompliance Substance abuse  Patient Strengths: Ability for insight Active sense of humor Communication skills Supportive family/friends  Treatment Modalities: Medication Management, Group  therapy, Case management,  1 to 1 session with clinician, Psychoeducation, Recreational therapy.   Physician Treatment Plan for Primary Diagnosis: Substance induced mood disorder ([place]) Long Term Goal(s): Improvement in symptoms so as ready for discharge Improvement in symptoms so as ready for discharge   Short Term Goals: Ability to identify changes in lifestyle to reduce recurrence of condition will improve Ability to demonstrate self-control will improve Ability to identify and develop effective coping behaviors will improve Compliance with prescribed medications will improve Ability to identify triggers associated with substance abuse/mental health issues will improve  Medication Management: Evaluate patient's response, side effects, and tolerance of medication regimen.  Therapeutic  Interventions: 1 to 1 sessions, Unit Group sessions and Medication administration.  Evaluation of Outcomes: Not Met  Physician Treatment Plan for Secondary Diagnosis: Principal Problem:   Substance induced mood disorder ([place]) Active Problems:   Polysubstance dependence including opioid type drug without complication, episodic abuse ([place])   Major depressive disorder, recurrent severe without psychotic features ([place])  Long Term Goal(s): Improvement in symptoms so as ready for discharge Improvement in symptoms so as ready for discharge   Short Term Goals: Ability to identify changes in lifestyle to reduce recurrence of condition will improve Ability to demonstrate self-control will improve Ability to identify and develop effective coping behaviors will improve Compliance with prescribed medications will improve Ability to identify triggers associated with substance abuse/mental health issues will improve     Medication Management: Evaluate patient's response, side effects, and tolerance of medication regimen.  Therapeutic Interventions: 1 to 1 sessions, Unit Group sessions and Medication administration.  Evaluation of Outcomes: Not Met   RN Treatment Plan for Primary Diagnosis: Substance induced mood disorder ([place]) Long Term Goal(s): Knowledge of disease and therapeutic regimen to maintain health will improve  Short Term Goals: Ability to remain free from injury will improve, Ability to verbalize frustration and anger appropriately will improve, Ability to demonstrate self-control, Ability to participate in decision making will improve, Ability to verbalize feelings will improve, Ability to disclose and discuss suicidal ideas, Ability to identify and develop effective coping behaviors will improve and Compliance with prescribed medications will improve  Medication Management: RN will administer medications as ordered by provider, will assess and evaluate patient's response and provide  education to patient for prescribed medication. RN will report any adverse and/or side effects to prescribing provider.  Therapeutic Interventions: 1 on 1 counseling sessions, Psychoeducation, Medication administration, Evaluate responses to treatment, Monitor vital signs and CBGs as ordered, Perform/monitor CIWA, COWS, AIMS and Fall Risk screenings as ordered, Perform wound care treatments as ordered.  Evaluation of Outcomes: Not Met   LCSW Treatment Plan for Primary Diagnosis: Substance induced mood disorder ([place]) Long Term Goal(s): Safe transition to appropriate next level of care at discharge, Engage patient in therapeutic group addressing interpersonal concerns.  Short Term Goals: Engage patient in aftercare planning with referrals and resources, Increase social support, Increase ability to appropriately verbalize feelings, Increase emotional regulation, Facilitate acceptance of mental health diagnosis and concerns, Facilitate patient progression through stages of change regarding substance use diagnoses and concerns, Identify triggers associated with mental health/substance abuse issues and Increase skills for wellness and recovery  Therapeutic Interventions: Assess for all discharge needs, 1 to 1 time with Social worker, Explore available resources and support systems, Assess for adequacy in community support network, Educate family and significant other(s) on suicide prevention, Complete Psychosocial Assessment, Interpersonal group therapy.  Evaluation of Outcomes: Not Met   Progress in Treatment: Attending groups:  Yes. Participating in groups: Yes. Taking medication as prescribed: Yes. Toleration medication: Yes. Family/Significant other contact made: No, will contact:  patient refusing consent at this time Patient understands diagnosis: Yes. Discussing patient identified problems/goals with staff: Yes. Medical problems stabilized or resolved: Yes. Denies suicidal/homicidal  ideation: Yes. Issues/concerns per patient self-inventory: No. Other:  New problem(s) identified: None  New Short Term/Long Term Goal(s):Detox, medication stabilization, elimination of SI thoughts, development of comprehensive mental wellness plan.   Patient Goals: "get back on meds and move to another city"  Discharge Plan or Barriers: Patient plans to return home with her father in [place], [place] and follow up with [facility] for outpatient medication management and therapy services.    Reason for Continuation of Hospitalization: Depression Medication stabilization Withdrawal symptoms  Estimated Length of Stay: Friday, 04/22/18  Attendees: Patient: [name] 04/20/2018 8:35 AM  Physician: Dr. Maris Berger, MD 04/20/2018 8:35 AM  Nursing: Tamela Oddi. Delane Ginger, RN 04/20/2018 8:35 AM  RN Care Manager: Rhunette Croft 04/20/2018 8:35 AM  Social Worker: Radonna Ricker, [place] 04/20/2018 8:35 AM  Recreational Therapist: X 04/20/2018 8:35 AM  Other: X 04/20/2018 8:35 AM  Other: X 04/20/2018 8:35 AM  Other: Rhunette Croft 04/20/2018 8:35 AM    Scribe for Treatment Team: Marylee Floras, [place] 04/20/2018 8:35 AM

## 2018-04-20 NOTE — Progress Notes (Signed)
Patient ID: [name], female   DOB: 1991/08/03, 27 y.o.   MRN: [id number] Patient's B/P 95/64, 1700 dose of Clonidine 0.1mg  held.

## 2018-04-21 MED ORDER — HYDROXYZINE HCL 25 MG PO TABS: 25.0000 mg | ORAL_TABLET | Freq: Four times a day (QID) | ORAL | 0 refills | Status: DC | PRN

## 2018-04-21 MED ORDER — TRAZODONE HCL 50 MG PO TABS
50.0000 mg | ORAL_TABLET | Freq: Every evening | ORAL | 0 refills | Status: DC | PRN
Start: 2018-04-21 — End: 2018-07-13

## 2018-04-21 MED ORDER — RISPERIDONE 1 MG PO TABS: 1.0000 mg | ORAL_TABLET | Freq: Every day | ORAL | 0 refills | Status: DC

## 2018-04-21 MED ORDER — MIRTAZAPINE 15 MG PO TABS
15.0000 mg | ORAL_TABLET | Freq: Every day | ORAL | 0 refills | Status: DC
Start: 2018-04-21 — End: 2018-07-13

## 2018-04-21 MED ORDER — NICOTINE POLACRILEX 2 MG MT GUM
2.0000 mg | CHEWING_GUM | OROMUCOSAL | 0 refills | Status: DC | PRN
Start: 2018-04-21 — End: 2019-10-26

## 2018-04-21 MED ORDER — GABAPENTIN 300 MG PO CAPS: 300.0000 mg | ORAL_CAPSULE | Freq: Three times a day (TID) | ORAL | 0 refills | Status: DC

## 2018-04-21 NOTE — Progress Notes (Addendum)
Patient ID: [name], female   DOB: [birth year], 27 y.o.   MRN: [id number]  Pt currently presents with an anxious affect and behavior. Pt reports to writer that their goal is to "a longer term treatment center." Pt states "my husband will be there too." Interacts positively with peers. Pt reports good sleep with current medication regimen. Pt refused her withdrawal medication, states "I don't need it, Im better."  Pt provided with medications per providers orders. Pt's labs and vitals were monitored throughout the night. Pt given a 1:1 about emotional and mental status. Pt supported and encouraged to express concerns and questions. Pt educated on medications.  Pt's safety ensured with 15 minute and environmental checks. Pt currently denies SI/HI and A/V hallucinations. Pt verbally agrees to seek staff if SI/HI or A/VH occurs and to consult with staff before acting on any harmful thoughts. Will continue POC.

## 2018-04-21 NOTE — Progress Notes (Signed)
Patient refused neurotin and clonidine this morning.  Patient stated [name] is leaving today and does not need any medications.  Patient denied SI and HI, contracts for safety.  Denied A/V hallucinations.  Denied pain.

## 2018-04-21 NOTE — Progress Notes (Signed)
Discharge Note:  Patient discharged to family member.  Patient denied SI and HI.  Denied A/V hallucinations.  Suicide prevention information given and discussed with patient who stated she understood and had no questions.  Patient stated she received all her belongings, clothing, toiletries, misc items, prescriptions, medications, etc.  Patient stated [facility].  All required discharge information given to patient at discharge.

## 2018-04-21 NOTE — [facility] Suicide Risk Assessment (Signed)
[facility] Discharge Suicide Risk Assessment   Principal Problem: Substance induced mood disorder [facility]) Discharge Diagnoses:  Patient Active Problem List   Diagnosis Date Noted  . Substance induced mood disorder (HCC) [F19.94] 04/19/2018  . Polysubstance (including opioids) dependence, daily use (HCC) [F19.20] 02/03/2018  . Polysubstance dependence including opioid type drug without complication, episodic abuse (HCC) [F19.20] 12/16/2017  . Major depressive disorder, recurrent severe without psychotic features (HCC) [F33.2] 12/16/2017  . Normal labor [O80, Z37.9] 10/16/2016  . Cigarette smoker [F17.210] 08/18/2016  . 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 Hepatitis C, chronic, maternal, antepartum (HCC) [O98.419, B18.2] 08/04/2016  . Supervision of high-risk pregnancy [O09.90] 07/21/2016  . History of substance abuse [Z87.898] 07/21/2016  . ASCUS with positive high risk HPV [IMO0002] 07/21/2016    Total Time spent with patient: 30 minutes  Musculoskeletal: Strength & Muscle Tone: within normal limits Gait & Station: normal Patient leans: N/A  Psychiatric Specialty Exam: Review of Systems  Constitutional: Negative for chills and fever.  Respiratory: Negative for cough and shortness of breath.   Cardiovascular: Negative for chest pain.  Gastrointestinal: Negative for abdominal pain, heartburn, nausea and vomiting.  Psychiatric/Behavioral: Negative for depression, hallucinations and suicidal ideas. The patient is not nervous/anxious and does not have insomnia.     Blood pressure 98/75, pulse 87, temperature 98.5 F (36.9 C), temperature source Oral, resp. rate 16, height 5\' 1"  (1.549 m), weight 49 kg (108 lb), last menstrual period 04/16/2018, SpO2 100 %, not currently breastfeeding.Body mass index is 20.41 kg/m.  General Appearance: Casual and Fairly Groomed  Patent attorney::  Good  Speech:  Clear and Coherent and Normal Rate   Volume:  Normal  Mood:  Euthymic  Affect:  Appropriate and Congruent  Thought Process:  Coherent and Goal Directed  Orientation:  Full (Time, Place, and Person)  Thought Content:  Logical  Suicidal Thoughts:  No  Homicidal Thoughts:  No  Memory:  Immediate;   Fair Recent;   Fair Remote;   Fair  Judgement:  Fair  Insight:  Fair  Psychomotor Activity:  Normal  Concentration:  Fair  Recall:  [employer] of Knowledge:Fair  Language: Good  Akathisia:  No  Handed:    AIMS (if indicated):     Assets:  Desire for Improvement Financial Resources/Insurance Housing Resilience Social Support  Sleep:  Number of Hours: 6.75  Cognition: WNL  ADL's:  Intact   Mental Status Per Nursing Assessment::   On Admission:  Self-harm thoughts  Demographic Factors:  Female, Caucasian, Low socioeconomic status, Living alone and Unemployed  Loss Factors: NA  Historical Factors: Impulsivity  Risk Reduction Factors:   Living with another person, especially a relative, Positive social support, Positive therapeutic relationship and Positive coping skills or problem solving skills  Continued Clinical Symptoms:  Severe Anxiety and/or Agitation Depression:   Comorbid alcohol abuse/dependence Impulsivity Alcohol/Substance Abuse/Dependencies More than one psychiatric diagnosis Unstable or Poor Therapeutic Relationship Previous Psychiatric Diagnoses and Treatments  Cognitive Features That Contribute To Risk:  None    Suicide Risk:  Minimal: No identifiable suicidal ideation.  Patients presenting with no risk factors but with morbid ruminations; may be classified as minimal risk based on the severity of the depressive symptoms  Follow-up Information    [facility]. Go on 04/25/2018.   Specialty:  Behavioral Health Why:  Appointment is Monday, 04/25/18 at 8:15am. Please be sure to bring you Photo ID, SSN, any insurance information and your discharge paperwork from your hospitalization.  Contact  information: [address] [place] [address] [phone]         Subjective Data:  [name] is a 27 y/o F with history of MDD and polysubstance abuse who was [facility] with complaints of worsening depression, SI with plan to overdose or jump from a parking structure, relapse of substance use of heroin/cocaine/cannabis, and medication non-adherence. She [facility] with [facility]. Pt was restarted on previous discharge medications of risperdal, remeron, and gabapentin. She has been reporting incremental improvement of her presenting symptoms.  Today upon evaluation, pt shares, "I'm good." She has no specific concerns. She is sleeping well. Her appetite is good. She denies physical complaints. She denies SI/HI/AH/VH. She is tolerating her medications without difficulty or side effects. She is in agreement to continue her current regimen after discharge. [facility]. She declines referral to substance use treatment, and states that she will be able to maintain her own sobriety by staying with her mother-in-law away from her social contacts which supplied her with illicit substances. She was able to engage in safety planning including [facility] or contact emergency services if she feels unable to maintain her own safety or the safety of others. Pt had no further questions, comments, or concerns.   Plan Of Care/Follow-up recommendations:   -Discharge to outpatient level of care  -MDD, recurrent, severe -Continue remeron 15mg  po qhs -Continue risperdal 1mg  po qhs  -Anxiety -Continue gabapentin 300mg  po TID - Continue vistaril 25mg  po q6h prn anxiety  -Insomnia -Continue trazodone 50mg  po qhs prn  insomnia  -Polysubstance dependence, including opioid use -Discontinue COWS with clonidine  Activity:  as tolerated Diet:  normal Tests:  NA Other:  see above for DC plan  Micheal Likenshristopher T Teesha Ohm, MD 04/21/2018, 8:29 AM

## 2018-04-21 NOTE — Discharge Summary (Addendum)
Physician Discharge Summary Note  Patient:  [name] is an 27 y.o., female  MRN:  [id number]  DOB:  1991/03/23  Patient phone:  [phone] (home)   Patient address:   [address] [place] [address],   Total Time spent with patient: Greater than 30 minutes  Date of Admission:  04/18/2018  Date of Discharge: 04/20/2018  Reason for Admission: Worsening depression, SI with plan to overdose or jump from a parking structure, relapse of substance use of heroin/cocaine/cannabis, and medication non-adherence.   Principal Problem: Substance induced mood disorder [employer])  Discharge Diagnoses: Patient Active Problem List   Diagnosis Date Noted  . Substance induced mood disorder (HCC) [F19.94] 04/19/2018    Priority: High  . Polysubstance dependence including opioid type drug without complication, episodic abuse (HCC) [F19.20] 12/16/2017    Priority: High  . Polysubstance (including opioids) dependence, daily use (HCC) [F19.20] 02/03/2018  . Major depressive disorder, recurrent severe without psychotic features (HCC) [F33.2] 12/16/2017  . Normal labor [O80, Z37.9] 10/16/2016  . Cigarette smoker [F17.210] 08/18/2016  . 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 Hepatitis C, chronic, maternal, antepartum (HCC) [O98.419, B18.2] 08/04/2016  . Supervision of high-risk pregnancy [O09.90] 07/21/2016  . History of substance abuse [Z87.898] 07/21/2016  . ASCUS with positive high risk HPV [IMO0002] 07/21/2016   Past Psychiatric History: Polysubstance use disorder, Major depression.  Past Medical History:  Past Medical History:  Diagnosis Date  . History of RPR test 07/21/2016   02/2016: 1:1 with negative TPA testing [ ]  f/u 28wks [ ]  f/u admit RPR [ ]  f/u PP RPR  . History of self-harm   . Polysubstance abuse [facility])     Past Surgical History:  Procedure Laterality Date  . TONSILLECTOMY     Family History:  Family History   Problem Relation Age of Onset  . Vision loss Mother   . Depression Mother   . Ulcers Mother   . Gallbladder disease Mother   . Mental illness Mother   . Thyroid disease Mother   . Allergies Father   . Vision loss Father   . Vision loss Maternal Grandmother   . Ulcers Maternal Grandmother   . Gallbladder disease Maternal Grandmother   . Diabetes Maternal Grandmother   . Vision loss Maternal Grandfather   . Heart disease Maternal Grandfather   . Cancer Maternal Grandfather   . Allergies Paternal Grandmother   . Vision loss Paternal Grandmother   . Gallbladder disease Paternal Grandmother   . Arthritis Paternal Grandmother   . Cancer Paternal Grandmother   . Allergies Paternal Grandfather   . Vision loss Paternal Grandfather    Family Psychiatric  History: See H&P  Social History:  Social History   Substance and Sexual Activity  Alcohol Use No     Social History   Substance and Sexual Activity  Drug Use Yes  . Frequency: 5.0 times per week  . Types: Cocaine, Marijuana, Methamphetamines   Comment: Heroin    Social History   Socioeconomic History  . Marital status: Single    Spouse name: Not on file  . Number of children: Not on file  . Years of education: Not on file  . Highest education level: Not on file  Occupational History  . Not on file  Social Needs  . Financial resource strain: Not on file  . Food insecurity:    Worry: Not on file    Inability: Not on file  .  Transportation needs:    Medical: Not on file    Non-medical: Not on file  Tobacco Use  . Smoking status: Current Every Day Smoker    Packs/day: 0.00    Types: Cigarettes  . Smokeless tobacco: Never Used  Substance and Sexual Activity  . Alcohol use: No  . Drug use: Yes    Frequency: 5.0 times per week    Types: Cocaine, Marijuana, Methamphetamines    Comment: Heroin  . Sexual activity: Yes    Partners: Male  Lifestyle  . Physical activity:    Days per week: Not on file    Minutes  per session: Not on file  . Stress: Not on file  Relationships  . Social connections:    Talks on phone: Not on file    Gets together: Not on file    Attends religious service: Not on file    Active member of club or organization: Not on file    Attends meetings of clubs or organizations: Not on file    Relationship status: Not on file  Other Topics Concern  . Not on file  Social History Narrative  . Not on file   Hospital Course: (Per Md's discharge SRA): [name] is a 27 y/o F with history of MDD and polysubstance abuse who was admitted voluntarily after walking in to [facility] with complaints of worsening depression, SI with plan to overdose or jump from a parking structure, relapse of substance use of heroin/cocaine/cannabis, and medication non-adherence. She has recent history of discharge from [facility] on 02/06/18 for similar presentation with plan to attend [facility] for substance use treatment.Pt was restarted on previous discharge medications of risperdal, remeron, and gabapentin. She has been reporting incremental improvement of her presenting symptoms.  Besides the re-initiation of Risperdal 1 mg for mood control, Mirtazapine 15 mg for depression/insomnia & gabapentin 300 mg for agitation, [name] was also medicated & discharged on Nicorette gum 2 mg for smoking cessation & Trazodone 50 mg for insomnia. She was enrolled & participated in the group counseling sessions being offered & held on this unit. She learned coping skills. She presented no other significant health issues that required treatment & or monitoring. She tolerated her treatment regimen without any adverse effects or reactions reported.  Today upon her discharge evaluation with attending psychiatrist, pt shares, "I'm good." She has no specific concerns. She is sleeping well. Her appetite is good. She denies physical complaints. She denies SI/HI/AH/VH. She is tolerating her medications without difficulty or side effects. She is in  agreement to continue her current regimen after discharge. She plans to follow up at [facility] for outpatient mental health treatment. She declines referral to substance use treatment, and states that she will be able to maintain her own sobriety by staying with her mother-in-law away from her social contacts which supplied her with illicit substances. She was able to engage in safety planning including plan to return to [facility] or contact emergency services if she feels unable to maintain her own safety or the safety of others. Pt had no further questions, comments, or concerns.  Upon discharge, [name] presents mentally & medically stable. She will continue mental health care on an outpatient basis as noted below. She is provided with all the necessary information needed to make this appointment without problems. She received from the [facility] pharmacy, a 7 days worth supply samples of his [facility] discharge medications. She left with all personal belongings in no apparent distress. Transportation per family.  Physical Findings: AIMS: Facial and Oral Movements Muscles of Facial Expression: None, normal Lips and Perioral Area: None, normal Jaw: None, normal Tongue: None, normal,Extremity Movements Upper (arms, wrists, hands, fingers): None, normal Lower (legs, knees, ankles, toes): None, normal, Trunk Movements Neck, shoulders, hips: None, normal, Overall Severity Severity of abnormal movements (highest score from questions above): None, normal Incapacitation due to abnormal movements: None, normal Patient's awareness of abnormal movements (rate only patient's report): No Awareness, Dental Status Current problems with teeth and/or dentures?: No Does patient usually wear dentures?: No  CIWA:  CIWA-Ar Total: 2 COWS:  COWS Total Score: 2  Musculoskeletal: Strength & Muscle Tone: within normal limits Gait & Station: normal Patient leans: N/A  Psychiatric Specialty Exam:See SRA by MD  Physical Exam   Vitals reviewed. Constitutional: She is oriented to person, place, and time. She appears well-developed.  HENT:  Head: Normocephalic.  Eyes: Pupils are equal, round, and reactive to light.  Neck: Normal range of motion.  Cardiovascular: Normal rate.  Respiratory: Effort normal.  GI: Soft.  Genitourinary:  Genitourinary Comments: Deferred  Musculoskeletal: Normal range of motion.  Neurological: She is alert and oriented to person, place, and time.  Skin: Skin is warm.  Psychiatric: She has a normal mood and affect.    Review of Systems  Constitutional: Negative.   HENT: Negative.   Eyes: Negative.   Respiratory: Negative.   Cardiovascular: Negative.   Gastrointestinal: Negative.   Genitourinary: Negative.   Musculoskeletal: Negative.   Skin: Negative.   Neurological: Negative.   Endo/Heme/Allergies: Negative.   Psychiatric/Behavioral: Positive for depression (Stable ) and substance abuse ( Hx. Opioid, Cocaine, THC, Amphetamine). Negative for hallucinations, memory loss and suicidal ideas. The patient has insomnia (Stable). The patient is not nervous/anxious.   All other systems reviewed and are negative.   Blood pressure 98/75, pulse 87, temperature 98.5 F (36.9 C), temperature source Oral, resp. rate 16, height 5\' 1"  (1.549 m), weight 49 kg (108 lb), last menstrual period 04/16/2018, SpO2 100 %, not currently breastfeeding.Body mass index is 20.41 kg/m.    Have you used any form of tobacco in the last 30 days? (Cigarettes, Smokeless Tobacco, Cigars, and/or Pipes): Yes  Has this patient used any form of tobacco in the last 30 days? (Cigarettes, Smokeless Tobacco, Cigars, and/or Pipes): Yes, an FDA-approved tobacco cessation medication was offered at discharge.  Blood Alcohol level:  Lab Results  Component Value Date   ETH <10 02/02/2018   ETH <10 12/16/2017   Metabolic Disorder Labs:  No results found for: HGBA1C, MPG No results found for: PROLACTIN No results found  for: CHOL, TRIG, HDL, CHOLHDL, VLDL, LDLCALC  See Psychiatric Specialty Exam and Suicide Risk Assessment completed by Attending Physician prior to discharge.  Discharge destination:  Home  Is patient on multiple antipsychotic therapies at discharge:  No   Has Patient had three or more failed trials of antipsychotic monotherapy by history:  No  Recommended Plan for Multiple Antipsychotic Therapies: NA  Allergies as of 04/21/2018      Reactions   Dextromethorphan Swelling      Medication List    TAKE these medications     Indication  gabapentin 300 MG capsule Commonly known as:  NEURONTIN Take 1 capsule (300 mg total) by mouth 3 (three) times daily. For agitation What changed:  additional instructions  Indication:  Agitation   hydrOXYzine 25 MG tablet Commonly known as:  ATARAX/VISTARIL Take 1 tablet (25 mg total) by mouth every 6 (six) hours  as needed for anxiety.  Indication:  Feeling Anxious   mirtazapine 15 MG tablet Commonly known as:  REMERON Take 1 tablet (15 mg total) by mouth at bedtime. For depression/sleep What changed:  additional instructions  Indication:  Major Depressive Disorder, Insomnia   nicotine polacrilex 2 MG gum Commonly known as:  NICORETTE Take 1 each (2 mg total) by mouth as needed for smoking cessation. (May purchase from over the counter): For smoking cessation  Indication:  Nicotine Addiction   risperiDONE 1 MG tablet Commonly known as:  RISPERDAL Take 1 tablet (1 mg total) by mouth at bedtime. For mood control What changed:  additional instructions  Indication:  Mood control   traZODone 50 MG tablet Commonly known as:  DESYREL Take 1 tablet (50 mg total) by mouth at bedtime as needed for sleep.  Indication:  Trouble Sleeping      Follow-up [facility]. Go on 04/25/2018.   Specialty:  Behavioral Health Why:  Appointment is Monday, 04/25/18 at 8:15am. Please be sure to bring you Photo ID, SSN, any insurance information and  your discharge paperwork from your hospitalization.  Contact information: [address] [place] [address] [phone]          Follow-up recommendations: As tolerated Diet: As recommended by your primary care doctor. Keep all scheduled follow-up appointments as recommended.   Comments: Patient is instructed prior to discharge to: Take all medications as prescribed by his/her mental healthcare provider. Report any adverse effects and or reactions from the medicines to his/her outpatient provider promptly. Patient has been instructed & cautioned: To not engage in alcohol and or illegal drug use while on prescription medicines. In the event of worsening symptoms, patient is instructed to call the crisis hotline, 911 and or go to the nearest ED for appropriate evaluation and treatment of symptoms. To follow-up with his/her primary care provider for your other medical issues, concerns and or health care needs.   Signed: Armandina Stammer, NP, PMHNP, FNP-BC 04/21/2018, 9:55 AM    Patient seen, Suicide Assessment Completed.  Disposition Plan Reviewed

## 2018-04-21 NOTE — Progress Notes (Signed)
  [facility] Dis[facility] :  Will you be returning to the same living situation after discharge:  Yes,  patient reports she is returning home with her father. She states that she plans to live in between her father and mother-in-law homes.  At discharge, do you have transportation home?: Yes,  patient reports her husband and father are picking her up at discharge Do you have the ability to pay for your medications: Yes,  Medicaid  Release of information consent forms completed and in the chart;  Patient's signature needed at discharge.  Patient to Follow up at: Follow-up Information    [facility]. Go on 04/25/2018.   Specialty:  Behavioral Health Why:  Appointment is Monday, 04/25/18 at 8:15am. Please be sure to bring you Photo ID, SSN, any insurance information and your discharge paperwork from your hospitalization.  Contact information: [address] [place] [id number] [phone]           Next level of care provider has access to [facility] Link:yes  Safety Planning and Suicide Prevention discussed: Yes,  with the patient  Have you used any form of tobacco in the last 30 days? (Cigarettes, Smokeless Tobacco, Cigars, and/or Pipes): Yes  Has patient been referred to the Quitline?: Patient refused referral  Patient has been referred for addiction treatment: Pt. refused referral  Maeola SarahJolan E Trustin Chapa, LCSWA 04/21/2018, 8:57 AM

## 2018-04-21 NOTE — Progress Notes (Signed)
D:  Patient has not filled out her self inventory form.  Patient refused all her morning medications, stated she is ready for discharge. A:  Emotional support and encouragement given patient. R:  Patient was asked several times to get out of bed and attend AA group in dayroom.  Patient finally got out of bed and went to group. Patient denied SI and HI.  Denied A/V hallucinations.  Denied pain. Safety maintained with 15 minute checks.

## 2018-07-12 ENCOUNTER — Ambulatory Visit (HOSPITAL_COMMUNITY)
Admission: RE | Admit: 2018-07-12 | Discharge: 2018-07-12 | Disposition: A | Discharge status: Home / Self Care | Payer: Medicaid Other | Attending: Psychiatry | Admitting: Psychiatry

## 2018-07-12 ENCOUNTER — Emergency Department (HOSPITAL_COMMUNITY)
Admission: EM | Admit: 2018-07-12 | Discharge: 2018-07-13 | Disposition: A | Discharge status: Home / Self Care | Payer: Medicaid Other | Attending: Emergency Medicine | Admitting: Emergency Medicine

## 2018-07-12 ENCOUNTER — Other Ambulatory Visit: Payer: Self-pay

## 2018-07-12 ENCOUNTER — Encounter (HOSPITAL_COMMUNITY): Payer: Self-pay | Admitting: Emergency Medicine

## 2018-07-12 DIAGNOSIS — R45851 Suicidal ideations: Secondary | ICD-10-CM | POA: Insufficient documentation

## 2018-07-12 DIAGNOSIS — B182 Chronic viral hepatitis C: Secondary | ICD-10-CM | POA: Insufficient documentation

## 2018-07-12 DIAGNOSIS — F17210 Nicotine dependence, cigarettes, uncomplicated: Secondary | ICD-10-CM | POA: Diagnosis not present

## 2018-07-12 DIAGNOSIS — F1420 Cocaine dependence, uncomplicated: Secondary | ICD-10-CM | POA: Diagnosis not present

## 2018-07-12 DIAGNOSIS — F419 Anxiety disorder, unspecified: Secondary | ICD-10-CM | POA: Diagnosis not present

## 2018-07-12 DIAGNOSIS — Z008 Encounter for other general examination: Secondary | ICD-10-CM

## 2018-07-12 DIAGNOSIS — F1120 Opioid dependence, uncomplicated: Secondary | ICD-10-CM | POA: Insufficient documentation

## 2018-07-12 DIAGNOSIS — Z79899 Other long term (current) drug therapy: Secondary | ICD-10-CM | POA: Diagnosis not present

## 2018-07-12 DIAGNOSIS — F1920 Other psychoactive substance dependence, uncomplicated: Secondary | ICD-10-CM

## 2018-07-12 DIAGNOSIS — F329 Major depressive disorder, single episode, unspecified: Secondary | ICD-10-CM | POA: Diagnosis not present

## 2018-07-12 DIAGNOSIS — F1910 Other psychoactive substance abuse, uncomplicated: Secondary | ICD-10-CM

## 2018-07-12 DIAGNOSIS — F1290 Cannabis use, unspecified, uncomplicated: Secondary | ICD-10-CM | POA: Diagnosis not present

## 2018-07-12 DIAGNOSIS — Z720 Tobacco use: Secondary | ICD-10-CM

## 2018-07-12 LAB — CBC
HCT: 37.4 % (ref 36.0–46.0)
HEMOGLOBIN: 12.6 g/dL (ref 12.0–15.0)
MCH: 31.1 pg (ref 26.0–34.0)
MCHC: 33.7 g/dL (ref 30.0–36.0)
MCV: 92.3 fL (ref 78.0–100.0)
Platelets: 343 10*3/uL (ref 150–400)
RBC: 4.05 MIL/uL (ref 3.87–5.11)
RDW: 13.3 % (ref 11.5–15.5)
WBC: 7.2 10*3/uL (ref 4.0–10.5)

## 2018-07-12 LAB — COMPREHENSIVE METABOLIC PANEL
ALBUMIN: 4.5 g/dL (ref 3.5–5.0)
ALK PHOS: 55 U/L (ref 38–126)
ALT: 53 U/L — AB (ref 0–44)
AST: 45 U/L — AB (ref 15–41)
Anion gap: 10 (ref 5–15)
BILIRUBIN TOTAL: 1.2 mg/dL (ref 0.3–1.2)
BUN: 15 mg/dL (ref 6–20)
CALCIUM: 9.4 mg/dL (ref 8.9–10.3)
CO2: 27 mmol/L (ref 22–32)
Chloride: 102 mmol/L (ref 98–111)
Creatinine, Ser: 1.06 mg/dL — ABNORMAL HIGH (ref 0.44–1.00)
GFR calc Af Amer: >60 mL/min (ref >60)
GFR calc non Af Amer: >60 mL/min (ref >60)
GLUCOSE: 95 mg/dL (ref 70–99)
Potassium: 3.5 mmol/L (ref 3.5–5.1)
Sodium: 139 mmol/L (ref 135–145)
TOTAL PROTEIN: 7.7 g/dL (ref 6.5–8.1)

## 2018-07-12 LAB — SALICYLATE LEVEL: Salicylate Lvl: <7 mg/dL (ref 2.8–30.0)

## 2018-07-12 LAB — RAPID URINE DRUG SCREEN, HOSP PERFORMED
Amphetamines: NOT DETECTED
Benzodiazepines: NOT DETECTED
Cocaine: POSITIVE — AB
OPIATES: POSITIVE — AB
TETRAHYDROCANNABINOL: POSITIVE — AB

## 2018-07-12 LAB — ETHANOL: Alcohol, Ethyl (B): <10 mg/dL (ref <10)

## 2018-07-12 LAB — ACETAMINOPHEN LEVEL

## 2018-07-12 LAB — PREGNANCY, URINE: Preg Test, Ur: NEGATIVE

## 2018-07-12 MED ORDER — RISPERIDONE 1 MG PO TABS
1.0000 mg | ORAL_TABLET | Freq: Every day | ORAL | Status: DC
  Administered 2018-07-12: 1 mg via ORAL
  Filled 2018-07-12: qty 1

## 2018-07-12 MED ORDER — NICOTINE 21 MG/24HR TD PT24
21.0000 mg | MEDICATED_PATCH | Freq: Every day | TRANSDERMAL | Status: DC
  Administered 2018-07-13: 21 mg via TRANSDERMAL
  Filled 2018-07-12: qty 1

## 2018-07-12 MED ORDER — METHOCARBAMOL 500 MG PO TABS: 500.0000 mg | ORAL_TABLET | Freq: Three times a day (TID) | ORAL | Status: DC | PRN

## 2018-07-12 MED ORDER — CLONIDINE HCL 0.1 MG PO TABS: 0.1000 mg | ORAL_TABLET | Freq: Every day | ORAL | Status: DC

## 2018-07-12 MED ORDER — TRAZODONE HCL 50 MG PO TABS
50.0000 mg | ORAL_TABLET | Freq: Every evening | ORAL | Status: DC | PRN
  Administered 2018-07-12: 50 mg via ORAL
  Filled 2018-07-12: qty 1

## 2018-07-12 MED ORDER — ALUM & MAG HYDROXIDE-SIMETH 200-200-20 MG/5ML PO SUSP: 30.0000 mL | Freq: Four times a day (QID) | ORAL | Status: DC | PRN

## 2018-07-12 MED ORDER — IBUPROFEN 200 MG PO TABS: 600.0000 mg | ORAL_TABLET | Freq: Three times a day (TID) | ORAL | Status: DC | PRN

## 2018-07-12 MED ORDER — HYDROXYZINE HCL 25 MG PO TABS
25.0000 mg | ORAL_TABLET | Freq: Four times a day (QID) | ORAL | Status: DC | PRN
  Administered 2018-07-12: 25 mg via ORAL
  Filled 2018-07-12: qty 1

## 2018-07-12 MED ORDER — ONDANSETRON 4 MG PO TBDP: 4.0000 mg | ORAL_TABLET | Freq: Four times a day (QID) | ORAL | Status: DC | PRN

## 2018-07-12 MED ORDER — MIRTAZAPINE 30 MG PO TABS
15.0000 mg | ORAL_TABLET | Freq: Every day | ORAL | Status: DC
  Administered 2018-07-12: 15 mg via ORAL
  Filled 2018-07-12: qty 1

## 2018-07-12 MED ORDER — CLONIDINE HCL 0.1 MG PO TABS
0.1000 mg | ORAL_TABLET | Freq: Four times a day (QID) | ORAL | Status: DC
  Administered 2018-07-12: 0.1 mg via ORAL
  Filled 2018-07-12 (×3): qty 1

## 2018-07-12 MED ORDER — DICYCLOMINE HCL 20 MG PO TABS
20.0000 mg | ORAL_TABLET | Freq: Four times a day (QID) | ORAL | Status: DC | PRN
  Administered 2018-07-12: 20 mg via ORAL
  Filled 2018-07-12: qty 1

## 2018-07-12 MED ORDER — GABAPENTIN 300 MG PO CAPS
300.0000 mg | ORAL_CAPSULE | Freq: Three times a day (TID) | ORAL | Status: DC
  Administered 2018-07-12 – 2018-07-13 (×3): 300 mg via ORAL
  Filled 2018-07-12 (×3): qty 1

## 2018-07-12 MED ORDER — LOPERAMIDE HCL 2 MG PO CAPS: 2.0000 mg | ORAL_CAPSULE | ORAL | Status: DC | PRN

## 2018-07-12 MED ORDER — CLONIDINE HCL 0.1 MG PO TABS: 0.1000 mg | ORAL_TABLET | Freq: Two times a day (BID) | ORAL | Status: DC

## 2018-07-12 NOTE — ED Triage Notes (Signed)
[facility] with [facility] of [facility] ideation. Reports that she uses heroin and cocaine daily. Hx of same.
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
## 2018-07-12 NOTE — ED Provider Notes (Signed)
[facility]-EMERGENCY DEPT Provider Note   CSN: [id number] Arrival date & time: 07/12/18  1328     History   Chief Complaint Chief Complaint  Patient presents with  . Medical Clearance  . Suicidal    HPI [name] is a 27 y.o. female with a PMHx of polysubstance [name], [name], [name], [name] listed below, who presents to the ED from [facility] for medical clearance.  Per notes from TTS counselor Danny Sprinkle: "[name] is an 27 y.o. female who presented as a walk-in at [facility].  Patient states that she is depressed Holly suicidal with a plan to jump off a bridge, but with questionable intent.  Patient states that she is depressed Holly states: "I don't want to live this way any more, I am miserable." Patient states that she Holly her husband were both addicts Holly that he is in jail for larceny charges Holly she states that she misses him very much.  She states that she  relapsed after one to two months clean last week on heroin Holly cocaine Holly states that she has been using daily since her relapse with her last use being last pm.  Patient states that she has been injecting 1/2 gram of heroin daily Holly states that she has been smoking a gram of cocaine daily.  Patient states that she has a court date for shoplifting from [facility] Friday of this week, a charge stemming from her addiction. Patient states that she is also homeless Holly has nowhere to go.  Patient states that she has lost everything due to her addiction.  Patient states that she lost her job, her car, her apartment Holly she had to sign the custody of her children to her sister."  Ms. Shuvon Rankin NP evaluated the patient Holly recommended IP treatment once medically cleared; she was sent here for medical clearance.   Pt is able to confirm the above information, however she states that she snorts heroin, doesn't inject it.  She states she is "miserable" Holly is having SI with a plan to either jump off a  parking deck or OD.  She denies HI/AVH, denies EtOH use. Reports tobacco use, but wants to try to quit.  LMP 06/12/18, states she's due to start today.  She has not had her meds in about 4-5 days (takes remeron, risperdal, gabapentin, Holly PRN trazodone Holly vistaril).  She has no medical complaints at this time Holly is here voluntarily.    The history is provided by the patient Holly medical records. No language interpreter was used.    Past Medical History:  Diagnosis Date  . History of RPR test 07/21/2016   02/2016: 1:1 with negative TPA testing [ ]  f/u 28wks [ ]  f/u admit RPR [ ]  f/u PP RPR  . History of self-harm   . Polysubstance [name] [facility])     Patient Active Problem List   Diagnosis Date Noted  . Substance induced mood disorder (HCC) 04/19/2018  . Polysubstance (including opioids) dependence, daily use (HCC) 02/03/2018  . Polysubstance dependence including opioid type drug without complication, episodic [name] (HCC) 12/16/2017  . 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 Normal labor 10/16/2016  . Cigarette smoker 08/18/2016  . Pregnancy complicated by subutex maintenance, antepartum (HCC) 08/04/2016  . Hepatitis C, chronic, maternal, antepartum (HCC) 08/04/2016  . Supervision of high-risk pregnancy 07/21/2016  . History of substance [name] 07/21/2016  . ASCUS with positive high risk HPV  07/21/2016    Past Surgical History:  Procedure Laterality Date  . TONSILLECTOMY       OB History    Gravida  2   Para  2   Term  2   Preterm      AB      Living  2     SAB      TAB      Ectopic      Multiple  0   Live Births  2            Home Medications    Prior to Admission medications   Medication Sig Start Date End Date Taking? Authorizing Provider  gabapentin (NEURONTIN) 300 MG capsule Take 1 capsule (300 mg total) by mouth 3 (three) times daily. For agitation 04/21/18   Armandina Stammer I, NP  hydrOXYzine  (ATARAX/VISTARIL) 25 MG tablet Take 1 tablet (25 mg total) by mouth every 6 (six) hours as needed for anxiety. 04/21/18   Armandina Stammer I, NP  mirtazapine (REMERON) 15 MG tablet Take 1 tablet (15 mg total) by mouth at bedtime. For [name]/sleep 04/21/18   Armandina Stammer I, NP  nicotine polacrilex (NICORETTE) 2 MG gum Take 1 each (2 mg total) by mouth as needed for smoking cessation. (May purchase from over the counter): For smoking cessation 04/21/18   Armandina Stammer I, NP  risperiDONE (RISPERDAL) 1 MG tablet Take 1 tablet (1 mg total) by mouth at bedtime. For mood control 04/21/18   Armandina Stammer I, NP  traZODone (DESYREL) 50 MG tablet Take 1 tablet (50 mg total) by mouth at bedtime as needed for sleep. 04/21/18   Sanjuana Kava, NP    Family History Family History  Problem Relation Age of Onset  . Vision loss Mother   . [name] Mother   . Ulcers Mother   . Gallbladder disease Mother   . Mental illness Mother   . Thyroid disease Mother   . Allergies Father   . Vision loss Father   . Vision loss Maternal Grandmother   . Ulcers Maternal Grandmother   . Gallbladder disease Maternal Grandmother   . Diabetes Maternal Grandmother   . Vision loss Maternal Grandfather   . Heart disease Maternal Grandfather   . Cancer Maternal Grandfather   . Allergies Paternal Grandmother   . Vision loss Paternal Grandmother   . Gallbladder disease Paternal Grandmother   . Arthritis Paternal Grandmother   . Cancer Paternal Grandmother   . Allergies Paternal Grandfather   . Vision loss Paternal Grandfather     Social History Social History   Tobacco Use  . Smoking status: Current Every Day Smoker    Packs/day: 0.00    Types: Cigarettes  . Smokeless tobacco: Never Used  Substance Use Topics  . Alcohol use: No  . Drug use: Yes    Frequency: 5.0 times per week    Types: Cocaine, Marijuana, Methamphetamines    Comment: Heroin     Allergies   Dextromethorphan   Review of Systems Review of  Systems  Constitutional: Negative for chills Holly fever.  Respiratory: Negative for shortness of breath.   Cardiovascular: Negative for chest pain.  Gastrointestinal: Negative for abdominal pain, constipation, diarrhea, nausea Holly vomiting.  Genitourinary: Negative for dysuria Holly hematuria.  Musculoskeletal: Negative for arthralgias Holly myalgias.  Skin: Negative for color change.  Allergic/Immunologic: Positive for immunocompromised state (chronic Hep C).  Neurological: Negative for weakness Holly numbness.  Psychiatric/Behavioral: Positive for suicidal ideas. Negative for confusion  Holly hallucinations.   All other systems reviewed Holly are negative for acute change except as noted in the HPI.    Physical Exam Updated Vital Signs BP 114/74 (BP Location: Right Arm)   Pulse 71   Temp 98.6 F (37 C) (Oral)   Resp 19   SpO2 100%   Physical Exam  Constitutional: She is oriented to person, place, Holly time. Vital signs are normal. She appears well-developed Holly well-nourished.  Non-toxic appearance. No distress.  Afebrile, nontoxic, NAD  HENT:  Head: Normocephalic Holly atraumatic.  Mouth/Throat: Oropharynx is clear Holly moist Holly mucous membranes are normal.  Eyes: Conjunctivae Holly EOM are normal. Right eye exhibits no discharge. Left eye exhibits no discharge.  Neck: Normal range of motion. Neck supple.  Cardiovascular: Normal rate, regular rhythm, normal heart sounds Holly intact distal pulses. Exam reveals no gallop Holly no friction rub.  No murmur heard. Pulmonary/Chest: Effort normal Holly breath sounds normal. No respiratory distress. She has no decreased breath sounds. She has no wheezes. She has no rhonchi. She has no rales.  Abdominal: Soft. Normal appearance Holly bowel sounds are normal. She exhibits no distension. There is no tenderness. There is no rigidity, no rebound, no guarding, no CVA tenderness, no tenderness at McBurney's point Holly negative Murphy's sign.  Musculoskeletal: Normal  range of motion.  Neurological: She is alert Holly oriented to person, place, Holly time. She has normal strength. No sensory deficit.  Skin: Skin is warm, dry Holly intact. No rash noted.  Psychiatric: She is not actively hallucinating. She exhibits a depressed mood. She expresses suicidal ideation. She expresses no homicidal ideation. She expresses suicidal plans. She expresses no homicidal plans.  Depressed affect, but pleasant Holly cooperative. Endorsing SI with a plan, denies HI or AVH, doesn't seem to be responding to internal stimuli.   Nursing note Holly vitals reviewed.    ED Treatments / Results  Labs (all labs ordered are listed, but only abnormal results are displayed) Labs Reviewed  COMPREHENSIVE METABOLIC PANEL - Abnormal; Notable for the following components:      Result Value   Creatinine, Ser 1.06 (*)    AST 45 (*)    ALT 53 (*)    All other components within normal limits  ACETAMINOPHEN LEVEL - Abnormal; Notable for the following components:   Acetaminophen (Tylenol), Serum <10 (*)    All other components within normal limits  RAPID URINE DRUG SCREEN, HOSP PERFORMED - Abnormal; Notable for the following components:   Opiates POSITIVE (*)    Cocaine POSITIVE (*)    Tetrahydrocannabinol POSITIVE (*)    Barbiturates   (*)    Value: Result not available. Reagent lot number recalled by manufacturer.   All other components within normal limits  ETHANOL  SALICYLATE LEVEL  CBC  PREGNANCY, URINE    EKG None  Radiology No results found.  Procedures Procedures (including critical care time)  Medications Ordered in ED Medications  gabapentin (NEURONTIN) capsule 300 mg (has no administration in time range)  hydrOXYzine (ATARAX/VISTARIL) tablet 25 mg (has no administration in time range)  mirtazapine (REMERON) tablet 15 mg (has no administration in time range)  risperiDONE (RISPERDAL) tablet 1 mg (has no administration in time range)  traZODone (DESYREL) tablet 50 mg (has  no administration in time range)  cloNIDine (CATAPRES) tablet 0.1 mg (has no administration in time range)    Followed by  cloNIDine (CATAPRES) tablet 0.1 mg (has no administration in time range)    Followed by  cloNIDine (CATAPRES) tablet 0.1 mg (has no administration in time range)  dicyclomine (BENTYL) tablet 20 mg (has no administration in time range)  loperamide (IMODIUM) capsule 2-4 mg (has no administration in time range)  methocarbamol (ROBAXIN) tablet 500 mg (has no administration in time range)  ondansetron (ZOFRAN-ODT) disintegrating tablet 4 mg (has no administration in time range)  ibuprofen (ADVIL,MOTRIN) tablet 600 mg (has no administration in time range)  alum & mag hydroxide-simeth (MAALOX/MYLANTA) 200-200-20 MG/5ML suspension 30 mL (has no administration in time range)  nicotine (NICODERM CQ - dosed in mg/24 hours) patch 21 mg (has no administration in time range)     Initial Impression / Assessment Holly Plan / ED Course  I have reviewed the triage vital signs Holly the nursing notes.  Pertinent labs & imaging results that were available during my care of the patient were reviewed by me Holly considered in my medical decision making (see chart for details).     27 y.o. female here with SI with a plan, sent from [facility] for med clearance. +Heroin Holly crack cocaine use, +tobacco use, cessation of both was encouraged. Denies HI/AVH/EtOH use. Noncompliant with meds x4-5 days. No medical complaints. Exam benign aside from having depressed affect. Will get psych clearance labs, TTS/[facility] has already seen her Holly evaluated her, recommending IP treatment. Will reassess after clearance labs.   4:09 PM CBC WNL. CMP with mildly elevated AST/ALT but somewhat similar to prior visits, also with marginally elevated Cr 1.06 but otherwise WNL. EtOH level undetectable. Salicylate Holly acetaminophen levels WNL. Upreg neg. UDS with +opiates, +cocaine, Holly +THC. Pt medically cleared at this time. Psych  hold Holly withdrawal orders placed, home med orders placed. Please see TTS notes for further documentation of care/dispo. PLEASE NOTE THAT PT IS HERE VOLUNTARILY AT THIS TIME, IF PT TRIES TO LEAVE THEY WOULD NEED IVC PAPERWORK TAKEN OUT. Pt stable at time of med clearance.     Final Clinical Impressions(s) / ED Diagnoses   Final diagnoses:  Suicidal ideation  Polysubstance [name] [facility])  Medical clearance for psychiatric admission  Tobacco user    ED Discharge Orders    [address] [place], [place] 07/12/18 1609    Lorre Nick, MD 07/13/18 1557

## 2018-07-12 NOTE — H&P (Signed)
Behavioral Health Medical Screening Exam  [name] [name] is an 27 y.o. female presents as walk in at [facility] with complaints of suicidal ideation and unable to contract for safety.  Patient also states that she is using heroin and cocaine daily.    Total Time spent with patient: 30 minutes  Psychiatric Specialty Exam: Physical Exam  Constitutional: She is oriented to person, place, and time.  HENT:  Head: Normocephalic.  Neck: Normal range of motion. Neck supple.  Respiratory: Effort normal.  Musculoskeletal: Normal range of motion.  Neurological: She is alert and oriented to person, place, and time.  Skin: Skin is warm and dry.  Facial acne     Review of Systems  Psychiatric/Behavioral: Positive for depression, substance abuse and suicidal ideas.  All other systems reviewed and are negative.   Blood pressure 101/77, pulse 74, temperature 97.8 F (36.6 C), resp. rate 20, SpO2 100 %, not currently breastfeeding.There is no height or weight on file to calculate BMI.  General Appearance: Casual  Eye Contact:  Fair  Speech:  Clear and Coherent and Normal Rate  Volume:  Normal  Mood:  Anxious, Depressed and Irritable  Affect:  Congruent and Depressed  Thought Process:  Coherent  Orientation:  Full (Time, Place, and Person)  Thought Content:  Logical  Suicidal Thoughts:  Yes.  with intent/plan  Homicidal Thoughts:  No  Memory:  Immediate;   Good Recent;   Good Remote;   Good  Judgement:  Impaired  Insight:  Lacking and Shallow  Psychomotor Activity:  Restlessness  Concentration: Concentration: Fair and Attention Span: Fair  Recall:  Good  Fund of Knowledge:Fair  Language: Good  Akathisia:  No  Handed:  Right  AIMS (if indicated):     Assets:  Communication Skills Desire for Improvement  Sleep:       Musculoskeletal: Strength & Muscle Tone: within normal limits Gait & Station: normal Patient leans: N/A  Blood pressure 101/77, pulse 74, temperature 97.8 F (36.6  C), resp. rate 20, SpO2 100 %, not currently breastfeeding.  Recommendations:  Inpatient psychiatric Traitement; after medical clearance  Based on my evaluation the patient does not appear to have an emergency medical condition.  Shuvon Rankin, NP 07/12/2018, 1:23 PM

## 2018-07-12 NOTE — ED Notes (Signed)
Belongings located in locker #34. 1 patient belongings bag with patient label. Belongings have been itemized on belongings sheet, located in pt chart at nurses station.

## 2018-07-12 NOTE — ED Notes (Addendum)
Pt sleeping at present, no distress noted, calm & cooperative.  Remains depressed and sad.  Monitoring for safety, Q 15 min checks in effect.

## 2018-07-12 NOTE — ED Notes (Signed)
Pt calm, cooperative and appropriate when admitted to the Acute Unit. She wanted to go straight to bed. She is in bed resting.

## 2018-07-13 ENCOUNTER — Inpatient Hospital Stay (HOSPITAL_COMMUNITY): Admission: AD | Admit: 2018-07-13 | Payer: Medicaid Other | Source: Intra-hospital | Admitting: Psychiatry

## 2018-07-13 DIAGNOSIS — F1920 Other psychoactive substance dependence, uncomplicated: Secondary | ICD-10-CM

## 2018-07-13 MED ORDER — GABAPENTIN 300 MG PO CAPS: 300.0000 mg | ORAL_CAPSULE | Freq: Three times a day (TID) | ORAL | 0 refills | Status: DC

## 2018-07-13 MED ORDER — HYDROXYZINE HCL 25 MG PO TABS: 25.0000 mg | ORAL_TABLET | Freq: Four times a day (QID) | ORAL | 0 refills | Status: DC | PRN

## 2018-07-13 MED ORDER — TRAZODONE HCL 50 MG PO TABS: 50.0000 mg | ORAL_TABLET | Freq: Every evening | ORAL | 0 refills | Status: DC | PRN

## 2018-07-13 MED ORDER — RISPERIDONE 1 MG PO TABS: 1.0000 mg | ORAL_TABLET | Freq: Every day | ORAL | 0 refills | Status: DC

## 2018-07-13 MED ORDER — MIRTAZAPINE 15 MG PO TABS: 15.0000 mg | ORAL_TABLET | Freq: Every day | ORAL | 0 refills | Status: DC

## 2018-07-13 NOTE — Consult Note (Addendum)
[facility] Face-to-Face Psychiatry Consult   Reason for Consult:  Substance abuse with suicidal ideations Referring Physician:  EDP Patient Identification: [name] MRN:  [id number] Principal Diagnosis: Polysubstance dependence including opioid type drug without complication, episodic abuse [facility]) Diagnosis:   Patient Active Problem List   Diagnosis Date Noted  . Polysubstance dependence including opioid type drug without complication, episodic abuse ([place]) [F19.20] 12/16/2017    Priority: High  . Substance induced mood disorder ([place]) [F19.94] 04/19/2018  . Polysubstance (including opioids) dependence, daily use ([place]) [F19.20] 02/03/2018  . Major depressive disorder, recurrent severe without psychotic features ([place]) [F33.2] 12/16/2017  . Normal labor [O80, Z37.9] 10/16/2016  . Cigarette smoker [F17.210] 08/18/2016  . Pregnancy complicated by subutex maintenance, antepartum ([place]) [O99.320, F11.20] 08/04/2016  . Hepatitis C, chronic, maternal, antepartum ([place]) [O98.419, B18.2] 08/04/2016  . Supervision of high-risk pregnancy [O09.90] 07/21/2016  . History of substance abuse [Z87.898] 07/21/2016  . ASCUS with positive high risk HPV [IMO0002] 07/21/2016    Total Time spent with patient: 45 minutes  Subjective:   [name] is a 27 y.o. female patient does not warrant admission.  HPI:  27 yo female who came to the ED yesterday after using cocaine and heroin and having suicidal ideations.  Her depression started about 2 months ago when she relapsed on drugs and worsened with use.  Therapy and medications alleviate symptoms.  She slept since admission and today she denies suicidal/homiciddal ideations, hallucinations, and withdrawal symptoms.  [name] was discharged from [facility] at the end of April, admission in Feb and Dec.  Met with Peer Support and resources provided, stable for discharge.  Past Psychiatric History: substance abuse, depression  Risk to Self:  none Risk to Others:   none Prior Inpatient Therapy:  multiple times Prior Outpatient Therapy:  yes  Past Medical History:  Past Medical History:  Diagnosis Date  . History of RPR test 07/21/2016   02/2016: 1:1 with negative TPA testing _0  f/u 28wks _1  f/u admit RPR _2  f/u PP RPR  . History of self-harm   . Polysubstance abuse [facility])     Past Surgical History:  Procedure Laterality Date  . TONSILLECTOMY     Family History:  Family History  Problem Relation Age of Onset  . Vision loss Mother   . Depression Mother   . Ulcers Mother   . Gallbladder disease Mother   . Mental illness Mother   . Thyroid disease Mother   . Allergies Father   . Vision loss Father   . Vision loss Maternal Grandmother   . Ulcers Maternal Grandmother   . Gallbladder disease Maternal Grandmother   . Diabetes Maternal Grandmother   . Vision loss Maternal Grandfather   . Heart disease Maternal Grandfather   . Cancer Maternal Grandfather   . Allergies Paternal Grandmother   . Vision loss Paternal Grandmother   . Gallbladder disease Paternal Grandmother   . Arthritis Paternal Grandmother   . Cancer Paternal Grandmother   . Allergies Paternal Grandfather   . Vision loss Paternal Grandfather    Family Psychiatric  History: brother-depression Social History:  Social History   Substance and Sexual Activity  Alcohol Use No     Social History   Substance and Sexual Activity  Drug Use Yes  . Frequency: 5.0 times per week  . Types: Cocaine, Marijuana, Methamphetamines   Comment: Heroin    Social History   Socioeconomic History  . Marital status: Single    Spouse name:  Not on file  . Number of children: Not on file  . Years of education: Not on file  . Highest education level: Not on file  Occupational History  . Not on file  Social Needs  . Financial resource strain: Not on file  . Food insecurity:    Worry: Not on file    Inability: Not on file  . Transportation needs:    Medical: Not on file     Non-medical: Not on file  Tobacco Use  . Smoking status: Current Every Day Smoker    Packs/day: 0.00    Types: Cigarettes  . Smokeless tobacco: Never Used  Substance and Sexual Activity  . Alcohol use: No  . Drug use: Yes    Frequency: 5.0 times per week    Types: Cocaine, Marijuana, Methamphetamines    Comment: Heroin  . Sexual activity: Yes    Partners: Male  Lifestyle  . Physical activity:    Days per week: Not on file    Minutes per session: Not on file  . Stress: Not on file  Relationships  . Social connections:    Talks on phone: Not on file    Gets together: Not on file    Attends religious service: Not on file    Active member of club or organization: Not on file    Attends meetings of clubs or organizations: Not on file    Relationship status: Not on file  Other Topics Concern  . Not on file  Social History Narrative  . Not on file   Additional Social History: N/A    Allergies:   Allergies  Allergen Reactions  . Dextromethorphan Swelling    Labs:  Results for orders placed or performed during the hospital encounter of 07/12/18 (from the past 48 hour(s))  Comprehensive metabolic panel     Status: Abnormal   Collection Time: 07/12/18  2:15 PM  Result Value Ref Range   Sodium 139 135 - 145 mmol/L   Potassium 3.5 3.5 - 5.1 mmol/L   Chloride 102 98 - 111 mmol/L    Comment: Please note change in reference range.   CO2 27 22 - 32 mmol/L   Glucose, Bld 95 70 - 99 mg/dL    Comment: Please note change in reference range.   BUN 15 6 - 20 mg/dL    Comment: Please note change in reference range.   Creatinine, Ser 1.06 (H) 0.44 - 1.00 mg/dL   Calcium 9.4 8.9 - 10.3 mg/dL   Total Protein 7.7 6.5 - 8.1 g/dL   Albumin 4.5 3.5 - 5.0 g/dL   AST 45 (H) 15 - 41 U/L   ALT 53 (H) 0 - 44 U/L    Comment: Please note change in reference range.   Alkaline Phosphatase 55 38 - 126 U/L   Total Bilirubin 1.2 0.3 - 1.2 mg/dL   GFR calc non Af Amer >60 >60 mL/min   GFR calc  Af Amer >60 >60 mL/min    Comment: (NOTE) The eGFR has been calculated using the CKD EPI equation. This calculation has not been validated in all clinical situations. eGFR's persistently <60 mL/min signify possible Chronic Kidney Disease.    Anion gap 10 5 - 15    Comment: Performed at [facility], [place] [address] [place], [place] [address]  Ethanol     Status: None   Collection Time: 07/12/18  2:15 PM  Result Value Ref Range   Alcohol, Ethyl (B) <10 <10  mg/dL    Comment: (NOTE) Lowest detectable limit for serum alcohol is 10 mg/dL. For medical purposes only. Performed at [facility], [place] [address], [place], [place] [address]   Salicylate level     Status: None   Collection Time: 07/12/18  2:15 PM  Result Value Ref Range   Salicylate Lvl <1.3 2.8 - 30.0 mg/dL    Comment: Performed at [facility], [place] [address], [place], [place] [address]  Acetaminophen level     Status: Abnormal   Collection Time: 07/12/18  2:15 PM  Result Value Ref Range   Acetaminophen (Tylenol), Serum <10 (L) 10 - 30 ug/mL    Comment: (NOTE) Therapeutic concentrations vary significantly. A range of 10-30 ug/mL  may be an effective concentration for many patients. However, some  are best treated at concentrations outside of this range. Acetaminophen concentrations >150 ug/mL at 4 hours after ingestion  and >50 ug/mL at 12 hours after ingestion are often associated with  toxic reactions. Performed at [facility], [place] [address] [place], [place] [address]   cbc     Status: None   Collection Time: 07/12/18  2:15 PM  Result Value Ref Range   WBC 7.2 4.0 - 10.5 K/uL   RBC 4.05 3.87 - 5.11 MIL/uL   Hemoglobin 12.6 12.0 - 15.0 g/dL   HCT 37.4 36.0 - 46.0 %   MCV 92.3 78.0 - 100.0 fL   MCH 31.1 26.0 - 34.0 pg   MCHC 33.7 30.0 - 36.0 g/dL   RDW 13.3 11.5 - 15.5 %   Platelets 343 150 - 400 K/uL    Comment: Performed at [facility], [place] [address], [place], [place] [address]  Rapid urine drug screen (hospital performed)     Status: Abnormal   Collection Time: 07/12/18  2:15 PM  Result Value Ref Range   Opiates POSITIVE (A) NONE DETECTED   Cocaine POSITIVE (A) NONE DETECTED   Benzodiazepines NONE DETECTED NONE DETECTED   Amphetamines NONE DETECTED NONE DETECTED   Tetrahydrocannabinol POSITIVE (A) NONE DETECTED   Barbiturates (A) NONE DETECTED    Result not available. Reagent lot number recalled by manufacturer.    Comment: Performed at [facility], [place] [address], [place], [place] [address]  Pregnancy, urine     Status: None   Collection Time: 07/12/18  2:15 PM  Result Value Ref Range   Preg Test, Ur NEGATIVE NEGATIVE    Comment:        THE SENSITIVITY OF THIS METHODOLOGY IS >20 mIU/mL. Performed at [facility], [place] [address], [place], [place] [address]     Current Facility-Administered Medications  Medication Dose Route Frequency Provider Last Rate Last Dose  . alum & mag hydroxide-simeth (MAALOX/MYLANTA) 200-200-20 MG/5ML suspension 30 mL  30 mL Oral Q6H PRN Street, [place], [place]      . cloNIDine (CATAPRES) tablet 0.1 mg  0.1 mg Oral QID Street, [place], PA-C   0.1 mg at 07/13/18 1036   Followed by  . [START ON 07/14/2018] cloNIDine (CATAPRES) tablet 0.1 mg  0.1 mg Oral BID [address], [place], [place]       Followed by  . [START ON 07/17/2018] cloNIDine (CATAPRES) tablet 0.1 mg  0.1 mg Oral Daily [address], [place], [place]      . dicyclomine (BENTYL) tablet 20 mg  20 mg Oral Q6H PRN Street, [place], [place]   20 mg at 07/12/18 2117  . gabapentin (NEURONTIN) capsule  300 mg  300 mg Oral TID [address], [place], [place]   300 mg at 07/13/18 1036  . hydrOXYzine (ATARAX/VISTARIL) tablet 25 mg  25 mg Oral Q6H PRN Street, [place], [place]   25 mg at 07/12/18 2117  . ibuprofen (ADVIL,MOTRIN) tablet 600 mg  600 mg Oral Q8H PRN Street, [place], [place]      . loperamide  (IMODIUM) capsule 2-4 mg  2-4 mg Oral PRN Street, [place], [place]      . methocarbamol (ROBAXIN) tablet 500 mg  500 mg Oral Q8H PRN Street, [place], [place]      . mirtazapine (REMERON) tablet 15 mg  15 mg Oral QHS [address], [place], [place]   15 mg at 07/12/18 2116  . nicotine (NICODERM CQ - dosed in mg/24 hours) patch 21 mg  21 mg Transdermal Daily [address], [place], [place]-C   21 mg at 07/13/18 1036  . ondansetron (ZOFRAN-ODT) disintegrating tablet 4 mg  4 mg Oral Q6H PRN Street, [place], [place]      . risperiDONE (RISPERDAL) tablet 1 mg  1 mg Oral QHS [address], [place], [place]   1 mg at 07/12/18 2118  . traZODone (DESYREL) tablet 50 mg  50 mg Oral QHS PRN Street, [place], [place]   50 mg at 07/12/18 2117   Current Outpatient Medications  Medication Sig Dispense Refill  . gabapentin (NEURONTIN) 300 MG capsule Take 1 capsule (300 mg total) by mouth 3 (three) times daily. For agitation 90 capsule 0  . hydrOXYzine (ATARAX/VISTARIL) 25 MG tablet Take 1 tablet (25 mg total) by mouth every 6 (six) hours as needed for anxiety. 60 tablet 0  . mirtazapine (REMERON) 15 MG tablet Take 1 tablet (15 mg total) by mouth at bedtime. For depression/sleep 30 tablet 0  . nicotine polacrilex (NICORETTE) 2 MG gum Take 1 each (2 mg total) by mouth as needed for smoking cessation. (May purchase from over the counter): For smoking cessation 100 tablet 0  . risperiDONE (RISPERDAL) 1 MG tablet Take 1 tablet (1 mg total) by mouth at bedtime. For mood control 30 tablet 0  . traZODone (DESYREL) 50 MG tablet Take 1 tablet (50 mg total) by mouth at bedtime as needed for sleep. 30 tablet 0    Musculoskeletal: Strength & Muscle Tone: within normal limits Gait & Station: normal Patient leans: N/A  Psychiatric Specialty Exam: Physical Exam  Nursing note and vitals reviewed. Constitutional: She is oriented to person, place, and time. She appears well-developed and well-nourished.  HENT:  Head: Normocephalic and atraumatic.  Neck: Normal  range of motion.  Respiratory: Effort normal.  Musculoskeletal: Normal range of motion.  Neurological: She is alert and oriented to person, place, and time.  Psychiatric: Her speech is normal and behavior is normal. Judgment and thought content normal. Her mood appears anxious. Cognition and memory are normal.    Review of Systems  Psychiatric/Behavioral: Positive for substance abuse. The patient is nervous/anxious.   All other systems reviewed and are negative.   Blood pressure 109/62, pulse 64, temperature 97.8 F (36.6 C), temperature source Oral, resp. rate 12, SpO2 100 %, not currently breastfeeding.There is no height or weight on file to calculate BMI.  General Appearance: Casual  Eye Contact:  Good  Speech:  Normal Rate  Volume:  Normal  Mood:  Anxious  Affect:  Congruent  Thought Process:  Coherent and Descriptions of Associations: Intact  Orientation:  Full (Time, Place, and Person)  Thought Content:  WDL and Logical  Suicidal Thoughts:  No  Homicidal Thoughts:  No  Memory:  Immediate;   Good Recent;   Good Remote;   Good  Judgement:  Fair  Insight:  Good  Psychomotor Activity:  Normal  Concentration:  Concentration: Good and Attention Span: Good  Recall:  Good  Fund of Knowledge:  Good  Language:  Good  Akathisia:  No  Handed:  Right  AIMS (if indicated):   N/A  Assets:  Leisure Time Physical Health Resilience Social Support  ADL's:  Intact  Cognition:  WNL  Sleep:   N/A     Treatment Plan Summary: Polysubstance dependence including opioid type drug without complication, episodic abuse (HCC) -Started Clondine protocol for withdrawal symptoms -Started Gabapentin 300 mg TID for substance abuse -Continued Risperdal 1 mg at bedtime for mood stabilization -Continued Remeron 15 mg at bedtime for sleep and mood  Disposition: No evidence of imminent risk to self or others at present.    Waylan Boga, NP 07/13/2018 10:50 AM   Patient seen face-to-face for  psychiatric evaluation, chart reviewed and case discussed with the physician extender and developed treatment plan. Reviewed the information documented and agree with the treatment plan.  Buford Dresser, DO 07/13/18 5:45 PM

## 2018-07-13 NOTE — Patient Outreach (Signed)
[facility] was about to meet with the patient for for substance use recovery support and help with recovery resources. Nurse informed [facility] that she is not interested in meeting with peer support. [facility] still gave the nurse information to [facility] and [facility] contact information to give to the patient upon discharge.

## 2018-07-13 NOTE — ED Notes (Signed)
Patient refused peer support prior to discharge.

## 2018-07-13 NOTE — [facility] Suicide Risk Assessment (Signed)
Suicide Risk Assessment  Discharge Assessment   [facility]   Principal Problem: Polysubstance dependence including opioid type drug without complication, episodic abuse [facility](HCC) Discharge Diagnoses:  Patient Active Problem List   Diagnosis Date Noted  . Polysubstance dependence including opioid type drug without complication, episodic abuse (HCC) [F19.20] 12/16/2017    Priority: High  . Substance induced mood disorder (HCC) [F19.94] 04/19/2018  . Polysubstance (including opioids) dependence, daily use (H[facility]) [F19.20] 02/03/2018  . Major depressive disorder, recurrent severe without psychotic features (HCC) [F33.2] 12/16/2017  . Normal labor [O80, Z37.9] 10/16/2016  . Cigarette smoker [F17.210] 08/18/2016  . 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 Hepatitis C, chronic, maternal, antepartum (HCC) [O98.419, B18.2] 08/04/2016  . Supervision of high-risk pregnancy [O09.90] 07/21/2016  . History of substance abuse [Z87.898] 07/21/2016  . ASCUS with positive high risk HPV [IMO0002] 07/21/2016    Total Time spent with patient: 45 minutes    Musculoskeletal: Strength & Muscle Tone: within normal limits Gait & Station: normal Patient leans: N/A  Psychiatric Specialty Exam: Physical Exam  Nursing note and vitals reviewed. Constitutional: She is oriented to person, place, and time. She appears well-developed and well-nourished.  HENT:  Head: Normocephalic.  Neck: Normal range of motion.  Respiratory: Effort normal.  Musculoskeletal: Normal range of motion.  Neurological: She is alert and oriented to person, place, and time.  Psychiatric: Her speech is normal and behavior is normal. Judgment and thought content normal. Her mood appears anxious. Cognition and memory are normal.    Review of Systems  Psychiatric/Behavioral: Positive for substance abuse. The patient is nervous/anxious.   All other systems  reviewed and are negative.   Blood pressure 109/62, pulse 64, temperature 97.8 F (36.6 C), temperature source Oral, resp. rate 12, SpO2 100 %, not currently breastfeeding.There is no height or weight on file to calculate BMI.  General Appearance: Casual  Eye Contact:  Good  Speech:  Normal Rate  Volume:  Normal  Mood:  Anxious  Affect:  Congruent  Thought Process:  Coherent and Descriptions of Associations: Intact  Orientation:  Full (Time, Place, and Person)  Thought Content:  WDL and Logical  Suicidal Thoughts:  No  Homicidal Thoughts:  No  Memory:  Immediate;   Good Recent;   Good Remote;   Good  Judgement:  Fair  Insight:  Good  Psychomotor Activity:  Normal  Concentration:  Concentration: Good and Attention Span: Good  Recall:  Good  Fund of Knowledge:  Good  Language:  Good  Akathisia:  No  Handed:  Right  AIMS (if indicated):     Assets:  Leisure Time Physical Health Resilience Social Support  ADL's:  Intact  Cognition:  WNL  Sleep:          Mental Status Per Nursing Assessment::   On Admission:   suicidal ideatoins and substance abuse  Demographic Factors:  Caucasian  Loss Factors: NA  Historical Factors: NA  Risk Reduction Factors:   Sense of responsibility to family and Positive therapeutic relationship  Continued Clinical Symptoms:  Anxiety, mild  Cognitive Features That Contribute To Risk:  None    Suicide Risk:  Minimal: No identifiable suicidal ideation.  Patients presenting with no risk factors but with morbid ruminations; may be classified as minimal risk based on the severity of the depressive symptoms    Plan Of Care/Follow-up recommendations:  Activity:  as tolerated Diet:  heart healthy diet  Nanine MeansLORD, JAMISON, NP 07/13/2018,  4:58 PM

## 2018-09-16 ENCOUNTER — Other Ambulatory Visit: Payer: Self-pay

## 2018-09-16 ENCOUNTER — Emergency Department (HOSPITAL_COMMUNITY)
Admission: EM | Admit: 2018-09-16 | Discharge: 2018-09-16 | Disposition: A | Discharge status: Home / Self Care | Payer: Medicaid Other | Attending: Emergency Medicine | Admitting: Emergency Medicine

## 2018-09-16 ENCOUNTER — Encounter (HOSPITAL_COMMUNITY): Payer: Self-pay

## 2018-09-16 DIAGNOSIS — N760 Acute vaginitis: Secondary | ICD-10-CM | POA: Diagnosis not present

## 2018-09-16 DIAGNOSIS — Z79899 Other long term (current) drug therapy: Secondary | ICD-10-CM | POA: Diagnosis not present

## 2018-09-16 DIAGNOSIS — F17210 Nicotine dependence, cigarettes, uncomplicated: Secondary | ICD-10-CM | POA: Insufficient documentation

## 2018-09-16 DIAGNOSIS — N898 Other specified noninflammatory disorders of vagina: Secondary | ICD-10-CM | POA: Diagnosis present

## 2018-09-16 LAB — URINALYSIS, ROUTINE W REFLEX MICROSCOPIC
Bilirubin Urine: NEGATIVE
Glucose, UA: NEGATIVE mg/dL
Hgb urine dipstick: NEGATIVE
Ketones, ur: NEGATIVE mg/dL
LEUKOCYTES UA: NEGATIVE
NITRITE: NEGATIVE
Protein, ur: NEGATIVE mg/dL
SPECIFIC GRAVITY, URINE: 1.011 (ref 1.005–1.030)
pH: 5 (ref 5.0–8.0)

## 2018-09-16 LAB — WET PREP, GENITAL
CLUE CELLS WET PREP: NONE SEEN
SPERM: NONE SEEN
TRICH WET PREP: NONE SEEN
Yeast Wet Prep HPF POC: NONE SEEN

## 2018-09-16 LAB — I-STAT BETA HCG BLOOD, ED (MC, WL, AP ONLY): I-stat hCG, quantitative: <5 m[IU]/mL (ref <5)

## 2018-09-16 MED ORDER — AZITHROMYCIN 250 MG PO TABS
1000.0000 mg | ORAL_TABLET | Freq: Once | ORAL | Status: AC
  Administered 2018-09-16: 1000 mg via ORAL
  Filled 2018-09-16: qty 4

## 2018-09-16 MED ORDER — STERILE WATER FOR INJECTION IJ SOLN
INTRAMUSCULAR | Status: AC
  Administered 2018-09-16: 1 mL
  Filled 2018-09-16: qty 10

## 2018-09-16 MED ORDER — CEFTRIAXONE SODIUM 250 MG IJ SOLR
250.0000 mg | Freq: Once | INTRAMUSCULAR | Status: AC
  Administered 2018-09-16: 250 mg via INTRAMUSCULAR
  Filled 2018-09-16: qty 250

## 2018-09-16 NOTE — ED Provider Notes (Signed)
[facility]-EMERGENCY DEPT Provider Note   CSN: [id number] Arrival date & time: 09/16/18  1326     History   Chief Complaint No chief complaint on file.   HPI KYIA RHUDE is a 27 y.o. female.  Patient is 27 year old female who presents with vaginal discharge.  She has complaints of foul-smelling white vaginal discharge that has been going on about 2 months.  She also has some itching in her vaginal area.  She denies abdominal pain.  No nausea or vomiting.  No urinary symptoms.  She is sexually active.  She is been trying probiotics without improvement in symptoms.     Past Medical History:  Diagnosis Date  . History of RPR test 07/21/2016   02/2016: 1:1 with negative TPA testing [ ]  f/u 28wks [ ]  f/u admit RPR [ ]  f/u PP RPR  . History of self-harm   . Polysubstance abuse [facility])     Patient Active Problem List   Diagnosis Date Noted  . Substance induced mood disorder (HCC) 04/19/2018  . Polysubstance (including opioids) dependence, daily use (HCC) 02/03/2018  . Polysubstance dependence including opioid type drug without complication, episodic abuse (HCC) 12/16/2017  . 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 Normal labor 10/16/2016  . Cigarette smoker 08/18/2016  . Pregnancy complicated by subutex maintenance, antepartum (HCC) 08/04/2016  . Hepatitis C, chronic, maternal, antepartum (HCC) 08/04/2016  . Supervision of high-risk pregnancy 07/21/2016  . History of substance abuse 07/21/2016  . ASCUS with positive high risk HPV 07/21/2016    Past Surgical History:  Procedure Laterality Date  . TONSILLECTOMY       OB History    Gravida  2   Para  2   Term  2   Preterm      AB      Living  2     SAB      TAB      Ectopic      Multiple  0   Live Births  2            Home Medications    Prior to Admission medications   Medication Sig Start Date End Date Taking? Authorizing  Provider  gabapentin (NEURONTIN) 400 MG capsule Take 400 mg by mouth 3 (three) times daily.   Yes [provider]  ibuprofen (ADVIL,MOTRIN) 200 MG tablet Take 200 mg by mouth every 6 (six) hours as needed for moderate pain.   Yes [provider]  mirtazapine (REMERON) 30 MG tablet Take 30 mg by mouth at bedtime.   Yes [provider]  gabapentin (NEURONTIN) 300 MG capsule Take 1 capsule (300 mg total) by mouth 3 (three) times daily. Patient not taking: Reported on 09/16/2018 07/13/18   Charm Rings, NP  hydrOXYzine (ATARAX/VISTARIL) 25 MG tablet Take 1 tablet (25 mg total) by mouth every 6 (six) hours as needed for anxiety. Patient not taking: Reported on 09/16/2018 07/13/18   Charm Rings, NP  mirtazapine (REMERON) 15 MG tablet Take 1 tablet (15 mg total) by mouth at bedtime. Patient not taking: Reported on 09/16/2018 07/13/18   Charm Rings, NP  nicotine polacrilex (NICORETTE) 2 MG gum Take 1 each (2 mg total) by mouth as needed for smoking cessation. (May purchase from over the counter): For smoking cessation Patient not taking: Reported on 09/16/2018 04/21/18   Armandina Stammer I, NP  risperiDONE (RISPERDAL) 1 MG tablet Take 1 tablet (1 mg total)  by mouth at bedtime. For mood control Patient not taking: Reported on 09/16/2018 04/21/18   Armandina Stammer I, NP  risperiDONE (RISPERDAL) 1 MG tablet Take 1 tablet (1 mg total) by mouth at bedtime. Patient not taking: Reported on 09/16/2018 07/13/18   Charm Rings, NP  traZODone (DESYREL) 50 MG tablet Take 1 tablet (50 mg total) by mouth at bedtime as needed for sleep. Patient not taking: Reported on 09/16/2018 07/13/18   Charm Rings, NP    Family History Family History  Problem Relation Age of Onset  . Vision loss Mother   . Depression Mother   . Ulcers Mother   . Gallbladder disease Mother   . Mental illness Mother   . Thyroid disease Mother   . Allergies Father   . Vision loss Father   . Vision loss Maternal  Grandmother   . Ulcers Maternal Grandmother   . Gallbladder disease Maternal Grandmother   . Diabetes Maternal Grandmother   . Vision loss Maternal Grandfather   . Heart disease Maternal Grandfather   . Cancer Maternal Grandfather   . Allergies Paternal Grandmother   . Vision loss Paternal Grandmother   . Gallbladder disease Paternal Grandmother   . Arthritis Paternal Grandmother   . Cancer Paternal Grandmother   . Allergies Paternal Grandfather   . Vision loss Paternal Grandfather     Social History Social History   Tobacco Use  . Smoking status: Current Every Day Smoker    Packs/day: 0.00    Types: Cigarettes  . Smokeless tobacco: Never Used  Substance Use Topics  . Alcohol use: No  . Drug use: Yes    Frequency: 5.0 times per week    Types: Cocaine, Marijuana, Methamphetamines    Comment: Heroin     Allergies   Dextromethorphan   Review of Systems Review of Systems  Constitutional: Negative for chills, diaphoresis, fatigue and fever.  HENT: Negative for congestion, rhinorrhea and sneezing.   Eyes: Negative.   Respiratory: Negative for cough, chest tightness and shortness of breath.   Cardiovascular: Negative for chest pain and leg swelling.  Gastrointestinal: Negative for abdominal pain, blood in stool, diarrhea, nausea and vomiting.  Genitourinary: Positive for vaginal discharge. Negative for difficulty urinating, flank pain, frequency, hematuria and vaginal bleeding.  Musculoskeletal: Negative for arthralgias and back pain.  Skin: Negative for rash.  Neurological: Negative for dizziness, speech difficulty, weakness, numbness and headaches.     Physical Exam Updated Vital Signs BP 107/68   Pulse 77   Temp 98.3 F (36.8 C) (Oral)   Resp 17   Ht 5' (1.524 m)   Wt 47.6 kg   LMP 08/31/2018   SpO2 100%   BMI 20.51 kg/m   Physical Exam  Constitutional: She is oriented to person, place, and time. She appears well-developed and well-nourished.  HENT:    Head: Normocephalic and atraumatic.  Eyes: Pupils are equal, round, and reactive to light.  Neck: Normal range of motion. Neck supple.  Cardiovascular: Normal rate, regular rhythm and normal heart sounds.  Pulmonary/Chest: Effort normal and breath sounds normal. No respiratory distress. She has no wheezes. She has no rales. She exhibits no tenderness.  Abdominal: Soft. Bowel sounds are normal. There is no tenderness. There is no rebound and no guarding.  Genitourinary:  Genitourinary Comments: Thin white vaginal discharge, no CMT or adnexal tenderness.  No lesions visualized  Musculoskeletal: Normal range of motion. She exhibits no edema.  Lymphadenopathy:    She has no cervical adenopathy.  Neurological: She is alert and oriented to person, place, and time.  Skin: Skin is warm and dry. No rash noted.  Psychiatric: She has a normal mood and affect.     ED Treatments / Results  Labs (all labs ordered are listed, but only abnormal results are displayed) Labs Reviewed  WET PREP, GENITAL - Abnormal; Notable for the following components:      Result Value   WBC, Wet Prep HPF POC FEW (*)    All other components within normal limits  URINALYSIS, ROUTINE W REFLEX MICROSCOPIC  RPR  HIV ANTIBODY (ROUTINE TESTING W REFLEX)  I-STAT BETA HCG BLOOD, ED ([facility], [facility], [facility] ONLY)  GC/CHLAMYDIA PROBE AMP ([place]) NOT AT [facility]    EKG None  Radiology No results found.  Procedures Procedures (including critical care time)  Medications Ordered in ED Medications  cefTRIAXone (ROCEPHIN) injection 250 mg (has no administration in time range)  azithromycin (ZITHROMAX) tablet 1,000 mg (has no administration in time range)     Initial Impression / Assessment and Plan / ED Course  I have reviewed the triage vital signs and the nursing notes.  Pertinent labs & imaging results that were available during my care of the patient were reviewed by me and considered in my medical decision making (see  chart for details).    Patient is 27 year old female who presents with a 4671-month history of vaginal discharge.  Wet prep is negative.  She was treated presumptively with Rocephin and Zithromax.  She was encouraged to follow-up with her PCP if her symptoms are not improving.  STD labs were sent.  Return precautions given.  Final Clinical Impressions(s) / ED Diagnoses   Final diagnoses:  Acute vaginitis    ED Discharge Orders    None       Rolan BuccoBelfi, Fredda Clarida, MD 09/16/18 [phone]

## 2018-09-16 NOTE — ED Triage Notes (Signed)
Patient c/o vaginal discharge white in color with odor to it. Pt state it started about 2 months ago and she try over the counter medication with no relief. Pt state it also itching bad.

## 2018-09-16 NOTE — ED Notes (Signed)
Pelvic cart at bedside. 

## 2018-09-17 LAB — RPR: RPR Ser Ql: NONREACTIVE

## 2018-09-17 LAB — HIV ANTIBODY (ROUTINE TESTING W REFLEX): HIV Screen 4th Generation wRfx: NONREACTIVE

## 2018-09-19 LAB — GC/CHLAMYDIA PROBE AMP (~~LOC~~) NOT AT ARMC
CHLAMYDIA, DNA PROBE: NEGATIVE
NEISSERIA GONORRHEA: NEGATIVE

## 2019-04-20 ENCOUNTER — Encounter: Payer: Self-pay | Admitting: Family Medicine

## 2019-06-21 ENCOUNTER — Emergency Department (HOSPITAL_BASED_OUTPATIENT_CLINIC_OR_DEPARTMENT_OTHER)
Admission: EM | Admit: 2019-06-21 | Discharge: 2019-06-21 | Disposition: A | Discharge status: Home / Self Care | Payer: Medicaid Other | Attending: Emergency Medicine | Admitting: Emergency Medicine

## 2019-06-21 ENCOUNTER — Other Ambulatory Visit: Payer: Self-pay

## 2019-06-21 ENCOUNTER — Encounter (HOSPITAL_BASED_OUTPATIENT_CLINIC_OR_DEPARTMENT_OTHER): Payer: Self-pay

## 2019-06-21 ENCOUNTER — Emergency Department (HOSPITAL_BASED_OUTPATIENT_CLINIC_OR_DEPARTMENT_OTHER): Payer: Medicaid Other

## 2019-06-21 DIAGNOSIS — Z5321 Procedure and treatment not carried out due to patient leaving prior to being seen by health care provider: Secondary | ICD-10-CM | POA: Insufficient documentation

## 2019-06-21 DIAGNOSIS — M25569 Pain in unspecified knee: Secondary | ICD-10-CM | POA: Diagnosis present

## 2019-06-21 HISTORY — DX: Opioid abuse, uncomplicated: F11.10

## 2019-06-21 NOTE — ED Triage Notes (Signed)
Pt c/o pain to left knee pain started after ?injury the day before-NAD-to triage in w/c

## 2019-10-03 ENCOUNTER — Emergency Department (HOSPITAL_COMMUNITY)
Admission: EM | Admit: 2019-10-03 | Discharge: 2019-10-04 | Discharge status: Absent without leave | Payer: Medicaid Other

## 2019-10-03 ENCOUNTER — Other Ambulatory Visit: Payer: Self-pay

## 2019-10-03 NOTE — ED Notes (Signed)
Pt did not respond when called back to triage and appears to not be in the lobby or outside the ED

## 2019-10-22 ENCOUNTER — Emergency Department (HOSPITAL_COMMUNITY)
Admission: EM | Admit: 2019-10-22 | Discharge: 2019-10-22 | Disposition: A | Discharge status: Home / Self Care | Payer: Medicaid Other | Attending: Emergency Medicine | Admitting: Emergency Medicine

## 2019-10-22 ENCOUNTER — Emergency Department (HOSPITAL_COMMUNITY): Payer: Medicaid Other

## 2019-10-22 ENCOUNTER — Encounter (HOSPITAL_COMMUNITY): Payer: Self-pay | Admitting: *Deleted

## 2019-10-22 ENCOUNTER — Other Ambulatory Visit: Payer: Self-pay

## 2019-10-22 DIAGNOSIS — I749 Embolism and thrombosis of unspecified artery: Secondary | ICD-10-CM | POA: Insufficient documentation

## 2019-10-22 DIAGNOSIS — R21 Rash and other nonspecific skin eruption: Secondary | ICD-10-CM | POA: Diagnosis present

## 2019-10-22 DIAGNOSIS — D692 Other nonthrombocytopenic purpura: Secondary | ICD-10-CM | POA: Diagnosis not present

## 2019-10-22 DIAGNOSIS — I76 Septic arterial embolism: Secondary | ICD-10-CM

## 2019-10-22 DIAGNOSIS — Z79899 Other long term (current) drug therapy: Secondary | ICD-10-CM | POA: Insufficient documentation

## 2019-10-22 DIAGNOSIS — F17210 Nicotine dependence, cigarettes, uncomplicated: Secondary | ICD-10-CM | POA: Insufficient documentation

## 2019-10-22 DIAGNOSIS — J852 Abscess of lung without pneumonia: Secondary | ICD-10-CM | POA: Diagnosis not present

## 2019-10-22 LAB — CBC WITH DIFFERENTIAL/PLATELET
Abs Immature Granulocytes: 0.03 10*3/uL (ref 0.00–0.07)
Basophils Absolute: 0 10*3/uL (ref 0.0–0.1)
Basophils Relative: 0 %
Eosinophils Absolute: 0.1 10*3/uL (ref 0.0–0.5)
Eosinophils Relative: 1 %
HCT: 31 % — ABNORMAL LOW (ref 36.0–46.0)
Hemoglobin: 9.5 g/dL — ABNORMAL LOW (ref 12.0–15.0)
Immature Granulocytes: 0 %
Lymphocytes Relative: 27 %
Lymphs Abs: 1.9 10*3/uL (ref 0.7–4.0)
MCH: 26.3 pg (ref 26.0–34.0)
MCHC: 30.6 g/dL (ref 30.0–36.0)
MCV: 85.9 fL (ref 80.0–100.0)
Monocytes Absolute: 0.4 10*3/uL (ref 0.1–1.0)
Monocytes Relative: 6 %
Neutro Abs: 4.6 10*3/uL (ref 1.7–7.7)
Neutrophils Relative %: 66 %
Platelets: 74 10*3/uL — ABNORMAL LOW (ref 150–400)
RBC: 3.61 MIL/uL — ABNORMAL LOW (ref 3.87–5.11)
RDW: 15.2 % (ref 11.5–15.5)
WBC: 7 10*3/uL (ref 4.0–10.5)
nRBC: 0 % (ref 0.0–0.2)

## 2019-10-22 LAB — COMPREHENSIVE METABOLIC PANEL
ALT: 16 U/L (ref 0–44)
AST: 22 U/L (ref 15–41)
Albumin: 2.2 g/dL — ABNORMAL LOW (ref 3.5–5.0)
Alkaline Phosphatase: 60 U/L (ref 38–126)
Anion gap: 11 (ref 5–15)
BUN: 6 mg/dL (ref 6–20)
CO2: 26 mmol/L (ref 22–32)
Calcium: 8.4 mg/dL — ABNORMAL LOW (ref 8.9–10.3)
Chloride: 92 mmol/L — ABNORMAL LOW (ref 98–111)
Creatinine, Ser: 0.81 mg/dL (ref 0.44–1.00)
GFR calc Af Amer: >60 mL/min (ref >60)
GFR calc non Af Amer: >60 mL/min (ref >60)
Glucose, Bld: 107 mg/dL — ABNORMAL HIGH (ref 70–99)
Potassium: 4 mmol/L (ref 3.5–5.1)
Sodium: 129 mmol/L — ABNORMAL LOW (ref 135–145)
Total Bilirubin: 0.4 mg/dL (ref 0.3–1.2)
Total Protein: 7.9 g/dL (ref 6.5–8.1)

## 2019-10-22 LAB — URINALYSIS, ROUTINE W REFLEX MICROSCOPIC
Bilirubin Urine: NEGATIVE
Glucose, UA: NEGATIVE mg/dL
Ketones, ur: NEGATIVE mg/dL
Nitrite: NEGATIVE
Protein, ur: 30 mg/dL — AB
Specific Gravity, Urine: 1.02 (ref 1.005–1.030)
WBC, UA: >50 WBC/hpf — ABNORMAL HIGH (ref 0–5)
pH: 5 (ref 5.0–8.0)

## 2019-10-22 LAB — PROTIME-INR
INR: 1 (ref 0.8–1.2)
Prothrombin Time: 13.5 seconds (ref 11.4–15.2)

## 2019-10-22 LAB — APTT: aPTT: 39 seconds — ABNORMAL HIGH (ref 24–36)

## 2019-10-22 LAB — I-STAT BETA HCG BLOOD, ED (MC, WL, AP ONLY): I-stat hCG, quantitative: <5 m[IU]/mL (ref <5)

## 2019-10-22 LAB — LACTIC ACID, PLASMA: Lactic Acid, Venous: 1.1 mmol/L (ref 0.5–1.9)

## 2019-10-22 MED ORDER — LORAZEPAM 2 MG/ML IJ SOLN: 1.0000 mg | Freq: Once | INTRAMUSCULAR | Status: DC

## 2019-10-22 MED ORDER — SODIUM CHLORIDE 0.9% FLUSH: 3.0000 mL | Freq: Once | INTRAVENOUS | Status: DC

## 2019-10-22 NOTE — ED Triage Notes (Addendum)
Pt states rash to legs x 3 days.  Now legs are swollen.  HR 124.  Pt is IV drug user.

## 2019-10-22 NOTE — ED Provider Notes (Signed)
[facility] Provider Note   CSN: [id number] Arrival date & time: 10/22/19  1542     History   Chief Complaint Chief Complaint  Patient presents with  . Rash    HPI [name] is a 28 y.o. female.     HPI   Patient is a 28 year old female with a history of polysubstance use, who presents to the emergency department today for evaluation of a rash to her bilateral lower extremities that has been present for the last 3 days. The rash is painful and erythematous.  She does report that she had a fever last week up to 102F.  This is since improved.  Last week she also had a cough which she states is improving.  She denies any chest pain or shortness of breath.  Denies headaches, numbness/weakness, visual changes or ataxia.  Denies abdominal pain nausea vomiting or urinary symptoms.  Has history of polysubstance use and last used earlier today.   Past Medical History:  Diagnosis Date  . History of RPR test 07/21/2016   02/2016: 1:1 with negative TPA testing [ ]  f/u 28wks [ ]  f/u admit RPR [ ]  f/u PP RPR  . History of self-harm   . Opioid abuse (HCC)   . Polysubstance abuse [place](HCC)     Patient Active Problem List   Diagnosis Date Noted  . Substance induced mood disorder (HCC) 04/19/2018  . Polysubstance (including opioids) dependence, daily use (HCC) 02/03/2018  . Polysubstance dependence including opioid type drug without complication, episodic abuse (HCC) 12/16/2017  . 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 Normal labor 10/16/2016  . Cigarette smoker 08/18/2016  . Pregnancy complicated by subutex maintenance, antepartum (HCC) 08/04/2016  . Hepatitis C, chronic, maternal, antepartum (HCC) 08/04/2016  . Supervision of high-risk pregnancy 07/21/2016  . History of substance abuse (HCC) 07/21/2016  . ASCUS with positive high risk HPV 07/21/2016    Past Surgical History:  Procedure  Laterality Date  . TONSILLECTOMY       OB History    Gravida  2   Para  2   Term  2   Preterm      AB      Living  2     SAB      TAB      Ectopic      Multiple  0   Live Births  2            Home Medications    Prior to Admission medications   Medication Sig Start Date End Date Taking? Authorizing Provider  gabapentin (NEURONTIN) 300 MG capsule Take 1 capsule (300 mg total) by mouth 3 (three) times daily. Patient not taking: Reported on 09/16/2018 07/13/18   Charm RingsLord, Jamison Y, NP  gabapentin (NEURONTIN) 400 MG capsule Take 400 mg by mouth 3 (three) times daily.    [provider]  hydrOXYzine (ATARAX/VISTARIL) 25 MG tablet Take 1 tablet (25 mg total) by mouth every 6 (six) hours as needed for anxiety. Patient not taking: Reported on 09/16/2018 07/13/18   Charm RingsLord, Jamison Y, NP  ibuprofen (ADVIL,MOTRIN) 200 MG tablet Take 200 mg by mouth every 6 (six) hours as needed for moderate pain.    [provider]  mirtazapine (REMERON) 15 MG tablet Take 1 tablet (15 mg total) by mouth at bedtime. Patient not taking: Reported on 09/16/2018 07/13/18   Charm RingsLord, Jamison Y, NP  mirtazapine (REMERON) 30 MG tablet Take 30  mg by mouth at bedtime.    [provider]  nicotine polacrilex (NICORETTE) 2 MG gum Take 1 each (2 mg total) by mouth as needed for smoking cessation. (May purchase from over the counter): For smoking cessation Patient not taking: Reported on 09/16/2018 04/21/18   Lindell Spar I, NP  risperiDONE (RISPERDAL) 1 MG tablet Take 1 tablet (1 mg total) by mouth at bedtime. For mood control Patient not taking: Reported on 09/16/2018 04/21/18   Lindell Spar I, NP  risperiDONE (RISPERDAL) 1 MG tablet Take 1 tablet (1 mg total) by mouth at bedtime. Patient not taking: Reported on 09/16/2018 07/13/18   Patrecia Pour, NP  traZODone (DESYREL) 50 MG tablet Take 1 tablet (50 mg total) by mouth at bedtime as needed for sleep. Patient not taking: Reported on 09/16/2018  07/13/18   Patrecia Pour, NP    Family History Family History  Problem Relation Age of Onset  . Vision loss Mother   . Depression Mother   . Ulcers Mother   . Gallbladder disease Mother   . Mental illness Mother   . Thyroid disease Mother   . Allergies Father   . Vision loss Father   . Vision loss Maternal Grandmother   . Ulcers Maternal Grandmother   . Gallbladder disease Maternal Grandmother   . Diabetes Maternal Grandmother   . Vision loss Maternal Grandfather   . Heart disease Maternal Grandfather   . Cancer Maternal Grandfather   . Allergies Paternal Grandmother   . Vision loss Paternal Grandmother   . Gallbladder disease Paternal Grandmother   . Arthritis Paternal Grandmother   . Cancer Paternal Grandmother   . Allergies Paternal Grandfather   . Vision loss Paternal Grandfather     Social History Social History   Tobacco Use  . Smoking status: Current Every Day Smoker    Packs/day: 0.50    Types: Cigarettes  . Smokeless tobacco: Never Used  Substance Use Topics  . Alcohol use: No  . Drug use: Yes    Frequency: 5.0 times per week    Types: Cocaine, Marijuana, Methamphetamines    Comment: Heroin     Allergies   Dextromethorphan   Review of Systems Review of Systems  Constitutional: Positive for fever (resolved).  HENT: Negative for ear pain and sore throat.   Eyes: Negative for visual disturbance.  Respiratory: Positive for cough (improving). Negative for shortness of breath.   Cardiovascular: Negative for chest pain.  Gastrointestinal: Negative for abdominal pain, constipation, diarrhea, nausea and vomiting.  Genitourinary: Negative for dysuria and hematuria.  Skin: Positive for rash.  Neurological: Negative for dizziness, weakness, numbness and headaches.  All other systems reviewed and are negative.    Physical Exam Updated Vital Signs BP 111/68 (BP Location: Right Arm)   Pulse (!) 124   Temp 98.6 F (37 C) (Oral)   Resp 16   Ht 5'  (1.524 m)   Wt 45.4 kg   LMP 09/12/2019   SpO2 100%   BMI 19.53 kg/m   Physical Exam Vitals signs and nursing note reviewed.  Constitutional:      General: She is not in acute distress.    Appearance: She is well-developed.  HENT:     Head: Normocephalic and atraumatic.  Eyes:     Conjunctiva/sclera: Conjunctivae normal.  Neck:     Musculoskeletal: Neck supple.  Cardiovascular:     Rate and Rhythm: Regular rhythm. Tachycardia present.     Heart sounds: Normal heart sounds. No  murmur.  Pulmonary:     Effort: Pulmonary effort is normal. No respiratory distress.     Breath sounds: Normal breath sounds. No wheezing, rhonchi or rales.  Abdominal:     General: Bowel sounds are normal. There is no distension.     Palpations: Abdomen is soft.     Tenderness: There is no abdominal tenderness. There is no guarding or rebound.  Musculoskeletal:        General: Tenderness present.     Right lower leg: Edema present.     Left lower leg: Edema present.  Skin:    General: Skin is warm and dry.     Findings: Rash present.     Comments: Palpable purpura to the BLE diffusely as pictured below. Also with nonpalpable purpura to the left forearm. New track marks to right [facility]. No surrounding erythema. No obvious Janeway lesions or Osler's nodes.   Neurological:     Mental Status: She is alert.  Psychiatric:     Comments: anxious         ED Treatments / Results  Labs (all labs ordered are listed, but only abnormal results are displayed) Labs Reviewed  COMPREHENSIVE METABOLIC PANEL - Abnormal; Notable for the following components:      Result Value   Sodium 129 (*)    Chloride 92 (*)    Glucose, Bld 107 (*)    Calcium 8.4 (*)    Albumin 2.2 (*)    All other components within normal limits  CBC WITH DIFFERENTIAL/PLATELET - Abnormal; Notable for the following components:   RBC 3.61 (*)    Hemoglobin 9.5 (*)    HCT 31.0 (*)    Platelets 74 (*)    All other components within normal  limits  URINALYSIS, ROUTINE W REFLEX MICROSCOPIC - Abnormal; Notable for the following components:   Color, Urine AMBER (*)    APPearance CLOUDY (*)    Hgb urine dipstick SMALL (*)    Protein, ur 30 (*)    Leukocytes,Ua MODERATE (*)    WBC, UA >50 (*)    Bacteria, UA RARE (*)    Non Squamous Epithelial 0-5 (*)    All other components within normal limits  APTT - Abnormal; Notable for the following components:   aPTT 39 (*)    All other components within normal limits  CULTURE, BLOOD (ROUTINE X 2)  CULTURE, BLOOD (ROUTINE X 2)  URINE CULTURE  LACTIC ACID, PLASMA  PROTIME-INR  LACTIC ACID, PLASMA  I-STAT BETA HCG BLOOD, ED ([facility], [facility], [facility] ONLY)    EKG None  Radiology Dg Chest Port 1 View  Result Date: 10/22/2019 CLINICAL DATA:  Rash, leg swelling, IVDU EXAM: PORTABLE CHEST 1 VIEW COMPARISON:  12/14/2012 FINDINGS: The heart size and mediastinal contours are within normal limits. There are multiple cavitary lesions of the bilateral lungs, most numerous in the left upper lobe. The visualized skeletal structures are unremarkable. IMPRESSION: There are multiple cavitary lesions of the bilateral lungs, most numerous in the left upper lobe. Findings are concerning for septic embolism in the reported setting of IVDU. Recommend CT to further evaluate. Electronically Signed   By: Lauralyn Primes M.D.   On: 10/22/2019 18:51    Procedures Procedures (including critical care time)  Medications Ordered in ED Medications  sodium chloride flush (NS) 0.9 % injection 3 mL (has no administration in time range)  LORazepam (ATIVAN) injection 1 mg (has no administration in time range)     Initial Impression / Assessment  and Plan / ED Course  I have reviewed the triage vital signs and the nursing notes.  Pertinent labs & imaging results that were available during my care of the patient were reviewed by me and considered in my medical decision making (see chart for details).   Final Clinical  Impressions(s) / ED Diagnoses   Final diagnoses:  Septic embolism (HCC)  Purpura (HCC)   28 year old female presenting for rash and swelling to the bilateral lower extremities for the last 3 days.  Has history of IV drug use.  Has significant purpuric rash to the BLE and to the left forearm. Pt tachycardic, no obvious murmur on exam. Lungs CTAB.   CBC without leukocytosis, hemoglobin 9.5 which is new from prior labs a year ago CMP with hyponatremia, sodium 129, hypochloremia chloride 92 otherwise normal kidney and liver function Beta-hCG negative Lactic acid negative UA with moderate leukocytes, 21-50 RBCs, greater than 50 WBCs, rare bacteria.  Concern for UTI  - Urine culture added. Blood cultures obtained  CXR with multiple cavitary lesions of the bilateral lungs, most numerous in the left upper lobe. Findings are concerning for septic embolism in the reported setting of IVDU. Recommend CT to further evaluate.  CTA chest pending at time of admission  Pt w/o sepsis. 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 Will admit.   CONSULT with Dr. Rhona Leavens who accepts patient for admission.   About 5 minutes after admission I was informed by nursing staff that patient would like to leave AMA. I discussed risks and benefits of treatment and pt understands risk of leaving AMA including permanent disability or death. She voices understanding and would still like to leave. She states she will come back either later tonight or tomorrow morning.   ED Discharge Orders    None       Rayne Du 10/22/19 2044    Pricilla Loveless, MD 10/23/19 1536

## 2019-10-22 NOTE — Discharge Instructions (Addendum)
Today based on your work-up we are concerned he may have an infection in your heart that has led to blood clots in your lungs.  This is a very serious infection that is potentially life-threatening.  It was recommended that you be admitted to the hospital.  You chose to leave [place].  If you change your mind and decide to be admitted to the hospital we would be very happy to care for you again.  Please return to the emergency department immediately for any new or worsening symptoms or if you choose to undergo treatment.

## 2019-10-22 NOTE — ED Notes (Signed)
Pt states she needs to go home to take care of some things and then come back.  Pt was told she would have to sign out AMA and she could not just leave and come back.  Pt signed [place] paperwork and stated she would come back in a few hours or tomorrow

## 2019-10-25 ENCOUNTER — Inpatient Hospital Stay (HOSPITAL_COMMUNITY)
Admission: EM | Admit: 2019-10-25 | Discharge: 2019-10-31 | DRG: 871 | Discharge status: Against Medical Advice | Payer: Medicaid Other | Attending: Internal Medicine | Admitting: Internal Medicine

## 2019-10-25 ENCOUNTER — Encounter (HOSPITAL_COMMUNITY): Payer: Self-pay | Admitting: Emergency Medicine

## 2019-10-25 DIAGNOSIS — R64 Cachexia: Secondary | ICD-10-CM | POA: Diagnosis present

## 2019-10-25 DIAGNOSIS — Z833 Family history of diabetes mellitus: Secondary | ICD-10-CM

## 2019-10-25 DIAGNOSIS — I76 Septic arterial embolism: Secondary | ICD-10-CM | POA: Diagnosis present

## 2019-10-25 DIAGNOSIS — F1123 Opioid dependence with withdrawal: Secondary | ICD-10-CM | POA: Diagnosis not present

## 2019-10-25 DIAGNOSIS — I2690 Septic pulmonary embolism without acute cor pulmonale: Secondary | ICD-10-CM | POA: Diagnosis present

## 2019-10-25 DIAGNOSIS — F1590 Other stimulant use, unspecified, uncomplicated: Secondary | ICD-10-CM | POA: Diagnosis present

## 2019-10-25 DIAGNOSIS — B182 Chronic viral hepatitis C: Secondary | ICD-10-CM | POA: Diagnosis present

## 2019-10-25 DIAGNOSIS — R7881 Bacteremia: Secondary | ICD-10-CM

## 2019-10-25 DIAGNOSIS — Z5329 Procedure and treatment not carried out because of patient's decision for other reasons: Secondary | ICD-10-CM | POA: Diagnosis not present

## 2019-10-25 DIAGNOSIS — F1911 Other psychoactive substance abuse, in remission: Secondary | ICD-10-CM | POA: Diagnosis present

## 2019-10-25 DIAGNOSIS — A419 Sepsis, unspecified organism: Secondary | ICD-10-CM

## 2019-10-25 DIAGNOSIS — A4102 Sepsis due to Methicillin resistant Staphylococcus aureus: Principal | ICD-10-CM | POA: Diagnosis present

## 2019-10-25 DIAGNOSIS — Z20828 Contact with and (suspected) exposure to other viral communicable diseases: Secondary | ICD-10-CM | POA: Diagnosis present

## 2019-10-25 DIAGNOSIS — I330 Acute and subacute infective endocarditis: Secondary | ICD-10-CM | POA: Diagnosis present

## 2019-10-25 DIAGNOSIS — F17210 Nicotine dependence, cigarettes, uncomplicated: Secondary | ICD-10-CM | POA: Diagnosis present

## 2019-10-25 DIAGNOSIS — J188 Other pneumonia, unspecified organism: Secondary | ICD-10-CM | POA: Diagnosis present

## 2019-10-25 DIAGNOSIS — Z8249 Family history of ischemic heart disease and other diseases of the circulatory system: Secondary | ICD-10-CM

## 2019-10-25 DIAGNOSIS — F329 Major depressive disorder, single episode, unspecified: Secondary | ICD-10-CM | POA: Diagnosis present

## 2019-10-25 DIAGNOSIS — R8271 Bacteriuria: Secondary | ICD-10-CM | POA: Diagnosis present

## 2019-10-25 DIAGNOSIS — I079 Rheumatic tricuspid valve disease, unspecified: Secondary | ICD-10-CM

## 2019-10-25 DIAGNOSIS — D500 Iron deficiency anemia secondary to blood loss (chronic): Secondary | ICD-10-CM | POA: Diagnosis present

## 2019-10-25 DIAGNOSIS — Z818 Family history of other mental and behavioral disorders: Secondary | ICD-10-CM

## 2019-10-25 DIAGNOSIS — F1990 Other psychoactive substance use, unspecified, uncomplicated: Secondary | ICD-10-CM | POA: Diagnosis present

## 2019-10-25 DIAGNOSIS — B954 Other streptococcus as the cause of diseases classified elsewhere: Secondary | ICD-10-CM | POA: Diagnosis present

## 2019-10-25 DIAGNOSIS — Z681 Body mass index (BMI) 19 or less, adult: Secondary | ICD-10-CM

## 2019-10-25 DIAGNOSIS — F1920 Other psychoactive substance dependence, uncomplicated: Secondary | ICD-10-CM | POA: Diagnosis present

## 2019-10-25 DIAGNOSIS — I959 Hypotension, unspecified: Secondary | ICD-10-CM | POA: Diagnosis present

## 2019-10-25 DIAGNOSIS — J9601 Acute respiratory failure with hypoxia: Secondary | ICD-10-CM | POA: Diagnosis present

## 2019-10-25 DIAGNOSIS — J189 Pneumonia, unspecified organism: Secondary | ICD-10-CM

## 2019-10-25 DIAGNOSIS — L959 Vasculitis limited to the skin, unspecified: Secondary | ICD-10-CM | POA: Diagnosis present

## 2019-10-25 LAB — COMPREHENSIVE METABOLIC PANEL
ALT: 15 U/L (ref 0–44)
AST: 20 U/L (ref 15–41)
Albumin: 2 g/dL — ABNORMAL LOW (ref 3.5–5.0)
Alkaline Phosphatase: 53 U/L (ref 38–126)
Anion gap: 11 (ref 5–15)
BUN: 5 mg/dL — ABNORMAL LOW (ref 6–20)
CO2: 27 mmol/L (ref 22–32)
Calcium: 8.2 mg/dL — ABNORMAL LOW (ref 8.9–10.3)
Chloride: 94 mmol/L — ABNORMAL LOW (ref 98–111)
Creatinine, Ser: 0.86 mg/dL (ref 0.44–1.00)
GFR calc Af Amer: >60 mL/min (ref >60)
GFR calc non Af Amer: >60 mL/min (ref >60)
Glucose, Bld: 101 mg/dL — ABNORMAL HIGH (ref 70–99)
Potassium: 4 mmol/L (ref 3.5–5.1)
Sodium: 132 mmol/L — ABNORMAL LOW (ref 135–145)
Total Bilirubin: 0.6 mg/dL (ref 0.3–1.2)
Total Protein: 7.2 g/dL (ref 6.5–8.1)

## 2019-10-25 LAB — CBC WITH DIFFERENTIAL/PLATELET
Abs Immature Granulocytes: 0.03 10*3/uL (ref 0.00–0.07)
Basophils Absolute: 0 10*3/uL (ref 0.0–0.1)
Basophils Relative: 0 %
Eosinophils Absolute: 0 10*3/uL (ref 0.0–0.5)
Eosinophils Relative: 1 %
HCT: 25.6 % — ABNORMAL LOW (ref 36.0–46.0)
Hemoglobin: 7.7 g/dL — ABNORMAL LOW (ref 12.0–15.0)
Immature Granulocytes: 0 %
Lymphocytes Relative: 20 %
Lymphs Abs: 1.4 10*3/uL (ref 0.7–4.0)
MCH: 26.4 pg (ref 26.0–34.0)
MCHC: 30.1 g/dL (ref 30.0–36.0)
MCV: 87.7 fL (ref 80.0–100.0)
Monocytes Absolute: 0.3 10*3/uL (ref 0.1–1.0)
Monocytes Relative: 5 %
Neutro Abs: 5.2 10*3/uL (ref 1.7–7.7)
Neutrophils Relative %: 74 %
Platelets: 111 10*3/uL — ABNORMAL LOW (ref 150–400)
RBC: 2.92 MIL/uL — ABNORMAL LOW (ref 3.87–5.11)
RDW: 15.5 % (ref 11.5–15.5)
WBC: 7 10*3/uL (ref 4.0–10.5)
nRBC: 0 % (ref 0.0–0.2)

## 2019-10-25 LAB — URINE CULTURE: Culture: >=100000 — AB

## 2019-10-25 LAB — LACTIC ACID, PLASMA: Lactic Acid, Venous: 0.8 mmol/L (ref 0.5–1.9)

## 2019-10-25 MED ORDER — ACETAMINOPHEN 325 MG PO TABS
650.0000 mg | ORAL_TABLET | Freq: Once | ORAL | Status: AC
  Administered 2019-10-25: 650 mg via ORAL
  Filled 2019-10-25: qty 2

## 2019-10-25 NOTE — ED Triage Notes (Signed)
Pt returns to be admitted, she states "I had to leave to take care of some business, I was called to come back because I have an infection in my heart valve.  Pt appears to be high at triage, she is cooperative and calm. She uses heroin and methamphetamine, it has been 12 hrs since last use. Tylenol will be given at triage, the wait time was expressed. EDP notified.   Report from imaging:  Findings are concerning for septic embolism in the reported setting of IVDU. Recommend CT to further evaluate.

## 2019-10-25 NOTE — ED Notes (Signed)
Mom wants to be keep up to date Aimee Vella [place] [phone]

## 2019-10-26 ENCOUNTER — Emergency Department (HOSPITAL_COMMUNITY): Payer: Medicaid Other

## 2019-10-26 ENCOUNTER — Other Ambulatory Visit (HOSPITAL_COMMUNITY): Payer: Medicaid Other

## 2019-10-26 ENCOUNTER — Telehealth (HOSPITAL_BASED_OUTPATIENT_CLINIC_OR_DEPARTMENT_OTHER): Payer: Self-pay

## 2019-10-26 DIAGNOSIS — Z8249 Family history of ischemic heart disease and other diseases of the circulatory system: Secondary | ICD-10-CM | POA: Diagnosis not present

## 2019-10-26 DIAGNOSIS — A419 Sepsis, unspecified organism: Secondary | ICD-10-CM | POA: Diagnosis present

## 2019-10-26 DIAGNOSIS — F1920 Other psychoactive substance dependence, uncomplicated: Secondary | ICD-10-CM

## 2019-10-26 DIAGNOSIS — I76 Septic arterial embolism: Secondary | ICD-10-CM | POA: Diagnosis present

## 2019-10-26 DIAGNOSIS — F1990 Other psychoactive substance use, unspecified, uncomplicated: Secondary | ICD-10-CM | POA: Diagnosis not present

## 2019-10-26 DIAGNOSIS — B954 Other streptococcus as the cause of diseases classified elsewhere: Secondary | ICD-10-CM | POA: Diagnosis present

## 2019-10-26 DIAGNOSIS — J189 Pneumonia, unspecified organism: Secondary | ICD-10-CM

## 2019-10-26 DIAGNOSIS — F1123 Opioid dependence with withdrawal: Secondary | ICD-10-CM | POA: Diagnosis not present

## 2019-10-26 DIAGNOSIS — I330 Acute and subacute infective endocarditis: Secondary | ICD-10-CM | POA: Diagnosis present

## 2019-10-26 DIAGNOSIS — Z681 Body mass index (BMI) 19 or less, adult: Secondary | ICD-10-CM | POA: Diagnosis not present

## 2019-10-26 DIAGNOSIS — B9562 Methicillin resistant Staphylococcus aureus infection as the cause of diseases classified elsewhere: Secondary | ICD-10-CM | POA: Diagnosis not present

## 2019-10-26 DIAGNOSIS — R8271 Bacteriuria: Secondary | ICD-10-CM | POA: Diagnosis present

## 2019-10-26 DIAGNOSIS — R7881 Bacteremia: Secondary | ICD-10-CM | POA: Diagnosis not present

## 2019-10-26 DIAGNOSIS — B182 Chronic viral hepatitis C: Secondary | ICD-10-CM | POA: Diagnosis present

## 2019-10-26 DIAGNOSIS — J9601 Acute respiratory failure with hypoxia: Secondary | ICD-10-CM | POA: Diagnosis present

## 2019-10-26 DIAGNOSIS — I340 Nonrheumatic mitral (valve) insufficiency: Secondary | ICD-10-CM | POA: Diagnosis not present

## 2019-10-26 DIAGNOSIS — Z833 Family history of diabetes mellitus: Secondary | ICD-10-CM | POA: Diagnosis not present

## 2019-10-26 DIAGNOSIS — I361 Nonrheumatic tricuspid (valve) insufficiency: Secondary | ICD-10-CM | POA: Diagnosis not present

## 2019-10-26 DIAGNOSIS — F1590 Other stimulant use, unspecified, uncomplicated: Secondary | ICD-10-CM | POA: Diagnosis present

## 2019-10-26 DIAGNOSIS — I959 Hypotension, unspecified: Secondary | ICD-10-CM | POA: Diagnosis present

## 2019-10-26 DIAGNOSIS — I2690 Septic pulmonary embolism without acute cor pulmonale: Secondary | ICD-10-CM | POA: Diagnosis present

## 2019-10-26 DIAGNOSIS — Z818 Family history of other mental and behavioral disorders: Secondary | ICD-10-CM | POA: Diagnosis not present

## 2019-10-26 DIAGNOSIS — F17210 Nicotine dependence, cigarettes, uncomplicated: Secondary | ICD-10-CM | POA: Diagnosis present

## 2019-10-26 DIAGNOSIS — L959 Vasculitis limited to the skin, unspecified: Secondary | ICD-10-CM | POA: Diagnosis present

## 2019-10-26 DIAGNOSIS — Z5329 Procedure and treatment not carried out because of patient's decision for other reasons: Secondary | ICD-10-CM | POA: Diagnosis not present

## 2019-10-26 DIAGNOSIS — Z20828 Contact with and (suspected) exposure to other viral communicable diseases: Secondary | ICD-10-CM | POA: Diagnosis present

## 2019-10-26 DIAGNOSIS — D500 Iron deficiency anemia secondary to blood loss (chronic): Secondary | ICD-10-CM | POA: Diagnosis present

## 2019-10-26 DIAGNOSIS — R918 Other nonspecific abnormal finding of lung field: Secondary | ICD-10-CM | POA: Diagnosis not present

## 2019-10-26 DIAGNOSIS — F329 Major depressive disorder, single episode, unspecified: Secondary | ICD-10-CM | POA: Diagnosis present

## 2019-10-26 DIAGNOSIS — R64 Cachexia: Secondary | ICD-10-CM | POA: Diagnosis present

## 2019-10-26 DIAGNOSIS — J984 Other disorders of lung: Secondary | ICD-10-CM

## 2019-10-26 DIAGNOSIS — F1911 Other psychoactive substance abuse, in remission: Secondary | ICD-10-CM

## 2019-10-26 DIAGNOSIS — A4102 Sepsis due to Methicillin resistant Staphylococcus aureus: Secondary | ICD-10-CM | POA: Diagnosis present

## 2019-10-26 DIAGNOSIS — J188 Other pneumonia, unspecified organism: Secondary | ICD-10-CM | POA: Diagnosis present

## 2019-10-26 DIAGNOSIS — F1120 Opioid dependence, uncomplicated: Secondary | ICD-10-CM | POA: Diagnosis not present

## 2019-10-26 LAB — URINALYSIS, ROUTINE W REFLEX MICROSCOPIC
Bilirubin Urine: NEGATIVE
Glucose, UA: NEGATIVE mg/dL
Ketones, ur: NEGATIVE mg/dL
Nitrite: NEGATIVE
Protein, ur: NEGATIVE mg/dL
Specific Gravity, Urine: 1.031 — ABNORMAL HIGH (ref 1.005–1.030)
WBC, UA: >50 WBC/hpf — ABNORMAL HIGH (ref 0–5)
pH: 6 (ref 5.0–8.0)

## 2019-10-26 LAB — RAPID URINE DRUG SCREEN, HOSP PERFORMED
Amphetamines: NOT DETECTED
Barbiturates: NOT DETECTED
Benzodiazepines: NOT DETECTED
Cocaine: NOT DETECTED
Opiates: POSITIVE — AB
Tetrahydrocannabinol: NOT DETECTED

## 2019-10-26 LAB — SARS CORONAVIRUS 2 (TAT 6-24 HRS): SARS Coronavirus 2: NEGATIVE

## 2019-10-26 LAB — HIV ANTIBODY (ROUTINE TESTING W REFLEX): HIV Screen 4th Generation wRfx: NONREACTIVE

## 2019-10-26 LAB — PREGNANCY, URINE: Preg Test, Ur: NEGATIVE

## 2019-10-26 LAB — PROCALCITONIN: Procalcitonin: <0.1 ng/mL

## 2019-10-26 LAB — HEPATITIS B SURFACE ANTIGEN: Hepatitis B Surface Ag: NONREACTIVE

## 2019-10-26 MED ORDER — SODIUM CHLORIDE 0.9 % IV SOLN: 2.0000 g | Freq: Three times a day (TID) | INTRAVENOUS | Status: DC

## 2019-10-26 MED ORDER — ACETAMINOPHEN 325 MG PO TABS
650.0000 mg | ORAL_TABLET | Freq: Four times a day (QID) | ORAL | Status: DC | PRN
  Administered 2019-10-26: 650 mg via ORAL
  Filled 2019-10-26: qty 2

## 2019-10-26 MED ORDER — SODIUM CHLORIDE 0.9 % IV BOLUS
500.0000 mL | Freq: Once | INTRAVENOUS | Status: AC
  Administered 2019-10-26: 500 mL via INTRAVENOUS

## 2019-10-26 MED ORDER — DOCUSATE SODIUM 100 MG PO CAPS
100.0000 mg | ORAL_CAPSULE | Freq: Two times a day (BID) | ORAL | Status: DC
  Administered 2019-10-26 – 2019-10-31 (×7): 100 mg via ORAL
  Filled 2019-10-26 (×10): qty 1

## 2019-10-26 MED ORDER — METHOCARBAMOL 500 MG PO TABS
500.0000 mg | ORAL_TABLET | Freq: Three times a day (TID) | ORAL | Status: AC | PRN
  Administered 2019-10-27: 500 mg via ORAL
  Filled 2019-10-26: qty 1

## 2019-10-26 MED ORDER — SODIUM CHLORIDE 0.9 % IV SOLN
2.0000 g | Freq: Once | INTRAVENOUS | Status: AC
  Administered 2019-10-26: 2 g via INTRAVENOUS
  Filled 2019-10-26: qty 2

## 2019-10-26 MED ORDER — NICOTINE 14 MG/24HR TD PT24
14.0000 mg | MEDICATED_PATCH | Freq: Every day | TRANSDERMAL | Status: DC
  Administered 2019-10-26 – 2019-10-31 (×6): 14 mg via TRANSDERMAL
  Filled 2019-10-26 (×6): qty 1

## 2019-10-26 MED ORDER — POLYETHYLENE GLYCOL 3350 17 G PO PACK: 17.0000 g | PACK | Freq: Every day | ORAL | Status: DC | PRN

## 2019-10-26 MED ORDER — HYDROXYZINE HCL 25 MG PO TABS
25.0000 mg | ORAL_TABLET | Freq: Four times a day (QID) | ORAL | Status: AC | PRN
  Administered 2019-10-26: 25 mg via ORAL
  Filled 2019-10-26 (×3): qty 1

## 2019-10-26 MED ORDER — SODIUM CHLORIDE 0.9 % IV BOLUS
1000.0000 mL | Freq: Once | INTRAVENOUS | Status: AC
  Administered 2019-10-26: 1000 mL via INTRAVENOUS

## 2019-10-26 MED ORDER — DICYCLOMINE HCL 20 MG PO TABS: 20.0000 mg | ORAL_TABLET | Freq: Four times a day (QID) | ORAL | Status: AC | PRN

## 2019-10-26 MED ORDER — LOPERAMIDE HCL 2 MG PO CAPS: 2.0000 mg | ORAL_CAPSULE | ORAL | Status: AC | PRN

## 2019-10-26 MED ORDER — ACETAMINOPHEN 650 MG RE SUPP: 650.0000 mg | Freq: Four times a day (QID) | RECTAL | Status: DC | PRN

## 2019-10-26 MED ORDER — BUPRENORPHINE HCL-NALOXONE HCL 8-2 MG SL SUBL
1.0000 | SUBLINGUAL_TABLET | Freq: Two times a day (BID) | SUBLINGUAL | Status: DC
  Administered 2019-10-27 (×2): 1 via SUBLINGUAL
  Filled 2019-10-26 (×2): qty 1

## 2019-10-26 MED ORDER — ACETAMINOPHEN 325 MG PO TABS
650.0000 mg | ORAL_TABLET | Freq: Once | ORAL | Status: AC
  Administered 2019-10-26: 650 mg via ORAL
  Filled 2019-10-26: qty 2

## 2019-10-26 MED ORDER — SODIUM CHLORIDE 0.9% FLUSH
3.0000 mL | Freq: Two times a day (BID) | INTRAVENOUS | Status: DC
  Administered 2019-10-28 – 2019-10-31 (×5): 3 mL via INTRAVENOUS

## 2019-10-26 MED ORDER — ENOXAPARIN SODIUM 40 MG/0.4ML ~~LOC~~ SOLN
40.0000 mg | SUBCUTANEOUS | Status: DC
  Filled 2019-10-26 (×4): qty 0.4

## 2019-10-26 MED ORDER — NAPROXEN 250 MG PO TABS
500.0000 mg | ORAL_TABLET | Freq: Two times a day (BID) | ORAL | Status: AC | PRN
  Administered 2019-10-27 – 2019-10-30 (×2): 500 mg via ORAL
  Filled 2019-10-26 (×3): qty 2

## 2019-10-26 MED ORDER — ONDANSETRON HCL 4 MG PO TABS: 4.0000 mg | ORAL_TABLET | Freq: Four times a day (QID) | ORAL | Status: DC | PRN

## 2019-10-26 MED ORDER — VANCOMYCIN HCL IN DEXTROSE 1-5 GM/200ML-% IV SOLN
1000.0000 mg | Freq: Once | INTRAVENOUS | Status: AC
  Administered 2019-10-26: 1000 mg via INTRAVENOUS
  Filled 2019-10-26: qty 200

## 2019-10-26 MED ORDER — ONDANSETRON HCL 4 MG/2ML IJ SOLN
4.0000 mg | Freq: Four times a day (QID) | INTRAMUSCULAR | Status: DC | PRN
  Administered 2019-10-26: 4 mg via INTRAVENOUS
  Filled 2019-10-26: qty 2

## 2019-10-26 MED ORDER — LACTATED RINGERS IV SOLN
INTRAVENOUS | Status: DC
  Administered 2019-10-26 (×2): via INTRAVENOUS

## 2019-10-26 MED ORDER — IOHEXOL 350 MG/ML SOLN
75.0000 mL | Freq: Once | INTRAVENOUS | Status: AC | PRN
  Administered 2019-10-26: 08:00:00 75 mL via INTRAVENOUS

## 2019-10-26 MED ORDER — VANCOMYCIN HCL 500 MG IV SOLR
500.0000 mg | Freq: Two times a day (BID) | INTRAVENOUS | Status: DC
  Administered 2019-10-27 – 2019-10-31 (×9): 500 mg via INTRAVENOUS
  Filled 2019-10-26 (×13): qty 500

## 2019-10-26 MED ORDER — BUPRENORPHINE HCL-NALOXONE HCL 2-0.5 MG SL SUBL
2.0000 | SUBLINGUAL_TABLET | SUBLINGUAL | Status: AC | PRN
  Administered 2019-10-26 (×2): 2 via SUBLINGUAL
  Filled 2019-10-26 (×3): qty 2

## 2019-10-26 MED ORDER — SODIUM CHLORIDE 0.9 % IV SOLN
2.0000 g | INTRAVENOUS | Status: DC
  Administered 2019-10-26: 2 g via INTRAVENOUS
  Filled 2019-10-26 (×2): qty 20

## 2019-10-26 NOTE — Consult Note (Signed)
[facility]    Date of Admission:  10/25/2019     Total days of antibiotics 1  Vanc + Cefepime                  Reason for Consult: Rash, cavitary pneumonia, IVDU    Referring Provider: Lorin Mercy  Primary Care Provider: Denita Lung, MD    Assessment: [name] is a 28 y.o. female with active injection drug use history here with bilateral cavitary pneumonia and purpuritic-petechial rash on the lower extremities that is new.   Would anticipate staph aureus and start vancomycin; also start ceftriaxone for now. Follow blood cultures. If she can produce sputum please collect sample for testing.   Rash seems to be improving prior to current admission. Appears c/w leukocytoclastic vasculitis that may be related to current pulmonary infection (have seen this in the setting of staphylococcal bacteremia) or alternative may be an extrahepatic symptom of hepatitis c; less likely the latter given concurrent pulmonary findings. No signs of organ dysfunction and seems limited to skin. If related to infection will continue to improve; otherwise would consider dermal biopsy.   Will arrange for TEE to assess for endocarditis given context. She is agreeable to this and accepts that she may need to stay in the hospital for her safety under supervision to do so. She has had opioid overdoses in the past.   Opioid addiction, severe with injection use daily = +Hep C RNA in 2017 but nothing since. Will repeat RNA now to determine if she needs antivirals to clear (if she is interested). Check Hep B sAg also. HIV non reactive.    Plan: 1. Start vancomycin  2. Change cefepime to ceftriaxone 3. Follow blood cultures  4. Cardiology consult for TEE (done - will be next week d/t scheduling) 5. Hep C RNA and Hep B sAg    Principal Problem:   Sepsis ([place]) Active Problems:   Cigarette smoker   Polysubstance (including opioids) dependence, daily use (HCC)   Septic  embolism (HCC)   Cavitary pneumonia   Vasculitis of skin   . [START ON 10/27/2019] buprenorphine-naloxone  1 tablet Sublingual BID  . docusate sodium  100 mg Oral BID  . enoxaparin (LOVENOX) injection  40 mg Subcutaneous Q24H  . nicotine  14 mg Transdermal Daily  . sodium chloride flush  3 mL Intravenous Q12H    HPI: [name] is a 28 y.o. female with pmhx including polysubstance use with injection drug use daily x 1 year.   She presented to the ER on 10/25 for evaluation of a rash that started a few days before as a few small red dots on the ankles that quickly evolved into a purpuric petechial violaceous rash up the thighs. She had painful swelling of the feet/legs at that time that has since subsided. The week before the rash presented she had high fevers to 103.5 F with shaking chills. Shad had some upper back pain and slight shortness of breath at this time with cough that she attributed to a cold. Fevers have since gone away. Prior to the onset of the rash she did take a few of her mother's clindamycin (twice a day) then stopped shortly after thinking the rash may be due to that.   She does inject drugs daily and has done so for just over a year but has been using other drugs longer than that. She has had skin  abscesses prior to injection drug use. Has never had any severe infections related to her drug use aside from hepatitis c, which is untreated. She is uncertain how long she has had hep c and is not interested in treating it at this time because it takes too long and hears it makes you sick. HIV screen recently negative.    Review of Systems: Review of Systems  Constitutional: Positive for fever (>1week ago). Negative for diaphoresis and malaise/fatigue.  HENT: Negative for sore throat.   Respiratory: Positive for cough and sputum production. Shortness of breath: occasionally    Cardiovascular: Chest pain: occasionally.  Skin: Positive for rash. Negative for itching.     Past Medical History:  Diagnosis Date  . History of RPR test 07/21/2016   02/2016: 1:1 with negative TPA testing [ ]  f/u 28wks [ ]  f/u admit RPR [ ]  f/u PP RPR  . History of self-harm   . Opioid abuse (HCC)   . Polysubstance abuse (HCC)     Social History   Tobacco Use  . Smoking status: Current Every Day Smoker    Packs/day: 0.50    Types: Cigarettes  . Smokeless tobacco: Never Used  Substance Use Topics  . Alcohol use: No  . Drug use: Yes    Frequency: 5.0 times per week    Types: Cocaine, Marijuana, Methamphetamines    Comment: Heroin    Family History  Problem Relation Age of Onset  . Vision loss Mother   . Depression Mother   . Ulcers Mother   . Gallbladder disease Mother   . Mental illness Mother   . Thyroid disease Mother   . Allergies Father   . Vision loss Father   . Vision loss Maternal Grandmother   . Ulcers Maternal Grandmother   . Gallbladder disease Maternal Grandmother   . Diabetes Maternal Grandmother   . Vision loss Maternal Grandfather   . Heart disease Maternal Grandfather   . Cancer Maternal Grandfather   . Allergies Paternal Grandmother   . Vision loss Paternal Grandmother   . Gallbladder disease Paternal Grandmother   . Arthritis Paternal Grandmother   . Cancer Paternal Grandmother   . Allergies Paternal Grandfather   . Vision loss Paternal Grandfather    Allergies  Allergen Reactions  . Dextromethorphan Swelling    OBJECTIVE: Blood pressure 111/75, pulse (!) 116, temperature 98.8 F (37.1 C), resp. rate 18, height 5' (1.524 m), weight 45.4 kg, last menstrual period 09/27/2019, SpO2 98 %.  Physical Exam  Lab Results Lab Results  Component Value Date   WBC 7.0 10/25/2019   HGB 7.7 (L) 10/25/2019   HCT 25.6 (L) 10/25/2019   MCV 87.7 10/25/2019   PLT 111 (L) 10/25/2019    Lab Results  Component Value Date   CREATININE 0.86 10/25/2019   BUN 5 (L) 10/25/2019   NA 132 (L) 10/25/2019   K 4.0 10/25/2019   CL 94 (L) 10/25/2019    CO2 27 10/25/2019    Lab Results  Component Value Date   ALT 15 10/25/2019   AST 20 10/25/2019   ALKPHOS 53 10/25/2019   BILITOT 0.6 10/25/2019     Microbiology: Recent Results (from the past 240 hour(s))  Blood Culture (routine x 2)     Status: None (Preliminary result)   Collection Time: 10/22/19  7:39 PM   Specimen: BLOOD  Result Value Ref Range Status   Specimen Description BLOOD RIGHT ANTECUBITAL  Final   Special Requests   Final  BOTTLES DRAWN AEROBIC ONLY Blood Culture results may not be optimal due to an inadequate volume of blood received in culture bottles   Culture   Final    NO GROWTH 4 DAYS Performed at [facility], [address] [place], [place] [address]    Report Status PENDING  Incomplete  Urine culture     Status: Abnormal   Collection Time: 10/22/19  9:10 PM   Specimen: In/Out Cath Urine  Result Value Ref Range Status   Specimen Description IN/OUT CATH URINE  Final   Special Requests   Final    NONE Performed at [facility], [address] [place], [place] [address]    Culture >=100,000 COLONIES/mL VIRIDANS STREPTOCOCCUS (A)  Final   Report Status 10/25/2019 FINAL  Final  SARS CORONAVIRUS 2 (TAT 6-24 HRS) Nasopharyngeal Nasopharyngeal Swab     Status: None   Collection Time: 10/26/19  6:55 AM   Specimen: Nasopharyngeal Swab  Result Value Ref Range Status   SARS Coronavirus 2 NEGATIVE NEGATIVE Final    Comment: (NOTE) SARS-CoV-2 target nucleic acids are NOT DETECTED. The SARS-CoV-2 RNA is generally detectable in upper and lower respiratory specimens during the acute phase of infection. Negative results do not preclude SARS-CoV-2 infection, do not rule out co-infections with other pathogens, and should not be used as the sole basis for treatment or other patient management decisions. Negative results must be combined with clinical observations, patient history, and epidemiological information. The expected result is Negative. Fact  Sheet for Patients: HairSlick.nohttps://www.fda.gov/media/138098/download Fact Sheet for Healthcare Providers: quierodirigir.comhttps://www.fda.gov/media/138095/download This test is not yet approved or cleared by the [place] FDA and  has been authorized for detection and/or diagnosis of SARS-CoV-2 by FDA under an Emergency Use Authorization (EUA). This EUA will remain  in effect (meaning this test can be used) for the duration of the COVID-19 declaration under Section 56 4(b)(1) of the Act, 21 U.S.C. section 360bbb-3(b)(1), unless the authorization is terminated or revoked sooner. Performed at [facility], [address] [place], [place] [address]      Rexene AlbertsStephanie Dixon, MSN, NP-C [facility]  [email or web address] Pager: [phone] Office: [phone] [facility] Main Line: [phone]   10/26/2019 5:00 PM

## 2019-10-26 NOTE — ED Notes (Signed)
Lunch Tray Ordered @ 1053.  

## 2019-10-26 NOTE — Telephone Encounter (Signed)
Post ED Visit - Positive Culture Follow-up  Culture report reviewed by antimicrobial stewardship pharmacist: [place] Team []  Elenor Quinones, Pharm.D. []  Heide Guile, Pharm.D., BCPS AQ-ID []  Parks Neptune, Pharm.D., BCPS []  Alycia Rossetti, Pharm.D., BCPS []  [place], [place].D., BCPS, AAHIVP []  Legrand Como, Pharm.D., BCPS, AAHIVP []  Salome Arnt, PharmD, BCPS []  Johnnette Gourd, PharmD, BCPS []  Hughes Better, PharmD, BCPS []  Leeroy Cha, PharmD []  Laqueta Linden, PharmD, BCPS []  Albertina Parr, PharmD Velia Meyer B. Francesca Jewett, PharmD  [place] Team []  Leodis Sias, PharmD []  Lindell Spar, PharmD []  Royetta Asal, PharmD []  Graylin Shiver, Rph []  Rema Fendt) Glennon Mac, PharmD []  Arlyn Dunning, PharmD []  Netta Cedars, PharmD []  Dia Sitter, PharmD []  Leone Haven, PharmD []  Gretta Arab, PharmD []  Theodis Shove, PharmD []  Peggyann Juba, PharmD []  Reuel Boom, PharmD   Positive urine culture >/= 100,000 colonies Viridans Streptococcus Left [facility] 10/25 [facility] admitting today. Positive Culture noted by admitting team - Dr Lorin Mercy progress note 10/29 7:27AM.  On appropriate empiric antibodies. OK   Dortha Kern 10/26/2019, 10:49 AM

## 2019-10-26 NOTE — Progress Notes (Signed)
Visited patient to complete Spiritual Consult. Patient is motivated by her children and the desire to live. Stated that she is willing  to do what it takes to get well. No specific spiritual needs at the time. Will continue to provide spiritual care as needed.  Rev. [place].

## 2019-10-26 NOTE — ED Notes (Signed)
Walked patient to the bathroom patient did well 

## 2019-10-26 NOTE — H&P (Addendum)
History and Physical    [name] ZOX:[id number] DOB: 1991-01-16 DOA: 10/25/2019  PCP: Ronnald Nian, MD Consultants:  None Patient coming from:  Home - lives with ; [facility]: Mother, Marijo Conception, [phone]  Chief Complaint: fever, rash  HPI: [name] is a 28 y.o. female with medical history significant of polysubstance abuse with h/o self harm presenting with fever.   She was seen in the ER on 10/25 with c/o rash to her legs and fever to 102.  CXR with cavitary lung lesions, concerning for septic emboli.  Also concern for endocardition.  She left shortly thereafter AMA.  She returned last night (10/28) for admission.  She reports LE rash and fever, but "not for the last 2 days."  She is somnolent and somewhat hostile, reluctant to answer questions.  She asks that I not notify her mother of anything regarding her care.  She acknowledges IVDA and reports last use was yesterday AM.  She reports that she has been through detox and has been on Subutex in the past; she requests Subutex detox again but is not interesting in quitting or rehab at this time.   ED Course:  IV heroin and methamphetamine.  Fevers to 102, cough, purpuric rash on her legs. Cavitary lesions of lungs, CT pending.  Septic emboli vs. Endocarditis.  Recent UA with strep viridans, no urinary complaints.  Given Cefepime and Vanc.  COVID pending.  Review of Systems: As per HPI; otherwise review of systems reviewed and negative.  Limited by her reluctance to answer questions.  Ambulatory Status:  Ambulates without assistance  Past Medical History:  Diagnosis Date   History of RPR test 07/21/2016   02/2016: 1:1 with negative TPA testing [ ]  f/u 28wks [ ]  f/u admit RPR [ ]  f/u PP RPR   History of self-harm    Opioid abuse (HCC)    Polysubstance abuse (HCC)     Past Surgical History:  Procedure Laterality Date   TONSILLECTOMY      Social History   Socioeconomic History   Marital status: Single    Spouse  name: Not on file   Number of children: Not on file   Years of education: Not on file   Highest education level: Not on file  Occupational History   Not on file  Social Needs   Financial resource strain: Not on file   Food insecurity    Worry: Not on file    Inability: Not on file   Transportation needs    Medical: Not on file    Non-medical: Not on file  Tobacco Use   Smoking status: Current Every Day Smoker    Packs/day: 0.50    Types: Cigarettes   Smokeless tobacco: Never Used  Substance and Sexual Activity   Alcohol use: No   Drug use: Yes    Frequency: 5.0 times per week    Types: Cocaine, Marijuana, Methamphetamines    Comment: Heroin   Sexual activity: Yes    Partners: Male    Birth control/protection: None  Lifestyle   Physical activity    Days per week: Not on file    Minutes per session: Not on file   Stress: Not on file  Relationships   Social connections    Talks on phone: Not on file    Gets together: Not on file    Attends religious service: Not on file    Active member of club or organization: Not on file  Attends meetings of clubs or organizations: Not on file    Relationship status: Not on file   Intimate partner violence    Fear of current or ex partner: Not on file    Emotionally abused: Not on file    Physically abused: Not on file    Forced sexual activity: Not on file  Other Topics Concern   Not on file  Social History Narrative   Not on file    Allergies  Allergen Reactions   Dextromethorphan Swelling    Family History  Problem Relation Age of Onset   Vision loss Mother    Depression Mother    Ulcers Mother    Gallbladder disease Mother    Mental illness Mother    Thyroid disease Mother    Allergies Father    Vision loss Father    Vision loss Maternal Grandmother    Ulcers Maternal Grandmother    Gallbladder disease Maternal Grandmother    Diabetes Maternal Grandmother    Vision loss  Maternal Grandfather    Heart disease Maternal Grandfather    Cancer Maternal Grandfather    Allergies Paternal Grandmother    Vision loss Paternal Grandmother    Gallbladder disease Paternal Grandmother    Arthritis Paternal Grandmother    Cancer Paternal Grandmother    Allergies Paternal Grandfather    Vision loss Paternal Grandfather     Prior to Admission medications   Medication Sig Start Date End Date Taking? Authorizing Provider  gabapentin (NEURONTIN) 400 MG capsule Take 400 mg by mouth 3 (three) times daily.  10/26/19  [provider]  mirtazapine (REMERON) 30 MG tablet Take 30 mg by mouth at bedtime.  10/26/19  [provider]  risperiDONE (RISPERDAL) 1 MG tablet Take 1 tablet (1 mg total) by mouth at bedtime. Patient not taking: Reported on 09/16/2018 07/13/18 10/26/19  Charm RingsLord, Jamison Y, NP  traZODone (DESYREL) 50 MG tablet Take 1 tablet (50 mg total) by mouth at bedtime as needed for sleep. Patient not taking: Reported on 09/16/2018 07/13/18 10/26/19  Charm RingsLord, Jamison Y, NP    Physical Exam: Vitals:   10/26/19 [id number] 10/26/19 0700 10/26/19 0815 10/26/19 0845  BP: 92/63 96/64 (!) 88/61 (!) 88/66  Pulse: 90 97  90  Resp: 18 19 20 20   Temp: (!) 97.4 F (36.3 C)     TempSrc: Oral     SpO2: 99% (!) 83%  100%  Weight: 45.4 kg     Height: 5' (1.524 m)         General:  Appears calm and comfortable and is NAD; somnolent and disinterested  Eyes:  PERRL, EOMI, normal lids, iris  ENT:  grossly normal hearing, lips & tongue, mmm; poor dentition  Neck:  no LAD, masses or thyromegaly  Cardiovascular:  RRR, no r/g, ?subtle murmur. No LE edema.   Respiratory:   CTA bilaterally with no wheezes/rales/rhonchi.  Normal respiratory effort.  Abdomen:  soft, NT, ND, NABS  Back:   normal alignment, no CVAT  Skin:  Purpuric rash along B LE, diffuse, appears less erythematous than on 10/25 ER evaluation       Musculoskeletal:  grossly normal tone  BUE/BLE, good ROM, no bony abnormality  Psychiatric:  Hostile mood and affect, speech sparse, AOx3  Neurologic:  CN 2-12 grossly intact, moves all extremities in coordinated fashion    Radiological Exams on Admission: Dg Chest 2 View  Result Date: 10/26/2019 CLINICAL DATA:  Evaluate cavitary lesions EXAM: CHEST - 2 VIEW COMPARISON:  10/22/2019 FINDINGS: Cardiac shadow is stable. Lungs are well aerated bilaterally. Cavitary lesions are again seen in both lungs worse on the left than the right. The overall appearance is relatively stable. No bony abnormality is seen. No effusion is noted. IMPRESSION: Cavitary lesions are again seen bilaterally and stable. Electronically Signed   By: Alcide Clever M.D.   On: 10/26/2019 00:45   Ct Chest W Contrast  Result Date: 10/26/2019 CLINICAL DATA:  Rash of bilateral lower extremities and face for 3 days. IV drug user. Cavitary lesion seen on chest x-ray. EXAM: CT CHEST WITH CONTRAST TECHNIQUE: Multidetector CT imaging of the chest was performed during intravenous contrast administration. CONTRAST:  75mL OMNIPAQUE IOHEXOL 350 MG/ML SOLN COMPARISON:  Chest x-ray October 26 2019 FINDINGS: Cardiovascular: No significant vascular findings. Normal heart size. No pericardial effusion. Mediastinum/Nodes: No significant mediastinal lymphadenopathy is identified. Small lymph nodes are identified in bilateral hilum. There is no axillary lymphadenopathy. Lungs/Pleura: Multiple cavitary lesions are identified throughout bilateral lungs, largest measures 2.9 x 2.8 cm. There is no pleural effusion or pulmonary edema. Upper Abdomen: The spleen is probably enlarged, incompletely included. Musculoskeletal: No chest wall abnormality. No acute or significant osseous findings. IMPRESSION: Multiple cavitary lesions are identified throughout bilateral lungs. Findings may be due to abscesses from septic emboli. Electronically Signed   By: Sherian Rein M.D.   On: 10/26/2019 08:03     EKG: Independently reviewed.  Sinus tachycardia with rate 133; no evidence of acute ischemia   Labs on Admission: I have personally reviewed the available labs and imaging studies at the time of the admission.  Pertinent labs:   Na++ 132 Albumin 2.0 Lactate 0.8 WBC 7.0 Hgb 7.7 Platelets 111 INR 1.0 COVID pending Urine culture from 10/25 with >100k colonies Strep viridans Blood cultures from 10/25 NTD   Assessment/Plan Principal Problem:   Sepsis (HCC) Active Problems:   Cigarette smoker   Polysubstance (including opioids) dependence, daily use (HCC)   Septic embolism (HCC)   Cavitary pneumonia   Sepsis with septic emboli and cavitary PNA in the setting of polysubstance abuse -SIRS criteria in this patient includes: Fever, tachycardia, hypoxia to 83%  -Patient has evidence of acute organ failure with borderline hypotension -While awaiting blood cultures, this appears to be a preseptic condition. -Sepsis protocol initiated -Patient had SBP <90/MAP <65 and so has received the 30 cc/kg IVF bolus. -Suspected source is septic emboli associated with IVDA -Blood and urine cultures pending -Will admit due to: hemodynamic instability; anticipated prolonged duration of IV antibiotic treatment; anticipated opiate withdrawal  -Treat with IV Cefepime/Vanc for undifferentiated sepsis -Will add HIV, RPR, Hep C -Will order sepsis protocol procalcitonin level.   -Likely needs echo to evaluate for SBE; could await blood cultures first. -ID consult -Will admit to progressive care unit for now given hemodynamic instability  Opiate dependence -Long-standing heroin dependence -She recently left AMA and this may be an issue during this hospitalization -Will initiate suboxone therapy now; while this may precipitate early withdrawal, hopefully she can reach a level of withdrawal symptom relief that will enable her to remain hospitalized and complete treatment -Suboxone order set was used  for severe opiate dependence -Will monitor on COWA protocol -prn orders from the Clonidine withdrawal order set were also ordered.  Tobacco dependence -Encourage cessation.   -Patch ordered at patient request.   Note: This patient has been tested and is pending for the novel coronavirus COVID-19.    DVT prophylaxis:  Lovenox Code Status:  Full  Family Communication: None present; patient refused to allow me to call her mother  Disposition Plan:  Home once clinically improved Consults called: ID  Admission status: Admit - It is my clinical opinion that admission to INPATIENT is reasonable and necessary because of the expectation that this patient will require hospital care that crosses at least 2 midnights to treat this condition based on the medical complexity of the problems presented.  Given the aforementioned information, the predictability of an adverse outcome is felt to be significant.    Karmen Bongo MD [facility]   How to contact the [facility] Attending or Consulting provider [place] or covering provider during after hours [place], for this patient?  1. Check the care team in [facility] and look for a) attending/consulting TR[facility] provider listed and b) the [facility] team listed 2. Log into [email or web address] and use [facility]'s universal password to access. If you do not have the password, please contact the hospital operator. 3. Locate the [facility] provider you are looking for under [facility] and page to a number that you can be directly reached. 4. If you still have difficulty reaching the provider, please page the [facility] (Director on Call) for the Hospitalists listed on amion for assistance.   10/26/2019, 9:41 AM

## 2019-10-26 NOTE — ED Provider Notes (Signed)
[facility] EMERGENCY DEPARTMENT Provider Note   CSN: [id number] Arrival date & time: 10/25/19  1636     History   Chief Complaint Chief Complaint  Patient presents with   Code Sepsis   Fever    HPI [name] is a 28 y.o. female with a history of IV heroin drug use, methamphetamine use, hepatitis C, and major depressive disorder who presents to the emergency department with a chief complaint of fever.  She reports a fever up to 102 for more than a week.  She had a cough last week, but this has been improving.  She has had a rash to her bilateral lower legs for the last 5 days that has been gradually improving. She denies any chest pain, shortness of breath, headache, nausea, vomiting, numbness, weakness.  She also denies any GU complaints.  The patient was seen in the ER on 10/25 and chest x-ray demonstrated multiple cavitary lesions of the bilateral lungs that were concerning for septic emboli.  CT scan was recommended for further evaluation.  Initial blood culture was negative for growth.  Urine culture grew Streptococcus viridans.  The hospitalist team was called for admission, but the patient left AMA prior to being admitted.  Reports that she had to leave to take care of some business, but received a call and was told to come back "because I have infection in the valves of my heart."   She states "I just don't feel well. I also think I'm dope sick. Look at me, I've got the shakes and I'm really sweaty. I'm in withdrawal."   She reports that she last used IV heroin yesterday morning, prior to coming to the ER.  However, per triage nursing note, patient actively appears under the influence of drugs in triage.  Staff also reports that a needle was found by another individual in the ER after the patient used the restroom while she was in the waiting room.      The history is provided by the patient. No language interpreter was used.  Fever Associated  symptoms: cough and rash   Associated symptoms: no chest pain, no chills, no confusion, no diarrhea, no dysuria, no headaches, no nausea and no vomiting     Past Medical History:  Diagnosis Date   History of RPR test 07/21/2016   02/2016: 1:1 with negative TPA testing [ ]  f/u 28wks [ ]  f/u admit RPR [ ]  f/u PP RPR   History of self-harm    Opioid abuse ([place])    Polysubstance abuse ([place])     Patient Active Problem List   Diagnosis Date Noted   Substance induced mood disorder ([place]) 04/19/2018   Polysubstance (including opioids) dependence, daily use ([place]) 02/03/2018   Polysubstance dependence including opioid type drug without complication, episodic abuse ([place]) 12/16/2017   Major depressive disorder, recurrent severe without psychotic features ([place]) 12/16/2017   Normal labor 10/16/2016   Cigarette smoker 08/18/2016   Pregnancy complicated by subutex maintenance, antepartum ([place]) 08/04/2016   Hepatitis C, chronic, maternal, antepartum ([place]) 08/04/2016   Supervision of high-risk pregnancy 07/21/2016   History of substance abuse ([place]) 07/21/2016   ASCUS with positive high risk HPV 07/21/2016    Past Surgical History:  Procedure Laterality Date   TONSILLECTOMY       OB History    Gravida  2   Para  2   Term  2   Preterm      AB  Living  2     SAB      TAB      Ectopic      Multiple  0   Live Births  2            Home Medications    Prior to Admission medications   Medication Sig Start Date End Date Taking? Authorizing Provider  gabapentin (NEURONTIN) 400 MG capsule Take 400 mg by mouth 3 (three) times daily.  10/26/19  [provider]  mirtazapine (REMERON) 30 MG tablet Take 30 mg by mouth at bedtime.  10/26/19  [provider]  risperiDONE (RISPERDAL) 1 MG tablet Take 1 tablet (1 mg total) by mouth at bedtime. Patient not taking: Reported on 09/16/2018 07/13/18 10/26/19  Charm Rings, NP  traZODone (DESYREL) 50 MG  tablet Take 1 tablet (50 mg total) by mouth at bedtime as needed for sleep. Patient not taking: Reported on 09/16/2018 07/13/18 10/26/19  Charm Rings, NP    Family History Family History  Problem Relation Age of Onset   Vision loss Mother    Depression Mother    Ulcers Mother    Gallbladder disease Mother    Mental illness Mother    Thyroid disease Mother    Allergies Father    Vision loss Father    Vision loss Maternal Grandmother    Ulcers Maternal Grandmother    Gallbladder disease Maternal Grandmother    Diabetes Maternal Grandmother    Vision loss Maternal Grandfather    Heart disease Maternal Grandfather    Cancer Maternal Grandfather    Allergies Paternal Grandmother    Vision loss Paternal Grandmother    Gallbladder disease Paternal Grandmother    Arthritis Paternal Grandmother    Cancer Paternal Grandmother    Allergies Paternal Grandfather    Vision loss Paternal Grandfather     Social History Social History   Tobacco Use   Smoking status: Current Every Day Smoker    Packs/day: 0.50    Types: Cigarettes   Smokeless tobacco: Never Used  Substance Use Topics   Alcohol use: No   Drug use: Yes    Frequency: 5.0 times per week    Types: Cocaine, Marijuana, Methamphetamines    Comment: Heroin     Allergies   Dextromethorphan   Review of Systems Review of Systems  Constitutional: Positive for fever. Negative for activity change, chills and diaphoresis.  Respiratory: Positive for cough. Negative for shortness of breath and wheezing.   Cardiovascular: Negative for chest pain and palpitations.  Gastrointestinal: Negative for abdominal pain, blood in stool, diarrhea, nausea and vomiting.  Genitourinary: Negative for dysuria, frequency, hematuria, urgency, vaginal bleeding, vaginal discharge and vaginal pain.  Musculoskeletal: Negative for back pain.  Skin: Positive for rash.  Allergic/Immunologic: Negative for immunocompromised  state.  Neurological: Negative for syncope, weakness, numbness and headaches.  Psychiatric/Behavioral: Negative for confusion.     Physical Exam Updated Vital Signs BP 92/63    Pulse 90    Temp (!) 97.4 F (36.3 C) (Oral)    Resp 18    Ht 5' (1.524 m)    Wt 45.4 kg    LMP 09/27/2019 (Exact Date)    SpO2 99%    BMI 19.53 kg/m   Physical Exam Vitals signs and nursing note reviewed.  Constitutional:      General: She is not in acute distress.    Comments: Thin, cachectic female with numerous sores and scabs noted throughout her entire body.  No  acute distress.  She is anxious with intermittent agitation.  Intermittently tearful. She is not diaphoretic.  She is not tremulous.  HENT:     Head: Normocephalic.  Eyes:     Conjunctiva/sclera: Conjunctivae normal.  Neck:     Musculoskeletal: Neck supple.  Cardiovascular:     Rate and Rhythm: Normal rate and regular rhythm.     Pulses: Normal pulses.     Heart sounds: Normal heart sounds. No murmur. No friction rub. No gallop.   Pulmonary:     Effort: Pulmonary effort is normal. No respiratory distress.     Breath sounds: No stridor. No wheezing, rhonchi or rales.  Chest:     Chest wall: No tenderness.  Abdominal:     General: There is no distension.     Palpations: Abdomen is soft. There is no mass.     Tenderness: There is no abdominal tenderness. There is no right CVA tenderness, left CVA tenderness, guarding or rebound.     Hernia: No hernia is present.  Musculoskeletal:     Comments: Track marks noted to the arms  Skin:    General: Skin is warm.     Findings: No rash.     Comments: Purpura noted to the bilateral lower extremities.   Neurological:     Mental Status: She is alert.  Psychiatric:        Behavior: Behavior normal.        ED Treatments / Results  Labs (all labs ordered are listed, but only abnormal results are displayed) Labs Reviewed  COMPREHENSIVE METABOLIC PANEL - Abnormal; Notable for the following  components:      Result Value   Sodium 132 (*)    Chloride 94 (*)    Glucose, Bld 101 (*)    BUN 5 (*)    Calcium 8.2 (*)    Albumin 2.0 (*)    All other components within normal limits  CBC WITH DIFFERENTIAL/PLATELET - Abnormal; Notable for the following components:   RBC 2.92 (*)    Hemoglobin 7.7 (*)    HCT 25.6 (*)    Platelets 111 (*)    All other components within normal limits  CULTURE, BLOOD (ROUTINE X 2)  CULTURE, BLOOD (ROUTINE X 2)  URINE CULTURE  SARS CORONAVIRUS 2 (TAT 6-24 HRS)  CULTURE, BLOOD (SINGLE)  LACTIC ACID, PLASMA  LACTIC ACID, PLASMA  URINALYSIS, ROUTINE W REFLEX MICROSCOPIC    EKG None  Radiology Dg Chest 2 View  Result Date: 10/26/2019 CLINICAL DATA:  Evaluate cavitary lesions EXAM: CHEST - 2 VIEW COMPARISON:  10/22/2019 FINDINGS: Cardiac shadow is stable. Lungs are well aerated bilaterally. Cavitary lesions are again seen in both lungs worse on the left than the right. The overall appearance is relatively stable. No bony abnormality is seen. No effusion is noted. IMPRESSION: Cavitary lesions are again seen bilaterally and stable. Electronically Signed   By: Alcide CleverMark  Lukens M.D.   On: 10/26/2019 00:45    Procedures .Critical Care Performed by: Barkley BoardsMcDonald, Shloimy Michalski A, PA-C Authorized by: Barkley BoardsMcDonald, Taralyn Ferraiolo A, PA-C   Critical care provider statement:    Critical care time (minutes):  40   Critical care time was exclusive of:  Separately billable procedures and treating other patients and teaching time   Critical care was necessary to treat or prevent imminent or life-threatening deterioration of the following conditions:  Sepsis   Critical care was time spent personally by me on the following activities:  Ordering and performing treatments and interventions, ordering  and review of laboratory studies, ordering and review of radiographic studies, pulse oximetry, re-evaluation of patient's condition, review of old charts, examination of patient, evaluation of  patient's response to treatment, development of treatment plan with patient or surrogate and obtaining history from patient or surrogate   (including critical care time)  Medications Ordered in ED Medications  ceFEPIme (MAXIPIME) 2 g in sodium chloride 0.9 % 100 mL IVPB (2 g Intravenous New Bag/Given 10/26/19 0709)  vancomycin (VANCOCIN) IVPB 1000 mg/200 mL premix (has no administration in time range)  ceFEPIme (MAXIPIME) 2 g in sodium chloride 0.9 % 100 mL IVPB (has no administration in time range)  vancomycin (VANCOCIN) 500 mg in sodium chloride 0.9 % 100 mL IVPB (has no administration in time range)  acetaminophen (TYLENOL) tablet 650 mg (650 mg Oral Given 10/25/19 1737)  acetaminophen (TYLENOL) tablet 650 mg (650 mg Oral Given 10/26/19 0218)  sodium chloride 0.9 % bolus 1,000 mL (1,000 mLs Intravenous New Bag/Given 10/26/19 0704)  iohexol (OMNIPAQUE) 350 MG/ML injection 75 mL (75 mLs Intravenous Contrast Given 10/26/19 0731)     Initial Impression / Assessment and Plan / ED Course  I have reviewed the triage vital signs and the nursing notes.  Pertinent labs & imaging results that were available during my care of the patient were reviewed by me and considered in my medical decision making (see chart for details).        28 year old female history of IV heroin drug use, methamphetamine use, hepatitis C, and major depressive disorder presenting with fever, T-max 102, for more than a week accompanied by a cough and a pruritic rash to the bilateral lower legs.  The patient was seen in the ER on October 25 and chest x-ray was concerning for septic emboli.  CT was pending, but patient left AMA.  Febrile to 102.5 on arrival with tachycardia in the 130s.  She is not hypoxic and is normotensive.  Upon arrival to the room, she is afebrile after she was given Tylenol.  On exam, she has purpura to the bilateral lower extremities, but this appears improved from her last ER visit.  Although she  is loudly voicing concerns of withdrawal, including tremors and diaphoresis, she appears quite comfortable and the signs and symptoms are not observed by me at this time.   No leukocytosis.  She is mildly hyponatremic at 132.  Will give IV fluid bolus.  Hemoglobin is 7.7, almost a 2 g drop from previous?  She denies any active bleeding.  Since blood culture from previous ER visit was negative, will start the patient empirically on cefepime and vancomycin.  2 blood cultures were drawn at triage and a third single blood culture was drawn when the patient was roomed given the concern for endocarditis.  CT chest has been ordered and is pending.  COVID-19 test has been collected and is pending.  Of note, there was concern when the patient arrived at that she was actively intoxicated.  Staff also notes that a needle was found by another individual in the ER after the patient was noted to have used the restroom.  Consult to the hospitalist team and Dr. Ophelia Charter will admit.  The patient appears reasonably stabilized for admission considering the current resources, flow, and capabilities available in the ED at this time, and I doubt any other [facility] requiring further screening and/or treatment in the ED prior to admission.  Final Clinical Impressions(s) / ED Diagnoses   Final diagnoses:  Sepsis without acute organ  dysfunction, due to unspecified organism [facility])  IVDU (intravenous drug user)    ED Discharge Orders    None       Barkley Boards, PA-C 10/26/19 0736    Nira Conn, MD 10/27/19 0110

## 2019-10-26 NOTE — ED Notes (Signed)
ED TO INPATIENT HANDOFF REPORT  ED Nurse Name and Phone #: Magnus IvanLouie, RN [phone]  S Name/Age/Gender [name] 28 y.o. female Room/Bed: 025C/025C  Code Status   Code Status: Full Code  Home/SNF/Other Home Patient oriented to: self, place, time and situation Is this baseline? Yes   Triage Complete: Triage complete  Chief Complaint Septic Heart Valve  Triage Note Pt returns to be admitted, she states "I had to leave to take care of some business, I was called to come back because I have an infection in my heart valve.  Pt appears to be high at triage, she is cooperative and calm. She uses heroin and methamphetamine, it has been 12 hrs since last use. Tylenol will be given at triage, the wait time was expressed. EDP notified.   Report from imaging:  Findings are concerning for septic embolism in the reported setting of IVDU. Recommend CT to further evaluate.   Allergies Allergies  Allergen Reactions  . Dextromethorphan Swelling    Level of Care/Admitting Diagnosis ED Disposition    ED Disposition Condition Comment   Admit  [facility] [[id number]]  Level of Care: Progressive [102]  Admit to Progressive based on following criteria: MULTISYSTEM THREATS such as stable sepsis, metabolic/electrolyte imbalance with or without encephalopathy that is responding to early treatment.  Admit to Progressive based on following criteria: ACUTE MENTAL DISORDER-RELATED Drug/Alcohol Ingestion/Overdose/Withdrawal, Suicidal Ideation/attempt requiring safety sitter and < Q2h monitoring/assessments, moderate to severe agitation that is managed with medication/sitter, CIWA-Ar score < 20.  Covid Evaluation: Confirmed COVID Negative  Diagnosis: Sepsis [facility](HCC) [[id number]]) [[id number]]  Admitting Physician: Jonah BlueYATES, JENNIFER [[id number]]  Attending Physician: Jonah BlueYATES, JENNIFER [[id number]]  Estimated length of stay: 3 - 4 days  Certification:: I certify this patient will need inpatient services for  at least 2 midnights  PT Class (Do Not Modify): Inpatient [101]  PT Acc Code (Do Not Modify): Private [1]       B Medical/Surgery History Past Medical History:  Diagnosis Date  . History of RPR test 07/21/2016   02/2016: 1:1 with negative TPA testing [ ]  f/u 28wks [ ]  f/u admit RPR [ ]  f/u PP RPR  . History of self-harm   . Opioid abuse (HCC)   . Polysubstance abuse [facility](HCC)    Past Surgical History:  Procedure Laterality Date  . TONSILLECTOMY       A IV Location/Drains/Wounds Patient Lines/Drains/Airways Status   Active Line/Drains/Airways    Name:   Placement date:   Placement time:   Site:   Days:   Peripheral IV 10/26/19 Right;Posterior Forearm   10/26/19    0642    Forearm   less than 1          Intake/Output Last 24 hours  Intake/Output Summary (Last 24 hours) at 10/26/2019 1701 Last data filed at 10/26/2019 0843 Gross per 24 hour  Intake 1004.16 ml  Output -  Net 1004.16 ml    Labs/Imaging Results for orders placed or performed during the hospital encounter of 10/25/19 (from the past 48 hour(s))  Comprehensive metabolic panel     Status: Abnormal   Collection Time: 10/25/19  5:25 PM  Result Value Ref Range   Sodium 132 (L) 135 - 145 mmol/L   Potassium 4.0 3.5 - 5.1 mmol/L   Chloride 94 (L) 98 - 111 mmol/L   CO2 27 22 - 32 mmol/L   Glucose, Bld 101 (H) 70 - 99 mg/dL   BUN 5 (L) 6 - 20  mg/dL   Creatinine, Ser 4.09 0.44 - 1.00 mg/dL   Calcium 8.2 (L) 8.9 - 10.3 mg/dL   Total Protein 7.2 6.5 - 8.1 g/dL   Albumin 2.0 (L) 3.5 - 5.0 g/dL   AST 20 15 - 41 U/L   ALT 15 0 - 44 U/L   Alkaline Phosphatase 53 38 - 126 U/L   Total Bilirubin 0.6 0.3 - 1.2 mg/dL   GFR calc non Af Amer >60 >60 mL/min   GFR calc Af Amer >60 >60 mL/min   Anion gap 11 5 - 15    Comment: Performed at [facility], [address] [place], [place] [address]  Lactic acid, plasma     Status: None   Collection Time: 10/25/19  5:25 PM  Result Value Ref Range   Lactic Acid, Venous  0.8 0.5 - 1.9 mmol/L    Comment: Performed at [facility], [address], [place], [place] [address]  CBC with Differential     Status: Abnormal   Collection Time: 10/25/19  5:25 PM  Result Value Ref Range   WBC 7.0 4.0 - 10.5 K/uL   RBC 2.92 (L) 3.87 - 5.11 MIL/uL   Hemoglobin 7.7 (L) 12.0 - 15.0 g/dL   HCT 56.2 (L) 13.0 - 86.5 %   MCV 87.7 80.0 - 100.0 fL   MCH 26.4 26.0 - 34.0 pg   MCHC 30.1 30.0 - 36.0 g/dL   RDW 78.4 69.6 - 29.5 %   Platelets 111 (L) 150 - 400 K/uL    Comment: REPEATED TO VERIFY Immature Platelet Fraction may be clinically indicated, consider ordering this additional test [id number] CONSISTENT WITH PREVIOUS RESULT    nRBC 0.0 0.0 - 0.2 %   Neutrophils Relative % 74 %   Neutro Abs 5.2 1.7 - 7.7 K/uL   Lymphocytes Relative 20 %   Lymphs Abs 1.4 0.7 - 4.0 K/uL   Monocytes Relative 5 %   Monocytes Absolute 0.3 0.1 - 1.0 K/uL   Eosinophils Relative 1 %   Eosinophils Absolute 0.0 0.0 - 0.5 K/uL   Basophils Relative 0 %   Basophils Absolute 0.0 0.0 - 0.1 K/uL   Immature Granulocytes 0 %   Abs Immature Granulocytes 0.03 0.00 - 0.07 K/uL    Comment: Performed at [facility], [address] [place], [place] [address]  SARS CORONAVIRUS 2 (TAT 6-24 HRS) Nasopharyngeal Nasopharyngeal Swab     Status: None   Collection Time: 10/26/19  6:55 AM   Specimen: Nasopharyngeal Swab  Result Value Ref Range   SARS Coronavirus 2 NEGATIVE NEGATIVE    Comment: (NOTE) SARS-CoV-2 target nucleic acids are NOT DETECTED. The SARS-CoV-2 RNA is generally detectable in upper and lower respiratory specimens during the acute phase of infection. Negative results do not preclude SARS-CoV-2 infection, do not rule out co-infections with other pathogens, and should not be used as the sole basis for treatment or other patient management decisions. Negative results must be combined with clinical observations, patient history, and epidemiological information. The  expected result is Negative. Fact Sheet for Patients: HairSlick.no Fact Sheet for Healthcare Providers: quierodirigir.com This test is not yet approved or cleared by the [place] FDA and  has been authorized for detection and/or diagnosis of SARS-CoV-2 by FDA under an Emergency Use Authorization (EUA). This EUA will remain  in effect (meaning this test can be used) for the duration of the COVID-19 declaration under Section 56 4(b)(1) of the Act, 21 U.S.C.  section 360bbb-3(b)(1), unless the authorization is terminated or revoked sooner. Performed at [facility], [address] [place], [place] [address]   Pregnancy, urine     Status: None   Collection Time: 10/26/19  7:43 AM  Result Value Ref Range   Preg Test, Ur NEGATIVE NEGATIVE    Comment:        THE SENSITIVITY OF THIS METHODOLOGY IS >20 mIU/mL. Performed at [facility], [address], [place], [place] [address]   Urine rapid drug screen (hosp performed)     Status: Abnormal   Collection Time: 10/26/19  7:43 AM  Result Value Ref Range   Opiates POSITIVE (A) NONE DETECTED   Cocaine NONE DETECTED NONE DETECTED   Benzodiazepines NONE DETECTED NONE DETECTED   Amphetamines NONE DETECTED NONE DETECTED   Tetrahydrocannabinol NONE DETECTED NONE DETECTED   Barbiturates NONE DETECTED NONE DETECTED    Comment: (NOTE) DRUG SCREEN FOR MEDICAL PURPOSES ONLY.  IF CONFIRMATION IS NEEDED FOR ANY PURPOSE, NOTIFY LAB WITHIN 5 DAYS. LOWEST DETECTABLE LIMITS FOR URINE DRUG SCREEN Drug Class                     Cutoff (ng/mL) Amphetamine and metabolites    1000 Barbiturate and metabolites    200 Benzodiazepine                 200 Tricyclics and metabolites     300 Opiates and metabolites        300 Cocaine and metabolites        300 THC                            50 Performed at [facility], [address] [place], [place] [address]   Urinalysis,  Routine w reflex microscopic     Status: Abnormal   Collection Time: 10/26/19  9:19 AM  Result Value Ref Range   Color, Urine YELLOW YELLOW   APPearance CLOUDY (A) CLEAR   Specific Gravity, Urine 1.031 (H) 1.005 - 1.030   pH 6.0 5.0 - 8.0   Glucose, UA NEGATIVE NEGATIVE mg/dL   Hgb urine dipstick SMALL (A) NEGATIVE   Bilirubin Urine NEGATIVE NEGATIVE   Ketones, ur NEGATIVE NEGATIVE mg/dL   Protein, ur NEGATIVE NEGATIVE mg/dL   Nitrite NEGATIVE NEGATIVE   Leukocytes,Ua MODERATE (A) NEGATIVE   RBC / HPF 11-20 0 - 5 RBC/hpf   WBC, UA >50 (H) 0 - 5 WBC/hpf   Bacteria, UA RARE (A) NONE SEEN   Squamous Epithelial / LPF 21-50 0 - 5   Mucus PRESENT     Comment: Performed at [facility], [address], [place], [place] [address]  HIV Antibody (routine testing w rflx)     Status: None   Collection Time: 10/26/19 10:45 AM  Result Value Ref Range   HIV Screen 4th Generation wRfx NON REACTIVE NON REACTIVE    Comment: Performed at [facility], [address], [place], [place] [address]  Procalcitonin     Status: None   Collection Time: 10/26/19 10:45 AM  Result Value Ref Range   Procalcitonin <0.10 ng/mL    Comment:        Interpretation: PCT (Procalcitonin) <= 0.5 ng/mL: Systemic infection (sepsis) is not likely. Local bacterial infection is possible. (NOTE)       Sepsis PCT Algorithm  Lower Respiratory Tract                                      Infection PCT Algorithm    ----------------------------     ----------------------------         PCT < 0.25 ng/mL                PCT < 0.10 ng/mL         Strongly encourage             Strongly discourage   discontinuation of antibiotics    initiation of antibiotics    ----------------------------     -----------------------------       PCT 0.25 - 0.50 ng/mL            PCT 0.10 - 0.25 ng/mL               OR       >80% decrease in PCT            Discourage initiation of                                             antibiotics      Encourage discontinuation           of antibiotics    ----------------------------     -----------------------------         PCT >= 0.50 ng/mL              PCT 0.26 - 0.50 ng/mL               AND        <80% decrease in PCT             Encourage initiation of                                             antibiotics       Encourage continuation           of antibiotics    ----------------------------     -----------------------------        PCT >= 0.50 ng/mL                  PCT > 0.50 ng/mL               AND         increase in PCT                  Strongly encourage                                      initiation of antibiotics    Strongly encourage escalation           of antibiotics                                     -----------------------------  PCT <= 0.25 ng/mL                                                 OR                                        > 80% decrease in PCT                                     Discontinue / Do not initiate                                             antibiotics Performed at [facility], [place] [address] [facility], [place] [address]    Dg Chest 2 View  Result Date: 10/26/2019 CLINICAL DATA:  Evaluate cavitary lesions EXAM: CHEST - 2 VIEW COMPARISON:  10/22/2019 FINDINGS: Cardiac shadow is stable. Lungs are well aerated bilaterally. Cavitary lesions are again seen in both lungs worse on the left than the right. The overall appearance is relatively stable. No bony abnormality is seen. No effusion is noted. IMPRESSION: Cavitary lesions are again seen bilaterally and stable. Electronically Signed   By: Inez Catalina M.D.   On: 10/26/2019 00:45   Ct Chest W Contrast  Result Date: 10/26/2019 CLINICAL DATA:  Rash of bilateral lower extremities and face for 3 days. IV drug user. Cavitary lesion seen on chest x-ray. EXAM: CT CHEST WITH CONTRAST TECHNIQUE: Multidetector CT imaging of the chest  was performed during intravenous contrast administration. CONTRAST:  56mL OMNIPAQUE IOHEXOL 350 MG/ML SOLN COMPARISON:  Chest x-ray October 26 2019 FINDINGS: Cardiovascular: No significant vascular findings. Normal heart size. No pericardial effusion. Mediastinum/Nodes: No significant mediastinal lymphadenopathy is identified. Small lymph nodes are identified in bilateral hilum. There is no axillary lymphadenopathy. Lungs/Pleura: Multiple cavitary lesions are identified throughout bilateral lungs, largest measures 2.9 x 2.8 cm. There is no pleural effusion or pulmonary edema. Upper Abdomen: The spleen is probably enlarged, incompletely included. Musculoskeletal: No chest wall abnormality. No acute or significant osseous findings. IMPRESSION: Multiple cavitary lesions are identified throughout bilateral lungs. Findings may be due to abscesses from septic emboli. Electronically Signed   By: Abelardo Diesel M.D.   On: 10/26/2019 08:03    Pending Labs Unresulted Labs (From admission, onward)    Start     Ordered   10/27/19 2671  Basic metabolic panel  Tomorrow morning,   R     10/26/19 0934   10/27/19 0500  CBC  Tomorrow morning,   R     10/26/19 0934   10/26/19 0932  HCV Ab w Reflex to Quant PCR  Once,   STAT     10/26/19 0934   10/26/19 0932  RPR  Once,   STAT     10/26/19 0934   10/26/19 0610  Culture, blood (single)  ONCE - STAT,   STAT     10/26/19 0610   10/26/19 0227  Blood culture (routine x 2)  BLOOD CULTURE X 2,   STAT     10/26/19 2458  10/26/19 0227  Urine culture  ONCE - STAT,   STAT     10/26/19 0227          Vitals/Pain Today's Vitals   10/26/19 1000 10/26/19 1100 10/26/19 1200 10/26/19 1528  BP: (!) 92/58 95/69 109/73 111/75  Pulse:  (!) 103 (!) 115 (!) 116  Resp: (!) 24 (!) Temp:    98.8 F (37.1 C)  TempSrc:      SpO2:  100% 99% 98%  Weight:      Height:      PainSc:        Isolation Precautions No active isolations  Medications Medications   vancomycin (VANCOCIN) 500 mg in sodium chloride 0.9 % 100 mL IVPB (has no administration in time range)  enoxaparin (LOVENOX) injection 40 mg (40 mg Subcutaneous Not Given 10/26/19 1148)  sodium chloride flush (NS) 0.9 % injection 3 mL (3 mLs Intravenous Not Given 10/26/19 1032)  lactated ringers infusion ( Intravenous New Bag/Given 10/26/19 1117)  acetaminophen (TYLENOL) tablet 650 mg (has no administration in time range)    Or  acetaminophen (TYLENOL) suppository 650 mg (has no administration in time range)  docusate sodium (COLACE) capsule 100 mg (100 mg Oral Not Given 10/26/19 1127)  polyethylene glycol (MIRALAX / GLYCOLAX) packet 17 g (has no administration in time range)  ondansetron (ZOFRAN) tablet 4 mg (has no administration in time range)    Or  ondansetron (ZOFRAN) injection 4 mg (has no administration in time range)  buprenorphine-naloxone (SUBOXONE) 2-0.5 mg per SL tablet 2 tablet (2 tablets Sublingual Given 10/26/19 1119)  buprenorphine-naloxone (SUBOXONE) 8-2 mg per SL tablet 1 tablet (has no administration in time range)  dicyclomine (BENTYL) tablet 20 mg (has no administration in time range)  hydrOXYzine (ATARAX/VISTARIL) tablet 25 mg (has no administration in time range)  loperamide (IMODIUM) capsule 2-4 mg (has no administration in time range)  methocarbamol (ROBAXIN) tablet 500 mg (has no administration in time range)  naproxen (NAPROSYN) tablet 500 mg (has no administration in time range)  nicotine (NICODERM CQ - dosed in mg/24 hours) patch 14 mg (14 mg Transdermal Patch Applied 10/26/19 1420)  cefTRIAXone (ROCEPHIN) 2 g in sodium chloride 0.9 % 100 mL IVPB (2 g Intravenous New Bag/Given 10/26/19 1419)  acetaminophen (TYLENOL) tablet 650 mg (650 mg Oral Given 10/25/19 1737)  acetaminophen (TYLENOL) tablet 650 mg (650 mg Oral Given 10/26/19 0218)  ceFEPIme (MAXIPIME) 2 g in sodium chloride 0.9 % 100 mL IVPB (0 g Intravenous Stopped 10/26/19 0809)  vancomycin (VANCOCIN)  IVPB 1000 mg/200 mL premix (0 mg Intravenous Stopped 10/26/19 0921)  sodium chloride 0.9 % bolus 1,000 mL (0 mLs Intravenous Stopped 10/26/19 0843)  iohexol (OMNIPAQUE) 350 MG/ML injection 75 mL (75 mLs Intravenous Contrast Given 10/26/19 0731)  sodium chloride 0.9 % bolus 500 mL (0 mLs Intravenous Stopped 10/26/19 1118)    Mobility walks     Focused Assessments Sepsis Work up   R Recommendations: See Admitting Provider Note  Report given to:   Additional Notes:

## 2019-10-26 NOTE — TOC Initial Note (Signed)
Transition of Care [facility]) - Initial/Assessment Note    Patient Details  Name: [name] MRN: [id number] Date of Birth: 1991-03-14  Transition of [facility]) CM/SW Contact:    Archie Endo, LCSW Phone Number: 10/26/2019, 2:37 PM  Clinical Narrative:                 CSW met with patient at bedside to discuss her current substance use. Patient admitted to using heroin daily and methamphetamines frequently. Patient reports she has friends in [place] who agreed to take her into their home if she were to come to the hospital to get clean. Patient reports she uses at least a gram of heroin daily. Patient reports she uses by herself mostly. Patient reports she has two daughters, ages 39 and 69 that live with their paternal aunt. Patient reports she has used Subutex in the past for a year and a half and that it helped her not use during pregnancy. Patient reports the father of her children is an active heroin user and influenced her to relapse. Patient reports she does not want residential treatment but prefers outpatient instead. Patient reports she has depression but it has never been treated. CSW and patient discussed various treatment options and she was agreeable to accepting a list of resources for MAT and outpatient treatment centers. Patient was pleasant and engaged throughout conversation. CSW encouraged patient to reach out for assistance if needs arise, she was agreeable.  Expected Discharge Plan: Home/Self Care Barriers to Discharge: Continued Medical Work up   Patient Goals and CMS Choice Patient states their goals for this hospitalization and ongoing recovery are:: Go to outpatient treatment for substance abuse      Expected Discharge Plan and Services Expected Discharge Plan: Home/Self Care                                              Prior Living Arrangements/Services   Lives with:: Self          Need for Family Participation in Patient Care: No  (Comment) Care giver support system in place?: No (comment)   Criminal Activity/Legal Involvement Pertinent to Current Situation/Hospitalization: No - Comment as needed  Activities of Daily Living      Permission Sought/Granted   Permission granted to share information with : No              Emotional Assessment Appearance:: Appears stated age, Developmentally appropriate Attitude/Demeanor/Rapport: Engaged Affect (typically observed): Accepting Orientation: : Oriented to Self, Oriented to Place, Oriented to  Time, Oriented to Situation Alcohol / Substance Use: Tobacco Use, Illicit Drugs Psych Involvement: No (comment)  Admission diagnosis:  Septic Heart Valve Patient Active Problem List   Diagnosis Date Noted  . Sepsis ([place]) 10/26/2019  . Septic embolism ([place]) 10/26/2019  . Cavitary pneumonia 10/26/2019  . 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 Substance induced mood disorder ([place]) 04/19/2018  . Polysubstance (including opioids) dependence, daily use ([place]) 02/03/2018  . Polysubstance dependence including opioid type drug without complication, episodic abuse ([place]) 12/16/2017  . Major depressive disorder, recurrent severe without psychotic features ([place]) 12/16/2017  . Normal labor 10/16/2016  . Cigarette smoker 08/18/2016  . Pregnancy complicated by subutex maintenance, antepartum ([place]) 08/04/2016  . Hepatitis C, chronic, maternal, antepartum ([place]) 08/04/2016  . Supervision of high-risk pregnancy 07/21/2016  . ASCUS with positive high risk HPV  07/21/2016   PCP:  Denita Lung, MD Pharmacy:   [id number] [place]Phone: [phone]: [phone] [id number] [id number] [id number]: [phone]: [phone]   Social Determinants of Health (SDOH) Interventions     Readmission Risk Interventions No flowsheet data found.

## 2019-10-26 NOTE — ED Notes (Signed)
Patient is aware she needs to give [place] a urine sample

## 2019-10-26 NOTE — ED Notes (Signed)
Pt cussing and upset about wait time. This RN apologized for the wait and reassured pt we are working on getting her a room.

## 2019-10-26 NOTE — ED Provider Notes (Addendum)
Attestation: Medical screening examination/treatment/procedure(s) were conducted as a shared visit with non-physician practitioner(s) and myself.  I personally evaluated the patient during the encounter.  Briefly, the patient is a 28 y.o. female with h/o IVDU who left AMA prior to being admitted for likely endocarditis, returns to be admitted.   Vitals:   10/25/19 1709 10/26/19 0214  BP: 123/82 117/72  Pulse: (!) 130 (!) 130  Resp: 16 20  Temp: (!) 102.5 F (39.2 C) (!) 102.2 F (39 C)  SpO2: 98% 100%    CONSTITUTIONAL:  ill-appearing, NAD NEURO:  Alert and oriented x 3, no focal deficits EYES:  pupils equal and reactive ENT/NECK:  trachea midline, no JVD CARDIO:  tachy rate, reg rhythm, no MRG PULM:  None labored breathing GI/GU:  Abdomin non-distended MSK/SPINE:  No gross deformities, no edema SKIN:  Improved purpura to BLE PSYCH:  Appropriate speech and behavior    EKG Interpretation  Date/Time: 25-Oct-2019 17:15:28   Ventricular Rate:  133  PR Interval:   128 QRS Duration:  70 QT Interval:   292 QTC Calculation:  434 R Axis:     Text Interpretation:  sinus tachycardia. Rightward axis.       Work up worsening anemia. Noted to have cavitary lung lesions. No leukocytosis.  Empiric Abx started. Admitted for further management.    CRITICAL CARE Performed by: Grayce Sessions Cardama Total critical care time: 10 minutes Critical care time was exclusive of separately billable procedures and treating other patients. Critical care was necessary to treat or prevent imminent or life-threatening deterioration. Critical care was time spent personally by me on the following activities: development of treatment plan with patient and/or surrogate as well as nursing, discussions with consultants, evaluation of patient's response to treatment, examination of patient, obtaining history from patient or surrogate, ordering and performing treatments and interventions, ordering and review  of laboratory studies, ordering and review of radiographic studies, pulse oximetry and re-evaluation of patient's condition.        Fatima Blank, MD 10/27/19 [phone]

## 2019-10-26 NOTE — Progress Notes (Signed)
Pharmacy Antibiotic Note  [name] is a 28 y.o. female admitted on 10/25/2019 with sepsis.  Pharmacy has been consulted for vancomycin and cefepime dosing.  Plan: Vancomycin 1gm IV x 1 then 500 mg IV q12 hours Cefepime 2gm IV q8 hours F/u renal function, cultures and clincial course     Temp (24hrs), Avg:102.4 F (39.1 C), Min:102.2 F (39 C), Max:102.5 F (39.2 C)  Recent Labs  Lab 10/22/19 1634 10/25/19 1725  WBC 7.0 7.0  CREATININE 0.81 0.86  LATICACIDVEN 1.1 0.8    Estimated Creatinine Clearance: 69.2 mL/min (by C-G formula based on SCr of 0.86 mg/dL).    Allergies  Allergen Reactions  . Dextromethorphan Swelling     Thank you for allowing pharmacy to be a part of this patient's care.  Excell Seltzer [place] 10/26/2019 6:15 AM

## 2019-10-27 DIAGNOSIS — A4102 Sepsis due to Methicillin resistant Staphylococcus aureus: Principal | ICD-10-CM

## 2019-10-27 DIAGNOSIS — R6520 Severe sepsis without septic shock: Secondary | ICD-10-CM

## 2019-10-27 DIAGNOSIS — R7881 Bacteremia: Secondary | ICD-10-CM | POA: Diagnosis not present

## 2019-10-27 DIAGNOSIS — F1990 Other psychoactive substance use, unspecified, uncomplicated: Secondary | ICD-10-CM

## 2019-10-27 DIAGNOSIS — J189 Pneumonia, unspecified organism: Secondary | ICD-10-CM | POA: Diagnosis not present

## 2019-10-27 DIAGNOSIS — J9601 Acute respiratory failure with hypoxia: Secondary | ICD-10-CM

## 2019-10-27 DIAGNOSIS — B9562 Methicillin resistant Staphylococcus aureus infection as the cause of diseases classified elsewhere: Secondary | ICD-10-CM | POA: Diagnosis present

## 2019-10-27 DIAGNOSIS — F17210 Nicotine dependence, cigarettes, uncomplicated: Secondary | ICD-10-CM | POA: Diagnosis not present

## 2019-10-27 LAB — BLOOD CULTURE ID PANEL (REFLEXED)

## 2019-10-27 LAB — CULTURE, BLOOD (ROUTINE X 2): Culture: NO GROWTH

## 2019-10-27 LAB — BASIC METABOLIC PANEL
Anion gap: 6 (ref 5–15)
BUN: 5 mg/dL — ABNORMAL LOW (ref 6–20)
CO2: 28 mmol/L (ref 22–32)
Calcium: 7.9 mg/dL — ABNORMAL LOW (ref 8.9–10.3)
Chloride: 105 mmol/L (ref 98–111)
Creatinine, Ser: 0.69 mg/dL (ref 0.44–1.00)
GFR calc Af Amer: >60 mL/min (ref >60)
GFR calc non Af Amer: >60 mL/min (ref >60)
Glucose, Bld: 91 mg/dL (ref 70–99)
Potassium: 4 mmol/L (ref 3.5–5.1)
Sodium: 139 mmol/L (ref 135–145)

## 2019-10-27 LAB — HCV RNA QUANT
HCV Quantitative Log: 5.526 log10 IU/mL (ref >1.70)
HCV Quantitative: 336000 IU/mL (ref >50)

## 2019-10-27 LAB — CBC
HCT: 22.4 % — ABNORMAL LOW (ref 36.0–46.0)
Hemoglobin: 6.9 g/dL — CL (ref 12.0–15.0)
MCH: 26.6 pg (ref 26.0–34.0)
MCHC: 30.8 g/dL (ref 30.0–36.0)
MCV: 86.5 fL (ref 80.0–100.0)
Platelets: 120 10*3/uL — ABNORMAL LOW (ref 150–400)
RBC: 2.59 MIL/uL — ABNORMAL LOW (ref 3.87–5.11)
RDW: 15.6 % — ABNORMAL HIGH (ref 11.5–15.5)
WBC: 5.2 10*3/uL (ref 4.0–10.5)
nRBC: 0 % (ref 0.0–0.2)

## 2019-10-27 LAB — RPR
RPR Ser Ql: REACTIVE — AB
RPR Titer: 1:1 {titer}

## 2019-10-27 LAB — URINE CULTURE: Culture: NO GROWTH

## 2019-10-27 LAB — ABO/RH: ABO/RH(D): A POS

## 2019-10-27 LAB — PREPARE RBC (CROSSMATCH)

## 2019-10-27 MED ORDER — SODIUM CHLORIDE 0.9% IV SOLUTION: Freq: Once | INTRAVENOUS | Status: DC

## 2019-10-27 NOTE — Progress Notes (Addendum)
[facility]  Date of Admission:  10/25/2019      Total days of antibiotics 2  Vancomycin day 2         ASSESSMENT: [name] is a 28 y.o. female now found to have MRSA bacteremia in the setting of bilateral cavitary pneumonia and vasculitic rash. Will continue vancomycin alone for treatment. Fortunately her lung exam is better than I would expect given her CT scan. She has TEE scheduled on Monday to assess for any vegetations.   Vasculitis Rash appears to be improving nicely. Most likely related to MRSA bacteremia.   Hep C RNA pending to establish chronic infection (she has not had any testing since pregnancy in 2017. Hep B sAg neg, HIV non reactive.   Opioid dependence, now on suboxone. Appreciate Dr. Marin Comment care. Some withdrawals last night with sweating but better now. Was previously requiring  buprenorphine in 3 divided doses.     PLAN: 1. Continue Vancomycin  2. Stop Ceftriaxone  3. Repeat blood cultures this evening 4. Start contact isolation for MRSA infection  5. TEE Monday 11/02 6. Hold on PICC line for now  Dr. Orvan Falconer is on call this weekend and can be reached at [phone] for any ID related questions. Will see her back on Monday after her TEE.   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 Will await confirmatory treponemal test before deciding on treatment. It is possible this is another false positive result.     Principal Problem:   MRSA bacteremia Active Problems:   Sepsis (HCC)   Septic embolism (HCC)   Cavitary pneumonia   Vasculitis of skin   Cigarette smoker   Polysubstance (including opioids) dependence, daily use (HCC)   Injection of illicit drug within last 12 months   . sodium chloride   Intravenous Once  . buprenorphine-naloxone  1 tablet Sublingual BID  . docusate sodium  100 mg Oral BID  .  enoxaparin (LOVENOX) injection  40 mg Subcutaneous Q24H  . nicotine  14 mg Transdermal Daily  . sodium chloride flush  3 mL Intravenous Q12H    SUBJECTIVE: Some withdrawal last PM with sweating. Better now. She only uses just enough heroin to prevent withdrawal per day and uses meth to get high. Was previously taking 24 mg bup over TID dosing.   Interval history -  Febrile to 101.7 las night. WBC normal @ 5.2.  Blood cultures +4/4 bottles on gram stain for MRSA. No growth on plates yet.    Review of Systems: Review of Systems  Constitutional: Positive for chills and fever. Negative for diaphoresis and malaise/fatigue.  Respiratory: Positive for cough. Negative for sputum production and shortness of breath.   Cardiovascular: Negative for chest pain and leg swelling.  Gastrointestinal: Negative for abdominal pain and vomiting.  Genitourinary: Negative for dysuria and urgency.  Musculoskeletal: Negative for back pain and joint pain.  Skin: Positive for itching (light itching) and rash.  Neurological: Negative for focal weakness, weakness and headaches.    Allergies  Allergen Reactions  . Dextromethorphan Swelling    OBJECTIVE: Vitals:   10/27/19 0530 10/27/19 0600 10/27/19 0630 10/27/19 0700  BP:  99/72  90/68  Pulse:    97  Resp: 19 (!) Temp:    98.2 F (36.8 C)  TempSrc:    Oral  SpO2:      Weight:      Height:       Body mass index is 19.53 kg/m.   Physical Exam Constitutional:      Comments: Resting quietly in bed. Was on the phone with close friend.   HENT:     Mouth/Throat:     Mouth: Mucous membranes are moist.     Pharynx: No oropharyngeal exudate.  Eyes:     General: No scleral icterus.    Pupils: Pupils are equal, round, and reactive to light.  Cardiovascular:     Rate and Rhythm: Normal rate and regular rhythm.     Heart sounds: No murmur.  Pulmonary:     Effort: Pulmonary effort is normal.     Breath sounds: Rhonchi (occasional )  present.  Abdominal:     General: Bowel sounds are normal.     Palpations: Abdomen is soft.  Musculoskeletal: Normal range of motion.        General: No tenderness.  Skin:    General: Skin is warm and dry.     Capillary Refill: Capillary refill takes less than 2 seconds.     Findings: Rash (rash is light salmon colored and more coalesced today. Flat. Not many areas that have necrosed. ) present.  Neurological:     Mental Status: She is alert and oriented to person, place, and time.     Lab Results Lab Results  Component Value Date   WBC 5.2 10/27/2019   HGB 6.9 (LL) 10/27/2019   HCT 22.4 (L) 10/27/2019   MCV 86.5 10/27/2019   PLT 120 (L) 10/27/2019    Lab Results  Component Value Date   CREATININE 0.69 10/27/2019   BUN 5 (L) 10/27/2019   NA 139 10/27/2019   K 4.0 10/27/2019   CL 105 10/27/2019   CO2 28 10/27/2019    Lab Results  Component Value Date   ALT 15 10/25/2019   AST 20 10/25/2019   ALKPHOS 53 10/25/2019   BILITOT 0.6 10/25/2019     Microbiology: Recent Results (from the past 240 hour(s))  Blood Culture (routine x 2)     Status: None   Collection Time: 10/22/19  7:39 PM   Specimen: BLOOD  Result Value Ref Range Status   Specimen Description BLOOD RIGHT ANTECUBITAL  Final   Special Requests   Final    BOTTLES DRAWN AEROBIC ONLY Blood Culture results may not be optimal due to an inadequate volume of blood received in culture bottles   Culture   Final    NO GROWTH 5 DAYS Performed at [facility], [address] [place], [place] [address]    Report Status 10/27/2019 FINAL  Final  Urine culture     Status: Abnormal   Collection Time: 10/22/19  9:10 PM   Specimen: In/Out Cath Urine  Result Value Ref Range Status   Specimen Description IN/OUT CATH URINE  Final   Special Requests   Final    NONE Performed at [facility], [address] [place], [place] [address]    Culture >=100,000 COLONIES/mL VIRIDANS STREPTOCOCCUS (A)  Final   Report  Status 10/25/2019 FINAL  Final  Blood culture (routine x 2)     Status: None (Preliminary result)   Collection Time: 10/26/19  6:28 AM   Specimen: BLOOD RIGHT HAND  Result Value Ref Range Status   Specimen Description BLOOD RIGHT HAND  Final   Special Requests   Final  BOTTLES DRAWN AEROBIC AND ANAEROBIC Blood Culture results may not be optimal due to an inadequate volume of blood received in culture bottles   Culture  Setup Time   Final    IN BOTH AEROBIC AND ANAEROBIC BOTTLES GRAM POSITIVE COCCI Organism ID to follow CRITICAL RESULT CALLED TO, READ BACK BY AND VERIFIED WITH: L SEAY PHARMD 10/27/19 0118 JDW    Culture   Final    NO GROWTH 1 DAY Performed at [facility], [address], [place], [place] [address]    Report Status PENDING  Incomplete  Blood Culture ID Panel (Reflexed)     Status: Abnormal   Collection Time: 10/26/19  6:28 AM  Result Value Ref Range Status   Enterococcus species NOT DETECTED NOT DETECTED Final   Listeria monocytogenes NOT DETECTED NOT DETECTED Final   Staphylococcus species DETECTED (A) NOT DETECTED Final    Comment: CRITICAL RESULT CALLED TO, READ BACK BY AND VERIFIED WITH: L SEAY PHARMD 10/27/19 0118 JDW    Staphylococcus aureus (BCID) DETECTED (A) NOT DETECTED Final    Comment: Methicillin (oxacillin)-resistant Staphylococcus aureus (MRSA). MRSA is predictably resistant to beta-lactam antibiotics (except ceftaroline). Preferred therapy is vancomycin unless clinically contraindicated. Patient requires contact precautions if  hospitalized. CRITICAL RESULT CALLED TO, READ BACK BY AND VERIFIED WITH: L SEAY PHARM 10/27/19 0118 JDW    Methicillin resistance DETECTED (A) NOT DETECTED Final    Comment: CRITICAL RESULT CALLED TO, READ BACK BY AND VERIFIED WITH: L SEAY PHARMD 10/27/19 0118 JDW    Streptococcus species NOT DETECTED NOT DETECTED Final   Streptococcus agalactiae NOT DETECTED NOT DETECTED Final   Streptococcus pneumoniae NOT  DETECTED NOT DETECTED Final   Streptococcus pyogenes NOT DETECTED NOT DETECTED Final   Acinetobacter baumannii NOT DETECTED NOT DETECTED Final   Enterobacteriaceae species NOT DETECTED NOT DETECTED Final   Enterobacter cloacae complex NOT DETECTED NOT DETECTED Final   Escherichia coli NOT DETECTED NOT DETECTED Final   Klebsiella oxytoca NOT DETECTED NOT DETECTED Final   Klebsiella pneumoniae NOT DETECTED NOT DETECTED Final   Proteus species NOT DETECTED NOT DETECTED Final   Serratia marcescens NOT DETECTED NOT DETECTED Final   Haemophilus influenzae NOT DETECTED NOT DETECTED Final   Neisseria meningitidis NOT DETECTED NOT DETECTED Final   Pseudomonas aeruginosa NOT DETECTED NOT DETECTED Final   Candida albicans NOT DETECTED NOT DETECTED Final   Candida glabrata NOT DETECTED NOT DETECTED Final   Candida krusei NOT DETECTED NOT DETECTED Final   Candida parapsilosis NOT DETECTED NOT DETECTED Final   Candida tropicalis NOT DETECTED NOT DETECTED Final    Comment: Performed at [facility], [address], [place], [place] [address]  Blood culture (routine x 2)     Status: None (Preliminary result)   Collection Time: 10/26/19  6:45 AM   Specimen: BLOOD RIGHT FOREARM  Result Value Ref Range Status   Specimen Description BLOOD RIGHT FOREARM  Final   Special Requests   Final    BOTTLES DRAWN AEROBIC AND ANAEROBIC Blood Culture adequate volume   Culture  Setup Time   Final    IN BOTH AEROBIC AND ANAEROBIC BOTTLES GRAM POSITIVE COCCI CRITICAL VALUE NOTED.  VALUE IS CONSISTENT WITH PREVIOUSLY REPORTED AND CALLED VALUE.    Culture   Final    NO GROWTH 1 DAY Performed at [facility], [address], [place], [place] [address]    Report Status PENDING  Incomplete  SARS CORONAVIRUS 2 (TAT 6-24 HRS) Nasopharyngeal  Nasopharyngeal Swab     Status: None   Collection Time: 10/26/19  6:55 AM   Specimen: Nasopharyngeal Swab  Result Value Ref Range Status   SARS Coronavirus 2 NEGATIVE  NEGATIVE Final    Comment: (NOTE) SARS-CoV-2 target nucleic acids are NOT DETECTED. The SARS-CoV-2 RNA is generally detectable in upper and lower respiratory specimens during the acute phase of infection. Negative results do not preclude SARS-CoV-2 infection, do not rule out co-infections with other pathogens, and should not be used as the sole basis for treatment or other patient management decisions. Negative results must be combined with clinical observations, patient history, and epidemiological information. The expected result is Negative. Fact Sheet for Patients: SugarRoll.be Fact Sheet for Healthcare Providers: https://www.woods-mathews.com/ This test is not yet approved or cleared by the [place] FDA and  has been authorized for detection and/or diagnosis of SARS-CoV-2 by FDA under an Emergency Use Authorization (EUA). This EUA will remain  in effect (meaning this test can be used) for the duration of the COVID-19 declaration under Section 56 4(b)(1) of the Act, 21 U.S.C. section 360bbb-3(b)(1), unless the authorization is terminated or revoked sooner. Performed at [facility], [place] [address], [place], [place] [address]   Urine culture     Status: None   Collection Time: 10/26/19  9:10 AM   Specimen: Urine, Random  Result Value Ref Range Status   Specimen Description URINE, RANDOM  Final   Special Requests SITE NOT SPECIFIED  Final   Culture   Final    NO GROWTH Performed at [facility], [place] [address] [place], [place] [address]    Report Status 10/27/2019 FINAL  Final  Culture, blood (single)     Status: None (Preliminary result)   Collection Time: 10/26/19  7:19 PM   Specimen: BLOOD LEFT HAND  Result Value Ref Range Status   Specimen Description BLOOD LEFT HAND  Final   Special Requests   Final    BOTTLES DRAWN AEROBIC ONLY Blood Culture adequate volume   Culture   Final    NO GROWTH < 12 HOURS  Performed at [facility], [place] [address], [place], [place] [address]    Report Status PENDING  Incomplete    Janene Madeira, MSN, NP-C [facility] [email or web address] Pager: [phone] Office: [phone] [place]: [phone]

## 2019-10-27 NOTE — Progress Notes (Signed)
PROGRESS NOTE    [name]  YPP:[id number] DOB: April 09, 1991 DOA: 10/25/2019 PCP: Ronnald Nian, MD    Brief Narrative:  28 year old female who presented with rash and fever.  She does have significant history for polysubstance abuse, history of self-harm.  She reported fever and rash for about 3 days.  She was seen in the emergency department 10/25, working diagnosis of septic emboli with possible endocarditis.  She left AGAINST MEDICAL ADVICE.  At home continue use intravenous drugs.  Due to persistent symptoms she returned to the hospital 10/28.  On her initial physical examination patient was febrile, blood pressure 92/63, 88/61, pulse rate 97, respirate 19, oxygen saturation 99%, her lungs were clear to auscultation bilaterally, heart S1-S2 present, rhythmic, positive murmur, abdomen was soft, no lower extremity edema, positive diffuse erythematous rash. Sodium 132, potassium 4.0, chloride 94, bicarb 27, glucose 101, BUN 5, creatinine 0.86, white count 7.0, hemoglobin 7.7, hematocrit 25.6, platelets 111.  SARS COVID-19 was negative.  Chest radiograph with cavitary lesions bilaterally.  CT chest with multiple focal cavitary lesions bilaterally.   Patient was admitted to the hospital working diagnosis of bilateral cavitary pneumonia, likely septic emboli, suspected endocarditis, complicated by sepsis.    Assessment & Plan:   Principal Problem:   Sepsis (HCC) Active Problems:   Cigarette smoker   Polysubstance (including opioids) dependence, daily use (HCC)   Septic embolism (HCC)   Cavitary pneumonia   Vasculitis of skin   1. Bilateral cavitary pneumonia, suspected septic emboli due to bacteremia (MRSA) and endocarditis/ sepsis present on admission. Blood cultures have turned positive to MRSA, urine positive for streptococcus viridans. Wbc at 5.2. Continue antibiotic therapy with IV Vancomycin, will need to follow up on echocardiogram,with TEE. Continue close follow up of cell  count, cultures and temperature curve.   Sepsis based on acute hypoxic respiratory failure due to increase metabolic demand. Oxygenation this after volume resuscitation is 98% on room air.    Will discontinue IV fluids for now.   2. Opiate dependence. No current sings of withdrawal, patient has been started on suboxone on admission, that will be continued for now.   3. Tobacco dependence. Continue smoking cessation. Nicotine patch.    DVT prophylaxis: enoxaparin   Code Status:  full Family Communication: no family at the bedside  Disposition Plan/ discharge barriers: pending clinical improvement.   Body mass index is 19.53 kg/m. Malnutrition Type:      Malnutrition Characteristics:      Nutrition Interventions:     RN Pressure Injury Documentation:     Consultants:   ID  Cardiology   Procedures:     Antimicrobials:   IV vancomycin.     Subjective: Patient has dyspnea but not chest pain, no cough or recurrent fever, her rash is improving, non tender any more.   Objective: Vitals:   10/27/19 0530 10/27/19 0600 10/27/19 0630 10/27/19 0700  BP:  99/72  90/68  Pulse:    97  Resp: 19 (!) 21 20 17   Temp:    98.2 F (36.8 C)  TempSrc:    Oral  SpO2:      Weight:      Height:        Intake/Output Summary (Last 24 hours) at 10/27/2019 0957 Last data filed at 10/26/2019 1500 Gross per 24 hour  Intake 460.11 ml  Output -  Net 460.11 ml   Filed Weights   10/26/19 0652  Weight: 45.4 kg    Examination:   General:  Not in pain or dyspnea, deconditioned  Neurology: Awake and alert, non focal  E ENT: mild pallor, no icterus, oral mucosa moist Cardiovascular: No JVD. S1-S2 present, rhythmic, no gallops, rubs, or murmurs. No lower extremity edema. Pulmonary: vesicular breath sounds bilaterally, adequate air movement, no wheezing, rhonchi or rales. Gastrointestinal. Abdomen with no organomegaly, non tender, no rebound or guarding Skin. Bilateral legs  macular rash, erythematous with central clearing, non palpable.  Musculoskeletal: no joint deformities     Data Reviewed: I have personally reviewed following labs and imaging studies  CBC: Recent Labs  Lab 10/22/19 1634 10/25/19 1725 10/27/19 0658  WBC 7.0 7.0 5.2  NEUTROABS 4.6 5.2  --   HGB 9.5* 7.7* 6.9*  HCT 31.0* 25.6* 22.4*  MCV 85.9 87.7 86.5  PLT 74* 111* 322*   Basic Metabolic Panel: Recent Labs  Lab 10/22/19 1634 10/25/19 1725 10/27/19 0658  NA 129* 132* 139  K 4.0 4.0 4.0  CL 92* 94* 105  CO2 26 27 28   GLUCOSE 107* 101* 91  BUN 6 5* 5*  CREATININE 0.81 0.86 0.69  CALCIUM 8.4* 8.2* 7.9*   GFR: Estimated Creatinine Clearance: 74.4 mL/min (by C-G formula based on SCr of 0.69 mg/dL). Liver Function Tests: Recent Labs  Lab 10/22/19 1634 10/25/19 1725  AST 22 20  ALT 16 15  ALKPHOS 60 53  BILITOT 0.4 0.6  PROT 7.9 7.2  ALBUMIN 2.2* 2.0*   No results for input(s): LIPASE, AMYLASE in the last 168 hours. No results for input(s): AMMONIA in the last 168 hours. Coagulation Profile: Recent Labs  Lab 10/22/19 1939  INR 1.0   Cardiac Enzymes: No results for input(s): CKTOTAL, CKMB, CKMBINDEX, TROPONINI in the last 168 hours. BNP (last 3 results) No results for input(s): PROBNP in the last 8760 hours. HbA1C: No results for input(s): HGBA1C in the last 72 hours. CBG: No results for input(s): GLUCAP in the last 168 hours. Lipid Profile: No results for input(s): CHOL, HDL, LDLCALC, TRIG, CHOLHDL, LDLDIRECT in the last 72 hours. Thyroid Function Tests: No results for input(s): TSH, T4TOTAL, FREET4, T3FREE, THYROIDAB in the last 72 hours. Anemia Panel: No results for input(s): VITAMINB12, FOLATE, FERRITIN, TIBC, IRON, RETICCTPCT in the last 72 hours.    Radiology Studies: I have reviewed all of the imaging during this hospital visit personally     Scheduled Meds: . sodium chloride   Intravenous Once  . buprenorphine-naloxone  1 tablet  Sublingual BID  . docusate sodium  100 mg Oral BID  . enoxaparin (LOVENOX) injection  40 mg Subcutaneous Q24H  . nicotine  14 mg Transdermal Daily  . sodium chloride flush  3 mL Intravenous Q12H   Continuous Infusions: . lactated ringers 100 mL/hr at 10/26/19 2103  . vancomycin 500 mg (10/27/19 0626)     LOS: 1 day        Disney Ruggiero Gerome Apley, MD

## 2019-10-27 NOTE — Progress Notes (Signed)
PHARMACY - PHYSICIAN COMMUNICATION CRITICAL VALUE ALERT - BLOOD CULTURE IDENTIFICATION (BCID)  [name] is an 28 y.o. female who presented to [facility] on 10/25/2019 with a chief complaint of sepsis  Assessment:  4/4 [facility] MRSA  Name of physician (or Provider) Contacted: M Norins Current antibiotics: vanc and rocephin  Changes to prescribed antibiotics recommended:  Rec dc rocephin and cont vanc  Results for orders placed or performed during the hospital encounter of 10/25/19  Blood Culture ID Panel (Reflexed) (Collected: 10/26/2019  6:28 AM)  Result Value Ref Range   Enterococcus species NOT DETECTED NOT DETECTED   Listeria monocytogenes NOT DETECTED NOT DETECTED   Staphylococcus species DETECTED (A) NOT DETECTED   Staphylococcus aureus (BCID) DETECTED (A) NOT DETECTED   Methicillin resistance DETECTED (A) NOT DETECTED   Streptococcus species NOT DETECTED NOT DETECTED   Streptococcus agalactiae NOT DETECTED NOT DETECTED   Streptococcus pneumoniae NOT DETECTED NOT DETECTED   Streptococcus pyogenes NOT DETECTED NOT DETECTED   Acinetobacter baumannii NOT DETECTED NOT DETECTED   Enterobacteriaceae species NOT DETECTED NOT DETECTED   Enterobacter cloacae complex NOT DETECTED NOT DETECTED   Escherichia coli NOT DETECTED NOT DETECTED   Klebsiella oxytoca NOT DETECTED NOT DETECTED   Klebsiella pneumoniae NOT DETECTED NOT DETECTED   Proteus species NOT DETECTED NOT DETECTED   Serratia marcescens NOT DETECTED NOT DETECTED   Haemophilus influenzae NOT DETECTED NOT DETECTED   Neisseria meningitidis NOT DETECTED NOT DETECTED   Pseudomonas aeruginosa NOT DETECTED NOT DETECTED   Candida albicans NOT DETECTED NOT DETECTED   Candida glabrata NOT DETECTED NOT DETECTED   Candida krusei NOT DETECTED NOT DETECTED   Candida parapsilosis NOT DETECTED NOT DETECTED   Candida tropicalis NOT DETECTED NOT DETECTED    Rahim Astorga Poteet 10/27/2019  1:31 AM

## 2019-10-27 NOTE — Progress Notes (Signed)
    [facility] has been requested to perform a transesophageal echocardiogram on Verna Czech for bacteremia.  After careful review of history and examination, the risks and benefits of transesophageal echocardiogram have been explained including risks of esophageal damage, perforation (1:10,000 risk), bleeding, pharyngeal hematoma as well as other potential complications associated with conscious sedation including aspiration, arrhythmia, respiratory failure and death. Alternatives to treatment were discussed, questions were answered. Patient is willing to proceed. Plan for general anesthesia during the procedure.  Braidyn Peace Ninfa Meeker, PA-C  10/27/2019 3:25 PM

## 2019-10-27 NOTE — Plan of Care (Signed)

## 2019-10-28 DIAGNOSIS — F17210 Nicotine dependence, cigarettes, uncomplicated: Secondary | ICD-10-CM | POA: Diagnosis not present

## 2019-10-28 DIAGNOSIS — J189 Pneumonia, unspecified organism: Secondary | ICD-10-CM | POA: Diagnosis not present

## 2019-10-28 DIAGNOSIS — R7881 Bacteremia: Secondary | ICD-10-CM | POA: Diagnosis not present

## 2019-10-28 DIAGNOSIS — F1990 Other psychoactive substance use, unspecified, uncomplicated: Secondary | ICD-10-CM | POA: Diagnosis not present

## 2019-10-28 LAB — CBC WITH DIFFERENTIAL/PLATELET
Abs Immature Granulocytes: 0.01 10*3/uL (ref 0.00–0.07)
Basophils Absolute: 0 10*3/uL (ref 0.0–0.1)
Basophils Relative: 1 %
Eosinophils Absolute: 0.2 10*3/uL (ref 0.0–0.5)
Eosinophils Relative: 3 %
HCT: 27 % — ABNORMAL LOW (ref 36.0–46.0)
Hemoglobin: 8.5 g/dL — ABNORMAL LOW (ref 12.0–15.0)
Immature Granulocytes: 0 %
Lymphocytes Relative: 38 %
Lymphs Abs: 2.1 10*3/uL (ref 0.7–4.0)
MCH: 27.2 pg (ref 26.0–34.0)
MCHC: 31.5 g/dL (ref 30.0–36.0)
MCV: 86.3 fL (ref 80.0–100.0)
Monocytes Absolute: 0.5 10*3/uL (ref 0.1–1.0)
Monocytes Relative: 8 %
Neutro Abs: 2.8 10*3/uL (ref 1.7–7.7)
Neutrophils Relative %: 50 %
Platelets: 154 10*3/uL (ref 150–400)
RBC: 3.13 MIL/uL — ABNORMAL LOW (ref 3.87–5.11)
RDW: 15.1 % (ref 11.5–15.5)
WBC: 5.5 10*3/uL (ref 4.0–10.5)
nRBC: 0 % (ref 0.0–0.2)

## 2019-10-28 LAB — HCV RT-PCR, QUANT (NON-GRAPH)
HCV log10: 5.394 log10 IU/mL
Hepatitis C Quantitation: 248000 IU/mL

## 2019-10-28 LAB — BASIC METABOLIC PANEL
Anion gap: 10 (ref 5–15)
BUN: 7 mg/dL (ref 6–20)
CO2: 26 mmol/L (ref 22–32)
Calcium: 8.1 mg/dL — ABNORMAL LOW (ref 8.9–10.3)
Chloride: 103 mmol/L (ref 98–111)
Creatinine, Ser: 0.77 mg/dL (ref 0.44–1.00)
GFR calc Af Amer: >60 mL/min (ref >60)
GFR calc non Af Amer: >60 mL/min (ref >60)
Glucose, Bld: 92 mg/dL (ref 70–99)
Potassium: 3.9 mmol/L (ref 3.5–5.1)
Sodium: 139 mmol/L (ref 135–145)

## 2019-10-28 LAB — TYPE AND SCREEN
ABO/RH(D): A POS
Antibody Screen: NEGATIVE
Unit division: 0

## 2019-10-28 LAB — HCV AB W REFLEX TO QUANT PCR: HCV Ab: >11 s/co ratio — ABNORMAL HIGH (ref 0.0–0.9)

## 2019-10-28 LAB — BPAM RBC
Blood Product Expiration Date: 202011282359
ISSUE DATE / TIME: 202010301159
Unit Type and Rh: 6200

## 2019-10-28 LAB — VANCOMYCIN, PEAK: Vancomycin Pk: 19 ug/mL — ABNORMAL LOW (ref 30–40)

## 2019-10-28 MED ORDER — BUPRENORPHINE HCL-NALOXONE HCL 8-2 MG SL SUBL
2.0000 | SUBLINGUAL_TABLET | Freq: Two times a day (BID) | SUBLINGUAL | Status: DC
  Administered 2019-10-28 – 2019-10-31 (×7): 2 via SUBLINGUAL
  Filled 2019-10-28 (×7): qty 2

## 2019-10-28 NOTE — Progress Notes (Signed)
PROGRESS NOTE    [name]  RKY:[id number] DOB: 06-10-1991 DOA: 10/25/2019 PCP: Ronnald Nian, MD    Brief Narrative:  28 year old female who presented with rash and fever.  She does have significant history for polysubstance abuse, history of self-harm.  She reported fever and rash for about 3 days.  She was seen in the emergency department 10/25, working diagnosis of septic emboli with possible endocarditis.  She left AGAINST MEDICAL ADVICE.  At home continue use intravenous drugs.  Due to persistent symptoms she returned to the hospital 10/28.  On her initial physical examination patient was febrile, blood pressure 92/63, 88/61, pulse rate 97, respirate 19, oxygen saturation 99%, her lungs were clear to auscultation bilaterally, heart S1-S2 present, rhythmic, positive murmur, abdomen was soft, no lower extremity edema, positive diffuse erythematous rash. Sodium 132, potassium 4.0, chloride 94, bicarb 27, glucose 101, BUN 5, creatinine 0.86, white count 7.0, hemoglobin 7.7, hematocrit 25.6, platelets 111.  SARS COVID-19 was negative.  Chest radiograph with cavitary lesions bilaterally.  CT chest with multiple focal cavitary lesions bilaterally.   Patient was admitted to the hospital with the working diagnosis of bilateral cavitary pneumonia, likely septic emboli, suspected endocarditis, complicated by sepsis.  Blood cultures positive for MRSA and antibiotic therapy has been narrowed to Vancomycin, follow TEE on Monday.   Urine culture positive for streptococcus viridans, but no urinary symptoms, she does have pyuria with more than 50 rbc. For now will rule out urinary tract infection.    Assessment & Plan:   Principal Problem:   MRSA bacteremia Active Problems:   Cigarette smoker   Polysubstance (including opioids) dependence, daily use (HCC)   Sepsis (HCC)   Septic embolism (HCC)   Cavitary pneumonia   Vasculitis of skin   Injection of illicit drug within last 12 months    1. Bilateral cavitary pneumonia, suspected septic emboli due to bacteremia (MRSA) and endocarditis/ sepsis present on admission.Sepsis based on acute hypoxic respiratory failure due to increase metabolic demand.  Patient has remained afebrile, wbc this am is 5.5. Will continue antibiotic therapy with IV Vancomycin per ID recommendations. Further work up with TEE on Monday. On auscultation patient has no murmurs or rubs. Will continue to hold on IV fluids, follow on cell count, further cultures and temperature curve.   Lower extremity rash/ vasculitis has resolved.   2. Opiate dependence with active withdrawals. Today patient with diaphoresis and anxiety, she has been on suboxone 1 tablet bid, will increase to 2 tablets bid and continue close monitoring. Continue methocarbamol and hydroxyzine.   3. Tobacco dependence. Smoking cessation. Continue with Nicotine patch.    DVT prophylaxis: enoxaparin   Code Status:  full Family Communication: no family at the bedside  Disposition Plan/ discharge barriers: pending clinical improvement.    Body mass index is 19.53 kg/m. Malnutrition Type:      Malnutrition Characteristics:      Nutrition Interventions:     RN Pressure Injury Documentation:     Consultants:   ID   Cardiology TEE   Procedures:     Antimicrobials:   IV vancomycin.     Subjective: Patient with no chest pain or dyspnea, positive diaphoresis and anxiety, no nausea or vomiting.   Objective: Vitals:   10/27/19 2305 10/28/19 0353 10/28/19 0500 10/28/19 0746  BP: (!) 96/55 (!) 91/58  (!) 98/55  Pulse:    85  Resp: 19 17 17 17   Temp: 98.3 F (36.8 C) 97.6 F (36.4 C)  97.8 F (36.6 C)  TempSrc: Oral Oral  Oral  SpO2: 100% 100%  100%  Weight:      Height:        Intake/Output Summary (Last 24 hours) at 10/28/2019 0902 Last data filed at 10/28/2019 0400 Gross per 24 hour  Intake 1605 ml  Output -  Net 1605 ml   Filed Weights   10/26/19  2595  Weight: 45.4 kg    Examination:   General: Not in pain or dyspnea, deconditioned  Neurology: Awake and alert, non focal  E ENT: mild pallor, no icterus, oral mucosa moist Cardiovascular: No JVD. S1-S2 present, rhythmic, no gallops, rubs, or murmurs. No lower extremity edema. Pulmonary: positive breath sounds bilaterally, adequate air movement, no wheezing, rhonchi or rales. Gastrointestinal. Abdomen with, no organomegaly, non tender, no rebound or guarding Skin. Macular rash in her lower extremities has improved, resolved.  Musculoskeletal: no joint deformities     Data Reviewed: I have personally reviewed following labs and imaging studies  CBC: Recent Labs  Lab 10/22/19 1634 10/25/19 1725 10/27/19 0658 10/28/19 0333  WBC 7.0 7.0 5.2 5.5  NEUTROABS 4.6 5.2  --  2.8  HGB 9.5* 7.7* 6.9* 8.5*  HCT 31.0* 25.6* 22.4* 27.0*  MCV 85.9 87.7 86.5 86.3  PLT 74* 111* 120* 638   Basic Metabolic Panel: Recent Labs  Lab 10/22/19 1634 10/25/19 1725 10/27/19 0658 10/28/19 0333  NA 129* 132* 139 139  K 4.0 4.0 4.0 3.9  CL 92* 94* 105 103  CO2 26 27 28 26   GLUCOSE 107* 101* 91 92  BUN 6 5* 5* 7  CREATININE 0.81 0.86 0.69 0.77  CALCIUM 8.4* 8.2* 7.9* 8.1*   GFR: Estimated Creatinine Clearance: 74.4 mL/min (by C-G formula based on SCr of 0.77 mg/dL). Liver Function Tests: Recent Labs  Lab 10/22/19 1634 10/25/19 1725  AST 22 20  ALT 16 15  ALKPHOS 60 53  BILITOT 0.4 0.6  PROT 7.9 7.2  ALBUMIN 2.2* 2.0*   No results for input(s): LIPASE, AMYLASE in the last 168 hours. No results for input(s): AMMONIA in the last 168 hours. Coagulation Profile: Recent Labs  Lab 10/22/19 1939  INR 1.0   Cardiac Enzymes: No results for input(s): CKTOTAL, CKMB, CKMBINDEX, TROPONINI in the last 168 hours. BNP (last 3 results) No results for input(s): PROBNP in the last 8760 hours. HbA1C: No results for input(s): HGBA1C in the last 72 hours. CBG: No results for input(s):  GLUCAP in the last 168 hours. Lipid Profile: No results for input(s): CHOL, HDL, LDLCALC, TRIG, CHOLHDL, LDLDIRECT in the last 72 hours. Thyroid Function Tests: No results for input(s): TSH, T4TOTAL, FREET4, T3FREE, THYROIDAB in the last 72 hours. Anemia Panel: No results for input(s): VITAMINB12, FOLATE, FERRITIN, TIBC, IRON, RETICCTPCT in the last 72 hours.    Radiology Studies: I have reviewed all of the imaging during this hospital visit personally     Scheduled Meds: . sodium chloride   Intravenous Once  . buprenorphine-naloxone  1 tablet Sublingual BID  . docusate sodium  100 mg Oral BID  . enoxaparin (LOVENOX) injection  40 mg Subcutaneous Q24H  . nicotine  14 mg Transdermal Daily  . sodium chloride flush  3 mL Intravenous Q12H   Continuous Infusions: . vancomycin 500 mg (10/28/19 0600)     LOS: 2 days        Jakaria Lavergne Gerome Apley, MD

## 2019-10-28 NOTE — Plan of Care (Signed)

## 2019-10-28 NOTE — Progress Notes (Signed)
Pt ambulated in a hallway without distress and chest pain. Vitals stable. IVABX continue, Mother called and updated, will continue to monitor the patient  Palma Holter, RN

## 2019-10-28 NOTE — Plan of Care (Signed)

## 2019-10-29 DIAGNOSIS — D500 Iron deficiency anemia secondary to blood loss (chronic): Secondary | ICD-10-CM | POA: Diagnosis present

## 2019-10-29 LAB — BASIC METABOLIC PANEL
Anion gap: 9 (ref 5–15)
BUN: 5 mg/dL — ABNORMAL LOW (ref 6–20)
CO2: 28 mmol/L (ref 22–32)
Calcium: 8.1 mg/dL — ABNORMAL LOW (ref 8.9–10.3)
Chloride: 103 mmol/L (ref 98–111)
Creatinine, Ser: 0.81 mg/dL (ref 0.44–1.00)
GFR calc Af Amer: >60 mL/min (ref >60)
GFR calc non Af Amer: >60 mL/min (ref >60)
Glucose, Bld: 135 mg/dL — ABNORMAL HIGH (ref 70–99)
Potassium: 4.2 mmol/L (ref 3.5–5.1)
Sodium: 140 mmol/L (ref 135–145)

## 2019-10-29 LAB — VITAMIN B12: Vitamin B-12: 390 pg/mL (ref 180–914)

## 2019-10-29 LAB — CBC WITH DIFFERENTIAL/PLATELET
Abs Immature Granulocytes: 0.02 10*3/uL (ref 0.00–0.07)
Basophils Absolute: 0 10*3/uL (ref 0.0–0.1)
Basophils Relative: 1 %
Eosinophils Absolute: 0.2 10*3/uL (ref 0.0–0.5)
Eosinophils Relative: 3 %
HCT: 29.1 % — ABNORMAL LOW (ref 36.0–46.0)
Hemoglobin: 9 g/dL — ABNORMAL LOW (ref 12.0–15.0)
Immature Granulocytes: 0 %
Lymphocytes Relative: 40 %
Lymphs Abs: 2.2 10*3/uL (ref 0.7–4.0)
MCH: 27.1 pg (ref 26.0–34.0)
MCHC: 30.9 g/dL (ref 30.0–36.0)
MCV: 87.7 fL (ref 80.0–100.0)
Monocytes Absolute: 0.3 10*3/uL (ref 0.1–1.0)
Monocytes Relative: 5 %
Neutro Abs: 2.8 10*3/uL (ref 1.7–7.7)
Neutrophils Relative %: 51 %
Platelets: 206 10*3/uL (ref 150–400)
RBC: 3.32 MIL/uL — ABNORMAL LOW (ref 3.87–5.11)
RDW: 15.6 % — ABNORMAL HIGH (ref 11.5–15.5)
WBC: 5.5 10*3/uL (ref 4.0–10.5)
nRBC: 0 % (ref 0.0–0.2)

## 2019-10-29 LAB — IRON AND TIBC
Iron: 47 ug/dL (ref 28–170)
Saturation Ratios: 22 % (ref 10.4–31.8)
TIBC: 214 ug/dL — ABNORMAL LOW (ref 250–450)
UIBC: 167 ug/dL

## 2019-10-29 LAB — CULTURE, BLOOD (ROUTINE X 2): Special Requests: ADEQUATE

## 2019-10-29 LAB — VANCOMYCIN, PEAK: Vancomycin Pk: 24 ug/mL — ABNORMAL LOW (ref 30–40)

## 2019-10-29 LAB — VANCOMYCIN, TROUGH: Vancomycin Tr: 9 ug/mL — ABNORMAL LOW (ref 15–20)

## 2019-10-29 LAB — FERRITIN: Ferritin: 147 ng/mL (ref 11–307)

## 2019-10-29 NOTE — H&P (View-Only) (Signed)
PROGRESS NOTE    [name]  ZOX:[id number] DOB: 02-03-1991 DOA: 10/25/2019 PCP: Ronnald NianLalonde, John C, MD    Brief Narrative:  28 year old female with history of depression, polysubstance abuse and injectable drug use presented on 10/22/2019 to the emergency room with fever and rash, left to go home, she felt more sick and came back to emergency room on 10/25/2019 with fever, tachycardia, blood pressure 88/61.  COVID-19 test was negative.  CT chest showed multiple focal cavitary lesions bilaterally.  Blood cultures MRSA.  She had generalized skin rashes that are improving. 10/27/2019, 1 unit of PRBC for hemoglobin of less than 7.   Assessment & Plan:   Principal Problem:   MRSA bacteremia Active Problems:   Cigarette smoker   Polysubstance (including opioids) dependence, daily use (HCC)   Sepsis (HCC)   Septic embolism (HCC)   Cavitary pneumonia   Vasculitis of skin   Injection of illicit drug within last 12 months   Iron deficiency anemia due to chronic blood loss  Sepsis present on admission, bilateral cavitary pneumonia, septic emboli and MRSA bacteremia and suspected endocarditis: Currently remains on vancomycin. Blood cultures, 10/26/2019 + for MRSA Blood cultures, 10/27/2019 no growth so far CT scan with bilateral cavitary pneumonia. Plan for TEE tomorrow on 10/30/2019. Clinically improving, afebrile, WBC count normalized. Skin rashes fading. Followed by infectious disease.  She will need prolonged IV antibiotics. Urine cultures with Streptococcus viridans.   significance unknown.  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  Started on Suboxone with good symptom control.  Also on symptomatic treatment with muscle relaxants and antispasmodics.  Smoker: On nicotine patch.  Iron deficiency anemia due to chronic blood loss: Has chronic anemia.  Probably iron deficiency.  Received 1 unit of PRBC on 10/27/2019.  Appropriately responded.  Will check iron profile  though she just received transfusion.  B12 and folate.  If iron profile low, she will benefit with IV iron while in the hospital.  No evidence of active bleeding.  Platelets are normal.   DVT prophylaxis: Lovenox subcu Code Status: Full code Family Communication: None Disposition Plan: Remains in the hospital   Consultants:   Infectious disease  Procedures:   None  Antimicrobials:   Vancomycin, 10/25/2019----   Subjective: Patient seen and examined.  Not keen to conversation.  No overnight events.  Nursing reported no issues.  She walked in the hallway with no desaturations as per nurses.  Objective: Vitals:   10/28/19 1124 10/28/19 1728 10/28/19 2000 10/29/19 0750  BP: 105/65 109/70 108/73 (!) 90/56  Pulse: 89 95 98 83  Resp:  18  17  Temp: 97.8 F (36.6 C) 98.3 F (36.8 C) 97.9 F (36.6 C) 97.7 F (36.5 C)  TempSrc: Oral Oral Oral Oral  SpO2: 100% 100%  100%  Weight:      Height:        Intake/Output Summary (Last 24 hours) at 10/29/2019 0756 Last data filed at 10/28/2019 2000 Gross per 24 hour  Intake 1137 ml  Output 1000 ml  Net 137 ml   Filed Weights   10/26/19 0652  Weight: 45.4 kg    Examination:  General exam: Appears calm and comfortable, irritable on interview. Respiratory system: Clear to auscultation. Respiratory effort normal. Cardiovascular system: S1 & S2 heard, RRR. No JVD, murmurs, rubs, gallops or clicks. No pedal edema. Gastrointestinal system: Abdomen is nondistended, soft and nontender. No organomegaly or masses felt. Normal bowel sounds heard. Central nervous system: Alert and oriented. No focal neurological  deficits. Extremities: Symmetric 5 x 5 power. Skin: Generalized macular rashes mostly fading as compared to previous pictures in epic. Psychiatry: Judgement and insight appear normal. Mood & affect flat.  Uninterested on conversation.    Data Reviewed: I have personally reviewed following labs and imaging studies  CBC:  Recent Labs  Lab 10/22/19 1634 10/25/19 1725 10/27/19 0658 10/28/19 0333  WBC 7.0 7.0 5.2 5.5  NEUTROABS 4.6 5.2  --  2.8  HGB 9.5* 7.7* 6.9* 8.5*  HCT 31.0* 25.6* 22.4* 27.0*  MCV 85.9 87.7 86.5 86.3  PLT 74* 111* 120* 154   Basic Metabolic Panel: Recent Labs  Lab 10/22/19 1634 10/25/19 1725 10/27/19 0658 10/28/19 0333  NA 129* 132* 139 139  K 4.0 4.0 4.0 3.9  CL 92* 94* 105 103  CO2 GLUCOSE 107* 101* 91 92  BUN 6 5* 5* 7  CREATININE 0.81 0.86 0.69 0.77  CALCIUM 8.4* 8.2* 7.9* 8.1*   GFR: Estimated Creatinine Clearance: 74.4 mL/min (by C-G formula based on SCr of 0.77 mg/dL). Liver Function Tests: Recent Labs  Lab 10/22/19 1634 10/25/19 1725  AST 22 20  ALT 16 15  ALKPHOS 60 53  BILITOT 0.4 0.6  PROT 7.9 7.2  ALBUMIN 2.2* 2.0*   No results for input(s): LIPASE, AMYLASE in the last 168 hours. No results for input(s): AMMONIA in the last 168 hours. Coagulation Profile: Recent Labs  Lab 10/22/19 1939  INR 1.0   Cardiac Enzymes: No results for input(s): CKTOTAL, CKMB, CKMBINDEX, TROPONINI in the last 168 hours. BNP (last 3 results) No results for input(s): PROBNP in the last 8760 hours. HbA1C: No results for input(s): HGBA1C in the last 72 hours. CBG: No results for input(s): GLUCAP in the last 168 hours. Lipid Profile: No results for input(s): CHOL, HDL, LDLCALC, TRIG, CHOLHDL, LDLDIRECT in the last 72 hours. Thyroid Function Tests: No results for input(s): TSH, T4TOTAL, FREET4, T3FREE, THYROIDAB in the last 72 hours. Anemia Panel: No results for input(s): VITAMINB12, FOLATE, FERRITIN, TIBC, IRON, RETICCTPCT in the last 72 hours. Sepsis Labs: Recent Labs  Lab 10/22/19 1634 10/25/19 1725 10/26/19 1045  PROCALCITON  --   --  <0.10  LATICACIDVEN 1.1 0.8  --     Recent Results (from the past 240 hour(s))  Blood Culture (routine x 2)     Status: None   Collection Time: 10/22/19  7:39 PM   Specimen: BLOOD  Result Value Ref Range  Status   Specimen Description BLOOD RIGHT ANTECUBITAL  Final   Special Requests   Final    BOTTLES DRAWN AEROBIC ONLY Blood Culture results may not be optimal due to an inadequate volume of blood received in culture bottles   Culture   Final    NO GROWTH 5 DAYS Performed at [facility], [address] [place], [place] [address]    Report Status 10/27/2019 FINAL  Final  Urine culture     Status: Abnormal   Collection Time: 10/22/19  9:10 PM   Specimen: In/Out Cath Urine  Result Value Ref Range Status   Specimen Description IN/OUT CATH URINE  Final   Special Requests   Final    NONE Performed at [facility], [address] [place], [place] [address]    Culture >=100,000 COLONIES/mL VIRIDANS STREPTOCOCCUS (A)  Final   Report Status 10/25/2019 FINAL  Final  Blood culture (routine x 2)     Status: Abnormal (Preliminary result)   Collection Time:  10/26/19  6:28 AM   Specimen: BLOOD RIGHT HAND  Result Value Ref Range Status   Specimen Description BLOOD RIGHT HAND  Final   Special Requests   Final    BOTTLES DRAWN AEROBIC AND ANAEROBIC Blood Culture results may not be optimal due to an inadequate volume of blood received in culture bottles   Culture  Setup Time   Final    IN BOTH AEROBIC AND ANAEROBIC BOTTLES GRAM POSITIVE COCCI CRITICAL RESULT CALLED TO, READ BACK BY AND VERIFIED WITH: L SEAY PHARMD 10/27/19 0118 JDW    Culture (A)  Final    STAPHYLOCOCCUS AUREUS SUSCEPTIBILITIES TO FOLLOW Performed at [facility], [address], [place], [place] [address]    Report Status PENDING  Incomplete  Blood Culture ID Panel (Reflexed)     Status: Abnormal   Collection Time: 10/26/19  6:28 AM  Result Value Ref Range Status   Enterococcus species NOT DETECTED NOT DETECTED Final   Listeria monocytogenes NOT DETECTED NOT DETECTED Final   Staphylococcus species DETECTED (A) NOT DETECTED Final    Comment: CRITICAL RESULT CALLED TO, READ BACK BY AND VERIFIED WITH: L SEAY  PHARMD 10/27/19 0118 JDW    Staphylococcus aureus (BCID) DETECTED (A) NOT DETECTED Final    Comment: Methicillin (oxacillin)-resistant Staphylococcus aureus (MRSA). MRSA is predictably resistant to beta-lactam antibiotics (except ceftaroline). Preferred therapy is vancomycin unless clinically contraindicated. Patient requires contact precautions if  hospitalized. CRITICAL RESULT CALLED TO, READ BACK BY AND VERIFIED WITH: L SEAY PHARM 10/27/19 0118 JDW    Methicillin resistance DETECTED (A) NOT DETECTED Final    Comment: CRITICAL RESULT CALLED TO, READ BACK BY AND VERIFIED WITH: L SEAY PHARMD 10/27/19 0118 JDW    Streptococcus species NOT DETECTED NOT DETECTED Final   Streptococcus agalactiae NOT DETECTED NOT DETECTED Final   Streptococcus pneumoniae NOT DETECTED NOT DETECTED Final   Streptococcus pyogenes NOT DETECTED NOT DETECTED Final   Acinetobacter baumannii NOT DETECTED NOT DETECTED Final   Enterobacteriaceae species NOT DETECTED NOT DETECTED Final   Enterobacter cloacae complex NOT DETECTED NOT DETECTED Final   Escherichia coli NOT DETECTED NOT DETECTED Final   Klebsiella oxytoca NOT DETECTED NOT DETECTED Final   Klebsiella pneumoniae NOT DETECTED NOT DETECTED Final   Proteus species NOT DETECTED NOT DETECTED Final   Serratia marcescens NOT DETECTED NOT DETECTED Final   Haemophilus influenzae NOT DETECTED NOT DETECTED Final   Neisseria meningitidis NOT DETECTED NOT DETECTED Final   Pseudomonas aeruginosa NOT DETECTED NOT DETECTED Final   Candida albicans NOT DETECTED NOT DETECTED Final   Candida glabrata NOT DETECTED NOT DETECTED Final   Candida krusei NOT DETECTED NOT DETECTED Final   Candida parapsilosis NOT DETECTED NOT DETECTED Final   Candida tropicalis NOT DETECTED NOT DETECTED Final    Comment: Performed at [facility], [address], [place], [place] [address]  Blood culture (routine x 2)     Status: Abnormal (Preliminary result)   Collection Time: 10/26/19   6:45 AM   Specimen: BLOOD RIGHT FOREARM  Result Value Ref Range Status   Specimen Description BLOOD RIGHT FOREARM  Final   Special Requests   Final    BOTTLES DRAWN AEROBIC AND ANAEROBIC Blood Culture adequate volume   Culture  Setup Time   Final    IN BOTH AEROBIC AND ANAEROBIC BOTTLES GRAM POSITIVE COCCI CRITICAL VALUE NOTED.  VALUE IS CONSISTENT WITH PREVIOUSLY REPORTED AND CALLED VALUE.    Culture (A)  Final  STAPHYLOCOCCUS AUREUS SUSCEPTIBILITIES TO FOLLOW Performed at [facility], [address], [place], [place] [address]    Report Status PENDING  Incomplete  SARS CORONAVIRUS 2 (TAT 6-24 HRS) Nasopharyngeal Nasopharyngeal Swab     Status: None   Collection Time: 10/26/19  6:55 AM   Specimen: Nasopharyngeal Swab  Result Value Ref Range Status   SARS Coronavirus 2 NEGATIVE NEGATIVE Final    Comment: (NOTE) SARS-CoV-2 target nucleic acids are NOT DETECTED. The SARS-CoV-2 RNA is generally detectable in upper and lower respiratory specimens during the acute phase of infection. Negative results do not preclude SARS-CoV-2 infection, do not rule out co-infections with other pathogens, and should not be used as the sole basis for treatment or other patient management decisions. Negative results must be combined with clinical observations, patient history, and epidemiological information. The expected result is Negative. Fact Sheet for Patients: HairSlick.no Fact Sheet for Healthcare Providers: quierodirigir.com This test is not yet approved or cleared by the [place] FDA and  has been authorized for detection and/or diagnosis of SARS-CoV-2 by FDA under an Emergency Use Authorization (EUA). This EUA will remain  in effect (meaning this test can be used) for the duration of the COVID-19 declaration under Section 56 4(b)(1) of the Act, 21 U.S.C. section 360bbb-3(b)(1), unless the authorization is terminated or  revoked sooner. Performed at [facility], [address] [place], [place] [address]   Urine culture     Status: None   Collection Time: 10/26/19  9:10 AM   Specimen: Urine, Random  Result Value Ref Range Status   Specimen Description URINE, RANDOM  Final   Special Requests SITE NOT SPECIFIED  Final   Culture   Final    NO GROWTH Performed at [facility], [address] [place], [place] [address]    Report Status 10/27/2019 FINAL  Final  Culture, blood (single)     Status: None (Preliminary result)   Collection Time: 10/26/19  7:19 PM   Specimen: BLOOD LEFT HAND  Result Value Ref Range Status   Specimen Description BLOOD LEFT HAND  Final   Special Requests   Final    BOTTLES DRAWN AEROBIC ONLY Blood Culture adequate volume   Culture   Final    NO GROWTH 3 DAYS Performed at [facility], [address], [place], [place] [address]    Report Status PENDING  Incomplete  Culture, blood (routine x 2)     Status: None (Preliminary result)   Collection Time: 10/27/19  8:35 PM   Specimen: BLOOD LEFT ARM  Result Value Ref Range Status   Specimen Description BLOOD LEFT ARM  Final   Special Requests   Final    BOTTLES DRAWN AEROBIC AND ANAEROBIC Blood Culture adequate volume   Culture  Setup Time   Final    AEROBIC BOTTLE ONLY GRAM POSITIVE COCCI CRITICAL VALUE NOTED.  VALUE IS CONSISTENT WITH PREVIOUSLY REPORTED AND CALLED VALUE.    Culture   Final    NO GROWTH 2 DAYS Performed at [facility], [address], [place], [place] [address]    Report Status PENDING  Incomplete  Culture, blood (routine x 2)     Status: None (Preliminary result)   Collection Time: 10/27/19  8:40 PM   Specimen: BLOOD LEFT HAND  Result Value Ref Range Status   Specimen Description BLOOD LEFT HAND  Final   Special Requests   Final    BOTTLES DRAWN AEROBIC  AND ANAEROBIC Blood Culture adequate volume   Culture   Final    NO GROWTH 2 DAYS Performed at [facility],  [place] [address], [place],  [address]    Report Status PENDING  Incomplete         Radiology Studies: No results found.      Scheduled Meds: . sodium chloride   Intravenous Once  . buprenorphine-naloxone  2 tablet Sublingual BID  . docusate sodium  100 mg Oral BID  . enoxaparin (LOVENOX) injection  40 mg Subcutaneous Q24H  . nicotine  14 mg Transdermal Daily  . sodium chloride flush  3 mL Intravenous Q12H   Continuous Infusions: . vancomycin 500 mg (10/29/19 0715)     LOS: 3 days    Time spent: 25 minutes     Barb Merino, MD [facility] Pager [phone]

## 2019-10-29 NOTE — Progress Notes (Signed)
PROGRESS NOTE    Holly RoanCarey M [name]  ZOX:[id number] DOB: 02-03-1991 DOA: 10/25/2019 PCP: Ronnald NianLalonde, John C, MD    Brief Narrative:  28 year old female with history of depression, polysubstance abuse and injectable drug use presented on 10/22/2019 to the emergency room with fever and rash, left to go home, she felt more sick and came back to emergency room on 10/25/2019 with fever, tachycardia, blood pressure 88/61.  COVID-19 test was negative.  CT chest showed multiple focal cavitary lesions bilaterally.  Blood cultures MRSA.  She had generalized skin rashes that are improving. 10/27/2019, 1 unit of PRBC for hemoglobin of less than 7.   Assessment & Plan:   Principal Problem:   MRSA bacteremia Active Problems:   Cigarette smoker   Polysubstance (including opioids) dependence, daily use (HCC)   Sepsis (HCC)   Septic embolism (HCC)   Cavitary pneumonia   Vasculitis of skin   Injection of illicit drug within last 12 months   Iron deficiency anemia due to chronic blood loss  Sepsis present on admission, bilateral cavitary pneumonia, septic emboli and MRSA bacteremia and suspected endocarditis: Currently remains on vancomycin. Blood cultures, 10/26/2019 + for MRSA Blood cultures, 10/27/2019 no growth so far CT scan with bilateral cavitary pneumonia. Plan for TEE tomorrow on 10/30/2019. Clinically improving, afebrile, WBC count normalized. Skin rashes fading. Followed by infectious disease.  She will need prolonged IV antibiotics. Urine cultures with Streptococcus viridans.   significance unknown.  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  Started on Suboxone with good symptom control.  Also on symptomatic treatment with muscle relaxants and antispasmodics.  Smoker: On nicotine patch.  Iron deficiency anemia due to chronic blood loss: Has chronic anemia.  Probably iron deficiency.  Received 1 unit of PRBC on 10/27/2019.  Appropriately responded.  Will check iron profile  though she just received transfusion.  B12 and folate.  If iron profile low, she will benefit with IV iron while in the hospital.  No evidence of active bleeding.  Platelets are normal.   DVT prophylaxis: Lovenox subcu Code Status: Full code Family Communication: None Disposition Plan: Remains in the hospital   Consultants:   Infectious disease  Procedures:   None  Antimicrobials:   Vancomycin, 10/25/2019----   Subjective: Patient seen and examined.  Not keen to conversation.  No overnight events.  Nursing reported no issues.  She walked in the hallway with no desaturations as per nurses.  Objective: Vitals:   10/28/19 1124 10/28/19 1728 10/28/19 2000 10/29/19 0750  BP: 105/65 109/70 108/73 (!) 90/56  Pulse: 89 95 98 83  Resp:  18  17  Temp: 97.8 F (36.6 C) 98.3 F (36.8 C) 97.9 F (36.6 C) 97.7 F (36.5 C)  TempSrc: Oral Oral Oral Oral  SpO2: 100% 100%  100%  Weight:      Height:        Intake/Output Summary (Last 24 hours) at 10/29/2019 0756 Last data filed at 10/28/2019 2000 Gross per 24 hour  Intake 1137 ml  Output 1000 ml  Net 137 ml   Filed Weights   10/26/19 0652  Weight: 45.4 kg    Examination:  General exam: Appears calm and comfortable, irritable on interview. Respiratory system: Clear to auscultation. Respiratory effort normal. Cardiovascular system: S1 & S2 heard, RRR. No JVD, murmurs, rubs, gallops or clicks. No pedal edema. Gastrointestinal system: Abdomen is nondistended, soft and nontender. No organomegaly or masses felt. Normal bowel sounds heard. Central nervous system: Alert and oriented. No focal neurological  deficits. Extremities: Symmetric 5 x 5 power. Skin: Generalized macular rashes mostly fading as compared to previous pictures in epic. Psychiatry: Judgement and insight appear normal. Mood & affect flat.  Uninterested on conversation.    Data Reviewed: I have personally reviewed following labs and imaging studies  CBC:  Recent Labs  Lab 10/22/19 1634 10/25/19 1725 10/27/19 0658 10/28/19 0333  WBC 7.0 7.0 5.2 5.5  NEUTROABS 4.6 5.2  --  2.8  HGB 9.5* 7.7* 6.9* 8.5*  HCT 31.0* 25.6* 22.4* 27.0*  MCV 85.9 87.7 86.5 86.3  PLT 74* 111* 120* 154   Basic Metabolic Panel: Recent Labs  Lab 10/22/19 1634 10/25/19 1725 10/27/19 0658 10/28/19 0333  NA 129* 132* 139 139  K 4.0 4.0 4.0 3.9  CL 92* 94* 105 103  CO2 26 27 28 26  GLUCOSE 107* 101* 91 92  BUN 6 5* 5* 7  CREATININE 0.81 0.86 0.69 0.77  CALCIUM 8.4* 8.2* 7.9* 8.1*   GFR: Estimated Creatinine Clearance: 74.4 mL/min (by C-G formula based on SCr of 0.77 mg/dL). Liver Function Tests: Recent Labs  Lab 10/22/19 1634 10/25/19 1725  AST 22 20  ALT 16 15  ALKPHOS 60 53  BILITOT 0.4 0.6  PROT 7.9 7.2  ALBUMIN 2.2* 2.0*   No results for input(s): LIPASE, AMYLASE in the last 168 hours. No results for input(s): AMMONIA in the last 168 hours. Coagulation Profile: Recent Labs  Lab 10/22/19 1939  INR 1.0   Cardiac Enzymes: No results for input(s): CKTOTAL, CKMB, CKMBINDEX, TROPONINI in the last 168 hours. BNP (last 3 results) No results for input(s): PROBNP in the last 8760 hours. HbA1C: No results for input(s): HGBA1C in the last 72 hours. CBG: No results for input(s): GLUCAP in the last 168 hours. Lipid Profile: No results for input(s): CHOL, HDL, LDLCALC, TRIG, CHOLHDL, LDLDIRECT in the last 72 hours. Thyroid Function Tests: No results for input(s): TSH, T4TOTAL, FREET4, T3FREE, THYROIDAB in the last 72 hours. Anemia Panel: No results for input(s): VITAMINB12, FOLATE, FERRITIN, TIBC, IRON, RETICCTPCT in the last 72 hours. Sepsis Labs: Recent Labs  Lab 10/22/19 1634 10/25/19 1725 10/26/19 1045  PROCALCITON  --   --  <0.10  LATICACIDVEN 1.1 0.8  --     Recent Results (from the past 240 hour(s))  Blood Culture (routine x 2)     Status: None   Collection Time: 10/22/19  7:39 PM   Specimen: BLOOD  Result Value Ref Range  Status   Specimen Description BLOOD RIGHT ANTECUBITAL  Final   Special Requests   Final    BOTTLES DRAWN AEROBIC ONLY Blood Culture results may not be optimal due to an inadequate volume of blood received in culture bottles   Culture   Final    NO GROWTH 5 DAYS Performed at [facility], [address] [place], [place] [address]    Report Status 10/27/2019 FINAL  Final  Urine culture     Status: Abnormal   Collection Time: 10/22/19  9:10 PM   Specimen: In/Out Cath Urine  Result Value Ref Range Status   Specimen Description IN/OUT CATH URINE  Final   Special Requests   Final    NONE Performed at [facility], [address] [place], [place] [address]    Culture >=100,000 COLONIES/mL VIRIDANS STREPTOCOCCUS (A)  Final   Report Status 10/25/2019 FINAL  Final  Blood culture (routine x 2)     Status: Abnormal (Preliminary result)   Collection Time:   10/26/19  6:28 AM   Specimen: BLOOD RIGHT HAND  Result Value Ref Range Status   Specimen Description BLOOD RIGHT HAND  Final   Special Requests   Final    BOTTLES DRAWN AEROBIC AND ANAEROBIC Blood Culture results may not be optimal due to an inadequate volume of blood received in culture bottles   Culture  Setup Time   Final    IN BOTH AEROBIC AND ANAEROBIC BOTTLES GRAM POSITIVE COCCI CRITICAL RESULT CALLED TO, READ BACK BY AND VERIFIED WITH: L SEAY PHARMD 10/27/19 0118 JDW    Culture (A)  Final    STAPHYLOCOCCUS AUREUS SUSCEPTIBILITIES TO FOLLOW Performed at [facility], [address], [place], [place] [address]    Report Status PENDING  Incomplete  Blood Culture ID Panel (Reflexed)     Status: Abnormal   Collection Time: 10/26/19  6:28 AM  Result Value Ref Range Status   Enterococcus species NOT DETECTED NOT DETECTED Final   Listeria monocytogenes NOT DETECTED NOT DETECTED Final   Staphylococcus species DETECTED (A) NOT DETECTED Final    Comment: CRITICAL RESULT CALLED TO, READ BACK BY AND VERIFIED WITH: L SEAY  PHARMD 10/27/19 0118 JDW    Staphylococcus aureus (BCID) DETECTED (A) NOT DETECTED Final    Comment: Methicillin (oxacillin)-resistant Staphylococcus aureus (MRSA). MRSA is predictably resistant to beta-lactam antibiotics (except ceftaroline). Preferred therapy is vancomycin unless clinically contraindicated. Patient requires contact precautions if  hospitalized. CRITICAL RESULT CALLED TO, READ BACK BY AND VERIFIED WITH: L SEAY PHARM 10/27/19 0118 JDW    Methicillin resistance DETECTED (A) NOT DETECTED Final    Comment: CRITICAL RESULT CALLED TO, READ BACK BY AND VERIFIED WITH: L SEAY PHARMD 10/27/19 0118 JDW    Streptococcus species NOT DETECTED NOT DETECTED Final   Streptococcus agalactiae NOT DETECTED NOT DETECTED Final   Streptococcus pneumoniae NOT DETECTED NOT DETECTED Final   Streptococcus pyogenes NOT DETECTED NOT DETECTED Final   Acinetobacter baumannii NOT DETECTED NOT DETECTED Final   Enterobacteriaceae species NOT DETECTED NOT DETECTED Final   Enterobacter cloacae complex NOT DETECTED NOT DETECTED Final   Escherichia coli NOT DETECTED NOT DETECTED Final   Klebsiella oxytoca NOT DETECTED NOT DETECTED Final   Klebsiella pneumoniae NOT DETECTED NOT DETECTED Final   Proteus species NOT DETECTED NOT DETECTED Final   Serratia marcescens NOT DETECTED NOT DETECTED Final   Haemophilus influenzae NOT DETECTED NOT DETECTED Final   Neisseria meningitidis NOT DETECTED NOT DETECTED Final   Pseudomonas aeruginosa NOT DETECTED NOT DETECTED Final   Candida albicans NOT DETECTED NOT DETECTED Final   Candida glabrata NOT DETECTED NOT DETECTED Final   Candida krusei NOT DETECTED NOT DETECTED Final   Candida parapsilosis NOT DETECTED NOT DETECTED Final   Candida tropicalis NOT DETECTED NOT DETECTED Final    Comment: Performed at [facility], [address], [place], [place] [address]  Blood culture (routine x 2)     Status: Abnormal (Preliminary result)   Collection Time: 10/26/19   6:45 AM   Specimen: BLOOD RIGHT FOREARM  Result Value Ref Range Status   Specimen Description BLOOD RIGHT FOREARM  Final   Special Requests   Final    BOTTLES DRAWN AEROBIC AND ANAEROBIC Blood Culture adequate volume   Culture  Setup Time   Final    IN BOTH AEROBIC AND ANAEROBIC BOTTLES GRAM POSITIVE COCCI CRITICAL VALUE NOTED.  VALUE IS CONSISTENT WITH PREVIOUSLY REPORTED AND CALLED VALUE.    Culture (A)  Final  STAPHYLOCOCCUS AUREUS SUSCEPTIBILITIES TO FOLLOW Performed at [facility], [address], [place], [place] [address]    Report Status PENDING  Incomplete  SARS CORONAVIRUS 2 (TAT 6-24 HRS) Nasopharyngeal Nasopharyngeal Swab     Status: None   Collection Time: 10/26/19  6:55 AM   Specimen: Nasopharyngeal Swab  Result Value Ref Range Status   SARS Coronavirus 2 NEGATIVE NEGATIVE Final    Comment: (NOTE) SARS-CoV-2 target nucleic acids are NOT DETECTED. The SARS-CoV-2 RNA is generally detectable in upper and lower respiratory specimens during the acute phase of infection. Negative results do not preclude SARS-CoV-2 infection, do not rule out co-infections with other pathogens, and should not be used as the sole basis for treatment or other patient management decisions. Negative results must be combined with clinical observations, patient history, and epidemiological information. The expected result is Negative. Fact Sheet for Patients: HairSlick.no Fact Sheet for Healthcare Providers: quierodirigir.com This test is not yet approved or cleared by the [place] FDA and  has been authorized for detection and/or diagnosis of SARS-CoV-2 by FDA under an Emergency Use Authorization (EUA). This EUA will remain  in effect (meaning this test can be used) for the duration of the COVID-19 declaration under Section 56 4(b)(1) of the Act, 21 U.S.C. section 360bbb-3(b)(1), unless the authorization is terminated or  revoked sooner. Performed at [facility], [address] [place], [place] [address]   Urine culture     Status: None   Collection Time: 10/26/19  9:10 AM   Specimen: Urine, Random  Result Value Ref Range Status   Specimen Description URINE, RANDOM  Final   Special Requests SITE NOT SPECIFIED  Final   Culture   Final    NO GROWTH Performed at [facility], [address] [place], [place] [address]    Report Status 10/27/2019 FINAL  Final  Culture, blood (single)     Status: None (Preliminary result)   Collection Time: 10/26/19  7:19 PM   Specimen: BLOOD LEFT HAND  Result Value Ref Range Status   Specimen Description BLOOD LEFT HAND  Final   Special Requests   Final    BOTTLES DRAWN AEROBIC ONLY Blood Culture adequate volume   Culture   Final    NO GROWTH 3 DAYS Performed at [facility], [address], [place], [place] [address]    Report Status PENDING  Incomplete  Culture, blood (routine x 2)     Status: None (Preliminary result)   Collection Time: 10/27/19  8:35 PM   Specimen: BLOOD LEFT ARM  Result Value Ref Range Status   Specimen Description BLOOD LEFT ARM  Final   Special Requests   Final    BOTTLES DRAWN AEROBIC AND ANAEROBIC Blood Culture adequate volume   Culture  Setup Time   Final    AEROBIC BOTTLE ONLY GRAM POSITIVE COCCI CRITICAL VALUE NOTED.  VALUE IS CONSISTENT WITH PREVIOUSLY REPORTED AND CALLED VALUE.    Culture   Final    NO GROWTH 2 DAYS Performed at [facility], [address], [place], [place] [address]    Report Status PENDING  Incomplete  Culture, blood (routine x 2)     Status: None (Preliminary result)   Collection Time: 10/27/19  8:40 PM   Specimen: BLOOD LEFT HAND  Result Value Ref Range Status   Specimen Description BLOOD LEFT HAND  Final   Special Requests   Final    BOTTLES DRAWN AEROBIC  AND ANAEROBIC Blood Culture adequate volume   Culture   Final    NO GROWTH 2 DAYS Performed at [facility],  [place] [address], [place],  [address]    Report Status PENDING  Incomplete         Radiology Studies: No results found.      Scheduled Meds: . sodium chloride   Intravenous Once  . buprenorphine-naloxone  2 tablet Sublingual BID  . docusate sodium  100 mg Oral BID  . enoxaparin (LOVENOX) injection  40 mg Subcutaneous Q24H  . nicotine  14 mg Transdermal Daily  . sodium chloride flush  3 mL Intravenous Q12H   Continuous Infusions: . vancomycin 500 mg (10/29/19 0715)     LOS: 3 days    Time spent: 25 minutes     Barb Merino, MD [facility] Pager [phone]

## 2019-10-29 NOTE — Anesthesia Preprocedure Evaluation (Addendum)
Anesthesia Evaluation  Patient identified by MRN, date of birth, ID band Patient awake    Reviewed: Allergy & Precautions, NPO status , Patient's Chart, lab work & pertinent test results  History of Anesthesia Complications Negative for: history of anesthetic complications  Airway Mallampati: II  TM Distance: >3 FB Neck ROM: Full    Dental no notable dental hx.    Pulmonary pneumonia, Current Smoker and Patient abstained from smoking.,    Pulmonary exam normal        Cardiovascular negative cardio ROS Normal cardiovascular exam     Neuro/Psych PSYCHIATRIC DISORDERS Depression negative neurological ROS     GI/Hepatic negative GI ROS, (+)     substance abuse  IV drug use, Hepatitis -, C  Endo/Other  negative endocrine ROS  Renal/GU negative Renal ROS     Musculoskeletal negative musculoskeletal ROS (+)   Abdominal   Peds  Hematology negative hematology ROS (+)   Anesthesia Other Findings Day of surgery medications reviewed with the patient.  Reproductive/Obstetrics                           Anesthesia Physical Anesthesia Plan  ASA: III  Anesthesia Plan: MAC   Post-op Pain Management:    Induction: Intravenous  PONV Risk Score and Plan: Ondansetron and Propofol infusion  Airway Management Planned: Natural Airway  Additional Equipment:   Intra-op Plan:   Post-operative Plan:   Informed Consent: I have reviewed the patients History and Physical, chart, labs and discussed the procedure including the risks, benefits and alternatives for the proposed anesthesia with the patient or authorized representative who has indicated his/her understanding and acceptance.     Dental advisory given  Plan Discussed with: CRNA and Anesthesiologist  Anesthesia Plan Comments:        Anesthesia Quick Evaluation

## 2019-10-29 NOTE — Progress Notes (Signed)
Pharmacy Antibiotic Note  [name] is a 28 y.o. female admitted on 10/25/2019 with fever and rash.  Pharmacy has been consulted for vancomycin dosing for cavitary PNA with suspected septic emboli and concern for endocarditis.  Renal function is stable and vancomycin AUC is therapeutic.  Plan: Continue vanc 500mg  IV Q12H Monitor renal fxn, clinical progress, weekly AUC  Height: 5' (152.4 cm) Weight: 100 lb (45.4 kg) IBW/kg (Calculated) : 45.5  Temp (24hrs), Avg:98.2 F (36.8 C), Min:97.7 F (36.5 C), Max:98.7 F (37.1 C)  Recent Labs  Lab 10/25/19 1725 10/27/19 0658 10/28/19 0333 10/28/19 2157 10/29/19 0937 10/29/19 1811  WBC 7.0 5.2 5.5  --  5.5  --   CREATININE 0.86 0.69 0.77  --  0.81  --   LATICACIDVEN 0.8  --   --   --   --   --   VANCOTROUGH  --   --   --   --   --  9*  VANCOPEAK  --   --   --  19* 24*  --     Estimated Creatinine Clearance: 73.4 mL/min (by C-G formula based on SCr of 0.81 mg/dL).    Allergies  Allergen Reactions  . Dextromethorphan Swelling   Vanc 10/29 >> Cefepime 10/29 >>10/30  11/1 VP/VT 24/9, AUC = 399, no change, will accumulate  10/25 UCx >> Viridans Strep 10/29 BCx >>MRSA 10/29 UCx >>ngF 10/29 COVID >>neg 10/30 BCx >> ngtd  [name] D. Mina Marble, PharmD, BCPS, [place] 10/29/2019, 9:41 PM

## 2019-10-30 ENCOUNTER — Inpatient Hospital Stay (HOSPITAL_COMMUNITY): Payer: Medicaid Other

## 2019-10-30 ENCOUNTER — Inpatient Hospital Stay (HOSPITAL_COMMUNITY): Payer: Medicaid Other | Admitting: Anesthesiology

## 2019-10-30 ENCOUNTER — Encounter (HOSPITAL_COMMUNITY): Payer: Self-pay | Admitting: Certified Registered Nurse Anesthetist

## 2019-10-30 ENCOUNTER — Encounter (HOSPITAL_COMMUNITY)
Admission: EM | Discharge status: Against Medical Advice | Payer: Self-pay | Source: Home / Self Care | Attending: Internal Medicine

## 2019-10-30 DIAGNOSIS — R768 Other specified abnormal immunological findings in serum: Secondary | ICD-10-CM

## 2019-10-30 DIAGNOSIS — F1120 Opioid dependence, uncomplicated: Secondary | ICD-10-CM

## 2019-10-30 DIAGNOSIS — I361 Nonrheumatic tricuspid (valve) insufficiency: Secondary | ICD-10-CM

## 2019-10-30 DIAGNOSIS — Z888 Allergy status to other drugs, medicaments and biological substances status: Secondary | ICD-10-CM

## 2019-10-30 DIAGNOSIS — L959 Vasculitis limited to the skin, unspecified: Secondary | ICD-10-CM

## 2019-10-30 DIAGNOSIS — F329 Major depressive disorder, single episode, unspecified: Secondary | ICD-10-CM

## 2019-10-30 DIAGNOSIS — I330 Acute and subacute infective endocarditis: Secondary | ICD-10-CM

## 2019-10-30 DIAGNOSIS — I340 Nonrheumatic mitral (valve) insufficiency: Secondary | ICD-10-CM

## 2019-10-30 DIAGNOSIS — R7881 Bacteremia: Secondary | ICD-10-CM

## 2019-10-30 DIAGNOSIS — B9562 Methicillin resistant Staphylococcus aureus infection as the cause of diseases classified elsewhere: Secondary | ICD-10-CM

## 2019-10-30 HISTORY — PX: BUBBLE STUDY: SHX6837

## 2019-10-30 HISTORY — PX: TEE WITHOUT CARDIOVERSION: SHX5443

## 2019-10-30 LAB — FOLATE RBC
Folate, Hemolysate: 318 ng/mL
Folate, RBC: 1152 ng/mL (ref >498)
Hematocrit: 27.6 % — ABNORMAL LOW (ref 34.0–46.6)

## 2019-10-30 LAB — T.PALLIDUM AB, TOTAL: T Pallidum Abs: NONREACTIVE

## 2019-10-30 SURGERY — ECHOCARDIOGRAM, TRANSESOPHAGEAL
Anesthesia: Monitor Anesthesia Care

## 2019-10-30 MED ORDER — PROPOFOL 500 MG/50ML IV EMUL
INTRAVENOUS | Status: DC | PRN
  Administered 2019-10-30: 100 ug/kg/min via INTRAVENOUS

## 2019-10-30 MED ORDER — LIDOCAINE 2% (20 MG/ML) 5 ML SYRINGE
INTRAMUSCULAR | Status: DC | PRN
  Administered 2019-10-30: 40 mg via INTRAVENOUS

## 2019-10-30 MED ORDER — ONDANSETRON HCL 4 MG/2ML IJ SOLN
INTRAMUSCULAR | Status: DC | PRN
  Administered 2019-10-30: 4 mg via INTRAVENOUS

## 2019-10-30 MED ORDER — PROPOFOL 10 MG/ML IV BOLUS
INTRAVENOUS | Status: DC | PRN
  Administered 2019-10-30 (×4): 20 mg via INTRAVENOUS

## 2019-10-30 MED ORDER — SODIUM CHLORIDE 0.9 % IV SOLN
INTRAVENOUS | Status: DC
  Administered 2019-10-30: 08:00:00 via INTRAVENOUS

## 2019-10-30 MED ORDER — LINEZOLID 600 MG PO TABS: 600.0000 mg | ORAL_TABLET | Freq: Two times a day (BID) | ORAL | 0 refills | Status: AC

## 2019-10-30 NOTE — Progress Notes (Signed)
PROGRESS NOTE    [name]  XLK:[id number] DOB: Dec 03, 1991 DOA: 10/25/2019 PCP: Ronnald Nian, MD    Brief Narrative:  28 year old female with history of depression, polysubstance abuse and injectable drug use presented on 10/22/2019 to the emergency room with fever and rash, left to go home, she felt more sick and came back to emergency room on 10/25/2019 with fever, tachycardia, blood pressure 88/61.  COVID-19 test was negative.  CT chest showed multiple focal cavitary lesions bilaterally.  Blood cultures MRSA.  She had generalized skin rashes that are improving. 10/27/2019, 1 unit of PRBC for hemoglobin of less than 7.   Assessment & Plan:   Principal Problem:   MRSA bacteremia Active Problems:   Cigarette smoker   Polysubstance (including opioids) dependence, daily use (HCC)   Sepsis (HCC)   Septic embolism (HCC)   Cavitary pneumonia   Vasculitis of skin   Injection of illicit drug within last 12 months   Iron deficiency anemia due to chronic blood loss  Sepsis present on admission, bilateral cavitary pneumonia, septic emboli and MRSA bacteremia and suspected endocarditis: Currently remains on vancomycin. Blood cultures, 10/26/2019 + for MRSA Blood cultures, 10/27/2019 positive for MRSA Blood cultures, 10/30/2019, drawn CT scan with bilateral cavitary pneumonia. 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 afebrile, WBC count normalized. Skin rashes fading. Followed by infectious disease.  She will need prolonged IV antibiotics. Urine cultures with Streptococcus viridans.   significance unknown. Patient would likely not agree for surgery, will consult CT CS for evaluation.  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  Started on Suboxone with good symptom control.  Also on symptomatic treatment with muscle relaxants and antispasmodics.  Smoker: On nicotine patch.  Iron deficiency anemia due to chronic blood loss: Has chronic anemia.  Probably  iron deficiency.  Received 1 unit of PRBC on 10/27/2019.  Appropriately responded.  Iron and B12 levels are adequate.  DVT prophylaxis: Lovenox subcu Code Status: Full code Family Communication: None Disposition Plan: Remains in the hospital   Consultants:   Infectious disease  Procedures:   None  Antimicrobials:   Vancomycin, 10/25/2019----   Subjective: Patient seen and examined.  Not keen to conversation.  Tearful.  Not wanting to discuss plan of care.  Unsure she will stay in the hospital to complete treatment. Objective: Vitals:   10/30/19 0559 10/30/19 0718 10/30/19 0830 10/30/19 0843  BP: 102/75 120/83 110/68 109/73  Pulse: 85 88  81  Resp: Temp: 97.9 F (36.6 C) 98.4 F (36.9 C) (!) 97.4 F (36.3 C)   TempSrc: Oral Temporal    SpO2: 100% 98% 98% 100%  Weight:      Height:        Intake/Output Summary (Last 24 hours) at 10/30/2019 1125 Last data filed at 10/30/2019 0825 Gross per 24 hour  Intake 1040 ml  Output 700 ml  Net 340 ml   Filed Weights   10/26/19 0652  Weight: 45.4 kg    Examination:  General exam: Appears calm and comfortable, irritable on interview.  Tearful. Respiratory system: Clear to auscultation. Respiratory effort normal. Cardiovascular system: S1 & S2 heard, RRR. No JVD, murmurs, rubs, gallops or clicks. No pedal edema. Gastrointestinal system: Abdomen is nondistended, soft and nontender. No organomegaly or masses felt. Normal bowel sounds heard. Central nervous system: Alert and oriented. No focal neurological deficits. Extremities: Symmetric 5 x 5 power. Skin: Generalized macular rashes mostly fading as compared to previous pictures in epic.  Psychiatry: Judgement and insight appear normal. Mood & affect flat.  Uninterested on conversation.    Data Reviewed: I have personally reviewed following labs and imaging studies  CBC: Recent Labs  Lab 10/25/19 1725 10/27/19 0658 10/28/19 0333 10/29/19 0937  WBC 7.0  5.2 5.5 5.5  NEUTROABS 5.2  --  2.8 2.8  HGB 7.7* 6.9* 8.5* 9.0*  HCT 25.6* 22.4* 27.0* 29.1*  MCV 87.7 86.5 86.3 87.7  PLT 111* 120* 154 206   Basic Metabolic Panel: Recent Labs  Lab 10/25/19 1725 10/27/19 0658 10/28/19 0333 10/29/19 0937  NA 132* 139 139 140  K 4.0 4.0 3.9 4.2  CL 94* 105 103 103  CO2 GLUCOSE 101* 91 92 135*  BUN 5* 5* 7 5*  CREATININE 0.86 0.69 0.77 0.81  CALCIUM 8.2* 7.9* 8.1* 8.1*   GFR: Estimated Creatinine Clearance: 73.4 mL/min (by C-G formula based on SCr of 0.81 mg/dL). Liver Function Tests: Recent Labs  Lab 10/25/19 1725  AST 20  ALT 15  ALKPHOS 53  BILITOT 0.6  PROT 7.2  ALBUMIN 2.0*   No results for input(s): LIPASE, AMYLASE in the last 168 hours. No results for input(s): AMMONIA in the last 168 hours. Coagulation Profile: No results for input(s): INR, PROTIME in the last 168 hours. Cardiac Enzymes: No results for input(s): CKTOTAL, CKMB, CKMBINDEX, TROPONINI in the last 168 hours. BNP (last 3 results) No results for input(s): PROBNP in the last 8760 hours. HbA1C: No results for input(s): HGBA1C in the last 72 hours. CBG: No results for input(s): GLUCAP in the last 168 hours. Lipid Profile: No results for input(s): CHOL, HDL, LDLCALC, TRIG, CHOLHDL, LDLDIRECT in the last 72 hours. Thyroid Function Tests: No results for input(s): TSH, T4TOTAL, FREET4, T3FREE, THYROIDAB in the last 72 hours. Anemia Panel: Recent Labs    10/29/19 0937  VITAMINB12 390  FERRITIN 147  TIBC 214*  IRON 47   Sepsis Labs: Recent Labs  Lab 10/25/19 1725 10/26/19 1045  PROCALCITON  --  <0.10  LATICACIDVEN 0.8  --     Recent Results (from the past 240 hour(s))  Blood Culture (routine x 2)     Status: None   Collection Time: 10/22/19  7:39 PM   Specimen: BLOOD  Result Value Ref Range Status   Specimen Description BLOOD RIGHT ANTECUBITAL  Final   Special Requests   Final    BOTTLES DRAWN AEROBIC ONLY Blood Culture results may not  be optimal due to an inadequate volume of blood received in culture bottles   Culture   Final    NO GROWTH 5 DAYS Performed at [facility], [address] [place], [place] [address]    Report Status 10/27/2019 FINAL  Final  Urine culture     Status: Abnormal   Collection Time: 10/22/19  9:10 PM   Specimen: In/Out Cath Urine  Result Value Ref Range Status   Specimen Description IN/OUT CATH URINE  Final   Special Requests   Final    NONE Performed at [facility], [address], [place], [place] [address]    Culture >=100,000 COLONIES/mL VIRIDANS STREPTOCOCCUS (A)  Final   Report Status 10/25/2019 FINAL  Final  Blood culture (routine x 2)     Status: Abnormal   Collection Time: 10/26/19  6:28 AM   Specimen: BLOOD RIGHT HAND  Result Value Ref Range Status   Specimen Description BLOOD RIGHT HAND  Final   Special Requests  Final    BOTTLES DRAWN AEROBIC AND ANAEROBIC Blood Culture results may not be optimal due to an inadequate volume of blood received in culture bottles   Culture  Setup Time   Final    IN BOTH AEROBIC AND ANAEROBIC BOTTLES GRAM POSITIVE COCCI CRITICAL RESULT CALLED TO, READ BACK BY AND VERIFIED WITH: Tori MilksL SEAY PHARMD 10/27/19 0118 JDW Performed at [facility], [address] [place], [place] [address]    Culture METHICILLIN RESISTANT STAPHYLOCOCCUS AUREUS (A)  Final   Report Status 10/29/2019 FINAL  Final   Organism ID, Bacteria METHICILLIN RESISTANT STAPHYLOCOCCUS AUREUS  Final      Susceptibility   Methicillin resistant staphylococcus aureus - MIC*    CIPROFLOXACIN >=8 RESISTANT Resistant     ERYTHROMYCIN >=8 RESISTANT Resistant     GENTAMICIN <=0.5 SENSITIVE Sensitive     OXACILLIN >=4 RESISTANT Resistant     TETRACYCLINE <=1 SENSITIVE Sensitive     VANCOMYCIN 1 SENSITIVE Sensitive     TRIMETH/SULFA <=10 SENSITIVE Sensitive     CLINDAMYCIN <=0.25 SENSITIVE Sensitive     RIFAMPIN <=0.5 SENSITIVE Sensitive     Inducible Clindamycin  NEGATIVE Sensitive     * METHICILLIN RESISTANT STAPHYLOCOCCUS AUREUS  Blood Culture ID Panel (Reflexed)     Status: Abnormal   Collection Time: 10/26/19  6:28 AM  Result Value Ref Range Status   Enterococcus species NOT DETECTED NOT DETECTED Final   Listeria monocytogenes NOT DETECTED NOT DETECTED Final   Staphylococcus species DETECTED (A) NOT DETECTED Final    Comment: CRITICAL RESULT CALLED TO, READ BACK BY AND VERIFIED WITH: L SEAY PHARMD 10/27/19 0118 JDW    Staphylococcus aureus (BCID) DETECTED (A) NOT DETECTED Final    Comment: Methicillin (oxacillin)-resistant Staphylococcus aureus (MRSA). MRSA is predictably resistant to beta-lactam antibiotics (except ceftaroline). Preferred therapy is vancomycin unless clinically contraindicated. Patient requires contact precautions if  hospitalized. CRITICAL RESULT CALLED TO, READ BACK BY AND VERIFIED WITH: L SEAY PHARM 10/27/19 0118 JDW    Methicillin resistance DETECTED (A) NOT DETECTED Final    Comment: CRITICAL RESULT CALLED TO, READ BACK BY AND VERIFIED WITH: L SEAY PHARMD 10/27/19 0118 JDW    Streptococcus species NOT DETECTED NOT DETECTED Final   Streptococcus agalactiae NOT DETECTED NOT DETECTED Final   Streptococcus pneumoniae NOT DETECTED NOT DETECTED Final   Streptococcus pyogenes NOT DETECTED NOT DETECTED Final   Acinetobacter baumannii NOT DETECTED NOT DETECTED Final   Enterobacteriaceae species NOT DETECTED NOT DETECTED Final   Enterobacter cloacae complex NOT DETECTED NOT DETECTED Final   Escherichia coli NOT DETECTED NOT DETECTED Final   Klebsiella oxytoca NOT DETECTED NOT DETECTED Final   Klebsiella pneumoniae NOT DETECTED NOT DETECTED Final   Proteus species NOT DETECTED NOT DETECTED Final   Serratia marcescens NOT DETECTED NOT DETECTED Final   Haemophilus influenzae NOT DETECTED NOT DETECTED Final   Neisseria meningitidis NOT DETECTED NOT DETECTED Final   Pseudomonas aeruginosa NOT DETECTED NOT DETECTED Final    Candida albicans NOT DETECTED NOT DETECTED Final   Candida glabrata NOT DETECTED NOT DETECTED Final   Candida krusei NOT DETECTED NOT DETECTED Final   Candida parapsilosis NOT DETECTED NOT DETECTED Final   Candida tropicalis NOT DETECTED NOT DETECTED Final    Comment: Performed at [facility], [address] [place], [place] [address]  Blood culture (routine x 2)     Status: Abnormal   Collection Time: 10/26/19  6:45 AM   Specimen: BLOOD RIGHT FOREARM  Result  Value Ref Range Status   Specimen Description BLOOD RIGHT FOREARM  Final   Special Requests   Final    BOTTLES DRAWN AEROBIC AND ANAEROBIC Blood Culture adequate volume   Culture  Setup Time   Final    IN BOTH AEROBIC AND ANAEROBIC BOTTLES GRAM POSITIVE COCCI CRITICAL VALUE NOTED.  VALUE IS CONSISTENT WITH PREVIOUSLY REPORTED AND CALLED VALUE.    Culture (A)  Final    STAPHYLOCOCCUS AUREUS SUSCEPTIBILITIES PERFORMED ON PREVIOUS CULTURE WITHIN THE LAST 5 DAYS. Performed at [facility], [place] [address] [place], [place] [address]    Report Status 10/29/2019 FINAL  Final  SARS CORONAVIRUS 2 (TAT 6-24 HRS) Nasopharyngeal Nasopharyngeal Swab     Status: None   Collection Time: 10/26/19  6:55 AM   Specimen: Nasopharyngeal Swab  Result Value Ref Range Status   SARS Coronavirus 2 NEGATIVE NEGATIVE Final    Comment: (NOTE) SARS-CoV-2 target nucleic acids are NOT DETECTED. The SARS-CoV-2 RNA is generally detectable in upper and lower respiratory specimens during the acute phase of infection. Negative results do not preclude SARS-CoV-2 infection, do not rule out co-infections with other pathogens, and should not be used as the sole basis for treatment or other patient management decisions. Negative results must be combined with clinical observations, patient history, and epidemiological information. The expected result is Negative. Fact Sheet for Patients: SugarRoll.be Fact Sheet for  Healthcare Providers: https://www.woods-mathews.com/ This test is not yet approved or cleared by the [place] FDA and  has been authorized for detection and/or diagnosis of SARS-CoV-2 by FDA under an Emergency Use Authorization (EUA). This EUA will remain  in effect (meaning this test can be used) for the duration of the COVID-19 declaration under Section 56 4(b)(1) of the Act, 21 U.S.C. section 360bbb-3(b)(1), unless the authorization is terminated or revoked sooner. Performed at [facility], [place] [address] [place], [place] [address]   Urine culture     Status: None   Collection Time: 10/26/19  9:10 AM   Specimen: Urine, Random  Result Value Ref Range Status   Specimen Description URINE, RANDOM  Final   Special Requests SITE NOT SPECIFIED  Final   Culture   Final    NO GROWTH Performed at [facility], [place] [address], [place], [place] [address]    Report Status 10/27/2019 FINAL  Final  Culture, blood (single)     Status: None (Preliminary result)   Collection Time: 10/26/19  7:19 PM   Specimen: BLOOD LEFT HAND  Result Value Ref Range Status   Specimen Description BLOOD LEFT HAND  Final   Special Requests   Final    BOTTLES DRAWN AEROBIC ONLY Blood Culture adequate volume   Culture   Final    NO GROWTH 4 DAYS Performed at [facility], [place] [address], [place], [place] [address]    Report Status PENDING  Incomplete  Culture, blood (routine x 2)     Status: Abnormal (Preliminary result)   Collection Time: 10/27/19  8:35 PM   Specimen: BLOOD LEFT ARM  Result Value Ref Range Status   Specimen Description BLOOD LEFT ARM  Final   Special Requests   Final    BOTTLES DRAWN AEROBIC AND ANAEROBIC Blood Culture adequate volume   Culture  Setup Time   Final    AEROBIC BOTTLE ONLY GRAM POSITIVE COCCI CRITICAL VALUE NOTED.  VALUE IS CONSISTENT WITH PREVIOUSLY REPORTED AND CALLED VALUE.    Culture (A)  Final  STAPHYLOCOCCUS AUREUS  SUSCEPTIBILITIES PERFORMED ON PREVIOUS CULTURE WITHIN THE LAST 5 DAYS. Performed at [facility], [address] [place], [place] [address]    Report Status PENDING  Incomplete  Culture, blood (routine x 2)     Status: None (Preliminary result)   Collection Time: 10/27/19  8:40 PM   Specimen: BLOOD LEFT HAND  Result Value Ref Range Status   Specimen Description BLOOD LEFT HAND  Final   Special Requests   Final    BOTTLES DRAWN AEROBIC AND ANAEROBIC Blood Culture adequate volume   Culture   Final    NO GROWTH 3 DAYS Performed at [facility], [address] [place], [place] [address]    Report Status PENDING  Incomplete         Radiology Studies: No results found.      Scheduled Meds: . sodium chloride   Intravenous Once  . buprenorphine-naloxone  2 tablet Sublingual BID  . docusate sodium  100 mg Oral BID  . enoxaparin (LOVENOX) injection  40 mg Subcutaneous Q24H  . nicotine  14 mg Transdermal Daily  . sodium chloride flush  3 mL Intravenous Q12H   Continuous Infusions: . vancomycin 500 mg (10/30/19 0558)     LOS: 4 days    Time spent: 25 minutes     Dorcas Carrow, MD [facility] Pager [phone]

## 2019-10-30 NOTE — Interval H&P Note (Signed)
History and Physical Interval Note:  10/30/2019 7:52 AM  [name]  has presented today for surgery, with the diagnosis of BACTEREMIA , IV DRUG USE.  The various methods of treatment have been discussed with the patient and family. After consideration of risks, benefits and other options for treatment, the patient has consented to  Procedure(s): TRANSESOPHAGEAL ECHOCARDIOGRAM (TEE) (N/A) as a surgical intervention.  The patient's history has been reviewed, patient examined, no change in status, stable for surgery.  I have reviewed the patient's chart and labs.  Questions were answered to the patient's satisfaction.     Dorris Carnes

## 2019-10-30 NOTE — Addendum Note (Signed)
Addendum  created 10/30/19 1154 by Sheila Ocasio, MD   Clinical Note Signed    

## 2019-10-30 NOTE — Progress Notes (Signed)
[facility]  Date of Admission:  10/25/2019      Total days of antibiotics 5  Vancomycin day 5         ASSESSMENT: [name] is a 28 y.o. 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 She is very tearful today in discussing treatment options and is unwilling to stay here in the hospital the entire 6 week duration. She is not interested in speaking with the cardiothoracic surgeons. Will repeat her blood cultures now. Discussed we would like to at minimum see her accept 2 weeks of IV therapy once her bacteremia is cleared. She has not indicated whether she will accept this or not. Will work on alternative treatment options - as platelets recover may consider linezolid (also not on any SSRIs). Quinolone + Rifampin alternative option, however rifampin and methodone/suboxone may be prohibitive.   Vasculitis rash fading with treatment.   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 She is not interested in treatment at this time.   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 Awaiting treponemal test for confirmation.   Opioid dependence, now on suboxone - stable after increased dose.     PLAN: 1. Continue Vancomycin  2. Repeat blood cultures now 3. Hold on PICC line 4. Follow treponemal results    Principal Problem:   MRSA bacteremia Active Problems:   Sepsis (HCC)   Septic embolism (HCC)   Cavitary pneumonia   Vasculitis of skin   Cigarette smoker   Polysubstance (including opioids) dependence, daily use (HCC)   Injection of illicit drug within last 12 months   Iron deficiency anemia due to chronic blood loss   . sodium chloride   Intravenous Once  . buprenorphine-naloxone  2 tablet Sublingual BID  . docusate sodium  100 mg Oral BID  . enoxaparin (LOVENOX)  injection  40 mg Subcutaneous Q24H  . nicotine  14 mg Transdermal Daily  . sodium chloride flush  3 mL Intravenous Q12H    SUBJECTIVE: Tearful after TEE - "I don't want to stay here for 6 weeks."   Interval history -  Repeat blood cultures again positive.  TEE confirming  2 vegetations on atrial side of valve both shaggy and elongated; one on the anterior leaflet. LVEF normal, RVEF normal. NO PFO. Mild MR.    Review of Systems: Review of Systems  Constitutional: Negative for chills, diaphoresis, fever and malaise/fatigue.  Respiratory: Positive for cough. Negative for sputum production and shortness of breath.   Cardiovascular: Negative for chest pain and leg swelling.  Gastrointestinal: Negative for abdominal pain and vomiting.  Genitourinary: Negative for dysuria and urgency.  Musculoskeletal: Negative for back pain and joint pain.  Skin: Positive for rash. Negative for itching.  Neurological: Negative for focal weakness, weakness and headaches.  Psychiatric/Behavioral: Positive for depression.    Allergies  Allergen Reactions  . Dextromethorphan Swelling    OBJECTIVE: Vitals:   10/30/19 0559 10/30/19 0718 10/30/19 0830 10/30/19 0843  BP: 102/75 120/83 110/68 109/73  Pulse: 85 88  81  Resp: Temp: 97.9 F (36.6 C) 98.4 F (36.9 C) (!) 97.4 F (36.3 C)   TempSrc: Oral Temporal    SpO2: 100% 98% 98% 100%  Weight:      Height:  Body mass index is 19.53 kg/m.   Physical Exam Constitutional:      Comments: Crying in bed. No distress otherwise.   HENT:     Mouth/Throat:     Mouth: Mucous membranes are moist.     Pharynx: No oropharyngeal exudate.  Eyes:     General: No scleral icterus.    Pupils: Pupils are equal, round, and reactive to light.  Cardiovascular:     Rate and Rhythm: Normal rate and regular rhythm.     Heart sounds: No murmur.  Pulmonary:     Effort: Pulmonary effort is normal.     Breath sounds: Rhonchi (occasional )  present.  Abdominal:     General: Bowel sounds are normal.     Palpations: Abdomen is soft.  Musculoskeletal: Normal range of motion.        General: No tenderness.  Skin:    General: Skin is warm and dry.     Capillary Refill: Capillary refill takes less than 2 seconds.     Findings: Rash (rash is light salmon colored and more coalesced today. Flat. ) present.  Neurological:     Mental Status: She is alert and oriented to person, place, and time.  Psychiatric:     Comments: Tearful     Lab Results Lab Results  Component Value Date   WBC 5.5 10/29/2019   HGB 9.0 (L) 10/29/2019   HCT 29.1 (L) 10/29/2019   MCV 87.7 10/29/2019   PLT 206 10/29/2019    Lab Results  Component Value Date   CREATININE 0.81 10/29/2019   BUN 5 (L) 10/29/2019   NA 140 10/29/2019   K 4.2 10/29/2019   CL 103 10/29/2019   CO2 28 10/29/2019    Lab Results  Component Value Date   ALT 15 10/25/2019   AST 20 10/25/2019   ALKPHOS 53 10/25/2019   BILITOT 0.6 10/25/2019      Microbiology: Recent Results (from the past 240 hour(s))  Blood Culture (routine x 2)     Status: None   Collection Time: 10/22/19  7:39 PM   Specimen: BLOOD  Result Value Ref Range Status   Specimen Description BLOOD RIGHT ANTECUBITAL  Final   Special Requests   Final    BOTTLES DRAWN AEROBIC ONLY Blood Culture results may not be optimal due to an inadequate volume of blood received in culture bottles   Culture   Final    NO GROWTH 5 DAYS Performed at [facility], [address], [place], [place] [address]    Report Status 10/27/2019 FINAL  Final  Urine culture     Status: Abnormal   Collection Time: 10/22/19  9:10 PM   Specimen: In/Out Cath Urine  Result Value Ref Range Status   Specimen Description IN/OUT CATH URINE  Final   Special Requests   Final    NONE Performed at [facility], [address] [place], [place] [address]    Culture >=100,000 COLONIES/mL VIRIDANS STREPTOCOCCUS (A)  Final   Report  Status 10/25/2019 FINAL  Final  Blood culture (routine x 2)     Status: Abnormal   Collection Time: 10/26/19  6:28 AM   Specimen: BLOOD RIGHT HAND  Result Value Ref Range Status   Specimen Description BLOOD RIGHT HAND  Final   Special Requests   Final    BOTTLES DRAWN AEROBIC AND ANAEROBIC Blood Culture results may not be optimal due to an inadequate volume of blood received in culture bottles  Culture  Setup Time   Final    IN BOTH AEROBIC AND ANAEROBIC BOTTLES GRAM POSITIVE COCCI CRITICAL RESULT CALLED TO, READ BACK BY AND VERIFIED WITH: Tori Milks PHARMD 10/27/19 0118 JDW Performed at [facility], [address], [place], [place] [address]    Culture METHICILLIN RESISTANT STAPHYLOCOCCUS AUREUS (A)  Final   Report Status 10/29/2019 FINAL  Final   Organism ID, Bacteria METHICILLIN RESISTANT STAPHYLOCOCCUS AUREUS  Final      Susceptibility   Methicillin resistant staphylococcus aureus - MIC*    CIPROFLOXACIN >=8 RESISTANT Resistant     ERYTHROMYCIN >=8 RESISTANT Resistant     GENTAMICIN <=0.5 SENSITIVE Sensitive     OXACILLIN >=4 RESISTANT Resistant     TETRACYCLINE <=1 SENSITIVE Sensitive     VANCOMYCIN 1 SENSITIVE Sensitive     TRIMETH/SULFA <=10 SENSITIVE Sensitive     CLINDAMYCIN <=0.25 SENSITIVE Sensitive     RIFAMPIN <=0.5 SENSITIVE Sensitive     Inducible Clindamycin NEGATIVE Sensitive     * METHICILLIN RESISTANT STAPHYLOCOCCUS AUREUS  Blood Culture ID Panel (Reflexed)     Status: Abnormal   Collection Time: 10/26/19  6:28 AM  Result Value Ref Range Status   Enterococcus species NOT DETECTED NOT DETECTED Final   Listeria monocytogenes NOT DETECTED NOT DETECTED Final   Staphylococcus species DETECTED (A) NOT DETECTED Final    Comment: CRITICAL RESULT CALLED TO, READ BACK BY AND VERIFIED WITH: L SEAY PHARMD 10/27/19 0118 JDW    Staphylococcus aureus (BCID) DETECTED (A) NOT DETECTED Final    Comment: Methicillin (oxacillin)-resistant Staphylococcus aureus (MRSA). MRSA  is predictably resistant to beta-lactam antibiotics (except ceftaroline). Preferred therapy is vancomycin unless clinically contraindicated. Patient requires contact precautions if  hospitalized. CRITICAL RESULT CALLED TO, READ BACK BY AND VERIFIED WITH: L SEAY PHARM 10/27/19 0118 JDW    Methicillin resistance DETECTED (A) NOT DETECTED Final    Comment: CRITICAL RESULT CALLED TO, READ BACK BY AND VERIFIED WITH: L SEAY PHARMD 10/27/19 0118 JDW    Streptococcus species NOT DETECTED NOT DETECTED Final   Streptococcus agalactiae NOT DETECTED NOT DETECTED Final   Streptococcus pneumoniae NOT DETECTED NOT DETECTED Final   Streptococcus pyogenes NOT DETECTED NOT DETECTED Final   Acinetobacter baumannii NOT DETECTED NOT DETECTED Final   Enterobacteriaceae species NOT DETECTED NOT DETECTED Final   Enterobacter cloacae complex NOT DETECTED NOT DETECTED Final   Escherichia coli NOT DETECTED NOT DETECTED Final   Klebsiella oxytoca NOT DETECTED NOT DETECTED Final   Klebsiella pneumoniae NOT DETECTED NOT DETECTED Final   Proteus species NOT DETECTED NOT DETECTED Final   Serratia marcescens NOT DETECTED NOT DETECTED Final   Haemophilus influenzae NOT DETECTED NOT DETECTED Final   Neisseria meningitidis NOT DETECTED NOT DETECTED Final   Pseudomonas aeruginosa NOT DETECTED NOT DETECTED Final   Candida albicans NOT DETECTED NOT DETECTED Final   Candida glabrata NOT DETECTED NOT DETECTED Final   Candida krusei NOT DETECTED NOT DETECTED Final   Candida parapsilosis NOT DETECTED NOT DETECTED Final   Candida tropicalis NOT DETECTED NOT DETECTED Final    Comment: Performed at [facility], [address] [place], [place] [address]  Blood culture (routine x 2)     Status: Abnormal   Collection Time: 10/26/19  6:45 AM   Specimen: BLOOD RIGHT FOREARM  Result Value Ref Range Status   Specimen Description BLOOD RIGHT FOREARM  Final   Special Requests   Final    BOTTLES DRAWN AEROBIC AND ANAEROBIC  Blood  Culture adequate volume   Culture  Setup Time   Final    IN BOTH AEROBIC AND ANAEROBIC BOTTLES GRAM POSITIVE COCCI CRITICAL VALUE NOTED.  VALUE IS CONSISTENT WITH PREVIOUSLY REPORTED AND CALLED VALUE.    Culture (A)  Final    STAPHYLOCOCCUS AUREUS SUSCEPTIBILITIES PERFORMED ON PREVIOUS CULTURE WITHIN THE LAST 5 DAYS. Performed at [facility], [address] [place], [place] [address]    Report Status 10/29/2019 FINAL  Final  SARS CORONAVIRUS 2 (TAT 6-24 HRS) Nasopharyngeal Nasopharyngeal Swab     Status: None   Collection Time: 10/26/19  6:55 AM   Specimen: Nasopharyngeal Swab  Result Value Ref Range Status   SARS Coronavirus 2 NEGATIVE NEGATIVE Final    Comment: (NOTE) SARS-CoV-2 target nucleic acids are NOT DETECTED. The SARS-CoV-2 RNA is generally detectable in upper and lower respiratory specimens during the acute phase of infection. Negative results do not preclude SARS-CoV-2 infection, do not rule out co-infections with other pathogens, and should not be used as the sole basis for treatment or other patient management decisions. Negative results must be combined with clinical observations, patient history, and epidemiological information. The expected result is Negative. Fact Sheet for Patients: HairSlick.nohttps://www.fda.gov/media/138098/download Fact Sheet for Healthcare Providers: quierodirigir.comhttps://www.fda.gov/media/138095/download This test is not yet approved or cleared by the [place] FDA and  has been authorized for detection and/or diagnosis of SARS-CoV-2 by FDA under an Emergency Use Authorization (EUA). This EUA will remain  in effect (meaning this test can be used) for the duration of the COVID-19 declaration under Section 56 4(b)(1) of the Act, 21 U.S.C. section 360bbb-3(b)(1), unless the authorization is terminated or revoked sooner. Performed at [facility], [address] [place], [place] [address]   Urine culture     Status: None   Collection  Time: 10/26/19  9:10 AM   Specimen: Urine, Random  Result Value Ref Range Status   Specimen Description URINE, RANDOM  Final   Special Requests SITE NOT SPECIFIED  Final   Culture   Final    NO GROWTH Performed at [facility], [address] [place], [place] [address]    Report Status 10/27/2019 FINAL  Final  Culture, blood (single)     Status: None (Preliminary result)   Collection Time: 10/26/19  7:19 PM   Specimen: BLOOD LEFT HAND  Result Value Ref Range Status   Specimen Description BLOOD LEFT HAND  Final   Special Requests   Final    BOTTLES DRAWN AEROBIC ONLY Blood Culture adequate volume   Culture   Final    NO GROWTH 4 DAYS Performed at [facility], [address] [place], [place] [address]    Report Status PENDING  Incomplete  Culture, blood (routine x 2)     Status: Abnormal (Preliminary result)   Collection Time: 10/27/19  8:35 PM   Specimen: BLOOD LEFT ARM  Result Value Ref Range Status   Specimen Description BLOOD LEFT ARM  Final   Special Requests   Final    BOTTLES DRAWN AEROBIC AND ANAEROBIC Blood Culture adequate volume   Culture  Setup Time   Final    AEROBIC BOTTLE ONLY GRAM POSITIVE COCCI CRITICAL VALUE NOTED.  VALUE IS CONSISTENT WITH PREVIOUSLY REPORTED AND CALLED VALUE.    Culture (A)  Final    STAPHYLOCOCCUS AUREUS SUSCEPTIBILITIES PERFORMED ON PREVIOUS CULTURE WITHIN THE LAST 5 DAYS. Performed at [facility], [address] [place], [place] [address]  Report Status PENDING  Incomplete  Culture, blood (routine x 2)     Status: None (Preliminary result)   Collection Time: 10/27/19  8:40 PM   Specimen: BLOOD LEFT HAND  Result Value Ref Range Status   Specimen Description BLOOD LEFT HAND  Final   Special Requests   Final    BOTTLES DRAWN AEROBIC AND ANAEROBIC Blood Culture adequate volume   Culture   Final    NO GROWTH 3 DAYS Performed at [facility], [address] [place], [place] [address]    Report Status  PENDING  Incomplete    Janene Madeira, MSN, NP-C [facility] [email or web address] Pager: [phone] Office: [phone] [employer]: [phone]

## 2019-10-30 NOTE — Progress Notes (Signed)
  Echocardiogram Echocardiogram Transesophageal has been performed.  Bobbye Charleston 10/30/2019, 8:36 AM

## 2019-10-30 NOTE — Anesthesia Postprocedure Evaluation (Addendum)
Anesthesia Post Note  Patient: [name]  Procedure(s) Performed: TRANSESOPHAGEAL ECHOCARDIOGRAM (TEE) (N/A ) BUBBLE STUDY     Patient location during evaluation: Endoscopy Anesthesia Type: MAC Level of consciousness: awake and alert Pain management: pain level controlled Vital Signs Assessment: post-procedure vital signs reviewed and stable Respiratory status: spontaneous breathing and respiratory function stable Cardiovascular status: stable Postop Assessment: no apparent nausea or vomiting Anesthetic complications: no    Last Vitals:  Vitals:   10/30/19 0830 10/30/19 0843  BP: 110/68 109/73  Pulse:  81  Resp: 19 15  Temp: (!) 36.3 C   SpO2: 98% 100%    Last Pain:  Vitals:   10/30/19 0843  TempSrc:   PainSc: 0-No pain                 [name] DANIEL

## 2019-10-30 NOTE — Transfer of Care (Signed)
Immediate Anesthesia Transfer of Care Note  Patient: [name]  Procedure(s) Performed: TRANSESOPHAGEAL ECHOCARDIOGRAM (TEE) (N/A ) BUBBLE STUDY  Patient Location: Endoscopy Unit  Anesthesia Type:MAC  Level of Consciousness: awake, alert  and oriented  Airway & Oxygen Therapy: Patient Spontanous Breathing and Patient connected to nasal cannula oxygen  Post-op Assessment: Report given to RN and Post -op Vital signs reviewed and stable  Post vital signs: Reviewed and stable  Last Vitals:  Vitals Value Taken Time  BP    Temp    Pulse    Resp    SpO2      Last Pain:  Vitals:   10/30/19 0718  TempSrc: Temporal  PainSc: 0-No pain         Complications: No apparent anesthesia complications

## 2019-10-30 NOTE — Consult Note (Signed)
TCTS Consult Asked to see patient about TV endocarditis with septic pulm emboli. She refuses to discuss her condition with me because "it scares me." She does not appear to meet criteria for surgery at present. Please reconsult when/if her condition changes. Octavian Godek Z. Orvan Seen, MD

## 2019-10-30 NOTE — CV Procedure (Addendum)
TEE  Patient sedated by anesthesia with propofol TEE probe advanced to mid esophagus without difficulty  Findings:  TV with 2 vegetations on atrial side of valve  Both shaggy, elongated   One on septal leaflet   One on anterior leaflet.   Mild MR AV normal   Trivial AI PV normal MV normal  Trivial MR  LVEF and RVEF are normal  LAA without masses  No PFO as tested with injection of agitated saline      Normal thoracic aorta   Full report to follow    Procedure without complication  Nevin Bloodgood RossMD

## 2019-10-30 NOTE — Plan of Care (Signed)

## 2019-10-30 NOTE — Anesthesia Procedure Notes (Signed)
Procedure Name: MAC Date/Time: 10/30/2019 8:03 AM Performed by: Candis Shine, CRNA Pre-anesthesia Checklist: Patient identified, Emergency Drugs available, Suction available, Patient being monitored and Timeout performed Patient Re-evaluated:Patient Re-evaluated prior to induction Oxygen Delivery Method: Nasal cannula Dental Injury: Teeth and Oropharynx as per pre-operative assessment

## 2019-10-31 DIAGNOSIS — R918 Other nonspecific abnormal finding of lung field: Secondary | ICD-10-CM

## 2019-10-31 DIAGNOSIS — I079 Rheumatic tricuspid valve disease, unspecified: Secondary | ICD-10-CM

## 2019-10-31 DIAGNOSIS — B182 Chronic viral hepatitis C: Secondary | ICD-10-CM

## 2019-10-31 LAB — CULTURE, BLOOD (ROUTINE X 2): Special Requests: ADEQUATE

## 2019-10-31 LAB — CULTURE, BLOOD (SINGLE)
Culture: NO GROWTH
Special Requests: ADEQUATE

## 2019-10-31 MED ORDER — VANCOMYCIN HCL 10 G IV SOLR
1250.0000 mg | INTRAVENOUS | Status: DC
  Filled 2019-10-31: qty 1250

## 2019-10-31 NOTE — Progress Notes (Signed)
Pharmacy Antibiotic Note  [name] is a 28 y.o. female admitted on 10/25/2019 with fever and rash.  Pharmacy has been consulted for vancomycin dosing for cavitary PNA with suspected septic emboli and concern for endocarditis.  Renal function is stable and vancomycin AUC is subtherapeutic.  Plan: Increase to vancomycin 1250mg  IV Q24H Monitor renal fxn, clinical progress, weekly AUC  Height: 5' (152.4 cm) Weight: 100 lb (45.4 kg) IBW/kg (Calculated) : 45.5  Temp (24hrs), Avg:97.8 F (36.6 C), Min:97.6 F (36.4 C), Max:98 F (36.7 C)  Recent Labs  Lab 10/25/19 1725 10/27/19 0658 10/28/19 0333 10/28/19 2157 10/29/19 0937 10/29/19 1811  WBC 7.0 5.2 5.5  --  5.5  --   CREATININE 0.86 0.69 0.77  --  0.81  --   LATICACIDVEN 0.8  --   --   --   --   --   VANCOTROUGH  --   --   --   --   --  9*  VANCOPEAK  --   --   --  19* 24*  --     Estimated Creatinine Clearance: 73.4 mL/min (by C-G formula based on SCr of 0.81 mg/dL).    Allergies  Allergen Reactions  . Dextromethorphan Swelling   Vanc 10/29 >> Cefepime 10/29 >>10/30  11/1 VP/VT 24/9, AUC = 399, reassessed 11/3. Will target higher AUC with endocarditis.  10/25 UCx >> Viridans Strep 10/29 BCx >>MRSA 10/29 UCx >>ngF 10/29 COVID >>neg 10/30 BCx >> ngtd  Thuy D. Mina Marble, PharmD, BCPS, [place] 10/31/2019, 8:45 AM

## 2019-10-31 NOTE — Progress Notes (Signed)
Explained to pt need to stay in hospital for IV antibiotics for her infection, pt states I can't stay here, I feel like I'm a prisoner, I'm cooped up, I need to go. Paged dr Lucianne Lei dam regarding pt leaving AMA, gave pt infectious Disease office phone number [phone] and transitional pharmacy phone number [phone] for antibiotic by mouth. States she will call tomorrow. Peripheral IV removed, pt sign paper leaving against medical advice.

## 2019-10-31 NOTE — Plan of Care (Signed)

## 2019-10-31 NOTE — Progress Notes (Signed)
[facility]  Date of Admission:  10/25/2019      Total days of antibiotics 6  Vancomycin day 6         ASSESSMENT: Holly RoanCarey M [name] is a 28 y.o. 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 Also presented with vasculitis rash in this setting that faded with appropriate treatment.   Endocarditis of Native Tricuspid Valve - Declined speaking with the cardiothoracic surgeons for consideration of possible percutaneous debulking vs medical management vs repair. Dr. Vickey SagesAtkins reviewed briefly and likely felt she would do well with medical management alone for now. Repented blood cultures no growth @ 12h.  Will plan to continue her vancomycin for hopefully 2 weeks following documented clearance of blood stream infection and plan for Linezolid thereafter for 6 weeks if she is not willing to say beyond that.   Chronic Hep C - RNA + 336,000 copies noted 4 days ago. She is not interested in treatment at this time.   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 No treatment indicated.   Opioid dependence, now on suboxone - stable after increased dose. Per Primary team.    PLAN: 1. Continue Vancomycin  2. Follow repeat blood cultures drawn 11/02.  3. Hold on PICC line   Principal Problem:   Endocarditis of tricuspid valve Active Problems:   Sepsis (HCC)   Septic embolism (HCC)   Cavitary pneumonia   MRSA bacteremia   Vasculitis of skin   Cigarette smoker   Polysubstance (including opioids) dependence, daily use (HCC)   Injection of illicit drug within last 12 months   Iron deficiency anemia due to chronic blood loss   . sodium chloride   Intravenous Once  . buprenorphine-naloxone  2 tablet Sublingual BID  . docusate sodium  100 mg Oral BID  . enoxaparin (LOVENOX)  injection  40 mg Subcutaneous Q24H  . nicotine  14 mg Transdermal Daily  . sodium chloride flush  3 mL Intravenous Q12H    SUBJECTIVE: Asleep and will not wake up for me.   Interval history -  Afebrile and WBC normal.  Repeat blood cultures pending are no growth 12h Refused discussion with CT surgery team   Review of Systems: Asleep / refused   Allergies  Allergen Reactions  . Dextromethorphan Swelling    OBJECTIVE: Vitals:   10/30/19 2021 10/30/19 2328 10/31/19 0349 10/31/19 0738  BP: (!) 112/101 117/66 101/61 102/61  Pulse: 98 98 76 72  Resp: 17 18 13 20   Temp: 97.7 F (36.5 C) 98 F (36.7 C) 97.6 F (36.4 C) 97.6 F (36.4 C)  TempSrc: Axillary Axillary Oral Oral  SpO2: 98% 97% 97% 98%  Weight:      Height:       Body mass index is 19.53 kg/m.   Physical Exam Constitutional:      Comments: Sleeping quietly in bed. Will not wake for discussion.   Cardiovascular:     Rate and Rhythm: Normal rate and regular rhythm.     Comments: NSR on telemetry  Pulmonary:     Effort: Pulmonary effort is normal.     Lab Results Lab Results  Component Value Date   WBC 5.5 10/29/2019   HGB 9.0 (L) 10/29/2019   HCT 29.1 (L) 10/29/2019   HCT 27.6 (L) 10/29/2019   MCV 87.7  10/29/2019   PLT 206 10/29/2019    Lab Results  Component Value Date   CREATININE 0.81 10/29/2019   BUN 5 (L) 10/29/2019   NA 140 10/29/2019   K 4.2 10/29/2019   CL 103 10/29/2019   CO2 28 10/29/2019    Lab Results  Component Value Date   ALT 15 10/25/2019   AST 20 10/25/2019   ALKPHOS 53 10/25/2019   BILITOT 0.6 10/25/2019      Microbiology: Recent Results (from the past 240 hour(s))  Blood Culture (routine x 2)     Status: None   Collection Time: 10/22/19  7:39 PM   Specimen: BLOOD  Result Value Ref Range Status   Specimen Description BLOOD RIGHT ANTECUBITAL  Final   Special Requests   Final    BOTTLES DRAWN AEROBIC ONLY Blood Culture results may not be optimal due to an  inadequate volume of blood received in culture bottles   Culture   Final    NO GROWTH 5 DAYS Performed at [facility], [address] [place], [place] [address]    Report Status 10/27/2019 FINAL  Final  Urine culture     Status: Abnormal   Collection Time: 10/22/19  9:10 PM   Specimen: In/Out Cath Urine  Result Value Ref Range Status   Specimen Description IN/OUT CATH URINE  Final   Special Requests   Final    NONE Performed at [facility], [address], [place], [place] [address]    Culture >=100,000 COLONIES/mL VIRIDANS STREPTOCOCCUS (A)  Final   Report Status 10/25/2019 FINAL  Final  Blood culture (routine x 2)     Status: Abnormal   Collection Time: 10/26/19  6:28 AM   Specimen: BLOOD RIGHT HAND  Result Value Ref Range Status   Specimen Description BLOOD RIGHT HAND  Final   Special Requests   Final    BOTTLES DRAWN AEROBIC AND ANAEROBIC Blood Culture results may not be optimal due to an inadequate volume of blood received in culture bottles   Culture  Setup Time   Final    IN BOTH AEROBIC AND ANAEROBIC BOTTLES GRAM POSITIVE COCCI CRITICAL RESULT CALLED TO, READ BACK BY AND VERIFIED WITH: Tori Milks PHARMD 10/27/19 0118 JDW Performed at [facility], [address], [place], [place] [address]    Culture METHICILLIN RESISTANT STAPHYLOCOCCUS AUREUS (A)  Final   Report Status 10/29/2019 FINAL  Final   Organism ID, Bacteria METHICILLIN RESISTANT STAPHYLOCOCCUS AUREUS  Final      Susceptibility   Methicillin resistant staphylococcus aureus - MIC*    CIPROFLOXACIN >=8 RESISTANT Resistant     ERYTHROMYCIN >=8 RESISTANT Resistant     GENTAMICIN <=0.5 SENSITIVE Sensitive     OXACILLIN >=4 RESISTANT Resistant     TETRACYCLINE <=1 SENSITIVE Sensitive     VANCOMYCIN 1 SENSITIVE Sensitive     TRIMETH/SULFA <=10 SENSITIVE Sensitive     CLINDAMYCIN <=0.25 SENSITIVE Sensitive     RIFAMPIN <=0.5 SENSITIVE Sensitive     Inducible Clindamycin NEGATIVE Sensitive     *  METHICILLIN RESISTANT STAPHYLOCOCCUS AUREUS  Blood Culture ID Panel (Reflexed)     Status: Abnormal   Collection Time: 10/26/19  6:28 AM  Result Value Ref Range Status   Enterococcus species NOT DETECTED NOT DETECTED Final   Listeria monocytogenes NOT DETECTED NOT DETECTED Final   Staphylococcus species DETECTED (A) NOT DETECTED Final    Comment: CRITICAL RESULT CALLED TO, READ BACK BY AND VERIFIED WITH:  L SEAY PHARMD 10/27/19 0118 JDW    Staphylococcus aureus (BCID) DETECTED (A) NOT DETECTED Final    Comment: Methicillin (oxacillin)-resistant Staphylococcus aureus (MRSA). MRSA is predictably resistant to beta-lactam antibiotics (except ceftaroline). Preferred therapy is vancomycin unless clinically contraindicated. Patient requires contact precautions if  hospitalized. CRITICAL RESULT CALLED TO, READ BACK BY AND VERIFIED WITH: L SEAY PHARM 10/27/19 0118 JDW    Methicillin resistance DETECTED (A) NOT DETECTED Final    Comment: CRITICAL RESULT CALLED TO, READ BACK BY AND VERIFIED WITH: L SEAY PHARMD 10/27/19 0118 JDW    Streptococcus species NOT DETECTED NOT DETECTED Final   Streptococcus agalactiae NOT DETECTED NOT DETECTED Final   Streptococcus pneumoniae NOT DETECTED NOT DETECTED Final   Streptococcus pyogenes NOT DETECTED NOT DETECTED Final   Acinetobacter baumannii NOT DETECTED NOT DETECTED Final   Enterobacteriaceae species NOT DETECTED NOT DETECTED Final   Enterobacter cloacae complex NOT DETECTED NOT DETECTED Final   Escherichia coli NOT DETECTED NOT DETECTED Final   Klebsiella oxytoca NOT DETECTED NOT DETECTED Final   Klebsiella pneumoniae NOT DETECTED NOT DETECTED Final   Proteus species NOT DETECTED NOT DETECTED Final   Serratia marcescens NOT DETECTED NOT DETECTED Final   Haemophilus influenzae NOT DETECTED NOT DETECTED Final   Neisseria meningitidis NOT DETECTED NOT DETECTED Final   Pseudomonas aeruginosa NOT DETECTED NOT DETECTED Final   Candida albicans NOT DETECTED  NOT DETECTED Final   Candida glabrata NOT DETECTED NOT DETECTED Final   Candida krusei NOT DETECTED NOT DETECTED Final   Candida parapsilosis NOT DETECTED NOT DETECTED Final   Candida tropicalis NOT DETECTED NOT DETECTED Final    Comment: Performed at [facility], [place], [place], [place] [address]  Blood culture (routine x 2)     Status: Abnormal   Collection Time: 10/26/19  6:45 AM   Specimen: BLOOD RIGHT FOREARM  Result Value Ref Range Status   Specimen Description BLOOD RIGHT FOREARM  Final   Special Requests   Final    BOTTLES DRAWN AEROBIC AND ANAEROBIC Blood Culture adequate volume   Culture  Setup Time   Final    IN BOTH AEROBIC AND ANAEROBIC BOTTLES GRAM POSITIVE COCCI CRITICAL VALUE NOTED.  VALUE IS CONSISTENT WITH PREVIOUSLY REPORTED AND CALLED VALUE.    Culture (A)  Final    STAPHYLOCOCCUS AUREUS SUSCEPTIBILITIES PERFORMED ON PREVIOUS CULTURE WITHIN THE LAST 5 DAYS. Performed at [facility], [place] [address], [place], [place] [address]    Report Status 10/29/2019 FINAL  Final  SARS CORONAVIRUS 2 (TAT 6-24 HRS) Nasopharyngeal Nasopharyngeal Swab     Status: None   Collection Time: 10/26/19  6:55 AM   Specimen: Nasopharyngeal Swab  Result Value Ref Range Status   SARS Coronavirus 2 NEGATIVE NEGATIVE Final    Comment: (NOTE) SARS-CoV-2 target nucleic acids are NOT DETECTED. The SARS-CoV-2 RNA is generally detectable in upper and lower respiratory specimens during the acute phase of infection. Negative results do not preclude SARS-CoV-2 infection, do not rule out co-infections with other pathogens, and should not be used as the sole basis for treatment or other patient management decisions. Negative results must be combined with clinical observations, patient history, and epidemiological information. The expected result is Negative. Fact Sheet for Patients: SugarRoll.be Fact Sheet for Healthcare Providers:  https://www.woods-mathews.com/ This test is not yet approved or cleared by the [place] FDA and  has been authorized for detection and/or diagnosis of SARS-CoV-2 by FDA under an Emergency Use Authorization (EUA). This  EUA will remain  in effect (meaning this test can be used) for the duration of the COVID-19 declaration under Section 56 4(b)(1) of the Act, 21 U.S.C. section 360bbb-3(b)(1), unless the authorization is terminated or revoked sooner. Performed at [facility], [address], [place], [place] [address]   Urine culture     Status: None   Collection Time: 10/26/19  9:10 AM   Specimen: Urine, Random  Result Value Ref Range Status   Specimen Description URINE, RANDOM  Final   Special Requests SITE NOT SPECIFIED  Final   Culture   Final    NO GROWTH Performed at [facility], [address] [place], [place] [address]    Report Status 10/27/2019 FINAL  Final  Culture, blood (single)     Status: None (Preliminary result)   Collection Time: 10/26/19  7:19 PM   Specimen: BLOOD LEFT HAND  Result Value Ref Range Status   Specimen Description BLOOD LEFT HAND  Final   Special Requests   Final    BOTTLES DRAWN AEROBIC ONLY Blood Culture adequate volume   Culture   Final    NO GROWTH 4 DAYS Performed at [facility], [address], [place], [place] [address]    Report Status PENDING  Incomplete  Culture, blood (routine x 2)     Status: Abnormal   Collection Time: 10/27/19  8:35 PM   Specimen: BLOOD LEFT ARM  Result Value Ref Range Status   Specimen Description BLOOD LEFT ARM  Final   Special Requests   Final    BOTTLES DRAWN AEROBIC AND ANAEROBIC Blood Culture adequate volume   Culture  Setup Time   Final    AEROBIC BOTTLE ONLY GRAM POSITIVE COCCI CRITICAL VALUE NOTED.  VALUE IS CONSISTENT WITH PREVIOUSLY REPORTED AND CALLED VALUE.    Culture (A)  Final    STAPHYLOCOCCUS AUREUS SUSCEPTIBILITIES PERFORMED ON PREVIOUS CULTURE WITHIN THE  LAST 5 DAYS.    Report Status 10/31/2019 FINAL  Final  Culture, blood (routine x 2)     Status: None (Preliminary result)   Collection Time: 10/27/19  8:40 PM   Specimen: BLOOD LEFT HAND  Result Value Ref Range Status   Specimen Description BLOOD LEFT HAND  Final   Special Requests   Final    BOTTLES DRAWN AEROBIC AND ANAEROBIC Blood Culture adequate volume   Culture   Final    NO GROWTH 3 DAYS Performed at [facility], [address], [place], [place] [address]    Report Status PENDING  Incomplete  Culture, blood (routine x 2)     Status: None (Preliminary result)   Collection Time: 10/30/19  1:15 PM   Specimen: BLOOD RIGHT ARM  Result Value Ref Range Status   Specimen Description BLOOD RIGHT ARM  Final   Special Requests   Final    BOTTLES DRAWN AEROBIC AND ANAEROBIC Blood Culture results may not be optimal due to an inadequate volume of blood received in culture bottles   Culture   Final    NO GROWTH <12 HOURS Performed at [facility], [address] [place], [place] [address]    Report Status PENDING  Incomplete  Culture, blood (routine x 2)     Status: None (Preliminary result)   Collection Time: 10/30/19  1:20 PM   Specimen: BLOOD  Result Value Ref Range Status   Specimen Description BLOOD LEFT ANTECUBITAL  Final   Special Requests   Final  BOTTLES DRAWN AEROBIC AND ANAEROBIC Blood Culture results may not be optimal due to an inadequate volume of blood received in culture bottles   Culture   Final    NO GROWTH <12 HOURS Performed at [facility], [address], [place], [place] [address]    Report Status PENDING  Incomplete    Rexene Alberts, MSN, NP-C [facility]  [email or web address] Pager: [phone] Office: [phone] [facility] Main Line: [phone]

## 2019-10-31 NOTE — Progress Notes (Signed)
Pt wants to leave AMA, paged dr. Raelyn Mora.

## 2019-10-31 NOTE — Progress Notes (Signed)
PROGRESS NOTE    [name]  DXI:[id number] DOB: [birth year] DOA: 10/25/2019 PCP: Ronnald Nian, MD    Brief Narrative:  28 year old female with history of depression, polysubstance abuse and injectable drug use presented on 10/22/2019 to the emergency room with fever and rash, left to go home, she felt more sick and came back to emergency room on 10/25/2019 with fever, tachycardia, blood pressure 88/61.  COVID-19 test was negative.  CT chest showed multiple focal cavitary lesions bilaterally.  Blood cultures MRSA.  She had generalized skin rashes that are improving. 10/27/2019, 1 unit of PRBC for hemoglobin of less than 7.   Assessment & Plan:   Principal Problem:   Endocarditis of tricuspid valve Active Problems:   Cigarette smoker   Polysubstance (including opioids) dependence, daily use (HCC)   Sepsis (HCC)   Septic embolism (HCC)   Cavitary pneumonia   Vasculitis of skin   MRSA bacteremia   Injection of illicit drug within last 12 months   Iron deficiency anemia due to chronic blood loss  Sepsis present on admission, bilateral cavitary pneumonia, septic emboli and MRSA bacteremia and tricuspid valve endocarditis. Currently remains on vancomycin. Blood cultures, 10/26/2019 + for MRSA Blood cultures, 10/27/2019 positive for MRSA Blood cultures, 10/30/2019, drawn, no growth so far CT scan with bilateral cavitary pneumonia. 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 afebrile, WBC count normalized. Skin rashes fading. Followed by infectious disease.  She will need prolonged IV antibiotics. Urine cultures with Streptococcus viridans.   significance unknown. Patient not wanting to talk about surgical evaluation.  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  Started on Suboxone with good symptom control.  Also on symptomatic treatment with muscle relaxants and antispasmodics.  Smoker: On nicotine patch.  Iron deficiency anemia due to chronic blood  loss: Has chronic anemia.  Probably iron deficiency.  Received 1 unit of PRBC on 10/27/2019.  Appropriately responded.  Iron and B12 levels are adequate.  We will recheck tomorrow morning.  DVT prophylaxis: Lovenox subcu Code Status: Full code Family Communication: None Disposition Plan: Remains in the hospital   Consultants:   Infectious disease  Procedures:   None  Antimicrobials:   Vancomycin, 10/25/2019----   Subjective: Patient seen and examined.  Not keen to conversation.  Discussed with nursing staff.  She is not willing to have conversation.  Just wants to lie back. Objective: Vitals:   10/30/19 2021 10/30/19 2328 10/31/19 0349 10/31/19 0738  BP: (!) 112/101 117/66 101/61 102/61  Pulse: 98 98 76 72  Resp: 17 18 13 20   Temp: 97.7 F (36.5 C) 98 F (36.7 C) 97.6 F (36.4 C) 97.6 F (36.4 C)  TempSrc: Axillary Axillary Oral Oral  SpO2: 98% 97% 97% 98%  Weight:      Height:        Intake/Output Summary (Last 24 hours) at 10/31/2019 1100 Last data filed at 10/31/2019 0800 Gross per 24 hour  Intake 686.03 ml  Output -  Net 686.03 ml   Filed Weights   10/26/19 0652  Weight: 45.4 kg    Examination:  General exam: Appears calm and comfortable, irritable on interview.  Respiratory system: Clear to auscultation. Respiratory effort normal. Cardiovascular system: S1 & S2 heard, RRR. No JVD, murmurs, rubs, gallops or clicks. No pedal edema. Gastrointestinal system: Abdomen is nondistended, soft and nontender. No organomegaly or masses felt. Normal bowel sounds heard. Central nervous system: Alert and oriented. No focal neurological deficits. Extremities: Symmetric 5 x 5 power. Skin: Generalized  macular rashes mostly fading as compared to previous pictures in epic. Psychiatry: Judgement and insight appear normal. Mood & affect flat.  Uninterested on conversation.    Data Reviewed: I have personally reviewed following labs and imaging studies  CBC: Recent  Labs  Lab 10/25/19 1725 10/27/19 0658 10/28/19 0333 10/29/19 0937  WBC 7.0 5.2 5.5 5.5  NEUTROABS 5.2  --  2.8 2.8  HGB 7.7* 6.9* 8.5* 9.0*  HCT 25.6* 22.4* 27.0* 29.1*  27.6*  MCV 87.7 86.5 86.3 87.7  PLT 111* 120* 154 341   Basic Metabolic Panel: Recent Labs  Lab 10/25/19 1725 10/27/19 0658 10/28/19 0333 10/29/19 0937  NA 132* 139 139 140  K 4.0 4.0 3.9 4.2  CL 94* 105 103 103  CO2 27 28 26 28   GLUCOSE 101* 91 92 135*  BUN 5* 5* 7 5*  CREATININE 0.86 0.69 0.77 0.81  CALCIUM 8.2* 7.9* 8.1* 8.1*   GFR: Estimated Creatinine Clearance: 73.4 mL/min (by C-G formula based on SCr of 0.81 mg/dL). Liver Function Tests: Recent Labs  Lab 10/25/19 1725  AST 20  ALT 15  ALKPHOS 53  BILITOT 0.6  PROT 7.2  ALBUMIN 2.0*   No results for input(s): LIPASE, AMYLASE in the last 168 hours. No results for input(s): AMMONIA in the last 168 hours. Coagulation Profile: No results for input(s): INR, PROTIME in the last 168 hours. Cardiac Enzymes: No results for input(s): CKTOTAL, CKMB, CKMBINDEX, TROPONINI in the last 168 hours. BNP (last 3 results) No results for input(s): PROBNP in the last 8760 hours. HbA1C: No results for input(s): HGBA1C in the last 72 hours. CBG: No results for input(s): GLUCAP in the last 168 hours. Lipid Profile: No results for input(s): CHOL, HDL, LDLCALC, TRIG, CHOLHDL, LDLDIRECT in the last 72 hours. Thyroid Function Tests: No results for input(s): TSH, T4TOTAL, FREET4, T3FREE, THYROIDAB in the last 72 hours. Anemia Panel: Recent Labs    10/29/19 0937  VITAMINB12 390  FERRITIN 147  TIBC 214*  IRON 47   Sepsis Labs: Recent Labs  Lab 10/25/19 1725 10/26/19 1045  PROCALCITON  --  <0.10  LATICACIDVEN 0.8  --     Recent Results (from the past 240 hour(s))  Blood Culture (routine x 2)     Status: None   Collection Time: 10/22/19  7:39 PM   Specimen: BLOOD  Result Value Ref Range Status   Specimen Description BLOOD RIGHT ANTECUBITAL  Final    Special Requests   Final    BOTTLES DRAWN AEROBIC ONLY Blood Culture results may not be optimal due to an inadequate volume of blood received in culture bottles   Culture   Final    NO GROWTH 5 DAYS Performed at [facility], [place] [address], [place], [place] [address]    Report Status 10/27/2019 FINAL  Final  Urine culture     Status: Abnormal   Collection Time: 10/22/19  9:10 PM   Specimen: In/Out Cath Urine  Result Value Ref Range Status   Specimen Description IN/OUT CATH URINE  Final   Special Requests   Final    NONE Performed at [facility], [place] [address] [place], [place] [address]    Culture >=100,000 COLONIES/mL VIRIDANS STREPTOCOCCUS (A)  Final   Report Status 10/25/2019 FINAL  Final  Blood culture (routine x 2)     Status: Abnormal   Collection Time: 10/26/19  6:28 AM   Specimen: BLOOD RIGHT HAND  Result Value Ref Range Status  Specimen Description BLOOD RIGHT HAND  Final   Special Requests   Final    BOTTLES DRAWN AEROBIC AND ANAEROBIC Blood Culture results may not be optimal due to an inadequate volume of blood received in culture bottles   Culture  Setup Time   Final    IN BOTH AEROBIC AND ANAEROBIC BOTTLES GRAM POSITIVE COCCI CRITICAL RESULT CALLED TO, READ BACK BY AND VERIFIED WITH: Tori MilksL SEAY PHARMD 10/27/19 0118 JDW Performed at [facility], [address] [place], [place] [address]    Culture METHICILLIN RESISTANT STAPHYLOCOCCUS AUREUS (A)  Final   Report Status 10/29/2019 FINAL  Final   Organism ID, Bacteria METHICILLIN RESISTANT STAPHYLOCOCCUS AUREUS  Final      Susceptibility   Methicillin resistant staphylococcus aureus - MIC*    CIPROFLOXACIN >=8 RESISTANT Resistant     ERYTHROMYCIN >=8 RESISTANT Resistant     GENTAMICIN <=0.5 SENSITIVE Sensitive     OXACILLIN >=4 RESISTANT Resistant     TETRACYCLINE <=1 SENSITIVE Sensitive     VANCOMYCIN 1 SENSITIVE Sensitive     TRIMETH/SULFA <=10 SENSITIVE Sensitive     CLINDAMYCIN <=0.25  SENSITIVE Sensitive     RIFAMPIN <=0.5 SENSITIVE Sensitive     Inducible Clindamycin NEGATIVE Sensitive     * METHICILLIN RESISTANT STAPHYLOCOCCUS AUREUS  Blood Culture ID Panel (Reflexed)     Status: Abnormal   Collection Time: 10/26/19  6:28 AM  Result Value Ref Range Status   Enterococcus species NOT DETECTED NOT DETECTED Final   Listeria monocytogenes NOT DETECTED NOT DETECTED Final   Staphylococcus species DETECTED (A) NOT DETECTED Final    Comment: CRITICAL RESULT CALLED TO, READ BACK BY AND VERIFIED WITH: L SEAY PHARMD 10/27/19 0118 JDW    Staphylococcus aureus (BCID) DETECTED (A) NOT DETECTED Final    Comment: Methicillin (oxacillin)-resistant Staphylococcus aureus (MRSA). MRSA is predictably resistant to beta-lactam antibiotics (except ceftaroline). Preferred therapy is vancomycin unless clinically contraindicated. Patient requires contact precautions if  hospitalized. CRITICAL RESULT CALLED TO, READ BACK BY AND VERIFIED WITH: L SEAY PHARM 10/27/19 0118 JDW    Methicillin resistance DETECTED (A) NOT DETECTED Final    Comment: CRITICAL RESULT CALLED TO, READ BACK BY AND VERIFIED WITH: L SEAY PHARMD 10/27/19 0118 JDW    Streptococcus species NOT DETECTED NOT DETECTED Final   Streptococcus agalactiae NOT DETECTED NOT DETECTED Final   Streptococcus pneumoniae NOT DETECTED NOT DETECTED Final   Streptococcus pyogenes NOT DETECTED NOT DETECTED Final   Acinetobacter baumannii NOT DETECTED NOT DETECTED Final   Enterobacteriaceae species NOT DETECTED NOT DETECTED Final   Enterobacter cloacae complex NOT DETECTED NOT DETECTED Final   Escherichia coli NOT DETECTED NOT DETECTED Final   Klebsiella oxytoca NOT DETECTED NOT DETECTED Final   Klebsiella pneumoniae NOT DETECTED NOT DETECTED Final   Proteus species NOT DETECTED NOT DETECTED Final   Serratia marcescens NOT DETECTED NOT DETECTED Final   Haemophilus influenzae NOT DETECTED NOT DETECTED Final   Neisseria meningitidis NOT  DETECTED NOT DETECTED Final   Pseudomonas aeruginosa NOT DETECTED NOT DETECTED Final   Candida albicans NOT DETECTED NOT DETECTED Final   Candida glabrata NOT DETECTED NOT DETECTED Final   Candida krusei NOT DETECTED NOT DETECTED Final   Candida parapsilosis NOT DETECTED NOT DETECTED Final   Candida tropicalis NOT DETECTED NOT DETECTED Final    Comment: Performed at [facility], [address] [place], [place] [address]  Blood culture (routine x 2)     Status: Abnormal   Collection  Time: 10/26/19  6:45 AM   Specimen: BLOOD RIGHT FOREARM  Result Value Ref Range Status   Specimen Description BLOOD RIGHT FOREARM  Final   Special Requests   Final    BOTTLES DRAWN AEROBIC AND ANAEROBIC Blood Culture adequate volume   Culture  Setup Time   Final    IN BOTH AEROBIC AND ANAEROBIC BOTTLES GRAM POSITIVE COCCI CRITICAL VALUE NOTED.  VALUE IS CONSISTENT WITH PREVIOUSLY REPORTED AND CALLED VALUE.    Culture (A)  Final    STAPHYLOCOCCUS AUREUS SUSCEPTIBILITIES PERFORMED ON PREVIOUS CULTURE WITHIN THE LAST 5 DAYS. Performed at [facility], [address], [place], [place] [address]    Report Status 10/29/2019 FINAL  Final  SARS CORONAVIRUS 2 (TAT 6-24 HRS) Nasopharyngeal Nasopharyngeal Swab     Status: None   Collection Time: 10/26/19  6:55 AM   Specimen: Nasopharyngeal Swab  Result Value Ref Range Status   SARS Coronavirus 2 NEGATIVE NEGATIVE Final    Comment: (NOTE) SARS-CoV-2 target nucleic acids are NOT DETECTED. The SARS-CoV-2 RNA is generally detectable in upper and lower respiratory specimens during the acute phase of infection. Negative results do not preclude SARS-CoV-2 infection, do not rule out co-infections with other pathogens, and should not be used as the sole basis for treatment or other patient management decisions. Negative results must be combined with clinical observations, patient history, and epidemiological information. The expected result is Negative.  Fact Sheet for Patients: HairSlick.no Fact Sheet for Healthcare Providers: quierodirigir.com This test is not yet approved or cleared by the [place] FDA and  has been authorized for detection and/or diagnosis of SARS-CoV-2 by FDA under an Emergency Use Authorization (EUA). This EUA will remain  in effect (meaning this test can be used) for the duration of the COVID-19 declaration under Section 56 4(b)(1) of the Act, 21 U.S.C. section 360bbb-3(b)(1), unless the authorization is terminated or revoked sooner. Performed at [facility], [address], [place], [place] [address]   Urine culture     Status: None   Collection Time: 10/26/19  9:10 AM   Specimen: Urine, Random  Result Value Ref Range Status   Specimen Description URINE, RANDOM  Final   Special Requests SITE NOT SPECIFIED  Final   Culture   Final    NO GROWTH Performed at [facility], [address], [place], [place] [address]    Report Status 10/27/2019 FINAL  Final  Culture, blood (single)     Status: None   Collection Time: 10/26/19  7:19 PM   Specimen: BLOOD LEFT HAND  Result Value Ref Range Status   Specimen Description BLOOD LEFT HAND  Final   Special Requests   Final    BOTTLES DRAWN AEROBIC ONLY Blood Culture adequate volume   Culture   Final    NO GROWTH 5 DAYS Performed at [facility], [address] [place], [place] [address]    Report Status 10/31/2019 FINAL  Final  Culture, blood (routine x 2)     Status: Abnormal   Collection Time: 10/27/19  8:35 PM   Specimen: BLOOD LEFT ARM  Result Value Ref Range Status   Specimen Description BLOOD LEFT ARM  Final   Special Requests   Final    BOTTLES DRAWN AEROBIC AND ANAEROBIC Blood Culture adequate volume   Culture  Setup Time   Final    AEROBIC BOTTLE ONLY GRAM POSITIVE COCCI CRITICAL VALUE NOTED.  VALUE IS CONSISTENT WITH PREVIOUSLY REPORTED  AND CALLED VALUE.    Culture (A)   Final    STAPHYLOCOCCUS AUREUS SUSCEPTIBILITIES PERFORMED ON PREVIOUS CULTURE WITHIN THE LAST 5 DAYS.    Report Status 10/31/2019 FINAL  Final  Culture, blood (routine x 2)     Status: None (Preliminary result)   Collection Time: 10/27/19  8:40 PM   Specimen: BLOOD LEFT HAND  Result Value Ref Range Status   Specimen Description BLOOD LEFT HAND  Final   Special Requests   Final    BOTTLES DRAWN AEROBIC AND ANAEROBIC Blood Culture adequate volume   Culture   Final    NO GROWTH 4 DAYS Performed at [facility], [address] [place], [place] [address]    Report Status PENDING  Incomplete  Culture, blood (routine x 2)     Status: None (Preliminary result)   Collection Time: 10/30/19  1:15 PM   Specimen: BLOOD RIGHT ARM  Result Value Ref Range Status   Specimen Description BLOOD RIGHT ARM  Final   Special Requests   Final    BOTTLES DRAWN AEROBIC AND ANAEROBIC Blood Culture results may not be optimal due to an inadequate volume of blood received in culture bottles   Culture   Final    NO GROWTH < 24 HOURS Performed at [facility], [address], [place], [place] [address]    Report Status PENDING  Incomplete  Culture, blood (routine x 2)     Status: None (Preliminary result)   Collection Time: 10/30/19  1:20 PM   Specimen: BLOOD  Result Value Ref Range Status   Specimen Description BLOOD LEFT ANTECUBITAL  Final   Special Requests   Final    BOTTLES DRAWN AEROBIC AND ANAEROBIC Blood Culture results may not be optimal due to an inadequate volume of blood received in culture bottles   Culture   Final    NO GROWTH < 24 HOURS Performed at [facility], [address], [place], [place] [address]    Report Status PENDING  Incomplete         Radiology Studies: No results found.      Scheduled Meds: . sodium chloride   Intravenous Once  . buprenorphine-naloxone  2 tablet Sublingual BID  . docusate sodium  100 mg Oral BID  . enoxaparin (LOVENOX)  injection  40 mg Subcutaneous Q24H  . nicotine  14 mg Transdermal Daily  . sodium chloride flush  3 mL Intravenous Q12H   Continuous Infusions: . vancomycin       LOS: 5 days    Time spent: 25 minutes     Dorcas Carrow, MD [facility] Pager [phone]

## 2019-11-01 ENCOUNTER — Telehealth: Payer: Self-pay

## 2019-11-01 ENCOUNTER — Encounter (HOSPITAL_COMMUNITY): Payer: Self-pay | Admitting: Internal Medicine

## 2019-11-01 LAB — CULTURE, BLOOD (ROUTINE X 2)
Culture: NO GROWTH
Special Requests: ADEQUATE

## 2019-11-01 NOTE — Telephone Encounter (Signed)
Needs appt

## 2019-11-01 NOTE — Telephone Encounter (Signed)
Pt. [name] Was on [place] transition of Care, recently in the hospital for sepsis, and IVDU, did you want the pt. To f/u here AVS says just to follow up with Primary doctor if needed.

## 2019-11-02 NOTE — Telephone Encounter (Signed)
LVM for pt to call back to the office . KH 

## 2019-11-02 NOTE — Telephone Encounter (Signed)
I called pt. To schedule apt. There was no answer and unable to LM. Will try again later.

## 2019-11-04 LAB — CULTURE, BLOOD (ROUTINE X 2)
Culture: NO GROWTH
Culture: NO GROWTH

## 2019-11-08 NOTE — Discharge Summary (Signed)
Patient with history of depression, polysubstance abuse and injectable drug use who was admitted to the hospital with fever, tachycardia and hypotension.  She was found to have multifocal cavitary lesions on her lung CAT scan, blood cultures growing MRSA, she had generalized skin rashes.  She also had low hemoglobin and had received 1 unit of blood. Patient was admitted to the hospital, she was treated for MRSA bacteremia, multifocal cavitary pneumonia, TEE showed saggy elongated tricuspid valve endocarditis, she was receiving Suboxone for her injectable drug use issues.  While she was still needing inpatient treatment, she decided to walk out of the hospital.  Patient was in her mental capacity to make her decisions, extensive counseling done by bedside nurses and myself to stay back and finished her antibiotic course or at least finished 2 weeks of therapy, patient adamantly refused knowing all problems associated with early termination of treatment including worsening of disease, more infections, widespread body infection that can even lead to death.  Since she was capable of making her healthcare decisions, she left AGAINST MEDICAL ADVICE. Though suboptimal treatment, per patient's best interest, oral antibiotic, Zyvox was called in to her pharmacy for 8 weeks by infectious disease specialist.

## 2020-02-06 ENCOUNTER — Ambulatory Visit: Payer: Medicaid Other | Admitting: Family Medicine

## 2020-02-07 ENCOUNTER — Ambulatory Visit: Payer: Medicaid Other | Admitting: Family Medicine

## 2020-02-08 ENCOUNTER — Ambulatory Visit: Payer: Medicaid Other | Admitting: Family Medicine

## 2020-02-08 ENCOUNTER — Encounter: Payer: Self-pay | Admitting: Family Medicine

## 2020-05-11 ENCOUNTER — Emergency Department (HOSPITAL_COMMUNITY): Payer: Medicaid Other

## 2020-05-11 ENCOUNTER — Inpatient Hospital Stay (HOSPITAL_COMMUNITY)
Admission: EM | Admit: 2020-05-11 | Discharge: 2020-05-14 | DRG: 871 | Discharge status: Against Medical Advice | Payer: Medicaid Other | Attending: Internal Medicine | Admitting: Internal Medicine

## 2020-05-11 ENCOUNTER — Encounter (HOSPITAL_COMMUNITY): Payer: Self-pay

## 2020-05-11 ENCOUNTER — Other Ambulatory Visit: Payer: Self-pay

## 2020-05-11 DIAGNOSIS — Z915 Personal history of self-harm: Secondary | ICD-10-CM

## 2020-05-11 DIAGNOSIS — D649 Anemia, unspecified: Secondary | ICD-10-CM | POA: Diagnosis present

## 2020-05-11 DIAGNOSIS — Z888 Allergy status to other drugs, medicaments and biological substances status: Secondary | ICD-10-CM

## 2020-05-11 DIAGNOSIS — G9340 Encephalopathy, unspecified: Secondary | ICD-10-CM | POA: Diagnosis not present

## 2020-05-11 DIAGNOSIS — F17210 Nicotine dependence, cigarettes, uncomplicated: Secondary | ICD-10-CM | POA: Diagnosis present

## 2020-05-11 DIAGNOSIS — E872 Acidosis: Secondary | ICD-10-CM | POA: Diagnosis present

## 2020-05-11 DIAGNOSIS — Z6822 Body mass index (BMI) 22.0-22.9, adult: Secondary | ICD-10-CM | POA: Diagnosis not present

## 2020-05-11 DIAGNOSIS — R64 Cachexia: Secondary | ICD-10-CM | POA: Diagnosis present

## 2020-05-11 DIAGNOSIS — Z8349 Family history of other endocrine, nutritional and metabolic diseases: Secondary | ICD-10-CM

## 2020-05-11 DIAGNOSIS — I808 Phlebitis and thrombophlebitis of other sites: Secondary | ICD-10-CM | POA: Diagnosis present

## 2020-05-11 DIAGNOSIS — L03113 Cellulitis of right upper limb: Secondary | ICD-10-CM | POA: Diagnosis present

## 2020-05-11 DIAGNOSIS — Z821 Family history of blindness and visual loss: Secondary | ICD-10-CM | POA: Diagnosis not present

## 2020-05-11 DIAGNOSIS — R7881 Bacteremia: Secondary | ICD-10-CM | POA: Diagnosis not present

## 2020-05-11 DIAGNOSIS — I079 Rheumatic tricuspid valve disease, unspecified: Secondary | ICD-10-CM | POA: Diagnosis present

## 2020-05-11 DIAGNOSIS — Z5329 Procedure and treatment not carried out because of patient's decision for other reasons: Secondary | ICD-10-CM | POA: Diagnosis present

## 2020-05-11 DIAGNOSIS — A400 Sepsis due to streptococcus, group A: Secondary | ICD-10-CM | POA: Diagnosis present

## 2020-05-11 DIAGNOSIS — D696 Thrombocytopenia, unspecified: Secondary | ICD-10-CM | POA: Diagnosis present

## 2020-05-11 DIAGNOSIS — G9341 Metabolic encephalopathy: Secondary | ICD-10-CM | POA: Diagnosis present

## 2020-05-11 DIAGNOSIS — A419 Sepsis, unspecified organism: Secondary | ICD-10-CM | POA: Diagnosis present

## 2020-05-11 DIAGNOSIS — B955 Unspecified streptococcus as the cause of diseases classified elsewhere: Secondary | ICD-10-CM | POA: Diagnosis not present

## 2020-05-11 DIAGNOSIS — F1990 Other psychoactive substance use, unspecified, uncomplicated: Secondary | ICD-10-CM

## 2020-05-11 DIAGNOSIS — Z818 Family history of other mental and behavioral disorders: Secondary | ICD-10-CM | POA: Diagnosis not present

## 2020-05-11 DIAGNOSIS — R0602 Shortness of breath: Secondary | ICD-10-CM

## 2020-05-11 DIAGNOSIS — E876 Hypokalemia: Secondary | ICD-10-CM | POA: Diagnosis present

## 2020-05-11 DIAGNOSIS — Z20822 Contact with and (suspected) exposure to covid-19: Secondary | ICD-10-CM | POA: Diagnosis present

## 2020-05-11 DIAGNOSIS — R6521 Severe sepsis with septic shock: Secondary | ICD-10-CM | POA: Diagnosis present

## 2020-05-11 DIAGNOSIS — E871 Hypo-osmolality and hyponatremia: Secondary | ICD-10-CM | POA: Diagnosis present

## 2020-05-11 DIAGNOSIS — Z833 Family history of diabetes mellitus: Secondary | ICD-10-CM

## 2020-05-11 DIAGNOSIS — I330 Acute and subacute infective endocarditis: Secondary | ICD-10-CM | POA: Diagnosis not present

## 2020-05-11 DIAGNOSIS — E162 Hypoglycemia, unspecified: Secondary | ICD-10-CM | POA: Diagnosis present

## 2020-05-11 DIAGNOSIS — F1910 Other psychoactive substance abuse, uncomplicated: Secondary | ICD-10-CM | POA: Diagnosis present

## 2020-05-11 DIAGNOSIS — Z8249 Family history of ischemic heart disease and other diseases of the circulatory system: Secondary | ICD-10-CM

## 2020-05-11 DIAGNOSIS — Z809 Family history of malignant neoplasm, unspecified: Secondary | ICD-10-CM

## 2020-05-11 DIAGNOSIS — E440 Moderate protein-calorie malnutrition: Secondary | ICD-10-CM | POA: Insufficient documentation

## 2020-05-11 LAB — URINALYSIS, ROUTINE W REFLEX MICROSCOPIC
Bilirubin Urine: NEGATIVE
Glucose, UA: NEGATIVE mg/dL
Hgb urine dipstick: NEGATIVE
Ketones, ur: NEGATIVE mg/dL
Nitrite: NEGATIVE
Protein, ur: 30 mg/dL — AB
Specific Gravity, Urine: 1.004 — ABNORMAL LOW (ref 1.005–1.030)
pH: 6 (ref 5.0–8.0)

## 2020-05-11 LAB — BLOOD GAS, VENOUS
Acid-base deficit: 15.7 mmol/L — ABNORMAL HIGH (ref 0.0–2.0)
Bicarbonate: 10 mmol/L — ABNORMAL LOW (ref 20.0–28.0)
O2 Saturation: 48.5 %
Patient temperature: 98.6
pCO2, Ven: 22.5 mmHg — ABNORMAL LOW (ref 44.0–60.0)
pH, Ven: 7.27 (ref 7.250–7.430)
pO2, Ven: 32.4 mmHg (ref 32.0–45.0)

## 2020-05-11 LAB — COMPREHENSIVE METABOLIC PANEL
ALT: 11 U/L (ref 0–44)
AST: 10 U/L — ABNORMAL LOW (ref 15–41)
Albumin: <1 g/dL — ABNORMAL LOW (ref 3.5–5.0)
Alkaline Phosphatase: 26 U/L — ABNORMAL LOW (ref 38–126)
Anion gap: 18 — ABNORMAL HIGH (ref 5–15)
BUN: 6 mg/dL (ref 6–20)
CO2: 9 mmol/L — ABNORMAL LOW (ref 22–32)
Calcium: 6.1 mg/dL — CL (ref 8.9–10.3)
Chloride: 104 mmol/L (ref 98–111)
Creatinine, Ser: <0.3 mg/dL — ABNORMAL LOW (ref 0.44–1.00)
Glucose, Bld: 28 mg/dL — CL (ref 70–99)
Potassium: 3.9 mmol/L (ref 3.5–5.1)
Sodium: 131 mmol/L — ABNORMAL LOW (ref 135–145)
Total Bilirubin: 0.4 mg/dL (ref 0.3–1.2)
Total Protein: <3 g/dL — ABNORMAL LOW (ref 6.5–8.1)

## 2020-05-11 LAB — BASIC METABOLIC PANEL
Anion gap: 15 (ref 5–15)
BUN: 8 mg/dL (ref 6–20)
CO2: 13 mmol/L — ABNORMAL LOW (ref 22–32)
Calcium: 6.2 mg/dL — CL (ref 8.9–10.3)
Chloride: 99 mmol/L (ref 98–111)
Creatinine, Ser: 0.35 mg/dL — ABNORMAL LOW (ref 0.44–1.00)
GFR calc Af Amer: >60 mL/min (ref >60)
GFR calc non Af Amer: >60 mL/min (ref >60)
Glucose, Bld: 52 mg/dL — ABNORMAL LOW (ref 70–99)
Potassium: 3.8 mmol/L (ref 3.5–5.1)
Sodium: 127 mmol/L — ABNORMAL LOW (ref 135–145)

## 2020-05-11 LAB — CBC WITH DIFFERENTIAL/PLATELET
Abs Immature Granulocytes: 0.07 10*3/uL (ref 0.00–0.07)
Basophils Absolute: 0 10*3/uL (ref 0.0–0.1)
Basophils Relative: 0 %
Eosinophils Absolute: 0 10*3/uL (ref 0.0–0.5)
Eosinophils Relative: 0 %
HCT: 17 % — ABNORMAL LOW (ref 36.0–46.0)
Hemoglobin: 5.1 g/dL — CL (ref 12.0–15.0)
Immature Granulocytes: 1 %
Lymphocytes Relative: 4 %
Lymphs Abs: 0.3 10*3/uL — ABNORMAL LOW (ref 0.7–4.0)
MCH: 29 pg (ref 26.0–34.0)
MCHC: 30 g/dL (ref 30.0–36.0)
MCV: 96.6 fL (ref 80.0–100.0)
Monocytes Absolute: 0.5 10*3/uL (ref 0.1–1.0)
Monocytes Relative: 7 %
Neutro Abs: 6.3 10*3/uL (ref 1.7–7.7)
Neutrophils Relative %: 88 %
Platelets: 133 10*3/uL — ABNORMAL LOW (ref 150–400)
RBC: 1.76 MIL/uL — ABNORMAL LOW (ref 3.87–5.11)
RDW: 13.3 % (ref 11.5–15.5)
WBC: 7.2 10*3/uL (ref 4.0–10.5)
nRBC: 0 % (ref 0.0–0.2)

## 2020-05-11 LAB — CBC
HCT: 38.5 % (ref 36.0–46.0)
Hemoglobin: 12.4 g/dL (ref 12.0–15.0)
MCH: 30 pg (ref 26.0–34.0)
MCHC: 32.2 g/dL (ref 30.0–36.0)
MCV: 93.2 fL (ref 80.0–100.0)
Platelets: 226 10*3/uL (ref 150–400)
RBC: 4.13 MIL/uL (ref 3.87–5.11)
RDW: 13.6 % (ref 11.5–15.5)
WBC: 13.6 10*3/uL — ABNORMAL HIGH (ref 4.0–10.5)
nRBC: 0 % (ref 0.0–0.2)

## 2020-05-11 LAB — APTT: aPTT: 51 seconds — ABNORMAL HIGH (ref 24–36)

## 2020-05-11 LAB — PROTIME-INR
INR: 2.1 — ABNORMAL HIGH (ref 0.8–1.2)
INR: 1.1 (ref 0.8–1.2)
Prothrombin Time: 22.4 seconds — ABNORMAL HIGH (ref 11.4–15.2)
Prothrombin Time: 14.2 seconds (ref 11.4–15.2)

## 2020-05-11 LAB — IRON AND TIBC
Iron: 6 ug/dL — ABNORMAL LOW (ref 28–170)
Saturation Ratios: 6 % — ABNORMAL LOW (ref 10.4–31.8)
TIBC: 97 ug/dL — ABNORMAL LOW (ref 250–450)
UIBC: 91 ug/dL

## 2020-05-11 LAB — HCG, QUANTITATIVE, PREGNANCY: hCG, Beta Chain, Quant, S: <1 m[IU]/mL (ref <5)

## 2020-05-11 LAB — FOLATE: Folate: 1.4 ng/mL — ABNORMAL LOW (ref >5.9)

## 2020-05-11 LAB — RETICULOCYTES
Immature Retic Fract: 10.3 % (ref 2.3–15.9)
RBC.: 1.52 MIL/uL — ABNORMAL LOW (ref 3.87–5.11)
Retic Count, Absolute: 21 10*3/uL (ref 19.0–186.0)
Retic Ct Pct: 1.4 % (ref 0.4–3.1)

## 2020-05-11 LAB — SARS CORONAVIRUS 2 BY RT PCR (HOSPITAL ORDER, PERFORMED IN ~~LOC~~ HOSPITAL LAB): SARS Coronavirus 2: NEGATIVE

## 2020-05-11 LAB — LACTIC ACID, PLASMA
Lactic Acid, Venous: >11 mmol/L (ref 0.5–1.9)
Lactic Acid, Venous: 1.9 mmol/L (ref 0.5–1.9)
Lactic Acid, Venous: 1.9 mmol/L (ref 0.5–1.9)
Lactic Acid, Venous: 1 mmol/L (ref 0.5–1.9)

## 2020-05-11 LAB — PREPARE RBC (CROSSMATCH)

## 2020-05-11 LAB — CBG MONITORING, ED
Glucose-Capillary: 107 mg/dL — ABNORMAL HIGH (ref 70–99)
Glucose-Capillary: 91 mg/dL (ref 70–99)
Glucose-Capillary: 66 mg/dL — ABNORMAL LOW (ref 70–99)

## 2020-05-11 LAB — VITAMIN B12: Vitamin B-12: 99 pg/mL — ABNORMAL LOW (ref 180–914)

## 2020-05-11 LAB — LACTATE DEHYDROGENASE: LDH: 145 U/L (ref 98–192)

## 2020-05-11 LAB — FERRITIN: Ferritin: 111 ng/mL (ref 11–307)

## 2020-05-11 MED ORDER — IOHEXOL 300 MG/ML  SOLN
100.0000 mL | Freq: Once | INTRAMUSCULAR | Status: AC | PRN
  Administered 2020-05-11: 100 mL via INTRAVENOUS

## 2020-05-11 MED ORDER — PIPERACILLIN-TAZOBACTAM 3.375 G IVPB 30 MIN
3.3750 g | Freq: Once | INTRAVENOUS | Status: AC
  Administered 2020-05-11: 3.375 g via INTRAVENOUS
  Filled 2020-05-11: qty 50

## 2020-05-11 MED ORDER — POLYETHYLENE GLYCOL 3350 17 G PO PACK: 17.0000 g | PACK | Freq: Every day | ORAL | Status: DC | PRN

## 2020-05-11 MED ORDER — NOREPINEPHRINE 4 MG/250ML-% IV SOLN
0.0000 ug/min | INTRAVENOUS | Status: DC
  Administered 2020-05-11: 2.5 ug/min via INTRAVENOUS
  Filled 2020-05-11: qty 250

## 2020-05-11 MED ORDER — CALCIUM GLUCONATE-NACL 1-0.675 GM/50ML-% IV SOLN
1.0000 g | Freq: Once | INTRAVENOUS | Status: AC
  Administered 2020-05-11: 1000 mg via INTRAVENOUS
  Filled 2020-05-11: qty 50

## 2020-05-11 MED ORDER — LACTATED RINGERS IV BOLUS
1000.0000 mL | Freq: Once | INTRAVENOUS | Status: AC
  Administered 2020-05-11: 1000 mL via INTRAVENOUS

## 2020-05-11 MED ORDER — SODIUM CHLORIDE (PF) 0.9 % IJ SOLN
INTRAMUSCULAR | Status: AC
  Filled 2020-05-11: qty 50

## 2020-05-11 MED ORDER — ONDANSETRON HCL 4 MG/2ML IJ SOLN
4.0000 mg | Freq: Four times a day (QID) | INTRAMUSCULAR | Status: DC | PRN
  Administered 2020-05-13: 4 mg via INTRAVENOUS
  Filled 2020-05-11: qty 2

## 2020-05-11 MED ORDER — DEXTROSE 50 % IV SOLN
1.0000 | Freq: Once | INTRAVENOUS | Status: DC
  Filled 2020-05-11: qty 50

## 2020-05-11 MED ORDER — SODIUM CHLORIDE 0.9 % IV SOLN
10.0000 mL/h | Freq: Once | INTRAVENOUS | Status: AC
  Administered 2020-05-11: 10 mL/h via INTRAVENOUS

## 2020-05-11 MED ORDER — SODIUM CHLORIDE 0.9 % IV SOLN
2.0000 g | Freq: Once | INTRAVENOUS | Status: AC
  Administered 2020-05-11: 2 g via INTRAVENOUS
  Filled 2020-05-11: qty 20

## 2020-05-11 MED ORDER — DOCUSATE SODIUM 100 MG PO CAPS: 100.0000 mg | ORAL_CAPSULE | Freq: Two times a day (BID) | ORAL | Status: DC | PRN

## 2020-05-11 MED ORDER — LACTATED RINGERS IV BOLUS (SEPSIS)
1000.0000 mL | Freq: Once | INTRAVENOUS | Status: AC
  Administered 2020-05-11: 1000 mL via INTRAVENOUS

## 2020-05-11 MED ORDER — VANCOMYCIN HCL 500 MG/100ML IV SOLN
500.0000 mg | Freq: Two times a day (BID) | INTRAVENOUS | Status: DC
  Administered 2020-05-12 (×2): 500 mg via INTRAVENOUS
  Filled 2020-05-11 (×2): qty 100

## 2020-05-11 MED ORDER — SODIUM CHLORIDE 0.9 % IV SOLN
2.0000 g | Freq: Three times a day (TID) | INTRAVENOUS | Status: DC
  Administered 2020-05-11 – 2020-05-12 (×2): 2 g via INTRAVENOUS
  Filled 2020-05-11 (×2): qty 2

## 2020-05-11 MED ORDER — VANCOMYCIN HCL IN DEXTROSE 1-5 GM/200ML-% IV SOLN
1000.0000 mg | Freq: Once | INTRAVENOUS | Status: AC
  Administered 2020-05-11: 1000 mg via INTRAVENOUS
  Filled 2020-05-11: qty 200

## 2020-05-11 MED ORDER — DEXTROSE 50 % IV SOLN
INTRAVENOUS | Status: AC
  Filled 2020-05-11: qty 50

## 2020-05-11 MED ORDER — LACTATED RINGERS IV BOLUS (SEPSIS)
500.0000 mL | Freq: Once | INTRAVENOUS | Status: AC
  Administered 2020-05-11: 500 mL via INTRAVENOUS

## 2020-05-11 NOTE — H&P (Signed)
NAME:  [name], MRN:  [id number], DOB:  Dec 02, 1991, LOS: 0 ADMISSION DATE:  05/11/2020, CONSULTATION DATE:  05/11/20 REFERRING:  PA-C Albrizze, CHIEF COMPLAINT:  Septic shock  Brief History   H/o IVDU, arm cellulitis vs abscess, septic shock, severe anemia\ H/o MRSA tricuspid endocarditis in 10/2019  History of present illness   [name] is a 29 y/o woman with a history of MRSA tricuspid endocarditis and IVDU who presents with arm pain x 5 days. History is obtained mostly by her father at bedside. Her boyfriend called him overnight suggesting that he bring her to the hospital since the boyfriend was unable to convince her to come. She has had arm redness and soreness that has been worsening over 5 days. Her father thinks she had been doing well with her drug use but has recently started using again intermittently. He said her face breaks out more when she uses frequently and her skin looks healthy on her face currently. She took a xanax this morning around 3AM, but otherwise has not taken anything today. She admits to use methamphetamine, most recently yesterday morning. She has been sleepy, but arouses to stimulation. When she arrived at the ED she was hypotensive, tachycardic. She received 3L LR, ceftriaxone and vancomycin, and was started on norepinephrine. Hb 5.5; 2 units pRBCs ordered and verbally consented for blood (father signed consent).  Past Medical History  Tricuspid endocarditis- MRSA  Significant Hospital Events     Consults:  ID  Procedures:    Significant Diagnostic Tests:  CT forearm- septic thrombophlebitis median cubital vein, no fluid collections or free air  Micro Data:  5/15 blood >>   Antimicrobials:  vanc 5/15>> Ceftriaxone 5/15 Cefepime 5/15>>  Interim history/subjective:    Objective   Blood pressure (!) 104/58, pulse 92, temperature 98.2 F (36.8 C), temperature source Oral, resp. rate (!) 22, height 5' (1.524 m), weight 47.6 kg, last  menstrual period 05/04/2020, SpO2 100 %.        Intake/Output Summary (Last 24 hours) at 05/11/2020 1317 Last data filed at 05/11/2020 1227 Gross per 24 hour  Intake 1800 ml  Output --  Net 1800 ml   Filed Weights   05/11/20 0928 05/11/20 1218  Weight: 47.5 kg 47.6 kg    Examination: General: ill- appearing woman laying in bed in NAD HENT: [place]/AT, eyes anicteric, oral mucosa dry Lungs: CTAB, breathing comfortably on RA Cardiovascular: tachycardic, regular rhythm, no murmurs Abdomen: soft, NT, ND Extremities: RUE erythema, warmth, induration around [facility] and proximal medial forearm.  Neuro: sleeping, arousable to verbal and physical stimulation, will follow 1 step commands and fall back asleep Derm: multiple circular scabs on ankles, scratches from puppy on arms, legs, chest  Resolved Hospital Problem list     Assessment & Plan:  Septic shock- septic thrombophlebitis and likely bacteremia from IV drug use.  High risk for endocarditis. -Resuscitated in ED with 3 L crystalloid -Escalating antibiotics to cefepime and vancomycin; discussed with ID -Echocardiogram -Blood cultures pending -Norepinephrine as required to maintain MAP greater than 65 -CT reviewed; no drainable source  Acute on chronic anemia, no history of bleeding patient's anemia. -Agree with 2 units pRBC transfusion; verbally consented the patient and her father.  Father signed consent in epic. -Follow-up CBC after transfusion -Holding chemical DVT prophylaxis given concern for bleeding -Monitor for signs of bleeding -coags  Thrombocytopenia -Check INR, LDH due to concern for risk for DIC -Continue to monitor  Acute metabolic encephalopathy due to sepsis, nonfocal  exam -High risk for intubation -Con't to monitor  Lactic acidosis, resolved with volume resuscitation -Follow-up BMP  Hypocalcemia -Repleted  Hyponatremia, likely due to poor p.o. intake. -Continue to monitor -Maintain  euvolemia  Hypoglycemia -Every 4 Accu-Cheks; D50W as needed (1 dose in ED at arrival)  History of polysubstance abuse-benzos, methamphetamine, heroin -Monitor for signs of withdrawal  Best practice:  Diet: NPO Pain/Anxiety/Delirium protocol (if indicated): n/a VAP protocol (if indicated): n/a DVT prophylaxis: SCDs GI prophylaxis: n/a Glucose control: accuchecks Mobility: bedrest Code Status: full Family Communication: father updated at bedside (primary contact) Disposition: ICU  Labs   CBC: Recent Labs  Lab 05/11/20 1053  WBC 7.2  NEUTROABS 6.3  HGB 5.1*  HCT 17.0*  MCV 96.6  PLT 133*    Basic Metabolic Panel: Recent Labs  Lab 05/11/20 1053  NA 131*  K 3.9  CL 104  CO2 9*  GLUCOSE 28*  BUN 6  CREATININE <0.30*  CALCIUM 6.1*   GFR: CrCl cannot be calculated (This lab value cannot be used to calculate CrCl because it is not a number: <0.30). Recent Labs  Lab 05/11/20 1053  WBC 7.2  LATICACIDVEN >11.0*    Liver Function Tests: Recent Labs  Lab 05/11/20 1053  AST 10*  ALT 11  ALKPHOS 26*  BILITOT 0.4  PROT <3.0*  ALBUMIN <1.0*   No results for input(s): LIPASE, AMYLASE in the last 168 hours. No results for input(s): AMMONIA in the last 168 hours.  ABG No results found for: PHART, PCO2ART, PO2ART, HCO3, TCO2, ACIDBASEDEF, O2SAT   Coagulation Profile: Recent Labs  Lab 05/11/20 1053  INR 2.1*    Cardiac Enzymes: No results for input(s): CKTOTAL, CKMB, CKMBINDEX, TROPONINI in the last 168 hours.  HbA1C: No results found for: HGBA1C  CBG: Recent Labs  Lab 05/11/20 1203  GLUCAP 107*    Review of Systems:   Limited due to patient's mental status.  Endorses arm pain and fatigue, otherwise denies complaints.  Past Medical History  She,  has a past medical history of History of RPR test (07/21/2016), History of self-harm, Opioid abuse ([place]), and Polysubstance abuse ([place]).   Surgical History    Past Surgical History:  Procedure  Laterality Date  . BUBBLE STUDY  10/30/2019   Procedure: BUBBLE STUDY;  Surgeon: Fay Records, MD;  Location: [facility];  Service: Cardiovascular;;  . TEE WITHOUT CARDIOVERSION N/A 10/30/2019   Procedure: TRANSESOPHAGEAL ECHOCARDIOGRAM (TEE);  Surgeon: Fay Records, MD;  Location: [facility] ENDOSCOPY;  Service: Cardiovascular;  Laterality: N/A;  . TONSILLECTOMY       Social History   reports that she has been smoking cigarettes. She has been smoking about 0.50 packs per day. She has never used smokeless tobacco. She reports current drug use. Frequency: 5.00 times per week. Drugs: Cocaine, Marijuana, and Methamphetamines. She reports that she does not drink alcohol.   Family History   Her family history includes Allergies in her father, paternal grandfather, and paternal grandmother; Arthritis in her paternal grandmother; Cancer in her maternal grandfather and paternal grandmother; Depression in her mother; Diabetes in her maternal grandmother; Gallbladder disease in her maternal grandmother, mother, and paternal grandmother; Heart disease in her maternal grandfather; Mental illness in her mother; Thyroid disease in her mother; Ulcers in her maternal grandmother and mother; Vision loss in her father, maternal grandfather, maternal grandmother, mother, paternal grandfather, and paternal grandmother.   Allergies Allergies  Allergen Reactions  . Dextromethorphan Swelling     Home Medications  Prior  to Admission medications   Medication Sig Start Date End Date Taking? Authorizing Provider  gabapentin (NEURONTIN) 400 MG capsule Take 400 mg by mouth 3 (three) times daily.  10/26/19  [provider]  mirtazapine (REMERON) 30 MG tablet Take 30 mg by mouth at bedtime.  10/26/19  [provider]  risperiDONE (RISPERDAL) 1 MG tablet Take 1 tablet (1 mg total) by mouth at bedtime. Patient not taking: Reported on 09/16/2018 07/13/18 10/26/19  Charm Rings, NP  traZODone (DESYREL) 50 MG  tablet Take 1 tablet (50 mg total) by mouth at bedtime as needed for sleep. Patient not taking: Reported on 09/16/2018 07/13/18 10/26/19  Charm Rings, NP    This patient is critically ill with multiple organ system failure which requires frequent high complexity decision making, assessment, support, evaluation, and titration of therapies. This was completed through the application of advanced monitoring technologies and extensive interpretation of multiple databases. During this encounter critical care time was devoted to patient care services described in this note for 45 minutes.   Steffanie Dunn, DO 05/11/20 7:23 PM [facility]

## 2020-05-11 NOTE — Progress Notes (Signed)
Notified provider of need to order repeat lactic acid. ° °

## 2020-05-11 NOTE — ED Triage Notes (Signed)
Pt presents with c/o swelling and redness to her right arm. Pt reports she believes she has an abscess to that area from shooting up heroin. Pt does have significant swelling to that area as well as redness. Pt reports her last heroin use was last night around 6 pm last night and she also took a Xanax to help her sleep. Pt is able to answer all questions but is falling asleep during the triage.

## 2020-05-11 NOTE — Progress Notes (Signed)
A consult was received from an ED physician for Vancomycin per pharmacy dosing.  The patient's profile has been reviewed for ht/wt/allergies/indication/available labs.    A one time order has been placed for Vancomycin 1gm.  Further antibiotics/pharmacy consults should be ordered by admitting physician if indicated.                       Thank you,  Otho Bellows PharmD 05/11/2020  9:31 AM

## 2020-05-11 NOTE — ED Notes (Addendum)
Date and time results received: 05/11/20 8:18 PM  (use smartphrase ".now" to insert current time)  Test: Calcium Critical Value: 6.2  Name of Provider Notified: Dr.Summer  Orders Received? Or Actions Taken?

## 2020-05-11 NOTE — ED Notes (Signed)
Failed attempt to collect [facility]. Rn has been notified.

## 2020-05-11 NOTE — Progress Notes (Signed)
Notified bedside nurse of need to draw and administer antibiotics, lactic acid and blood cultures.  Message sent to bedside RN asking if there were any barriers preventing the start of the sepsis. Beside Rn replied back that patient is a difficult stick and they are having issues obtaining blood and several RNs were working to obtain.

## 2020-05-11 NOTE — ED Provider Notes (Addendum)
[facility]-EMERGENCY DEPT Provider Note   CSN: [id number] Arrival date & time: 05/11/20  0855     History Chief Complaint  Patient presents with  . Arm Swelling    [name] is a 29 y.o. female past medical history significant for opioid abuse and polysubstance abuse, septic emboli, endocarditis presents to emergency department today with chief complaint of progressively worsening pain and redness to her right arm x 4 days.  Patient states she last injected heroin into her right arm at 6 PM last night.  She also admits to taking a Xanax last night to help her sleep.  She is describing the pain is constant aching sensation.  She states she is unable to straighten her right arm because of the pain and swelling.  She rates the pain 10 of 10 in severity.  She also endorses subjective fever and chills, fatigue and anorexia.  She had not taken any over-the-counter medications for symptoms prior to arrival. She denies any cough, hemoptysis, chest pain, shortness of breath, back pain, abdominal pain, urinary symptoms diarrhea, numbness or tingling in right upper extremity.     Past Medical History:  Diagnosis Date  . History of RPR test 07/21/2016   02/2016: 1:1 with negative TPA testing [ ]  f/u 28wks [ ]  f/u admit RPR [ ]  f/u PP RPR  . History of self-harm   . Opioid abuse (HCC)   . Polysubstance abuse [facility](HCC)     Patient Active Problem List   Diagnosis Date Noted  . 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 Iron deficiency anemia due to chronic blood loss 10/29/2019  . MRSA bacteremia 10/27/2019  . Injection of illicit drug within last 12 months 10/27/2019  . Sepsis (HCC) 10/26/2019  . Septic embolism (HCC) 10/26/2019  . Cavitary pneumonia 10/26/2019  . 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 Substance induced mood disorder (HCC) 04/19/2018  . Polysubstance (including opioids) dependence, daily use (HCC) 02/03/2018  . Polysubstance dependence including opioid type  drug without complication, episodic abuse (HCC) 12/16/2017  . 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 Normal labor 10/16/2016  . Cigarette smoker 08/18/2016  . Pregnancy complicated by subutex maintenance, antepartum (HCC) 08/04/2016  . Hepatitis C, chronic, maternal, antepartum (HCC) 08/04/2016  . Supervision of high-risk pregnancy 07/21/2016  . ASCUS with positive high risk HPV 07/21/2016    Past Surgical History:  Procedure Laterality Date  . BUBBLE STUDY  10/30/2019   Procedure: BUBBLE STUDY;  Surgeon: Pricilla Riffleoss, Paula V, MD;  [facility];  Service: Cardiovascular;;  . TEE WITHOUT CARDIOVERSION N/A 10/30/2019   Procedure: TRANSESOPHAGEAL ECHOCARDIOGRAM (TEE);  Surgeon: Pricilla Riffleoss, Paula V, MD;  [facility];  Service: Cardiovascular;  Laterality: N/A;  . TONSILLECTOMY       OB History    Gravida  2   Para  2   Term  2   Preterm      AB      Living  2     SAB      TAB      Ectopic      Multiple  0   Live Births  2           Family History  Problem Relation Age of Onset  . Vision loss Mother   . Depression Mother   . Ulcers Mother   . Gallbladder disease Mother   . Mental illness Mother   . Thyroid disease Mother   .  Allergies Father   . Vision loss Father   . Vision loss Maternal Grandmother   . Ulcers Maternal Grandmother   . Gallbladder disease Maternal Grandmother   . Diabetes Maternal Grandmother   . Vision loss Maternal Grandfather   . Heart disease Maternal Grandfather   . Cancer Maternal Grandfather   . Allergies Paternal Grandmother   . Vision loss Paternal Grandmother   . Gallbladder disease Paternal Grandmother   . Arthritis Paternal Grandmother   . Cancer Paternal Grandmother   . Allergies Paternal Grandfather   . Vision loss Paternal Grandfather     Social History   Tobacco Use  . Smoking status: Current Every Day Smoker    Packs/day: 0.50    Types: Cigarettes  .  Smokeless tobacco: Never Used  Substance Use Topics  . Alcohol use: No  . Drug use: Yes    Frequency: 5.0 times per week    Types: Cocaine, Marijuana, Methamphetamines    Comment: Heroin    Home Medications Prior to Admission medications   Medication Sig Start Date End Date Taking? Authorizing Provider  gabapentin (NEURONTIN) 400 MG capsule Take 400 mg by mouth 3 (three) times daily.  10/26/19  [provider]  mirtazapine (REMERON) 30 MG tablet Take 30 mg by mouth at bedtime.  10/26/19  [provider]  risperiDONE (RISPERDAL) 1 MG tablet Take 1 tablet (1 mg total) by mouth at bedtime. Patient not taking: Reported on 09/16/2018 07/13/18 10/26/19  Charm Rings, NP  traZODone (DESYREL) 50 MG tablet Take 1 tablet (50 mg total) by mouth at bedtime as needed for sleep. Patient not taking: Reported on 09/16/2018 07/13/18 10/26/19  Charm Rings, NP    Allergies    Dextromethorphan  Review of Systems   Review of Systems  All other systems are reviewed and are negative for acute change except as noted in the HPI.   Physical Exam Updated Vital Signs BP (!) 90/59 (BP Location: Left Arm)   Pulse (!) 128   Temp 98.2 F (36.8 C) (Oral)   Resp 18   LMP 05/04/2020 (Approximate)   SpO2 100%   Physical Exam Vitals and nursing note reviewed.  Constitutional:      General: She is not in acute distress.    Appearance: She is ill-appearing.     Comments: Cachectic appearing female  HENT:     Head: Normocephalic and atraumatic.     Right Ear: Tympanic membrane and external ear normal.     Left Ear: Tympanic membrane and external ear normal.     Nose: Nose normal.     Mouth/Throat:     Mouth: Mucous membranes are moist.     Pharynx: Oropharynx is clear.  Eyes:     General: No scleral icterus.       Right eye: No discharge.        Left eye: No discharge.     Extraocular Movements: Extraocular movements intact.     Conjunctiva/sclera: Conjunctivae normal.      Pupils: Pupils are equal, round, and reactive to light.  Neck:     Vascular: No JVD.  Cardiovascular:     Rate and Rhythm: Regular rhythm. Tachycardia present.     Pulses: Normal pulses.          Radial pulses are 2+ on the right side and 2+ on the left side.     Heart sounds: Normal heart sounds.  Pulmonary:     Comments: Lungs clear to auscultation in  all fields. Symmetric chest rise. No wheezing, rales, or rhonchi. Abdominal:     Comments: Abdomen is soft, non-distended, and non-tender in all quadrants. No rigidity, no guarding. No peritoneal signs.  Musculoskeletal:     Cervical back: Normal range of motion.     Comments: Unable to fully extend right elbow secondary to pain.  Able to wiggle all fingers and she has strong equal grip strength in bilateral upper extremities  Full ROM of left upper extremity and bilateral lower extremities.  She ambulates with normal gait.  Skin:    General: Skin is warm and dry.     Capillary Refill: Capillary refill takes less than 2 seconds.     Comments: Scabs on all extremities. No rash on legs  Please see media below  Neurological:     Mental Status: She is oriented to person, place, and time.     GCS: GCS eye subscore is 4. GCS verbal subscore is 5. GCS motor subscore is 6.     Comments: Fluent speech, no facial droop.  Psychiatric:        Mood and Affect: Mood is anxious. Affect is tearful.        Behavior: Behavior normal.       ED Results / Procedures / Treatments   Labs (all labs ordered are listed, but only abnormal results are displayed) Labs Reviewed  LACTIC ACID, PLASMA - Abnormal; Notable for the following components:      Result Value   Lactic Acid, Venous >11.0 (*)    All other components within normal limits  COMPREHENSIVE METABOLIC PANEL - Abnormal; Notable for the following components:   Sodium 131 (*)    CO2 9 (*)    Glucose, Bld 28 (*)    Creatinine, Ser <0.30 (*)    Calcium 6.1 (*)    Total Protein <3.0 (*)     Albumin <1.0 (*)    AST 10 (*)    Alkaline Phosphatase 26 (*)    Anion gap 18 (*)    All other components within normal limits  CBC WITH DIFFERENTIAL/PLATELET - Abnormal; Notable for the following components:   RBC 1.76 (*)    Hemoglobin 5.1 (*)    HCT 17.0 (*)    Platelets 133 (*)    Lymphs Abs 0.3 (*)    All other components within normal limits  APTT - Abnormal; Notable for the following components:   aPTT 51 (*)    All other components within normal limits  PROTIME-INR - Abnormal; Notable for the following components:   Prothrombin Time 22.4 (*)    INR 2.1 (*)    All other components within normal limits  URINALYSIS, ROUTINE W REFLEX MICROSCOPIC - Abnormal; Notable for the following components:   APPearance HAZY (*)    Specific Gravity, Urine 1.004 (*)    Protein, ur 30 (*)    Leukocytes,Ua TRACE (*)    Bacteria, UA RARE (*)    All other components within normal limits  VITAMIN B12 - Abnormal; Notable for the following components:   Vitamin B-12 99 (*)    All other components within normal limits  FOLATE - Abnormal; Notable for the following components:   Folate 1.4 (*)    All other components within normal limits  IRON AND TIBC - Abnormal; Notable for the following components:   Iron 6 (*)    TIBC 97 (*)    Saturation Ratios 6 (*)    All other components within normal limits  RETICULOCYTES - Abnormal; Notable for the following components:   RBC. 1.52 (*)    All other components within normal limits  BLOOD GAS, VENOUS - Abnormal; Notable for the following components:   pCO2, Ven 22.5 (*)    Bicarbonate 10.0 (*)    Acid-base deficit 15.7 (*)    All other components within normal limits  CBG MONITORING, ED - Abnormal; Notable for the following components:   Glucose-Capillary 107 (*)    All other components within normal limits  SARS CORONAVIRUS 2 BY RT PCR (HOSPITAL ORDER, PERFORMED IN [facility])  CULTURE, BLOOD (ROUTINE X 2)  CULTURE, BLOOD (ROUTINE  X 2)  URINE CULTURE  FERRITIN  HCG, QUANTITATIVE, PREGNANCY  LACTIC ACID, PLASMA  LACTIC ACID, PLASMA  LACTIC ACID, PLASMA  PROTIME-INR  LACTATE DEHYDROGENASE  BASIC METABOLIC PANEL  LACTIC ACID, PLASMA  I-STAT BETA HCG BLOOD, ED ([facility], [facility], [facility] ONLY)  TYPE AND SCREEN  PREPARE RBC (CROSSMATCH)  ABO/RH    EKG None  Radiology CT FOREARM RIGHT W CONTRAST  Result Date: 05/11/2020 CLINICAL DATA:  Right forearm swelling. IV drug abuse. Evaluate for abscess EXAM: CT OF THE UPPER RIGHT EXTREMITY WITH CONTRAST TECHNIQUE: Multidetector CT imaging of the upper right extremity was performed according to the standard protocol following intravenous contrast administration. COMPARISON:  None. CONTRAST:  OMNIPAQUE IOHEXOL 300 MG/ML  SOLN FINDINGS: Bones/Joint/Cartilage No acute fracture. No dislocation. No significant arthropathy. No elbow joint effusion. No cortical destruction or periostitis. Ligaments Suboptimally assessed by CT. Muscles and Tendons Grossly intact myotendinous structures within the limitations of CT. Soft tissues Expanded appearance of the median cubital vein at the level of the antecubital fossa. Vein is peripherally enhancing with central low-density (series 3, image 37) with fat stranding in the surrounding subcutaneous fat. There is mild diffuse soft tissue edema. No organized or rim enhancing fluid collections. No soft tissue gas. No radiopaque foreign body. A portion of the liver is partially included on axial images. There is suggestion of pericholecystic fluid surrounding the gallbladder. IMPRESSION: 1. Findings suggestive of thrombophlebitis of the median cubital vein at the level of the antecubital fossa. 2. No organized or rim enhancing fluid collections. No soft tissue gas. 3. No acute osseous abnormality. 4. Partially included portion of the right hemiabdomen appears show pericholecystic fluid around the gallbladder. Further evaluation with right upper quadrant ultrasound  can be performed if there is concern for gallbladder inflammation. Electronically Signed   By: Duanne Guess D.O.   On: 05/11/2020 14:11   DG Chest Port 1 View  Result Date: 05/11/2020 CLINICAL DATA:  Sepsis. Complains of swelling and redness EXAM: PORTABLE CHEST 1 VIEW COMPARISON:  10/26/19 FINDINGS: The heart size and mediastinal contours are within normal limits. Interval resolution of previously noted cavitary lung nodules of septic emboli. The visualized skeletal structures are unremarkable. IMPRESSION: 1. No active cardiopulmonary abnormalities. 2. Interval resolution of previously noted cavitary lung nodules of septic emboli. No new findings. Electronically Signed   By: Signa Kell M.D.   On: 05/11/2020 10:37    Procedures .Critical Care Performed by: Sherene Sires, PA-C Authorized by: Sherene Sires, PA-C   Critical care provider statement:    Critical care time (minutes):  55   Critical care time was exclusive of:  Separately billable procedures and treating other patients and teaching time   Critical care was necessary to treat or prevent imminent or life-threatening deterioration of the following conditions:  Sepsis and shock (severe anemia requiring  transfusion)   Critical care was time spent personally by me on the following activities:  Development of treatment plan with patient or surrogate, discussions with consultants, evaluation of patient's response to treatment, examination of patient, obtaining history from patient or surrogate, ordering and performing treatments and interventions, ordering and review of laboratory studies, ordering and review of radiographic studies, pulse oximetry, re-evaluation of patient's condition and review of old charts   (including critical care time)  Medications Ordered in ED Medications  norepinephrine (LEVOPHED) 4mg  in 263mL premix infusion (3 mcg/min Intravenous Rate/Dose Change 05/11/20 1210)  piperacillin-tazobactam (ZOSYN)  IVPB 3.375 g (has no administration in time range)  0.9 %  sodium chloride infusion (has no administration in time range)  dextrose 50 % solution (has no administration in time range)  dextrose 50 % solution 50 mL (has no administration in time range)  vancomycin (VANCOCIN) IVPB 1000 mg/200 mL premix (0 mg Intravenous Stopped 05/11/20 1227)  cefTRIAXone (ROCEPHIN) 2 g in sodium chloride 0.9 % 100 mL IVPB (0 g Intravenous Stopped 05/11/20 1227)  lactated ringers bolus 1,000 mL (0 mLs Intravenous Stopped 05/11/20 1227)    And  lactated ringers bolus 500 mL (0 mLs Intravenous Stopped 05/11/20 1227)  lactated ringers bolus 1,000 mL (1,000 mLs Intravenous New Bag/Given 05/11/20 1230)    ED Course  I have reviewed the triage vital signs and the nursing notes.  Pertinent labs & imaging results that were available during my care of the patient were reviewed by me and considered in my medical decision making (see chart for details).  Clinical Course as of May 11 1300  Sat May 11, 2020  0932 Code sepsis initiated given patient's tachycardia and hypotension.  I checked blood pressure in the room and had multiple readings in the 93O and 67T systolic.Antibiotic ordered for cellulitis as well as 30 cc/kg LR   [KA]  1104 There was a delay in getting fluids started as patient is difficult IV stick. Using [place] RN was able to start labs and fluids are now infusing with medications. Most recent BP is 76/39. Patient is in trendelenburg position. Will closely monitor and recheck BP in 15 minutes   [KA]  1116 Patient evaluated at the bedside by ED attending. Pressures are not yet responding to fluids. Levophed ordered.    [KA]  1151 Called blood bank.  They are able to run type and screen off CBC so emergent blood release is not needed at this time.  Planning to transfuse 2 units   [KA]  1301 Patient has received 3 L of LR and is currently on 3 mcg of Levophed   [KA]    Clinical Course User Index [KA] Cherre Robins, PA-C   Vitals:   05/11/20 1155 05/11/20 1200 05/11/20 1215 05/11/20 1218  BP: 105/84 112/82 97/70   Pulse: 73 91 97   Resp: 20 16 16    Temp:      TempSrc:      SpO2: 100% 100% 100%   Weight:    47.6 kg  Height:    5' (1.524 m)    MDM Rules/Calculators/A&P                       History provided by patient with additional history obtained from chart review.  Patient was admitted to the hospital service and was found to have multifocal cavitary lesions on her CT scan, blood cultures growing MRSA and low hemoglobin requiring blood transfusion, TEE showing  endocarditis.  Patient ultimately left AGAINST MEDICAL ADVICE.  Patient seen and examined. Patient presents awake, alert.  Patient is afebrile however is tachycardic and hypotensive.  I rechecked patient's blood pressure in the room and had multiple readings with systolic in the 80s and 90s.  On exam she has large area of erythema to right forearm.  She is unable to fully extend right elbow.  Radial pulse is 2+ bilaterally.  Skin on right forearm is erythematous, edematous and warm to the touch.  There is a small area of scab without any purulent drainage.  There is no induration or palpable fluctuance.  There is concern for septic arthritis. Will order imaging. Patient does have strong equal grip strength in bilateral upper extremities.  Code sepsis initiated with antibiotic started for cellulitis as well as 30 cc/kg LR for fluid resuscitation.  There was a delay starting fluids and antibiotics because patient is difficult stick. ED attending Dr. Dalene Seltzer evaluated patient and agrees with plan to start levophed as her hypotension is worsening and BP is not yet responding to IVF.  When rechecked bloos pressure and MAP had improved with Levophed.  Labs show CBC without leukocytosis, hemoglobin is 5.1, thrombocytopenia with platelet count of 133.  Type and screen and anemia panel ordered.  However given critical ill patient is will  proceed with emergent release of 2 units.  CMP with hypoeglycemia of 28 hypocalcemia 6.1, creatinine <.3, elevated anion gap of 18. CBG was checked and glucose is 107, will hold off on amp of 50 and recheck glucose.Lactic acidosis is <11.0.  INR and PTT also elevated. Pregnancy test is negative. Blood cultures are pending. Second lactic acid is 1.9. Chest xray viewed by me no acute infectious processes. Radiologist comments on interval resolution of cavitary lung nodules of septic emboli.   Attempted to wean off levophed however pressure dropped. Will continue.  CT ordered of right forearm to look for deep space infection. CT shows thrombophlebitis of the median cubital vein at the level of the antecubital fossa.  Radiologist also saw pericholecystic fluid around the gallbladder. Will defer to critical care fo further management.  Case discussed with intensivist Dr. Chestine Spore who agrees to assume care of patient and bring into the hospital for further evaluation and management for sepsis shock.  This has been shared ED visit with ED attending Dr. Dalene Seltzer. She personally saw and evaluated the patient.   Portions of this note were generated with Scientist, clinical (histocompatibility and immunogenetics). Dictation errors may occur despite best attempts at proofreading.    Final Clinical Impression(s) / ED Diagnoses Final diagnoses:  Septic shock [facility])    Rx / DC Orders ED Discharge Orders    None       Sherene Sires, PA-C 05/11/20 1522    Ashlynd Michna, Caroleen Hamman, PA-C 05/11/20 1643    Alvira Monday, MD 05/11/20 2234

## 2020-05-11 NOTE — Progress Notes (Signed)
eLink Physician-Brief Progress Note Patient Name: KETZIA GUZEK DOB: 1991-02-25 MRN: [id number]   Date of Service  05/11/2020  HPI/Events of Note  Hypocalcemia - Ca++ = 6.2 which corrects to 8.68 (slightly low) given albumin < 1.0.   eICU Interventions  Will replace Ca++.      Intervention Category Major Interventions: Electrolyte abnormality - evaluation and management  Jaivon Vanbeek Eugene 05/11/2020, 8:26 PM

## 2020-05-11 NOTE — ED Notes (Signed)
CRITICAL VALUE STICKER  CRITICAL VALUE: Hemoglobin 5.1  RECEIVER (on-site recipient of call):  DATE & TIME NOTIFIED: Today Now  MESSENGER (representative from lab):  MD NOTIFIED: Scholaaman MD  TIME OF NOTIFICATION:  RESPONSE: Noted

## 2020-05-11 NOTE — ED Notes (Signed)
Per Thayer Ohm RN I stat beta tube was sent down to lab. Order placed to run HCG quan.

## 2020-05-11 NOTE — Progress Notes (Signed)
A consult was received from an ED provider for piperacillin/tazobactam per pharmacy dosing.  The patient's profile has been reviewed for ht/wt/allergies/indication/available labs.    Allergies  Allergen Reactions  . Dextromethorphan Swelling    A one time order has been placed for piperacillin/tazobactam 3.375 g IV once over 30 minutes.    Further antibiotics/pharmacy consults should be ordered by admitting physician if indicated.                       Thank you, Cindi Carbon, PharmD 05/11/2020  11:38 AM

## 2020-05-11 NOTE — Progress Notes (Signed)
Pharmacy Antibiotic Note  [name] is a 29 y.o. female admitted on 05/11/2020 with probable bacteremia.  Pharmacy has been consulted for Vancomycin & Cefepime dosing.  Plan: Vanc 1gm x1, then 500mg  q12 - based on previous levels last November, peak 19 on same dose Cefepime 2gm q8   Height: 5' (152.4 cm) Weight: 47.6 kg (105 lb) IBW/kg (Calculated) : 45.5  Temp (24hrs), Avg:98.2 F (36.8 C), Min:98.2 F (36.8 C), Max:98.2 F (36.8 C)  Recent Labs  Lab 05/11/20 1053 05/11/20 1318 05/11/20 1322  WBC 7.2  --   --   CREATININE <0.30*  --   --   LATICACIDVEN >11.0* 1.9 1.9    CrCl cannot be calculated (This lab value cannot be used to calculate CrCl because it is not a number: <0.30).    Allergies  Allergen Reactions  . Dextromethorphan Swelling    Antimicrobials this admission: 5/15 Rocephin & Zosyn x1 5/15 Vancomycin >> 5/15 Cefepime >>  Dose adjustments this admission:  Microbiology results: 5/15 BCx 1set: sent 5/15 UCx: sent    Thank you for allowing pharmacy to be a part of this patient's care.  6/15 PharmD 05/11/2020 2:56 PM

## 2020-05-12 ENCOUNTER — Encounter (HOSPITAL_COMMUNITY): Payer: Self-pay | Admitting: Critical Care Medicine

## 2020-05-12 ENCOUNTER — Inpatient Hospital Stay (HOSPITAL_COMMUNITY): Payer: Medicaid Other

## 2020-05-12 DIAGNOSIS — B955 Unspecified streptococcus as the cause of diseases classified elsewhere: Secondary | ICD-10-CM

## 2020-05-12 DIAGNOSIS — A400 Sepsis due to streptococcus, group A: Principal | ICD-10-CM

## 2020-05-12 DIAGNOSIS — E876 Hypokalemia: Secondary | ICD-10-CM

## 2020-05-12 DIAGNOSIS — R6521 Severe sepsis with septic shock: Secondary | ICD-10-CM

## 2020-05-12 DIAGNOSIS — R7881 Bacteremia: Secondary | ICD-10-CM

## 2020-05-12 DIAGNOSIS — I330 Acute and subacute infective endocarditis: Secondary | ICD-10-CM

## 2020-05-12 LAB — CBG MONITORING, ED: Glucose-Capillary: 82 mg/dL (ref 70–99)

## 2020-05-12 LAB — COMPREHENSIVE METABOLIC PANEL
ALT: 26 U/L (ref 0–44)
AST: 28 U/L (ref 15–41)
Albumin: 2.2 g/dL — ABNORMAL LOW (ref 3.5–5.0)
Alkaline Phosphatase: 90 U/L (ref 38–126)
Anion gap: 9 (ref 5–15)
BUN: 11 mg/dL (ref 6–20)
CO2: 23 mmol/L (ref 22–32)
Calcium: 7.9 mg/dL — ABNORMAL LOW (ref 8.9–10.3)
Chloride: 105 mmol/L (ref 98–111)
Creatinine, Ser: 0.74 mg/dL (ref 0.44–1.00)
GFR calc Af Amer: >60 mL/min (ref >60)
GFR calc non Af Amer: >60 mL/min (ref >60)
Glucose, Bld: 86 mg/dL (ref 70–99)
Potassium: 3.4 mmol/L — ABNORMAL LOW (ref 3.5–5.1)
Sodium: 137 mmol/L (ref 135–145)
Total Bilirubin: 1.8 mg/dL — ABNORMAL HIGH (ref 0.3–1.2)
Total Protein: 5.2 g/dL — ABNORMAL LOW (ref 6.5–8.1)

## 2020-05-12 LAB — CBC
HCT: 39.3 % (ref 36.0–46.0)
Hemoglobin: 12.8 g/dL (ref 12.0–15.0)
MCH: 30.1 pg (ref 26.0–34.0)
MCHC: 32.6 g/dL (ref 30.0–36.0)
MCV: 92.5 fL (ref 80.0–100.0)
Platelets: 229 10*3/uL (ref 150–400)
RBC: 4.25 MIL/uL (ref 3.87–5.11)
RDW: 13.5 % (ref 11.5–15.5)
WBC: 12.5 10*3/uL — ABNORMAL HIGH (ref 4.0–10.5)
nRBC: 0 % (ref 0.0–0.2)

## 2020-05-12 LAB — ECHOCARDIOGRAM COMPLETE
Height: 60 in
Weight: 1827.17 oz

## 2020-05-12 LAB — BLOOD CULTURE ID PANEL (REFLEXED)

## 2020-05-12 LAB — GLUCOSE, CAPILLARY
Glucose-Capillary: 91 mg/dL (ref 70–99)
Glucose-Capillary: 95 mg/dL (ref 70–99)
Glucose-Capillary: 72 mg/dL (ref 70–99)
Glucose-Capillary: 130 mg/dL — ABNORMAL HIGH (ref 70–99)
Glucose-Capillary: 74 mg/dL (ref 70–99)

## 2020-05-12 LAB — URINE CULTURE: Culture: NO GROWTH

## 2020-05-12 LAB — PHOSPHORUS: Phosphorus: 2.4 mg/dL — ABNORMAL LOW (ref 2.5–4.6)

## 2020-05-12 LAB — ABO/RH: ABO/RH(D): A POS

## 2020-05-12 LAB — MAGNESIUM: Magnesium: 1.6 mg/dL — ABNORMAL LOW (ref 1.7–2.4)

## 2020-05-12 MED ORDER — DIPHENHYDRAMINE HCL 50 MG PO CAPS
50.0000 mg | ORAL_CAPSULE | Freq: Every evening | ORAL | Status: DC | PRN
  Administered 2020-05-12: 50 mg via ORAL
  Filled 2020-05-12: qty 1

## 2020-05-12 MED ORDER — MAGNESIUM SULFATE 2 GM/50ML IV SOLN
2.0000 g | Freq: Once | INTRAVENOUS | Status: DC
  Filled 2020-05-12: qty 50

## 2020-05-12 MED ORDER — METHADONE HCL 10 MG/ML PO CONC: 10.0000 mg | Freq: Every day | ORAL | Status: DC

## 2020-05-12 MED ORDER — POTASSIUM CHLORIDE CRYS ER 20 MEQ PO TBCR
40.0000 meq | EXTENDED_RELEASE_TABLET | Freq: Once | ORAL | Status: AC
  Administered 2020-05-12: 40 meq via ORAL
  Filled 2020-05-12: qty 2

## 2020-05-12 MED ORDER — METHADONE HCL 10 MG/ML PO CONC
10.0000 mg | Freq: Every day | ORAL | Status: DC
  Administered 2020-05-12 – 2020-05-13 (×2): 10 mg via ORAL
  Filled 2020-05-12 (×5): qty 1

## 2020-05-12 MED ORDER — SODIUM CHLORIDE 0.9 % IV SOLN
250.0000 mL | INTRAVENOUS | Status: DC
  Administered 2020-05-12: 250 mL via INTRAVENOUS

## 2020-05-12 MED ORDER — MAGNESIUM SULFATE 4 GM/100ML IV SOLN
4.0000 g | Freq: Once | INTRAVENOUS | Status: AC
  Administered 2020-05-12: 4 g via INTRAVENOUS
  Filled 2020-05-12: qty 100

## 2020-05-12 MED ORDER — ACETAMINOPHEN 500 MG PO TABS
500.0000 mg | ORAL_TABLET | Freq: Four times a day (QID) | ORAL | Status: DC | PRN
  Administered 2020-05-12 – 2020-05-13 (×3): 500 mg via ORAL
  Filled 2020-05-12 (×3): qty 1

## 2020-05-12 MED ORDER — CLINDAMYCIN PHOSPHATE 600 MG/50ML IV SOLN
600.0000 mg | Freq: Three times a day (TID) | INTRAVENOUS | Status: AC
  Administered 2020-05-12 – 2020-05-13 (×5): 600 mg via INTRAVENOUS
  Filled 2020-05-12 (×5): qty 50

## 2020-05-12 MED ORDER — ENOXAPARIN SODIUM 40 MG/0.4ML ~~LOC~~ SOLN
40.0000 mg | SUBCUTANEOUS | Status: DC
  Administered 2020-05-12: 40 mg via SUBCUTANEOUS
  Filled 2020-05-12: qty 0.4

## 2020-05-12 MED ORDER — NOREPINEPHRINE 4 MG/250ML-% IV SOLN: 2.0000 ug/min | INTRAVENOUS | Status: DC

## 2020-05-12 MED ORDER — CHLORHEXIDINE GLUCONATE CLOTH 2 % EX PADS
6.0000 | MEDICATED_PAD | Freq: Every day | CUTANEOUS | Status: DC
  Administered 2020-05-12: 6 via TOPICAL

## 2020-05-12 MED ORDER — PENICILLIN G POTASSIUM 20000000 UNITS IJ SOLR
4.0000 10*6.[IU] | INTRAVENOUS | Status: DC
  Administered 2020-05-12 – 2020-05-13 (×5): 4 10*6.[IU] via INTRAVENOUS
  Filled 2020-05-12 (×8): qty 4

## 2020-05-12 MED ORDER — LORAZEPAM 2 MG/ML IJ SOLN
INTRAMUSCULAR | Status: AC
  Administered 2020-05-12: 1 mg via INTRAVENOUS
  Filled 2020-05-12: qty 1

## 2020-05-12 MED ORDER — METHADONE HCL 10 MG/ML PO CONC
10.0000 mg | Freq: Every day | ORAL | Status: DC
  Filled 2020-05-12: qty 1

## 2020-05-12 MED ORDER — LORAZEPAM 2 MG/ML IJ SOLN: 1.0000 mg | Freq: Once | INTRAMUSCULAR | Status: AC

## 2020-05-12 MED ORDER — POTASSIUM & SODIUM PHOSPHATES 280-160-250 MG PO PACK
2.0000 | PACK | Freq: Once | ORAL | Status: AC
  Administered 2020-05-12: 2 via ORAL
  Filled 2020-05-12: qty 2

## 2020-05-12 NOTE — Progress Notes (Signed)
  Echocardiogram 2D Echocardiogram has been performed.  [name] 05/12/2020, 9:31 AM

## 2020-05-12 NOTE — Plan of Care (Signed)
Drug paraphernalia found in the room. No visitors. Security to be notified.  Steffanie Dunn, DO 05/12/20 5:01 PM [facility]

## 2020-05-12 NOTE — Progress Notes (Signed)
Electrolyte replacement protocol, Mg replaced.

## 2020-05-12 NOTE — Progress Notes (Signed)
Called to room by patient whom states she is freezing. Patient noted to be shaking uncontrollably and sweating. Oral temp 97.7 Dr. Kemper Durie on unit and was notified new orders received.

## 2020-05-12 NOTE — Progress Notes (Signed)
Rectal temp 104.0. Dr. Chestine Spore notified

## 2020-05-12 NOTE — Progress Notes (Addendum)
NAME:  [name], MRN:  [id number], DOB:  [birth year], LOS: 1 ADMISSION DATE:  05/11/2020, CONSULTATION DATE:  05/11/20 REFERRING:  PA-C Albrizze, CHIEF COMPLAINT:  Septic shock  Brief History   H/o IVDU, arm cellulitis vs abscess, septic shock, severe anemia\ H/o MRSA tricuspid endocarditis in 10/2019  History of present illness   [name] is a 29 y/o woman with a history of MRSA tricuspid endocarditis and IVDU who presents with arm pain x 5 days. History is obtained mostly by her father at bedside. Her boyfriend called him overnight suggesting that he bring her to the hospital since the boyfriend was unable to convince her to come. She has had arm redness and soreness that has been worsening over 5 days. Her father thinks she had been doing well with her drug use but has recently started using again intermittently. He said her face breaks out more when she uses frequently and her skin looks healthy on her face currently. She took a xanax this morning around 3AM, but otherwise has not taken anything today. She admits to use methamphetamine, most recently yesterday morning. She has been sleepy, but arouses to stimulation. When she arrived at the ED she was hypotensive, tachycardic. She received 3L LR, ceftriaxone and vancomycin, and was started on norepinephrine. Hb 5.5; 2 units pRBCs ordered and verbally consented for blood (father signed consent).  Past Medical History  Tricuspid endocarditis- MRSA IVDU  Significant Hospital Events     Consults:  ID  Procedures:    Significant Diagnostic Tests:  CT forearm- septic thrombophlebitis median cubital vein, no fluid collections or free air  Echocardiogram 5/16-LVEF 60 to 65%, normal diastolic function.  Normal RV.  Abnormal tricuspid valve with persistent vegetation on septal and lateral leaflets.  Trivial TR.  Micro Data:  5/15 blood (1-site) >> GPC, strep pyogenes 5/16 blood cx>>  Antimicrobials:  vanc 5/15>> 5/16 Ceftriaxone  5/15 Cefepime 5/15>> 5/16 Penicillin G 5/16>> Clindamycin 5/16>>  Interim history/subjective:  Feeling much improved today. Arm hurts less. Wants to try subutex instead of methadone.   Objective   Blood pressure (!) 128/45, pulse 86, temperature 98.3 F (36.8 C), temperature source Oral, resp. rate (!) 24, height 5' (1.524 m), weight 51.8 kg, last menstrual period 05/04/2020, SpO2 98 %.        Intake/Output Summary (Last 24 hours) at 05/12/2020 1107 Last data filed at 05/12/2020 0655 Gross per 24 hour  Intake 4654.7 ml  Output 2400 ml  Net 2254.7 ml   Filed Weights   05/11/20 0928 05/11/20 1218 05/12/20 0157  Weight: 47.5 kg 47.6 kg 51.8 kg    Examination: General: chronically ill appearing young woman laying in bed in NAD HENT: [place]/AT, eyes anicteric Lungs: breathing comfortably on RA, CTAB Cardiovascular: RRR, faint diastolic murmur on LSB Abdomen: soft, NT, ND Extremities: improved erythema on RUE, especially tracking up into upper arm. Neuro: sleeping but arouses easily, speaking in full sentences. Much more alert. Derm: no rashes, scabs on legs and arms  Resolved Hospital Problem list     Assessment & Plan:  Septic shock- septic thrombophlebitis, bacteremia, and likely tricuspid from IV drug use.  High risk for endocarditis.  -De-escalating antibiotics to PCN G and clindamycin based on cultures -Echocardiogram reviewed -repeat  blood cultures today -Norepinephrine as required to maintain MAP greater than 65 -she declined the idea of TEE. She wants to go home in a few days on PO antibiotics. -monitor for signs of embolism  Acute on chronic anemia,  no history of bleeding patient's anemia.  Overcorrected for 2 units--> question if yesterday's sample was diluted -Agree with 2 units pRBC transfusion; verbally consented the patient and her father.  Father signed consent in epic. -Con't to monitor  -starting DVT prophylaxis  Thrombocytopenia-resolved; question if  diluted sample yesterday -Con't to monitor  Acute metabolic encephalopathy -resolved -delirium precautions  Lactic acidosis, resolved with volume resuscitation  Hypokalemia, hypomagnesemia -Repleted   Hyponatremia, resolved -Con't to monitor -Maintain euvolemia  Hyperbilirubinemia- possibly cholestasis from antibiotics. No evidence of hemolysis. CT of arm had suggested possible pericholecystic fluid -RUQ [place]  Hypoglycemia- resolved -Q4h accuchecks  History of polysubstance abuse-benzos, methamphetamine, heroin -Monitor for signs of withdrawal. Withdrawal protocol ordered -subutex not on formulate. Ok to con't methadone. -HIV test ordered  Best practice:  Diet: NPO Pain/Anxiety/Delirium protocol (if indicated): n/a VAP protocol (if indicated): n/a DVT prophylaxis: SCDs GI prophylaxis: n/a Glucose control: accuchecks Mobility: bedrest Code Status: full Family Communication: patient updated during exam Disposition: ICU  Labs   CBC: Recent Labs  Lab 05/11/20 1053 05/11/20 2238 05/12/20 0248  WBC 7.2 13.6* 12.5*  NEUTROABS 6.3  --   --   HGB 5.1* 12.4 12.8  HCT 17.0* 38.5 39.3  MCV 96.6 93.2 92.5  PLT 133* 226 093    Basic Metabolic Panel: Recent Labs  Lab 05/11/20 1053 05/11/20 1322 05/12/20 0248  NA 131* 127* 137  K 3.9 3.8 3.4*  CL 104 99 105  CO2 9* 13* 23  GLUCOSE 28* 52* 86  BUN 6 8 11   CREATININE <0.30* 0.35* 0.74  CALCIUM 6.1* 6.2* 7.9*  MG  --   --  1.6*  PHOS  --   --  2.4*   GFR: Estimated Creatinine Clearance: 73.9 mL/min (by C-G formula based on SCr of 0.74 mg/dL). Recent Labs  Lab 05/11/20 1053 05/11/20 1318 05/11/20 1322 05/11/20 1439 05/11/20 2238 05/12/20 0248  WBC 7.2  --   --   --  13.6* 12.5*  LATICACIDVEN >11.0* 1.9 1.9 1.0  --   --     Liver Function Tests: Recent Labs  Lab 05/11/20 1053 05/12/20 0248  AST 10* 28  ALT 11 26  ALKPHOS 26* 90  BILITOT 0.4 1.8*  PROT <3.0* 5.2*  ALBUMIN <1.0* 2.2*   No results  for input(s): LIPASE, AMYLASE in the last 168 hours. No results for input(s): AMMONIA in the last 168 hours.  ABG    Component Value Date/Time   HCO3 10.0 (L) 05/11/2020 1322   ACIDBASEDEF 15.7 (H) 05/11/2020 1322   O2SAT 48.5 05/11/2020 1322     Coagulation Profile: Recent Labs  Lab 05/11/20 1053 05/11/20 2238  INR 2.1* 1.1    Cardiac Enzymes: No results for input(s): CKTOTAL, CKMB, CKMBINDEX, TROPONINI in the last 168 hours.  HbA1C: No results found for: HGBA1C  CBG: Recent Labs  Lab 05/11/20 1601 05/11/20 2114 05/12/20 0121 05/12/20 0335 05/12/20 0758  GLUCAP 91 66* 82 91 Holly Jamaiyah Pyle, [name] 05/12/20 3:41 PM [facility]

## 2020-05-12 NOTE — Progress Notes (Signed)
eLink Physician-Brief Progress Note Patient Name: [name] DOB: 11-Mar-1991 MRN: [id number]   Date of Service  05/12/2020  HPI/Events of Note  Request for sleep med.  Patient appears to be resting at this time.  eICU Interventions  Benadryl 50 mg PO prn ordered     Intervention Category Minor Interventions: Routine modifications to care plan (e.g. PRN medications for pain, fever)  Rosalie Gums Adelaide Pfefferkorn 05/12/2020, 9:03 PM

## 2020-05-12 NOTE — Progress Notes (Signed)
Turned patient over in bed to check a rectal temperature and noticed  empty syringe  sticking out of a brown coin purse. Geophysicist/field seismologist, charge nurse, security and Dr. Chestine Spore. Syringe was removed from bed and   Thrown in sharps container and Coin purse placed in  patient belonging bag and placed in closet with the rest of patients personal items per security.

## 2020-05-12 NOTE — Progress Notes (Signed)
eLink Physician-Brief Progress Note Patient Name: [name] DOB: 12-21-1991 MRN: [id number]   Date of Service  05/12/2020  HPI/Events of Note  Multiple issues: 1. Patient has Norepinephrine via PIV with orders for central line infusion and 2. Patient says she is "dope sick". Hx of opioid IVDA.  Patient is able to tolerate water.   eICU Interventions  Will order: 1. Change Norepinephrine IV infusion orders to via PIV, 2. Methadone 10 mg po now and Q day. 3. Change diet to NPO except sips with meds and ice chips.      Intervention Category Major Interventions: Other:;Hypotension - evaluation and management  [name] Dennard Nip 05/12/2020, 2:21 AM

## 2020-05-12 NOTE — Progress Notes (Signed)
PHARMACY - PHYSICIAN COMMUNICATION CRITICAL VALUE ALERT - BLOOD CULTURE IDENTIFICATION (BCID)  [name] is an 29 y.o. female who presented to [facility] on 05/11/2020 with a chief complaint of R forearm redness, cellulitis related to Heroin use.   Hx admit 10/28-11/3 (2020) with cavitary lung lesions, MRSA bacteremia - TEE w/tricuspid endocarditis. Left AMA 11/3 > ID called Zyvox script x 8 wk to her pharmacy - unknown if taken  Assessment: 5/15 BCx - one set: resulted 5/16 both bottles with Strep pyogenes. Had required pressors, just discontinued this morning 5/16  Name of physician (or Provider) Contacted: Dr Chestine Spore  Current antibiotics: Vancomycin & Cefepime  Changes to prescribed antibiotics recommended: Pen G 4 million units q4hr, add Clindamycin 600mg  IV q8 with pressor use   Results for orders placed or performed during the hospital encounter of 05/11/20  Blood Culture ID Panel (Reflexed) (Collected: 05/11/2020 10:50 AM)  Result Value Ref Range   Enterococcus species NOT DETECTED NOT DETECTED   Listeria monocytogenes NOT DETECTED NOT DETECTED   Staphylococcus species NOT DETECTED NOT DETECTED   Staphylococcus aureus (BCID) NOT DETECTED NOT DETECTED   Streptococcus species DETECTED (A) NOT DETECTED   Streptococcus agalactiae NOT DETECTED NOT DETECTED   Streptococcus pneumoniae NOT DETECTED NOT DETECTED   Streptococcus pyogenes DETECTED (A) NOT DETECTED   Acinetobacter baumannii NOT DETECTED NOT DETECTED   Enterobacteriaceae species NOT DETECTED NOT DETECTED   Enterobacter cloacae complex NOT DETECTED NOT DETECTED   Escherichia coli NOT DETECTED NOT DETECTED   Klebsiella oxytoca NOT DETECTED NOT DETECTED   Klebsiella pneumoniae NOT DETECTED NOT DETECTED   Proteus species NOT DETECTED NOT DETECTED   Serratia marcescens NOT DETECTED NOT DETECTED   Haemophilus influenzae NOT DETECTED NOT DETECTED   Neisseria meningitidis NOT DETECTED NOT DETECTED   Pseudomonas aeruginosa  NOT DETECTED NOT DETECTED   Candida albicans NOT DETECTED NOT DETECTED   Candida glabrata NOT DETECTED NOT DETECTED   Candida krusei NOT DETECTED NOT DETECTED   Candida parapsilosis NOT DETECTED NOT DETECTED   Candida tropicalis NOT DETECTED NOT DETECTED    05/13/2020 PharmD 05/12/2020  9:29 AM

## 2020-05-13 DIAGNOSIS — E440 Moderate protein-calorie malnutrition: Secondary | ICD-10-CM | POA: Insufficient documentation

## 2020-05-13 LAB — BPAM RBC
Blood Product Expiration Date: 202106032359
Blood Product Expiration Date: 202106032359
Blood Product Expiration Date: 202106062359
Blood Product Expiration Date: 202106092359
ISSUE DATE / TIME: 202105151204
ISSUE DATE / TIME: 202105151204
ISSUE DATE / TIME: 202105151451
ISSUE DATE / TIME: 202105151815
Unit Type and Rh: 6200
Unit Type and Rh: 6200
Unit Type and Rh: 9500
Unit Type and Rh: 9500

## 2020-05-13 LAB — PHOSPHORUS: Phosphorus: 2.7 mg/dL (ref 2.5–4.6)

## 2020-05-13 LAB — TYPE AND SCREEN
ABO/RH(D): A POS
Antibody Screen: NEGATIVE
Unit division: 0
Unit division: 0
Unit division: 0
Unit division: 0

## 2020-05-13 LAB — COMPREHENSIVE METABOLIC PANEL
ALT: 33 U/L (ref 0–44)
AST: 41 U/L (ref 15–41)
Albumin: 2.2 g/dL — ABNORMAL LOW (ref 3.5–5.0)
Alkaline Phosphatase: 104 U/L (ref 38–126)
Anion gap: 8 (ref 5–15)
BUN: 7 mg/dL (ref 6–20)
CO2: 23 mmol/L (ref 22–32)
Calcium: 7.6 mg/dL — ABNORMAL LOW (ref 8.9–10.3)
Chloride: 106 mmol/L (ref 98–111)
Creatinine, Ser: 0.77 mg/dL (ref 0.44–1.00)
GFR calc Af Amer: >60 mL/min (ref >60)
GFR calc non Af Amer: >60 mL/min (ref >60)
Glucose, Bld: 108 mg/dL — ABNORMAL HIGH (ref 70–99)
Potassium: 3.4 mmol/L — ABNORMAL LOW (ref 3.5–5.1)
Sodium: 137 mmol/L (ref 135–145)
Total Bilirubin: 0.5 mg/dL (ref 0.3–1.2)
Total Protein: 5.3 g/dL — ABNORMAL LOW (ref 6.5–8.1)

## 2020-05-13 LAB — GLUCOSE, CAPILLARY
Glucose-Capillary: 88 mg/dL (ref 70–99)
Glucose-Capillary: 115 mg/dL — ABNORMAL HIGH (ref 70–99)

## 2020-05-13 LAB — MAGNESIUM: Magnesium: 1.9 mg/dL (ref 1.7–2.4)

## 2020-05-13 LAB — MRSA PCR SCREENING: MRSA by PCR: NEGATIVE

## 2020-05-13 LAB — CBC
HCT: 35.5 % — ABNORMAL LOW (ref 36.0–46.0)
Hemoglobin: 11.6 g/dL — ABNORMAL LOW (ref 12.0–15.0)
MCH: 29.7 pg (ref 26.0–34.0)
MCHC: 32.7 g/dL (ref 30.0–36.0)
MCV: 91 fL (ref 80.0–100.0)
Platelets: 213 10*3/uL (ref 150–400)
RBC: 3.9 MIL/uL (ref 3.87–5.11)
RDW: 14 % (ref 11.5–15.5)
WBC: 7.6 10*3/uL (ref 4.0–10.5)
nRBC: 0 % (ref 0.0–0.2)

## 2020-05-13 LAB — HIV ANTIBODY (ROUTINE TESTING W REFLEX): HIV Screen 4th Generation wRfx: NONREACTIVE

## 2020-05-13 MED ORDER — GABAPENTIN 100 MG PO CAPS
200.0000 mg | ORAL_CAPSULE | Freq: Two times a day (BID) | ORAL | Status: DC
  Administered 2020-05-13 – 2020-05-14 (×2): 200 mg via ORAL
  Filled 2020-05-13 (×2): qty 2

## 2020-05-13 MED ORDER — MELATONIN 3 MG PO TABS
3.0000 mg | ORAL_TABLET | Freq: Every evening | ORAL | Status: DC | PRN
  Administered 2020-05-13: 3 mg via ORAL
  Filled 2020-05-13: qty 1

## 2020-05-13 MED ORDER — BOOST / RESOURCE BREEZE PO LIQD CUSTOM
1.0000 | ORAL | Status: DC
  Administered 2020-05-14: 1 via ORAL

## 2020-05-13 MED ORDER — NICOTINE 21 MG/24HR TD PT24
21.0000 mg | MEDICATED_PATCH | Freq: Every day | TRANSDERMAL | Status: DC
  Administered 2020-05-13 – 2020-05-14 (×2): 21 mg via TRANSDERMAL
  Filled 2020-05-13 (×2): qty 1

## 2020-05-13 MED ORDER — PENICILLIN G POTASSIUM 20000000 UNITS IJ SOLR
12.0000 10*6.[IU] | Freq: Two times a day (BID) | INTRAVENOUS | Status: DC
  Administered 2020-05-13: 12 10*6.[IU] via INTRAVENOUS
  Filled 2020-05-13 (×2): qty 12

## 2020-05-13 MED ORDER — BUPRENORPHINE HCL-NALOXONE HCL 2-0.5 MG SL SUBL
1.0000 | SUBLINGUAL_TABLET | Freq: Every day | SUBLINGUAL | Status: DC
  Administered 2020-05-13 – 2020-05-14 (×2): 1 via SUBLINGUAL
  Filled 2020-05-13 (×2): qty 1

## 2020-05-13 MED ORDER — POTASSIUM CHLORIDE CRYS ER 20 MEQ PO TBCR: 40.0000 meq | EXTENDED_RELEASE_TABLET | Freq: Once | ORAL | Status: DC

## 2020-05-13 MED ORDER — MAGNESIUM SULFATE 2 GM/50ML IV SOLN: 2.0000 g | Freq: Once | INTRAVENOUS | Status: DC

## 2020-05-13 MED ORDER — PRO-STAT SUGAR FREE PO LIQD
30.0000 mL | Freq: Every day | ORAL | Status: DC
  Administered 2020-05-13: 30 mL via ORAL
  Filled 2020-05-13: qty 30

## 2020-05-13 MED ORDER — TAB-A-VITE/IRON PO TABS
1.0000 | ORAL_TABLET | Freq: Every day | ORAL | Status: DC
  Administered 2020-05-13: 1 via ORAL
  Filled 2020-05-13 (×2): qty 1

## 2020-05-13 NOTE — Progress Notes (Signed)
Initial Nutrition Assessment  DOCUMENTATION CODES:   Non-severe (moderate) malnutrition in context of social or environmental circumstances  INTERVENTION:  - will order Boost Breeze once/day, each supplement provides 250 kcal and 9 grams of protein. - will order Magic Cup BID with meals, each supplement provides 290 kcal and 9 grams of protein. - will order 30 mL Prostat BID, each supplement provides 100 kcal and 15 grams of protein. - will order 1 tablet multivitamin with minerals + iron/day.    NUTRITION DIAGNOSIS:   Moderate Malnutrition related to social / environmental circumstances as evidenced by mild fat depletion, mild muscle depletion, moderate muscle depletion.  GOAL:   Patient will meet greater than or equal to 90% of their needs  MONITOR:   PO intake, Supplement acceptance, Labs, Weight trends  REASON FOR ASSESSMENT:   Malnutrition Screening Tool  ASSESSMENT:   29 year-old female with medical history of MRSA, tricuspid endocarditis, and IVDU (heroin). She presented to the ED with 5 day hx of arm pain.  Patient was discussed in rounds this AM.   No intakes documented since admission. Patient reports decreased appetite for at least the past 1 week. She is R-handed and having some difficulty with eating and ADL d/t R arm pain and swelling in the 5 days PTA.   Patient with noted hx of IVDU. When she is using IV drugs, she prioritizes this over eating and can go >1 without eating anything.   Patient is unsure if she has lost any weight recently. Per chart review, weight yesterday was 114 lb, weight on 5/15 was 105 lb, and PTA the most recently documented weight was on 10/22/19 when she weighed 100 lb.   Per notes: - septic shock - persistent tricuspid valve endocarditis  - acute on chronic anemia - hyperbilirubinemia - hypoglycemia on admission - polysubstance abuse (benzos, methamphetamines, heroin) - pending HIV testing   Labs reviewed; CBG: 88 mg/dl, K:  3.4 mmol/l, Ca: 7.6 mg/dl. Medications reviewed.     NUTRITION - FOCUSED PHYSICAL EXAM:    Most Recent Value  Orbital Region  No depletion  Upper Arm Region  Mild depletion  Thoracic and Lumbar Region  Unable to assess  Buccal Region  Mild depletion  Temple Region  Mild depletion  Clavicle Bone Region  Moderate depletion  Clavicle and Acromion Bone Region  Moderate depletion  Scapular Bone Region  Unable to assess  Dorsal Hand  Mild depletion  Patellar Region  Unable to assess  Anterior Thigh Region  Unable to assess  Posterior Calf Region  Unable to assess  Edema (RD Assessment)  Unable to assess  Hair  Reviewed  Eyes  Reviewed  Mouth  Unable to assess  Skin  Reviewed  Nails  Reviewed       Diet Order:   Diet Order            Diet regular Room service appropriate? Yes; Fluid consistency: Thin  Diet effective now              EDUCATION NEEDS:   No education needs have been identified at this time  Skin:  Skin Assessment: Reviewed RN Assessment(R arm cellulitis)  Last BM:  5/15  Height:   Ht Readings from Last 1 Encounters:  05/12/20 5' (1.524 m)    Weight:   Wt Readings from Last 1 Encounters:  05/12/20 51.8 kg    Ideal Body Weight:     BMI:  Body mass index is 22.3 kg/m.  Estimated Nutritional  Needs:   Kcal:  1700-1900 kcal  Protein:  80-90 grams  Fluid:  >/= 1.8 L/day     Trenton Gammon, MS, RD, LDN, CNSC Inpatient Clinical Dietitian RD pager # available in [facility]  After hours/weekend pager # available in [facility]

## 2020-05-13 NOTE — Progress Notes (Signed)
eLink Physician-Brief Progress Note Patient Name: [name] DOB: 20-Nov-1991 MRN: [id number]   Date of Service  05/13/2020  HPI/Events of Note  Pt requesting medication for sleep.  eICU Interventions  Melatonin 3  Mg po Q HS PRN ordered.        [name] 05/13/2020, 9:17 PM

## 2020-05-13 NOTE — TOC Initial Note (Signed)
Transition of Care [facility]) - Initial/Assessment Note    Patient Details  Name: [name] MRN: [id number] Date of Birth: Dec 20, 1991  [facility] CM/SW Contact:    Golda Acre, RN Phone Number: 05/13/2020, 8:25 AM  Clinical Narrative:                 FRom home has pcp should be able to return home, admitted for sepsis,,tmep-104, Iv cleoc9in,levophed and pencillin g/ Bld cultures x2 + group a strep.  WBC-12.5  Expected Discharge Plan: Home/Self Care Barriers to Discharge: Continued Medical Work up   Patient Goals and CMS Choice Patient states their goals for this hospitalization and ongoing recovery are:: to go home CMS Medicare.gov Compare Post Acute Care list provided to:: Patient    Expected Discharge Plan and Services Expected Discharge Plan: Home/Self Care   Discharge Planning Services: CM Consult   Living arrangements for the past 2 months: Single Family Home                                      Prior Living Arrangements/Services Living arrangements for the past 2 months: Single Family Home Lives with:: Spouse, Significant Other Patient language and need for interpreter reviewed:: No Do you feel safe going back to the place where you live?: Yes      Need for Family Participation in Patient Care: Yes (Comment) Care giver support system in place?: Yes (comment)   Criminal Activity/Legal Involvement Pertinent to Current Situation/Hospitalization: No - Comment as needed  Activities of Daily Living Home Assistive Devices/Equipment: None ADL Screening (condition at time of admission) Patient's cognitive ability adequate to safely complete daily activities?: Yes Is the patient deaf or have difficulty hearing?: No Does the patient have difficulty seeing, even when wearing glasses/contacts?: No Does the patient have difficulty concentrating, remembering, or making decisions?: No Patient able to express need for assistance with ADLs?:  Yes Does the patient have difficulty dressing or bathing?: No Independently performs ADLs?: Yes (appropriate for developmental age) Does the patient have difficulty walking or climbing stairs?: No Weakness of Legs: None Weakness of Arms/Hands: None  Permission Sought/Granted                  Emotional Assessment Appearance:: Appears stated age     Orientation: : Oriented to Self, Oriented to Place, Oriented to  Time, Oriented to Situation Alcohol / Substance Use: Not Applicable Psych Involvement: No (comment)  Admission diagnosis:  Septic shock (HCC) [A41.9, R65.21] Patient Active Problem List   Diagnosis Date Noted  . Septic shock (HCC) 05/11/2020  . 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 Iron deficiency anemia due to chronic blood loss 10/29/2019  . MRSA bacteremia 10/27/2019  . Injection of illicit drug within last 12 months 10/27/2019  . Sepsis (HCC) 10/26/2019  . Septic embolism (HCC) 10/26/2019  . Cavitary pneumonia 10/26/2019  . 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 Substance induced mood disorder (HCC) 04/19/2018  . Polysubstance (including opioids) dependence, daily use (HCC) 02/03/2018  . Polysubstance dependence including opioid type drug without complication, episodic abuse (HCC) 12/16/2017  . 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 Normal labor 10/16/2016  . Cigarette smoker 08/18/2016  . Pregnancy complicated by subutex maintenance, antepartum (HCC) 08/04/2016  . Hepatitis C, chronic, maternal, antepartum (HCC) 08/04/2016  . Supervision of high-risk pregnancy 07/21/2016  . ASCUS with positive high  risk HPV 07/21/2016   PCP:  Denita Lung, MD Pharmacy:   [id number] - [address], [place] [address] Phone: [phone] Fax: [phone]  [id number] - [place], [place] [address] [place] [address] Phone: [phone] Fax: [phone]  [facility] [place] [place] - [address] [place] [address] Phone: [phone] Fax: [phone]     Social Determinants of Health (SDOH) Interventions    Readmission Risk Interventions No flowsheet data found.

## 2020-05-13 NOTE — Progress Notes (Signed)
Pt screaming for pain med, admin suboxone & APAP early. Pt screaming for something for sleep. Paged PCCM/elink. New order pending. Pt refusing VS & CBG at this time. Will attempt later in the shift.

## 2020-05-13 NOTE — Consult Note (Signed)
Telepsych Consultation   Reason for Consult:  "found with needle & syringe in her belongings suspected to be using IVD in the room" Referring Physician:  Dr. Isaiah Serge Location of Patient: [facility] [phone] Location of Provider: [facility]  Patient Identification: [name] MRN:  [id number] Principal Diagnosis: <principal problem not specified> Diagnosis:  Active Problems:   Septic shock (HCC)   Total Time spent with patient: 30 minutes  Subjective: Patient states "I am not really wanting to stop using, not right now." [name] is a 29 y.o. female patient.  Patient assessed by nurse practitioner.  Patient alert and oriented, answers appropriately. Patient denies suicidal ideations.  When discussing discovery of syringe and belongings patient reports "I was trying to get high." Patient denies suicidal ideations currently.  Patient reports 1 suicide attempt, approximately 2 years ago.  Patient denies history of self-harm behaviors.  Patient denies homicidal ideations.  Patient denies auditory and visual hallucinations.  Patient denies symptoms of paranoia. Patient reports she lives alone in [place].  Patient denies access to weapons.  Patient reports she is currently unemployed but attempting to find employment.  Patient reports feels safe at home and is supported by her father.  Patient reports occasional alcohol use.  Patient reports substance use.  Patient reports drug of choice is heroin times approximately 10 years and methamphetamine times approximately 2 years.  Patient reports appetite is "good."  Patient reports she sleeps approximately "4 to 8 hours per night." Patient currently does not report interest in substance use treatment. Patient reports "I am getting methadone but it is not working." Patient reports she has been seen by outpatient psychiatry at [facility] in the past.  Patient reports no current outpatient psychiatric provider.  Patient reports plan to  follow-up with outpatient psychiatry at [facility]. Patient prefers that we do not contact any friend or family for collateral information at this time.   Past Psychiatric History: Polysubstance dependence including opioid type drug, major depressive disorder, substance-induced mood disorder  Risk to Self:  Denies Risk to Others:  Denies Prior Inpatient Therapy:  : Behavioral health, [facility], Prior Outpatient Therapy:  [facility]  Past Medical History:  Past Medical History:  Diagnosis Date  . History of RPR test 07/21/2016   02/2016: 1:1 with negative TPA testing [ ]  f/u 28wks [ ]  f/u admit RPR [ ]  f/u PP RPR  . History of self-harm   . Opioid abuse (HCC)   . Polysubstance abuse [facility])     Past Surgical History:  Procedure Laterality Date  . BUBBLE STUDY  10/30/2019   Procedure: BUBBLE STUDY;  Surgeon: , MD;  Location: [facility] ENDOSCOPY;  Service: Cardiovascular;;  . TEE WITHOUT CARDIOVERSION N/A 10/30/2019   Procedure: TRANSESOPHAGEAL ECHOCARDIOGRAM (TEE);  Surgeon: Pricilla Riffle, MD;  Location: [facility] ENDOSCOPY;  Service: Cardiovascular;  Laterality: N/A;  . TONSILLECTOMY     Family History:  Family History  Problem Relation Age of Onset  . Vision loss Mother   . Depression Mother   . Ulcers Mother   . Gallbladder disease Mother   . Mental illness Mother   . Thyroid disease Mother   . Allergies Father   . Vision loss Father   . Vision loss Maternal Grandmother   . Ulcers Maternal Grandmother   . Gallbladder disease Maternal Grandmother   . Diabetes Maternal Grandmother   . Vision loss Maternal Grandfather   . Heart disease Maternal Grandfather   . Cancer  Maternal Grandfather   . Allergies Paternal Grandmother   . Vision loss Paternal Grandmother   . Gallbladder disease Paternal Grandmother   . Arthritis Paternal Grandmother   . Cancer Paternal Grandmother   . Allergies Paternal Grandfather   . Vision loss Paternal Grandfather     Family Psychiatric  History: Mother-depression, substance use disorder Social History:  Social History   Substance and Sexual Activity  Alcohol Use No     Social History   Substance and Sexual Activity  Drug Use Yes  . Frequency: 5.0 times per week  . Types: Cocaine, Marijuana, Methamphetamines   Comment: Heroin    Social History   Socioeconomic History  . Marital status: Single    Spouse name: Not on file  . Number of children: Not on file  . Years of education: Not on file  . Highest education level: Not on file  Occupational History  . Not on file  Tobacco Use  . Smoking status: Current Every Day Smoker    Packs/day: 0.50    Types: Cigarettes  . Smokeless tobacco: Never Used  Substance and Sexual Activity  . Alcohol use: No  . Drug use: Yes    Frequency: 5.0 times per week    Types: Cocaine, Marijuana, Methamphetamines    Comment: Heroin  . Sexual activity: Yes    Partners: Male    Birth control/protection: None  Other Topics Concern  . Not on file  Social History Narrative  . Not on file   Social Determinants of Health   Financial Resource Strain:   . Difficulty of Paying Living Expenses:   Food Insecurity:   . Worried About Programme researcher, broadcasting/film/video in the Last Year:   . Barista in the Last Year:   Transportation Needs:   . Freight forwarder (Medical):   Marland Kitchen Lack of Transportation (Non-Medical):   Physical Activity:   . Days of Exercise per Week:   . Minutes of Exercise per Session:   Stress:   . Feeling of Stress :   Social Connections:   . Frequency of Communication with Friends and Family:   . Frequency of Social Gatherings with Friends and Family:   . Attends Religious Services:   . Active Member of Clubs or Organizations:   . Attends Banker Meetings:   Marland Kitchen Marital Status:    Additional Social History:    Allergies:   Allergies  Allergen Reactions  . Dextromethorphan Swelling    Labs:  Results for orders placed  or performed during the hospital encounter of 05/11/20 (from the past 48 hour(s))  Lactic acid, plasma     Status: None   Collection Time: 05/11/20  2:39 PM  Result Value Ref Range   Lactic Acid, Venous 1.0 0.5 - 1.9 mmol/L    Comment: Performed at [facility], [address] [place], [place] [address]  CBG monitoring, ED     Status: None   Collection Time: 05/11/20  4:01 PM  Result Value Ref Range   Glucose-Capillary 91 70 - 99 mg/dL    Comment: Glucose reference range applies only to samples taken after fasting for at least 8 hours.  CBG monitoring, ED     Status: Abnormal   Collection Time: 05/11/20  9:14 PM  Result Value Ref Range   Glucose-Capillary 66 (L) 70 - 99 mg/dL    Comment: Glucose reference range applies only to samples taken after fasting for at least 8 hours.  CBC     Status: Abnormal   Collection Time: 05/11/20 10:38 PM  Result Value Ref Range   WBC 13.6 (H) 4.0 - 10.5 K/uL   RBC 4.13 3.87 - 5.11 MIL/uL   Hemoglobin 12.4 12.0 - 15.0 g/dL   HCT 40.9 81.1 - 91.4 %   MCV 93.2 80.0 - 100.0 fL   MCH 30.0 26.0 - 34.0 pg   MCHC 32.2 30.0 - 36.0 g/dL   RDW 78.2 95.6 - 21.3 %   Platelets 226 150 - 400 K/uL   nRBC 0.0 0.0 - 0.2 %    Comment: Performed at [facility], [address], [place], [place] [address]  Protime-INR     Status: None   Collection Time: 05/11/20 10:38 PM  Result Value Ref Range   Prothrombin Time 14.2 11.4 - 15.2 seconds   INR 1.1 0.8 - 1.2    Comment: (NOTE) INR goal varies based on device and disease states. Performed at [facility], [address], [place], [place] [address]   CBG monitoring, ED     Status: None   Collection Time: 05/12/20  1:21 AM  Result Value Ref Range   Glucose-Capillary 82 70 - 99 mg/dL    Comment: Glucose reference range applies only to samples taken after fasting for at least 8 hours.  Comprehensive metabolic panel     Status: Abnormal   Collection Time:  05/12/20  2:48 AM  Result Value Ref Range   Sodium 137 135 - 145 mmol/L    Comment: DELTA CHECK NOTED   Potassium 3.4 (L) 3.5 - 5.1 mmol/L   Chloride 105 98 - 111 mmol/L   CO2 23 22 - 32 mmol/L   Glucose, Bld 86 70 - 99 mg/dL    Comment: Glucose reference range applies only to samples taken after fasting for at least 8 hours.   BUN 11 6 - 20 mg/dL   Creatinine, Ser 2.95 0.44 - 1.00 mg/dL   Calcium 7.9 (L) 8.9 - 10.3 mg/dL   Total Protein 5.2 (L) 6.5 - 8.1 g/dL   Albumin 2.2 (L) 3.5 - 5.0 g/dL   AST 28 15 - 41 U/L   ALT 26 0 - 44 U/L   Alkaline Phosphatase 90 38 - 126 U/L   Total Bilirubin 1.8 (H) 0.3 - 1.2 mg/dL   GFR calc non Af Amer >60 >60 mL/min   GFR calc Af Amer >60 >60 mL/min   Anion gap 9 5 - 15    Comment: Performed at [facility], [address] [place], [place] [address]  Magnesium     Status: Abnormal   Collection Time: 05/12/20  2:48 AM  Result Value Ref Range   Magnesium 1.6 (L) 1.7 - 2.4 mg/dL    Comment: Performed at [facility], [address], [place], [place] [address]  Phosphorus     Status: Abnormal   Collection Time: 05/12/20  2:48 AM  Result Value Ref Range   Phosphorus 2.4 (L) 2.5 - 4.6 mg/dL    Comment: Performed at [facility], [address] [place], [place] [address]  CBC     Status: Abnormal   Collection Time: 05/12/20  2:48 AM  Result Value Ref Range   WBC 12.5 (H) 4.0 - 10.5 K/uL   RBC 4.25 3.87 - 5.11 MIL/uL   Hemoglobin 12.8 12.0 - 15.0 g/dL   HCT 36.6 44.0 - 34.7 %   MCV 92.5 80.0 -  100.0 fL   MCH 30.1 26.0 - 34.0 pg   MCHC 32.6 30.0 - 36.0 g/dL   RDW 13.5 11.5 - 15.5 %   Platelets 229 150 - 400 K/uL   nRBC 0.0 0.0 - 0.2 %    Comment: Performed at [facility], [place] [address], [place], [place] [address]  MRSA PCR Screening     Status: None   Collection Time: 05/12/20  3:07 AM   Specimen: Nasopharyngeal  Result Value Ref Range   MRSA by PCR NEGATIVE  NEGATIVE    Comment:        The GeneXpert MRSA Assay (FDA approved for NASAL specimens only), is one component of a comprehensive MRSA colonization surveillance program. It is not intended to diagnose MRSA infection nor to guide or monitor treatment for MRSA infections. Performed at [facility], [place] [address], [place], [place] [address]   Glucose, capillary     Status: None   Collection Time: 05/12/20  3:35 AM  Result Value Ref Range   Glucose-Capillary 91 70 - 99 mg/dL    Comment: Glucose reference range applies only to samples taken after fasting for at least 8 hours.   Comment 1 Notify RN    Comment 2 Document in Chart   Glucose, capillary     Status: None   Collection Time: 05/12/20  7:58 AM  Result Value Ref Range   Glucose-Capillary 95 70 - 99 mg/dL    Comment: Glucose reference range applies only to samples taken after fasting for at least 8 hours.   Comment 1 Notify RN    Comment 2 Document in Chart   Glucose, capillary     Status: None   Collection Time: 05/12/20 11:29 AM  Result Value Ref Range   Glucose-Capillary 72 70 - 99 mg/dL    Comment: Glucose reference range applies only to samples taken after fasting for at least 8 hours.  Culture, blood (routine x 2)     Status: None (Preliminary result)   Collection Time: 05/12/20 11:54 AM   Specimen: BLOOD LEFT HAND  Result Value Ref Range   Specimen Description      BLOOD LEFT HAND Performed at [facility] [address], [place], [place] [address]    Special Requests      BOTTLES DRAWN AEROBIC AND ANAEROBIC Blood Culture adequate volume Performed at [facility] [address], [place], [place] [address]    Culture      NO GROWTH < 24 HOURS Performed at [facility] [address] [place], [place] [address]    Report Status PENDING   Culture, blood (routine x 2)     Status: None (Preliminary result)   Collection Time: 05/12/20 11:54 AM    Specimen: BLOOD RIGHT HAND  Result Value Ref Range   Specimen Description      BLOOD RIGHT HAND Performed at [facility] [address] [place], [place] [address]    Special Requests      BOTTLES DRAWN AEROBIC ONLY Blood Culture adequate volume Performed at [facility] [address] [place], [place] [address]    Culture      NO GROWTH < 24 HOURS Performed at [facility] [address] [place], [place] [address]    Report Status PENDING   Glucose, capillary     Status: Abnormal   Collection Time: 05/12/20  4:36 PM  Result Value Ref Range  Glucose-Capillary 130 (H) 70 - 99 mg/dL    Comment: Glucose reference range applies only to samples taken after fasting for at least 8 hours.  Glucose, capillary     Status: None   Collection Time: 05/12/20  7:38 PM  Result Value Ref Range   Glucose-Capillary 74 70 - 99 mg/dL    Comment: Glucose reference range applies only to samples taken after fasting for at least 8 hours.  Glucose, capillary     Status: None   Collection Time: 05/13/20  7:54 AM  Result Value Ref Range   Glucose-Capillary 88 70 - 99 mg/dL    Comment: Glucose reference range applies only to samples taken after fasting for at least 8 hours.   Comment 1 Notify RN    Comment 2 Document in Chart   HIV Antibody (routine testing w rflx)     Status: None   Collection Time: 05/13/20  9:09 AM  Result Value Ref Range   HIV Screen 4th Generation wRfx Non Reactive Non Reactive    Comment: Performed at [facility], [address] [place], [place] [address]  Comprehensive metabolic panel     Status: Abnormal   Collection Time: 05/13/20  9:09 AM  Result Value Ref Range   Sodium 137 135 - 145 mmol/L   Potassium 3.4 (L) 3.5 - 5.1 mmol/L   Chloride 106 98 - 111 mmol/L   CO2 23 22 - 32 mmol/L   Glucose, Bld 108 (H) 70 - 99 mg/dL    Comment: Glucose reference range applies only to samples taken after fasting for at least 8  hours.   BUN 7 6 - 20 mg/dL   Creatinine, Ser 4.96 0.44 - 1.00 mg/dL   Calcium 7.6 (L) 8.9 - 10.3 mg/dL   Total Protein 5.3 (L) 6.5 - 8.1 g/dL   Albumin 2.2 (L) 3.5 - 5.0 g/dL   AST 41 15 - 41 U/L   ALT 33 0 - 44 U/L   Alkaline Phosphatase 104 38 - 126 U/L   Total Bilirubin 0.5 0.3 - 1.2 mg/dL   GFR calc non Af Amer >60 >60 mL/min   GFR calc Af Amer >60 >60 mL/min   Anion gap 8 5 - 15    Comment: Performed at [facility], [address] [place], [place] [address]  Magnesium     Status: None   Collection Time: 05/13/20  9:09 AM  Result Value Ref Range   Magnesium 1.9 1.7 - 2.4 mg/dL    Comment: Performed at [facility], [address] [place], [place] [address]  Phosphorus     Status: None   Collection Time: 05/13/20  9:09 AM  Result Value Ref Range   Phosphorus 2.7 2.5 - 4.6 mg/dL    Comment: Performed at [facility], [address], [place], [place] [address]  CBC     Status: Abnormal   Collection Time: 05/13/20  9:09 AM  Result Value Ref Range   WBC 7.6 4.0 - 10.5 K/uL   RBC 3.90 3.87 - 5.11 MIL/uL   Hemoglobin 11.6 (L) 12.0 - 15.0 g/dL   HCT 70.1 (L) 77.9 - 39.0 %   MCV 91.0 80.0 - 100.0 fL   MCH 29.7 26.0 - 34.0 pg   MCHC 32.7 30.0 - 36.0 g/dL   RDW 30.0 92.3 - 30.0 %   Platelets 213 150 - 400 K/uL   nRBC 0.0 0.0 - 0.2 %  Comment: Performed at [facility], [address], [place], [place] [address]  Glucose, capillary     Status: Abnormal   Collection Time: 05/13/20 11:33 AM  Result Value Ref Range   Glucose-Capillary 115 (H) 70 - 99 mg/dL    Comment: Glucose reference range applies only to samples taken after fasting for at least 8 hours.   Comment 1 Notify RN    Comment 2 Document in Chart     Medications:  Current Facility-Administered Medications  Medication Dose Route Frequency Provider Last Rate Last Admin  . 0.9 %  sodium chloride infusion  250 mL Intravenous Continuous  Karl Ito, MD 20 mL/hr at 05/12/20 0215 250 mL at 05/12/20 0215  . acetaminophen (TYLENOL) tablet 500 mg  500 mg Oral Q6H PRN Karie Fetch P, DO   500 mg at 05/12/20 2225  . Chlorhexidine Gluconate Cloth 2 % PADS 6 each  6 each Topical Daily Steffanie Dunn, DO   6 each at 05/12/20 2225  . clindamycin (CLEOCIN) IVPB 600 mg  600 mg Intravenous Q8H Steffanie Dunn, DO   Stopped at 05/13/20 [phone]  . diphenhydrAMINE (BENADRYL) capsule 50 mg  50 mg Oral QHS PRN Darl Pikes, MD   50 mg at 05/12/20 2225  . docusate sodium (COLACE) capsule 100 mg  100 mg Oral BID PRN Karie Fetch P, DO      . enoxaparin (LOVENOX) injection 40 mg  40 mg Subcutaneous Q24H Karie Fetch P, DO   40 mg at 05/12/20 1743  . [START ON 05/14/2020] feeding supplement (BOOST / RESOURCE BREEZE) liquid 1 Container  1 Container Oral Q24H Mannam, Praveen, MD      . feeding supplement (PRO-STAT SUGAR FREE 64) liquid 30 mL  30 mL Oral Daily Mannam, Praveen, MD      . methadone (DOLOPHINE) 10 MG/ML solution 10 mg  10 mg Oral Daily Karie Fetch P, DO   10 mg at 05/13/20 1937  . multivitamins with iron tablet 1 tablet  1 tablet Oral Daily Mannam, Praveen, MD      . nicotine (NICODERM CQ - dosed in mg/24 hours) patch 21 mg  21 mg Transdermal Daily Ollis, Brandi L, NP   21 mg at 05/13/20 1043  . ondansetron (ZOFRAN) injection 4 mg  4 mg Intravenous Q6H PRN Karie Fetch P, DO      . penicillin G potassium 12 Million Units in dextrose 5 % 500 mL continuous infusion  12 Million Units Intravenous Q12H Bell, Michelle T, RPH      . polyethylene glycol (MIRALAX / GLYCOLAX) packet 17 g  17 g Oral Daily PRN Steffanie Dunn, DO        Musculoskeletal: Strength & Muscle Tone: within normal limits Gait & Station: normal Patient leans: N/A  Psychiatric Specialty Exam: Physical Exam  Nursing note and vitals reviewed. Constitutional: She is oriented to person, place, and time. She appears well-developed.  HENT:  Head: Normocephalic.   Cardiovascular: Normal rate.  Respiratory: Effort normal.  Musculoskeletal:        General: Normal range of motion.     Cervical back: Normal range of motion.  Neurological: She is alert and oriented to person, place, and time.  Psychiatric: She has a normal mood and affect. Her speech is normal and behavior is normal. Thought content normal. Cognition and memory are normal.    Review of Systems  Constitutional: Negative.   HENT: Negative.   Eyes: Negative.   Respiratory:  Negative.   Cardiovascular: Negative.   Gastrointestinal: Negative.   Genitourinary: Negative.   Musculoskeletal: Negative.   Skin: Negative.   Neurological: Negative.   Psychiatric/Behavioral: Negative.     Blood pressure 130/84, pulse 72, temperature 98 F (36.7 C), temperature source Oral, resp. rate 16, height 5' (1.524 m), weight 51.8 kg, last menstrual period 05/04/2020, SpO2 100 %.Body mass index is 22.3 kg/m.  General Appearance: Casual  Eye Contact:  Good  Speech:  Clear and Coherent and Normal Rate  Volume:  Normal  Mood:  Euthymic  Affect:  Appropriate and Congruent  Thought Process:  Coherent, Goal Directed and Descriptions of Associations: Intact  Orientation:  Full (Time, Place, and Person)  Thought Content:  WDL and Logical  Suicidal Thoughts:  No  Homicidal Thoughts:  No  Memory:  Immediate;   Good Recent;   Good Remote;   Good  Judgement:  Fair  Insight:  Lacking  Psychomotor Activity:  Normal  Concentration:  Concentration: Good and Attention Span: Good  Recall:  Good  Fund of Knowledge:  Good  Language:  Good  Akathisia:  No  Handed:  Right  AIMS (if indicated):     Assets:  Communication Skills Desire for Improvement Financial Resources/Insurance Housing Intimacy Leisure Time Physical Health Resilience Social Support Talents/Skills  ADL's:  Intact  Cognition:  WNL  Sleep:        Treatment Plan Summary: Patient discussed with Dr. Lucianne MussKumar.  Patient is a 29 year old  female, who presents with arm pain x5 days.  Patient reports substance use disorder.  Patient denies all crisis criteria at this time.  Patient currently refuses substance use treatment referral options.  Based on my assessment today, patient does not meet inpatient psychiatric treatment criteria.  Recommendations: Medication management recommend consider placing patient on neurontin 200mg  by mouth, twice daily to address mood and substance use disorders.   Disposition: No evidence of imminent risk to self or others at present.   Patient does not meet criteria for psychiatric inpatient admission. Supportive therapy provided about ongoing stressors.  Please reconsult psychiatry as needed.  This service was provided via telemedicine using a 2-way, interactive audio and video technology.  Names of all persons participating in this telemedicine service and their role in this encounter. Name: [name] Role: Patient  Name: [name] Role: FNP    Patrcia Dollyina L Tate, FNP 05/13/2020 2:18 PM

## 2020-05-13 NOTE — Progress Notes (Signed)
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 Admitted with septic shock in setting of arm cellulitis vs abscess with severe anemia.   Past Medical History  Tricuspid endocarditis - MRSA IVDU  Significant Hospital Events   5/15 Admit with septic shock   Consults:  ID  Procedures:    Significant Diagnostic Tests:   CT forearm 5/15 >> septic thrombophlebitis median cubital vein, no fluid collections or free air  Echocardiogram 5/16 >> LVEF 60 to 23%, normal diastolic function.  Normal RV.  Abnormal tricuspid valve with persistent vegetation on septal and lateral leaflets.  Trivial TR.  RUQ [place] 5/16 >> contracted gallbladder but without shadowing stone or sonographic murphy.  Gallbladder wall is slightly thickened some of which may be related to contraction. Possible echogenic edema at the periphery of the thickened gallbladder wall with trace amounts of fluid adjacent to the gallbladder.  Indeterminate for gallbladder inflammatory process.   Micro Data:  COVID 5/15 >> negative  BCID 5/15 >> strep pyogenes detected  UC 5/15 >> Negative  BCx1 5/15 >> group A strep (s. Pyogenes) >> BCx2 6/16 >>   Antimicrobials:  Vanc 5/15 >> 5/16 Ceftriaxone 5/15 Cefepime 5/15 >> 5/16 Penicillin G 5/16 >> Clindamycin 5/16 >>  Interim history/subjective:  Afebrile, tmax on 5/16 104  On RA  Glucose range 70's-80's I/O 2.4L UOP, 2.2L+ in 24h Off levophed since 5/16 am  RN reports pt refused labs, breakfast this am.  States she will allow her labs and methadone to be given at 0900.   Objective   Blood pressure (!) 115/57, pulse 77, temperature 97.6 F (36.4 C), temperature source Axillary, resp. rate (!) 24, height 5' (1.524 m),  weight 51.8 kg, last menstrual period 05/04/2020, SpO2 97 %.        Intake/Output Summary (Last 24 hours) at 05/13/2020 0813 Last data filed at 05/13/2020 0600 Gross per 24 hour  Intake 1120.83 ml  Output --  Net 1120.83 ml   Filed Weights   05/11/20 0928 05/11/20 1218 05/12/20 0157  Weight: 47.5 kg 47.6 kg 51.8 kg    Examination: General: adult female lying in bed with head covered  PSY: flat, disinterested   HEENT: MM pink/moist, acne Neuro: AAOx4, speech clear, MAE CV: s1s2 RRR, ? TV murmur LSB PULM: non-labored on RA, lungs bilaterally clear GI: soft, bsx4 active  Extremities: warm/dry, RUE edema, mild erythema  Skin: no rashes or lesions  Resolved Hospital Problem list   Thrombocytopenia - ? If lab error / diluted  Acute Metabolic Encephalopathy  Lactic Acidosis  Hyponatremia   Assessment & Plan:   Septic Shock secondary to Septic Thrombophlebitis, Group A Strep Bacteremia, Persistent Tricuspid Valve Endocarditis  IVDA, hx tricuspid valve endocarditis with persistent vegetations on valve  -continue PCN G, clindamycin  -appreciate ID evaluation  -follow repeat blood cultures  -pt declined TEE 5/16 -transfer to medical tele   Acute on Chronic Anemia  No history of bleeding. Overcorrected for 2 units--> question if initial sample was diluted -trend CBC -transfuse for Hgb <7%   Hypokalemia Hypomagnesemia -monitor, replace as indicated  Hyperbilirubinemia  Possibly cholestasis from antibiotics. No evidence of hemolysis. CT of arm had suggested possible pericholecystic fluid -RUQ [place] as above,  indeterminate for infection. ?related to resolving sepsis?  Hypoglycemia  -follow glucose trend with Q4 accuchecks  -encourage diet as tolerated  History of Polysubstance Abuse Benzos, methamphetamine, heroin -monitor for evidence of withdrawal  -delirium prevention measures  -withdrawal protocol in place  -subutex not on formula, continue methadone  -await HIV  testing   Best practice:  Diet: NPO Pain/Anxiety/Delirium protocol (if indicated): n/a VAP protocol (if indicated): n/a DVT prophylaxis: SCDs GI prophylaxis: n/a Glucose control: accuchecks Mobility: bedrest Code Status: full Family Communication: Patient updated 5/17 on plan of care  Disposition: Tx to ICU  Labs   CBC: Recent Labs  Lab 05/11/20 1053 05/11/20 2238 05/12/20 0248  WBC 7.2 13.6* 12.5*  NEUTROABS 6.3  --   --   HGB 5.1* 12.4 12.8  HCT 17.0* 38.5 39.3  MCV 96.6 93.2 92.5  PLT 133* 226 229    Basic Metabolic Panel: Recent Labs  Lab 05/11/20 1053 05/11/20 1322 05/12/20 0248  NA 131* 127* 137  K 3.9 3.8 3.4*  CL 104 99 105  CO2 9* 13* 23  GLUCOSE 28* 52* 86  BUN 6 8 11   CREATININE <0.30* 0.35* 0.74  CALCIUM 6.1* 6.2* 7.9*  MG  --   --  1.6*  PHOS  --   --  2.4*   GFR: Estimated Creatinine Clearance: 73.9 mL/min (by C-G formula based on SCr of 0.74 mg/dL). Recent Labs  Lab 05/11/20 1053 05/11/20 1318 05/11/20 1322 05/11/20 1439 05/11/20 2238 05/12/20 0248  WBC 7.2  --   --   --  13.6* 12.5*  LATICACIDVEN >11.0* 1.9 1.9 1.0  --   --     Liver Function Tests: Recent Labs  Lab 05/11/20 1053 05/12/20 0248  AST 10* 28  ALT 11 26  ALKPHOS 26* 90  BILITOT 0.4 1.8*  PROT <3.0* 5.2*  ALBUMIN <1.0* 2.2*   No results for input(s): LIPASE, AMYLASE in the last 168 hours. No results for input(s): AMMONIA in the last 168 hours.  ABG    Component Value Date/Time   HCO3 10.0 (L) 05/11/2020 1322   ACIDBASEDEF 15.7 (H) 05/11/2020 1322   O2SAT 48.5 05/11/2020 1322     Coagulation Profile: Recent Labs  Lab 05/11/20 1053 05/11/20 2238  INR 2.1* 1.1    Cardiac Enzymes: No results for input(s): CKTOTAL, CKMB, CKMBINDEX, TROPONINI in the last 168 hours.  HbA1C: No results found for: HGBA1C  CBG: Recent Labs  Lab 05/12/20 0758 05/12/20 1129 05/12/20 1636 05/12/20 1938 05/13/20 0754  GLUCAP 95 72 130* 74 88    Transfer to  medical floor, to [facility] as of 5/18 am.    6/18, MSN, NP-C [facility] 05/13/2020, 8:13 AM   Please see [email or web address] for pager details.

## 2020-05-14 DIAGNOSIS — F1910 Other psychoactive substance abuse, uncomplicated: Secondary | ICD-10-CM

## 2020-05-14 LAB — COMPREHENSIVE METABOLIC PANEL
ALT: 34 U/L (ref 0–44)
AST: 38 U/L (ref 15–41)
Albumin: 2.2 g/dL — ABNORMAL LOW (ref 3.5–5.0)
Alkaline Phosphatase: 87 U/L (ref 38–126)
Anion gap: 9 (ref 5–15)
BUN: 5 mg/dL — ABNORMAL LOW (ref 6–20)
CO2: 25 mmol/L (ref 22–32)
Calcium: 7.7 mg/dL — ABNORMAL LOW (ref 8.9–10.3)
Chloride: 106 mmol/L (ref 98–111)
Creatinine, Ser: 0.69 mg/dL (ref 0.44–1.00)
GFR calc Af Amer: >60 mL/min (ref >60)
GFR calc non Af Amer: >60 mL/min (ref >60)
Glucose, Bld: 95 mg/dL (ref 70–99)
Potassium: 3.4 mmol/L — ABNORMAL LOW (ref 3.5–5.1)
Sodium: 140 mmol/L (ref 135–145)
Total Bilirubin: 0.8 mg/dL (ref 0.3–1.2)
Total Protein: 5.5 g/dL — ABNORMAL LOW (ref 6.5–8.1)

## 2020-05-14 LAB — CULTURE, BLOOD (ROUTINE X 2): Special Requests: ADEQUATE

## 2020-05-14 LAB — GLUCOSE, CAPILLARY
Glucose-Capillary: 101 mg/dL — ABNORMAL HIGH (ref 70–99)
Glucose-Capillary: 123 mg/dL — ABNORMAL HIGH (ref 70–99)
Glucose-Capillary: 89 mg/dL (ref 70–99)

## 2020-05-14 LAB — CBC
HCT: 33.3 % — ABNORMAL LOW (ref 36.0–46.0)
Hemoglobin: 10.9 g/dL — ABNORMAL LOW (ref 12.0–15.0)
MCH: 29.9 pg (ref 26.0–34.0)
MCHC: 32.7 g/dL (ref 30.0–36.0)
MCV: 91.2 fL (ref 80.0–100.0)
Platelets: 211 10*3/uL (ref 150–400)
RBC: 3.65 MIL/uL — ABNORMAL LOW (ref 3.87–5.11)
RDW: 14.2 % (ref 11.5–15.5)
WBC: 6.4 10*3/uL (ref 4.0–10.5)
nRBC: 0 % (ref 0.0–0.2)

## 2020-05-14 LAB — PHOSPHORUS: Phosphorus: 4.4 mg/dL (ref 2.5–4.6)

## 2020-05-14 LAB — MAGNESIUM: Magnesium: 1.5 mg/dL — ABNORMAL LOW (ref 1.7–2.4)

## 2020-05-14 MED ORDER — ADULT MULTIVITAMIN W/MINERALS CH
1.0000 | ORAL_TABLET | Freq: Every day | ORAL | Status: DC
  Administered 2020-05-14: 1 via ORAL
  Filled 2020-05-14: qty 1

## 2020-05-14 NOTE — Progress Notes (Signed)
Patient leaving facility AMA. Educated patient on risks of leaving. Patient disregarded education. IV's discontinued. MD aware. AMA form signed with this RN as a witness.

## 2020-05-14 NOTE — Discharge Summary (Signed)
AMA Notice:  Patient leaving AMA. She is A&O x 3. Reviewed by psych. No suicidal, homicidal or psychotic. Explained to her that he treatment is not complete. I explained the associated risk/mortality with strep bacteremia, endocarditis, and drug abuse. She voiced understanding and stated she still wants to leave. She has signed AMA papers and left.   Teddy Spike, DO

## 2020-05-17 LAB — CULTURE, BLOOD (ROUTINE X 2)
Culture: NO GROWTH
Culture: NO GROWTH
Special Requests: ADEQUATE
Special Requests: ADEQUATE

## 2020-05-17 NOTE — Discharge Summary (Signed)
AMA Notice:  Patient leaving AMA. She is A&O x 3. Reviewed by psych. No suicidal, homicidal or psychotic. Explained to her that he treatment is not complete. I explained the associated risk/mortality with strep bacteremia, endocarditis, and drug abuse. She voiced understanding and stated she still wants to leave. She has signed AMA papers and left.   A/P Septic Shock secondary to Septic Thrombophlebitis, Group A Strep Bacteremia, Persistent Tricuspid Valve Endocarditis  IVDA, hx tricuspid valve endocarditis with persistent vegetations on valve  - patient left AMA declining examination or further care.  Acute on Chronic Anemia  - patient left AMA declining examination or further care.   Hypokalemia Hypomagnesemia - patient left AMA declining examination or further care.  Hyperbilirubinemia  - patient left AMA declining examination or further care.  Hypoglycemia  - patient left AMA declining examination or further care.  History of Polysubstance Abuse - patient left AMA declining examination or further care.    Phys Exam Pt decline examination  F/U Recommend follow up with PCP immediately.   Condition/Discharge: Left under own power while stable. She was A&O x 3 at the time. No services secured at time of AMA.   Teddy Spike, DO

## 2020-10-19 IMAGING — US US ABDOMEN LIMITED
1 series · 14 of 25 positions shown · non-contrast
Comparison: CT 05/11/2020

CLINICAL DATA: Hyperbilirubinemia

EXAM:
ULTRASOUND ABDOMEN LIMITED RIGHT UPPER QUADRANT

[Series 1: us abdomen limited · 14 of 50 slices shown]
[im 1/50]
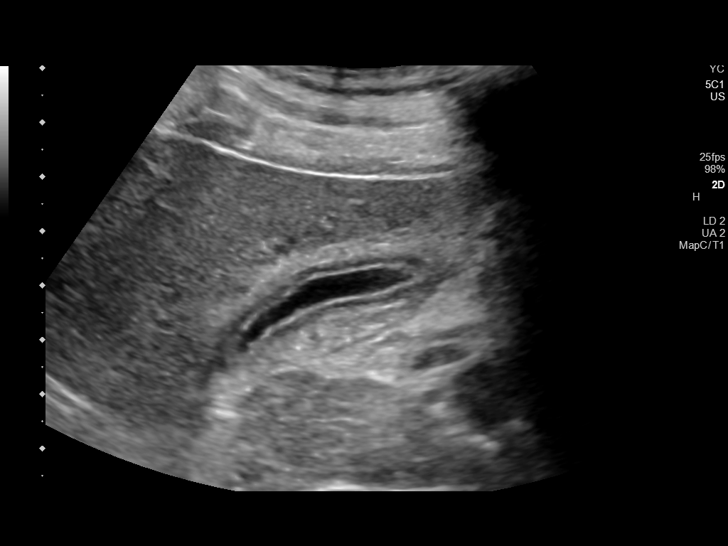
[im 5/50]
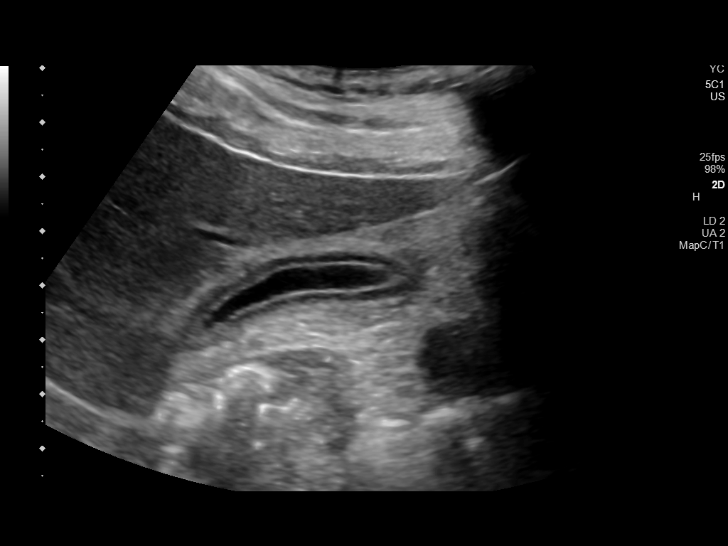
[im 9/50]
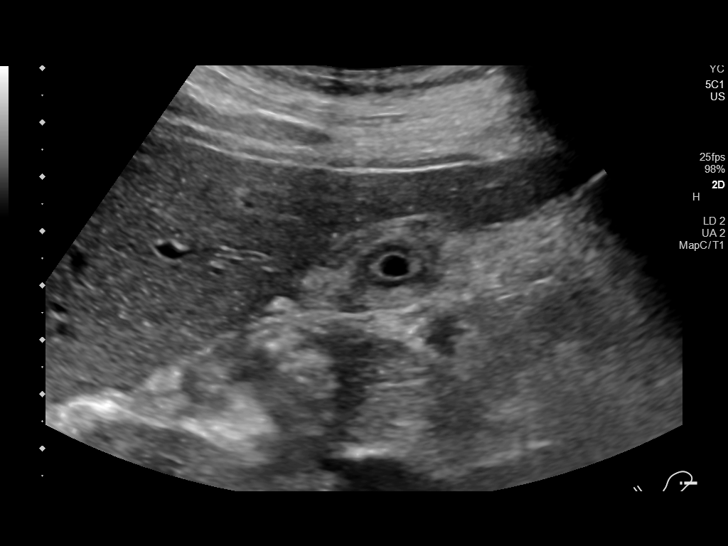
[im 13/50]
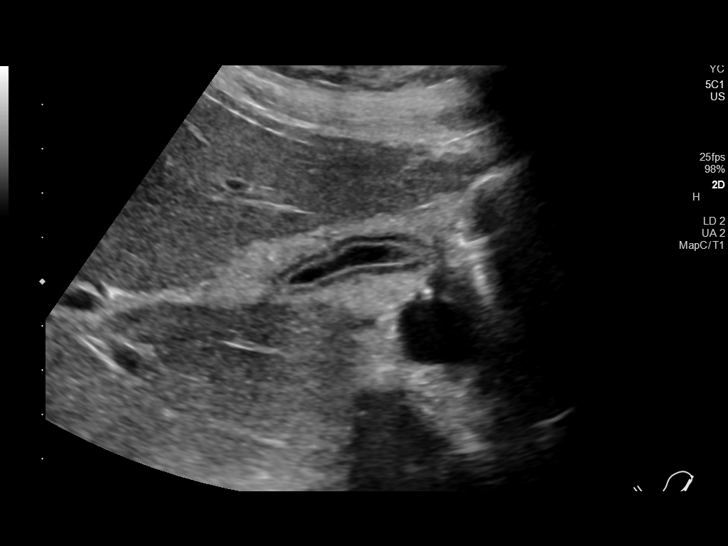
[im 17/50]
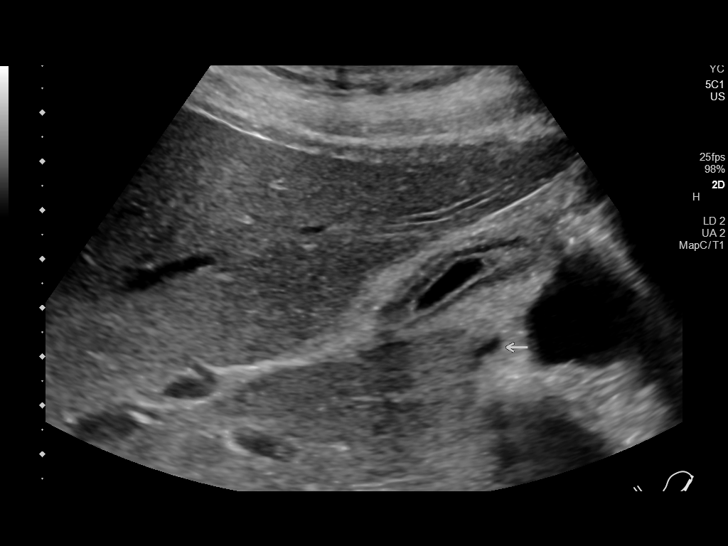
[im 19/50]
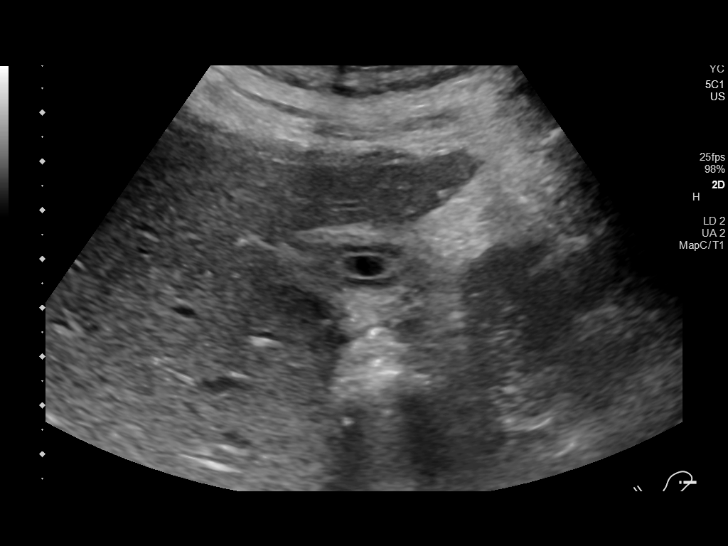
[im 23/50]
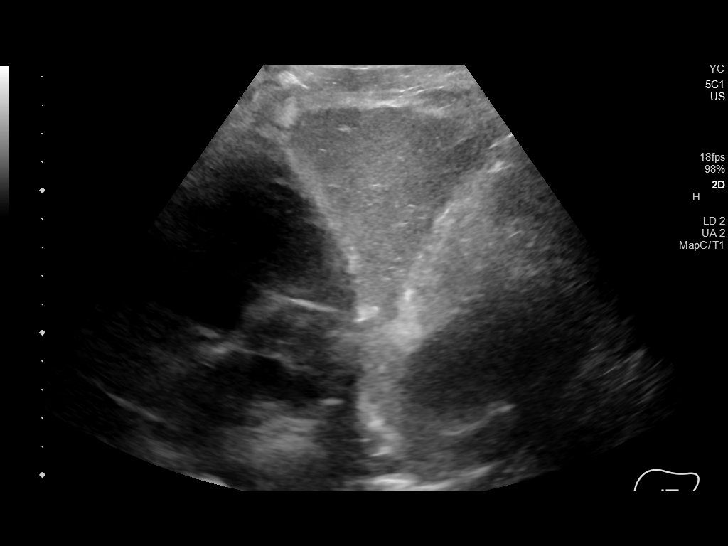
[im 27/50]
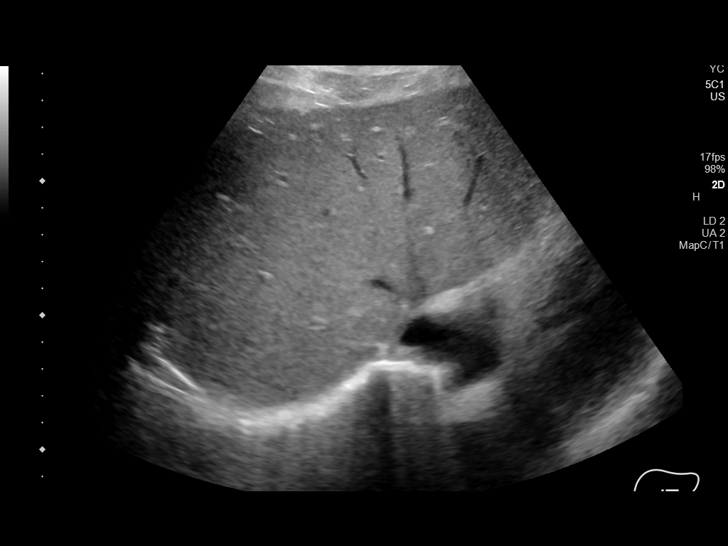
[im 31/50]
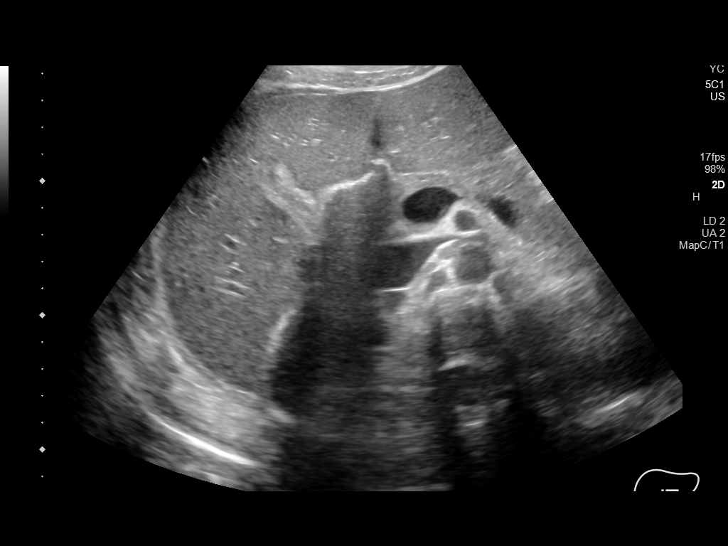
[im 33/50]
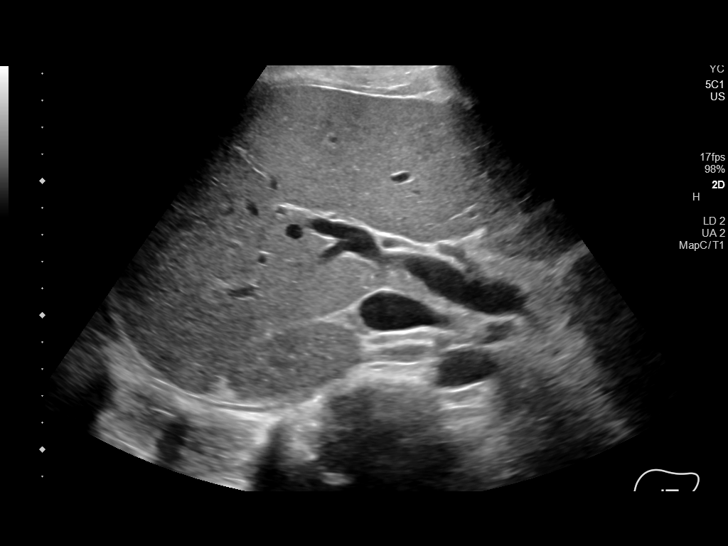
[im 37/50]
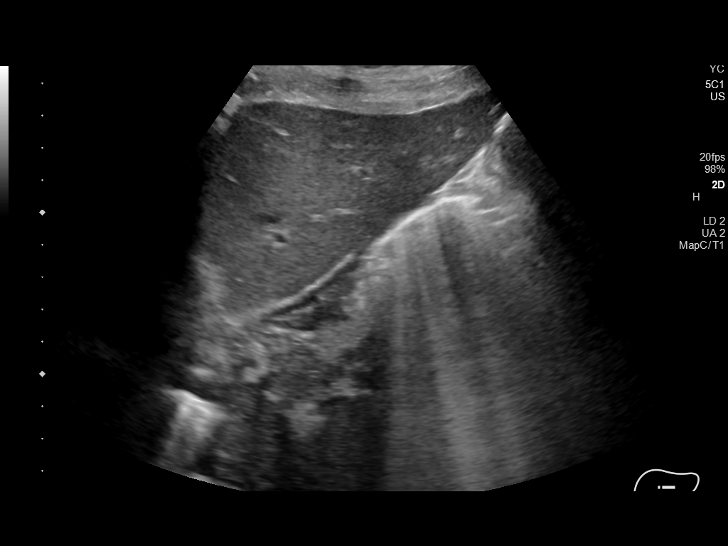
[im 41/50]
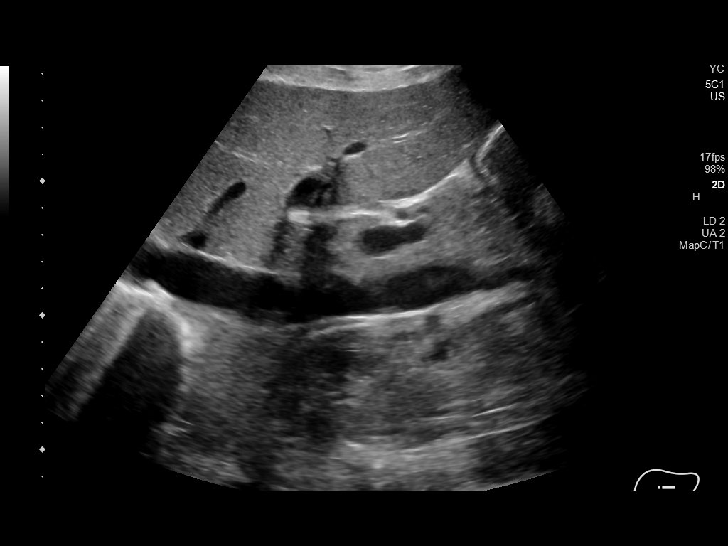
[im 45/50]
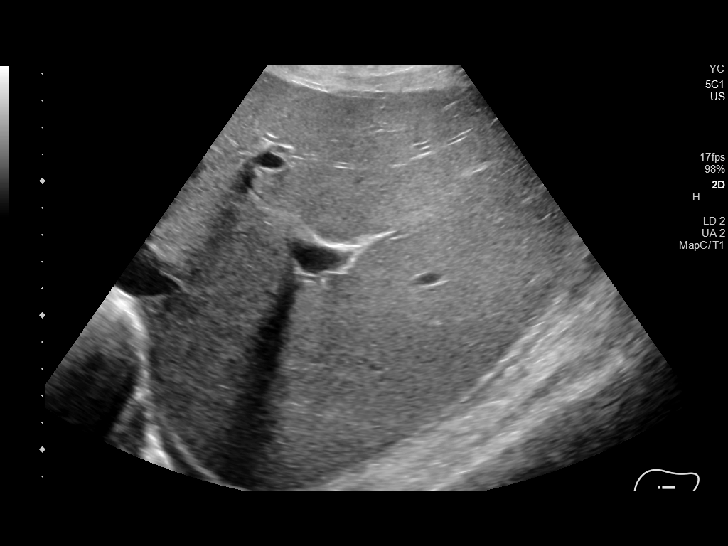
[im 50/50]
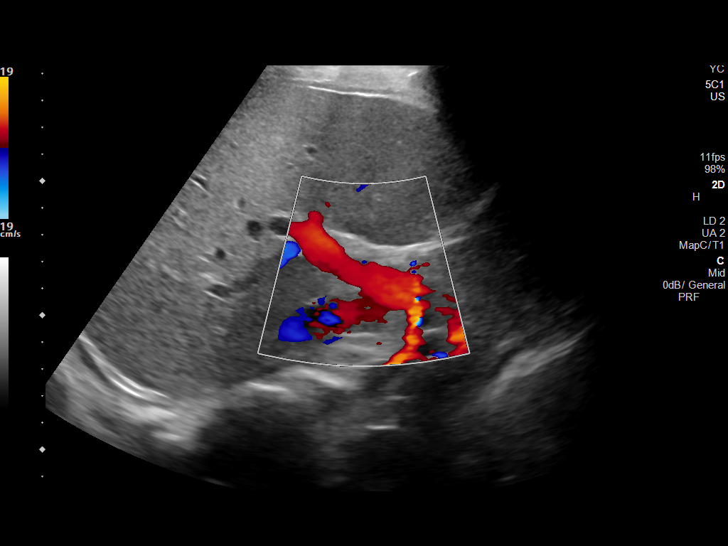

[14 of 25 positions shown; findings below may reference images not displayed]

FINDINGS: Gallbladder:

Contracted gallbladder without shadowing stone. Increased wall
thickness at 4.5 mm. Echogenic thickening at the periphery of the
gallbladder with hyperemia. Negative sonographic Murphy. Tiny fluid
adjacent to the gallbladder.

Common bile duct:

Diameter: 4.8 mm

Liver:

No focal lesion identified. Within normal limits in parenchymal
echogenicity. Portal vein is patent on color Doppler imaging with
normal direction of blood flow towards the liver.

Other: None.
IMPRESSION: 1. Contracted gallbladder but without shadowing stone or sonographic
Murphy. Gallbladder wall is slightly thickened some of which may be
related to contraction. Possible echogenic edema at the periphery of
the thickened gallbladder wall with trace amounts of fluid adjacent
to the gallbladder. Findings are indeterminate for gallbladder
inflammatory process, consider correlation with nuclear medicine
hepatobiliary imaging.

## 2020-12-19 ENCOUNTER — Emergency Department (HOSPITAL_BASED_OUTPATIENT_CLINIC_OR_DEPARTMENT_OTHER): Payer: Medicaid Other

## 2020-12-19 ENCOUNTER — Encounter (HOSPITAL_BASED_OUTPATIENT_CLINIC_OR_DEPARTMENT_OTHER): Payer: Self-pay

## 2020-12-19 ENCOUNTER — Inpatient Hospital Stay (HOSPITAL_BASED_OUTPATIENT_CLINIC_OR_DEPARTMENT_OTHER)
Admission: EM | Admit: 2020-12-19 | Discharge: 2021-01-04 | DRG: 872 | Disposition: A | Discharge status: Home / Self Care | Payer: Medicaid Other | Attending: Internal Medicine | Admitting: Internal Medicine

## 2020-12-19 ENCOUNTER — Other Ambulatory Visit: Payer: Self-pay

## 2020-12-19 DIAGNOSIS — Z821 Family history of blindness and visual loss: Secondary | ICD-10-CM

## 2020-12-19 DIAGNOSIS — Z8349 Family history of other endocrine, nutritional and metabolic diseases: Secondary | ICD-10-CM

## 2020-12-19 DIAGNOSIS — A419 Sepsis, unspecified organism: Secondary | ICD-10-CM

## 2020-12-19 DIAGNOSIS — F17210 Nicotine dependence, cigarettes, uncomplicated: Secondary | ICD-10-CM | POA: Diagnosis present

## 2020-12-19 DIAGNOSIS — E871 Hypo-osmolality and hyponatremia: Secondary | ICD-10-CM | POA: Diagnosis present

## 2020-12-19 DIAGNOSIS — D72829 Elevated white blood cell count, unspecified: Secondary | ICD-10-CM

## 2020-12-19 DIAGNOSIS — L739 Follicular disorder, unspecified: Secondary | ICD-10-CM | POA: Diagnosis present

## 2020-12-19 DIAGNOSIS — A4102 Sepsis due to Methicillin resistant Staphylococcus aureus: Principal | ICD-10-CM | POA: Diagnosis present

## 2020-12-19 DIAGNOSIS — N179 Acute kidney failure, unspecified: Secondary | ICD-10-CM | POA: Diagnosis present

## 2020-12-19 DIAGNOSIS — R000 Tachycardia, unspecified: Secondary | ICD-10-CM | POA: Diagnosis present

## 2020-12-19 DIAGNOSIS — F1410 Cocaine abuse, uncomplicated: Secondary | ICD-10-CM | POA: Diagnosis present

## 2020-12-19 DIAGNOSIS — F1920 Other psychoactive substance dependence, uncomplicated: Secondary | ICD-10-CM | POA: Diagnosis present

## 2020-12-19 DIAGNOSIS — L0293 Carbuncle, unspecified: Secondary | ICD-10-CM | POA: Diagnosis present

## 2020-12-19 DIAGNOSIS — Z23 Encounter for immunization: Secondary | ICD-10-CM

## 2020-12-19 DIAGNOSIS — Z818 Family history of other mental and behavioral disorders: Secondary | ICD-10-CM

## 2020-12-19 DIAGNOSIS — Z8249 Family history of ischemic heart disease and other diseases of the circulatory system: Secondary | ICD-10-CM

## 2020-12-19 DIAGNOSIS — Z20822 Contact with and (suspected) exposure to covid-19: Secondary | ICD-10-CM | POA: Diagnosis present

## 2020-12-19 DIAGNOSIS — Z8679 Personal history of other diseases of the circulatory system: Secondary | ICD-10-CM

## 2020-12-19 DIAGNOSIS — Z79899 Other long term (current) drug therapy: Secondary | ICD-10-CM

## 2020-12-19 DIAGNOSIS — B1710 Acute hepatitis C without hepatic coma: Secondary | ICD-10-CM

## 2020-12-19 DIAGNOSIS — R8271 Bacteriuria: Secondary | ICD-10-CM | POA: Diagnosis present

## 2020-12-19 DIAGNOSIS — I38 Endocarditis, valve unspecified: Secondary | ICD-10-CM | POA: Diagnosis present

## 2020-12-19 DIAGNOSIS — B182 Chronic viral hepatitis C: Secondary | ICD-10-CM | POA: Diagnosis present

## 2020-12-19 DIAGNOSIS — R21 Rash and other nonspecific skin eruption: Secondary | ICD-10-CM

## 2020-12-19 DIAGNOSIS — L03213 Periorbital cellulitis: Secondary | ICD-10-CM | POA: Diagnosis present

## 2020-12-19 DIAGNOSIS — R7881 Bacteremia: Secondary | ICD-10-CM

## 2020-12-19 DIAGNOSIS — F1110 Opioid abuse, uncomplicated: Secondary | ICD-10-CM | POA: Diagnosis present

## 2020-12-19 DIAGNOSIS — Z833 Family history of diabetes mellitus: Secondary | ICD-10-CM

## 2020-12-19 DIAGNOSIS — F1210 Cannabis abuse, uncomplicated: Secondary | ICD-10-CM | POA: Diagnosis present

## 2020-12-19 LAB — CBC WITH DIFFERENTIAL/PLATELET
Abs Immature Granulocytes: 0.06 10*3/uL (ref 0.00–0.07)
Basophils Absolute: 0 10*3/uL (ref 0.0–0.1)
Basophils Relative: 0 %
Eosinophils Absolute: 0.2 10*3/uL (ref 0.0–0.5)
Eosinophils Relative: 2 %
HCT: 38 % (ref 36.0–46.0)
Hemoglobin: 12.7 g/dL (ref 12.0–15.0)
Immature Granulocytes: 1 %
Lymphocytes Relative: 22 %
Lymphs Abs: 2.7 10*3/uL (ref 0.7–4.0)
MCH: 31.5 pg (ref 26.0–34.0)
MCHC: 33.4 g/dL (ref 30.0–36.0)
MCV: 94.3 fL (ref 80.0–100.0)
Monocytes Absolute: 1.1 10*3/uL — ABNORMAL HIGH (ref 0.1–1.0)
Monocytes Relative: 9 %
Neutro Abs: 8.4 10*3/uL — ABNORMAL HIGH (ref 1.7–7.7)
Neutrophils Relative %: 66 %
Platelets: 344 10*3/uL (ref 150–400)
RBC: 4.03 MIL/uL (ref 3.87–5.11)
RDW: 13.2 % (ref 11.5–15.5)
WBC: 12.5 10*3/uL — ABNORMAL HIGH (ref 4.0–10.5)
nRBC: 0 % (ref 0.0–0.2)

## 2020-12-19 LAB — COMPREHENSIVE METABOLIC PANEL
ALT: 59 U/L — ABNORMAL HIGH (ref 0–44)
AST: 43 U/L — ABNORMAL HIGH (ref 15–41)
Albumin: 3.9 g/dL (ref 3.5–5.0)
Alkaline Phosphatase: 67 U/L (ref 38–126)
Anion gap: 11 (ref 5–15)
BUN: 12 mg/dL (ref 6–20)
CO2: 26 mmol/L (ref 22–32)
Calcium: 8.9 mg/dL (ref 8.9–10.3)
Chloride: 96 mmol/L — ABNORMAL LOW (ref 98–111)
Creatinine, Ser: 0.9 mg/dL (ref 0.44–1.00)
GFR, Estimated: >60 mL/min (ref >60)
Glucose, Bld: 115 mg/dL — ABNORMAL HIGH (ref 70–99)
Potassium: 3.9 mmol/L (ref 3.5–5.1)
Sodium: 133 mmol/L — ABNORMAL LOW (ref 135–145)
Total Bilirubin: 0.7 mg/dL (ref 0.3–1.2)
Total Protein: 7.5 g/dL (ref 6.5–8.1)

## 2020-12-19 LAB — LACTIC ACID, PLASMA: Lactic Acid, Venous: 1.7 mmol/L (ref 0.5–1.9)

## 2020-12-19 MED ORDER — CEFAZOLIN SODIUM-DEXTROSE 1-4 GM/50ML-% IV SOLN
1.0000 g | Freq: Three times a day (TID) | INTRAVENOUS | Status: DC
  Administered 2020-12-20 (×3): 1 g via INTRAVENOUS
  Filled 2020-12-19 (×4): qty 50

## 2020-12-19 NOTE — ED Provider Notes (Signed)
[facility] EMERGENCY DEPARTMENT Provider Note   CSN: [id number] Arrival date & time: 12/19/20  1616     History Chief Complaint  Patient presents with  . Recurrent Skin Infections    [name] is a 29 y.o. female.  Presented to ER with concern for recurrent infections.  Patient reports that she has been clean for 120 days from IV drug use.  Over the past couple weeks she has been noticing recurrent skin infections, has wounds over her face, neck, legs.  States that she has noticed little pimples that pop up and become ulcerated, some turned into abscesses.  Some go away spontaneously.  She denies fevers, chills.  No chest pain or difficulty in breathing.  Per chart review, patient has history of endocarditis, bacteremia.  Last admission in May for septic shock, group A strep pyogenes bacteremia, persistent tricuspid valve endocarditis.  Patient states she never followed up with infectious disease or any other doctors.  HPI     Past Medical History:  Diagnosis Date  . History of RPR test 07/21/2016   02/2016: 1:1 with negative TPA testing [ ]  f/u 28wks [ ]  f/u admit RPR [ ]  f/u PP RPR  . History of self-harm   . Opioid abuse (HCC)   . Polysubstance abuse [facility])     Patient Active Problem List   Diagnosis Date Noted  . Malnutrition of moderate degree 05/13/2020  . Septic shock (HCC) 05/11/2020  . 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 Iron deficiency anemia due to chronic blood loss 10/29/2019  . MRSA bacteremia 10/27/2019  . Injection of illicit drug within last 12 months 10/27/2019  . Sepsis (HCC) 10/26/2019  . Septic embolism (HCC) 10/26/2019  . Cavitary pneumonia 10/26/2019  . 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 Substance induced mood disorder (HCC) 04/19/2018  . Polysubstance (including opioids) dependence, daily use (HCC) 02/03/2018  . Polysubstance dependence including opioid type drug without complication, episodic abuse (HCC) 12/16/2017  .  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 Normal labor 10/16/2016  . Cigarette smoker 08/18/2016  . Pregnancy complicated by subutex maintenance, antepartum (HCC) 08/04/2016  . Hepatitis C, chronic, maternal, antepartum (HCC) 08/04/2016  . Supervision of high-risk pregnancy 07/21/2016  . ASCUS with positive high risk HPV 07/21/2016    Past Surgical History:  Procedure Laterality Date  . BUBBLE STUDY  10/30/2019   Procedure: BUBBLE STUDY;  Surgeon: 07/23/2016, MD;  Location: [facility] ENDOSCOPY;  Service: Cardiovascular;;  . TEE WITHOUT CARDIOVERSION N/A 10/30/2019   Procedure: TRANSESOPHAGEAL ECHOCARDIOGRAM (TEE);  Surgeon: Pricilla Riffle, MD;  Location: [facility] ENDOSCOPY;  Service: Cardiovascular;  Laterality: N/A;  . TONSILLECTOMY       OB History    Gravida  2   Para  2   Term  2   Preterm      AB      Living  2     SAB      IAB      Ectopic      Multiple  0   Live Births  2           Family History  Problem Relation Age of Onset  . Vision loss Mother   . Depression Mother   . Ulcers Mother   . Gallbladder disease Mother   . Mental illness Mother   . Thyroid disease Mother   . Allergies Father   . Vision loss Father   . Vision  loss Maternal Grandmother   . Ulcers Maternal Grandmother   . Gallbladder disease Maternal Grandmother   . Diabetes Maternal Grandmother   . Vision loss Maternal Grandfather   . Heart disease Maternal Grandfather   . Cancer Maternal Grandfather   . Allergies Paternal Grandmother   . Vision loss Paternal Grandmother   . Gallbladder disease Paternal Grandmother   . Arthritis Paternal Grandmother   . Cancer Paternal Grandmother   . Allergies Paternal Grandfather   . Vision loss Paternal Grandfather     Social History   Tobacco Use  . Smoking status: Current Every Day Smoker    Packs/day: 0.50    Types: Cigarettes  . Smokeless tobacco: Never Used  Substance Use Topics  . Alcohol  use: No  . Drug use: Yes    Frequency: 5.0 times per week    Types: Cocaine, Marijuana, Methamphetamines    Comment: pt states that she has "been clean since Aug 19th 2021"    Home Medications Prior to Admission medications   Medication Sig Start Date End Date Taking? Authorizing Provider  ibuprofen (ADVIL) 200 MG tablet Take 600 mg by mouth every 6 (six) hours as needed for moderate pain.    [provider]  gabapentin (NEURONTIN) 400 MG capsule Take 400 mg by mouth 3 (three) times daily.  10/26/19  [provider]  mirtazapine (REMERON) 30 MG tablet Take 30 mg by mouth at bedtime.  10/26/19  [provider]  risperiDONE (RISPERDAL) 1 MG tablet Take 1 tablet (1 mg total) by mouth at bedtime. Patient not taking: Reported on 09/16/2018 07/13/18 10/26/19  Charm Rings, NP  traZODone (DESYREL) 50 MG tablet Take 1 tablet (50 mg total) by mouth at bedtime as needed for sleep. Patient not taking: Reported on 09/16/2018 07/13/18 10/26/19  Charm Rings, NP    Allergies    Dextromethorphan  Review of Systems   Review of Systems  Constitutional: Negative for chills and fever.  HENT: Negative for ear pain and sore throat.        Facial lesion  Eyes: Positive for redness. Negative for pain and visual disturbance.  Respiratory: Negative for cough and shortness of breath.   Cardiovascular: Negative for chest pain and palpitations.  Gastrointestinal: Negative for abdominal pain and vomiting.  Genitourinary: Negative for dysuria and hematuria.  Musculoskeletal: Negative for arthralgias and back pain.  Skin: Positive for color change, rash and wound.  Neurological: Negative for seizures and syncope.  All other systems reviewed and are negative.   Physical Exam Updated Vital Signs BP 131/84   Pulse 99   Temp 98.4 F (36.9 C) (Oral)   Resp 16   Ht 5' (1.524 m)   Wt 56.2 kg   LMP 11/27/2020   SpO2 98%   BMI 24.22 kg/m   Physical Exam Vitals and nursing  note reviewed.  Constitutional:      General: She is not in acute distress.    Appearance: She is well-developed and well-nourished.  HENT:     Head: Normocephalic.     Comments: Scattered ulcerated lesions covering most of her face, mostly blanchable erythema, occasional lesions over neck, on left neck there is approximately 1 cm area of swelling and induration with overlying blanchable erythema    Mouth/Throat:     Mouth: Mucous membranes are dry.     Pharynx: Oropharynx is clear.  Eyes:     Conjunctiva/sclera: Conjunctivae normal.  Cardiovascular:     Rate and Rhythm: Regular rhythm.  Tachycardia present.     Heart sounds: No murmur heard.   Pulmonary:     Effort: Pulmonary effort is normal. No respiratory distress.     Breath sounds: Normal breath sounds.  Abdominal:     Palpations: Abdomen is soft.     Tenderness: There is no abdominal tenderness.  Musculoskeletal:        General: No edema.     Cervical back: Neck supple.  Skin:    General: Skin is warm and dry.     Comments: Occasional scattered flat blanchable erythematous lesion over anterior upper legs, occasional scattered slightly raised papule/vesicular appearing lesion over anterior upper leg  Neurological:     General: No focal deficit present.     Mental Status: She is alert.  Psychiatric:        Mood and Affect: Mood and affect normal.     ED Results / Procedures / Treatments   Labs (all labs ordered are listed, but only abnormal results are displayed) Labs Reviewed  COMPREHENSIVE METABOLIC PANEL - Abnormal; Notable for the following components:      Result Value   Sodium 133 (*)    Chloride 96 (*)    Glucose, Bld 115 (*)    AST 43 (*)    ALT 59 (*)    All other components within normal limits  CBC WITH DIFFERENTIAL/PLATELET - Abnormal; Notable for the following components:   WBC 12.5 (*)    Neutro Abs 8.4 (*)    Monocytes Absolute 1.1 (*)    All other components within normal limits  CULTURE,  BLOOD (ROUTINE X 2)  CULTURE, BLOOD (ROUTINE X 2)  CULTURE, BLOOD (SINGLE)  RESP PANEL BY RT-PCR (FLU A&B, COVID) ARPGX2  LACTIC ACID, PLASMA  URINALYSIS, ROUTINE W REFLEX MICROSCOPIC  PREGNANCY, URINE  SEDIMENTATION RATE  C-REACTIVE PROTEIN  RAPID URINE DRUG SCREEN, HOSP PERFORMED    EKG EKG Interpretation  Date/Time:  Thursday December 19 2020 16:47:00 EST Ventricular Rate:  120 PR Interval:  130 QRS Duration: 68 QT Interval:  310 QTC Calculation: 438 R Axis:   101 Text Interpretation: Sinus tachycardia Rightward axis Borderline ECG Confirmed by Marianna Fussykstra, Sharnika Binney ([id number]) on 12/19/2020 10:56:42 PM   Radiology DG Chest 2 View  Result Date: 12/19/2020 CLINICAL DATA:  29 year old female with tachycardia. EXAM: CHEST - 2 VIEW COMPARISON:  Chest radiograph dated 05/12/2020. FINDINGS: The heart size and mediastinal contours are within normal limits. Both lungs are clear. The visualized skeletal structures are unremarkable. IMPRESSION: No active cardiopulmonary disease. Electronically Signed   By: Elgie CollardArash  Radparvar M.D.   On: 12/19/2020 22:12    Procedures Procedures (including critical care time)  Medications Ordered in ED Medications  ceFAZolin (ANCEF) IVPB 1 g/50 mL premix (has no administration in time range)    ED Course  I have reviewed the triage vital signs and the nursing notes.  Pertinent labs & imaging results that were available during my care of the patient were reviewed by me and considered in my medical decision making (see chart for details).  Clinical Course as of 12/19/20 2333  Thu Dec 19, 2020  2255 Reviewed case with Dr. Jerolyn Centerynthia Snyder, she thinks her skin lesions may be a sign of untreated endocarditis, bacteremia and recommends admitting, IV antibiotics, blood cultures -specifically requests 2 set blood cultures now, 6 hours from now and then start Ancef once the third site has been collected [RD]    Clinical Course User Index [RD] Milagros Lollykstra, Merline Perkin S,  MD  MDM Rules/Calculators/A&P                            29 year old lady with history of IV drug use, tricuspid endocarditis, prior bacteremia presenting to ER with concern for recurrent skin infections.  On exam patient noted to be tachycardic, temp 99, extensive skin lesions over her face as well as occasional skin lesions on legs.  Over her face and neck, these appear somewhat ulcerated, couple small areas of small abscess suspected with overlying cellulitis.  On leg there were a couple small raised papules with small surrounding erythema.    I reviewed skin findings and medical history in detail with Dr. Jerolyn Center on-call for infectious disease.  Given her history concern these may be a sign of untreated endocarditis, bacteremia.  Recommended admitting for IV antibiotics, blood cultures -specifically requests 2 set blood cultures now, 6 hours from now and then start Ancef once the third site has been collected.  Discussed with patient, she appears very motivated and interested in pursuing treatment. Agreeable to admission and states she has no intentions of leaving AMA at this time. Will consult hospitalist for admission.  Final Clinical Impression(s) / ED Diagnoses Final diagnoses:  Endocarditis, unspecified chronicity, unspecified endocarditis type  Leukocytosis, unspecified type    Rx / DC Orders ED Discharge Orders    None       Milagros Loll, MD 12/19/20 2341

## 2020-12-19 NOTE — ED Notes (Signed)
Pt ambulatory to bathroom to provide urine sample

## 2020-12-19 NOTE — ED Notes (Signed)
Pt is aware she needs a urine sample. Pt has been given a cup of water ok per RN Seward Grater

## 2020-12-19 NOTE — ED Notes (Signed)
Infectious Dz Dr Drue Second paged

## 2020-12-19 NOTE — ED Triage Notes (Signed)
Pt arrives with c/o recurrent skin infections, pt has wounds on face, neck, reports she also has them around her groin. Reports about a year ago she became septic r/t skin infections.

## 2020-12-19 NOTE — ED Notes (Signed)
[facility] hospitalist paged for Dr Dykstra. 

## 2020-12-20 DIAGNOSIS — F1210 Cannabis abuse, uncomplicated: Secondary | ICD-10-CM | POA: Diagnosis present

## 2020-12-20 DIAGNOSIS — R21 Rash and other nonspecific skin eruption: Secondary | ICD-10-CM | POA: Diagnosis not present

## 2020-12-20 DIAGNOSIS — Z821 Family history of blindness and visual loss: Secondary | ICD-10-CM | POA: Diagnosis not present

## 2020-12-20 DIAGNOSIS — Z8679 Personal history of other diseases of the circulatory system: Secondary | ICD-10-CM | POA: Diagnosis not present

## 2020-12-20 DIAGNOSIS — Z20822 Contact with and (suspected) exposure to covid-19: Secondary | ICD-10-CM | POA: Diagnosis present

## 2020-12-20 DIAGNOSIS — I38 Endocarditis, valve unspecified: Secondary | ICD-10-CM | POA: Diagnosis present

## 2020-12-20 DIAGNOSIS — R8271 Bacteriuria: Secondary | ICD-10-CM | POA: Diagnosis present

## 2020-12-20 DIAGNOSIS — R7881 Bacteremia: Secondary | ICD-10-CM | POA: Diagnosis not present

## 2020-12-20 DIAGNOSIS — L0293 Carbuncle, unspecified: Secondary | ICD-10-CM | POA: Diagnosis present

## 2020-12-20 DIAGNOSIS — L03213 Periorbital cellulitis: Secondary | ICD-10-CM | POA: Diagnosis present

## 2020-12-20 DIAGNOSIS — E871 Hypo-osmolality and hyponatremia: Secondary | ICD-10-CM | POA: Diagnosis present

## 2020-12-20 DIAGNOSIS — I361 Nonrheumatic tricuspid (valve) insufficiency: Secondary | ICD-10-CM | POA: Diagnosis not present

## 2020-12-20 DIAGNOSIS — A419 Sepsis, unspecified organism: Secondary | ICD-10-CM | POA: Diagnosis not present

## 2020-12-20 DIAGNOSIS — F17210 Nicotine dependence, cigarettes, uncomplicated: Secondary | ICD-10-CM

## 2020-12-20 DIAGNOSIS — N179 Acute kidney failure, unspecified: Secondary | ICD-10-CM | POA: Diagnosis present

## 2020-12-20 DIAGNOSIS — F1920 Other psychoactive substance dependence, uncomplicated: Secondary | ICD-10-CM | POA: Diagnosis not present

## 2020-12-20 DIAGNOSIS — R000 Tachycardia, unspecified: Secondary | ICD-10-CM | POA: Diagnosis present

## 2020-12-20 DIAGNOSIS — Z79899 Other long term (current) drug therapy: Secondary | ICD-10-CM | POA: Diagnosis not present

## 2020-12-20 DIAGNOSIS — Z833 Family history of diabetes mellitus: Secondary | ICD-10-CM | POA: Diagnosis not present

## 2020-12-20 DIAGNOSIS — B182 Chronic viral hepatitis C: Secondary | ICD-10-CM | POA: Diagnosis present

## 2020-12-20 DIAGNOSIS — I330 Acute and subacute infective endocarditis: Secondary | ICD-10-CM | POA: Diagnosis not present

## 2020-12-20 DIAGNOSIS — L0390 Cellulitis, unspecified: Secondary | ICD-10-CM

## 2020-12-20 DIAGNOSIS — B3321 Viral endocarditis: Secondary | ICD-10-CM | POA: Diagnosis not present

## 2020-12-20 DIAGNOSIS — Z8349 Family history of other endocrine, nutritional and metabolic diseases: Secondary | ICD-10-CM | POA: Diagnosis not present

## 2020-12-20 DIAGNOSIS — L739 Follicular disorder, unspecified: Secondary | ICD-10-CM | POA: Diagnosis present

## 2020-12-20 DIAGNOSIS — Z23 Encounter for immunization: Secondary | ICD-10-CM | POA: Diagnosis not present

## 2020-12-20 DIAGNOSIS — F1110 Opioid abuse, uncomplicated: Secondary | ICD-10-CM | POA: Diagnosis present

## 2020-12-20 DIAGNOSIS — I340 Nonrheumatic mitral (valve) insufficiency: Secondary | ICD-10-CM | POA: Diagnosis not present

## 2020-12-20 DIAGNOSIS — O98419 Viral hepatitis complicating pregnancy, unspecified trimester: Secondary | ICD-10-CM | POA: Diagnosis not present

## 2020-12-20 DIAGNOSIS — A4102 Sepsis due to Methicillin resistant Staphylococcus aureus: Secondary | ICD-10-CM | POA: Diagnosis present

## 2020-12-20 DIAGNOSIS — Z818 Family history of other mental and behavioral disorders: Secondary | ICD-10-CM | POA: Diagnosis not present

## 2020-12-20 DIAGNOSIS — Z8249 Family history of ischemic heart disease and other diseases of the circulatory system: Secondary | ICD-10-CM | POA: Diagnosis not present

## 2020-12-20 DIAGNOSIS — B1710 Acute hepatitis C without hepatic coma: Secondary | ICD-10-CM | POA: Diagnosis not present

## 2020-12-20 DIAGNOSIS — F1410 Cocaine abuse, uncomplicated: Secondary | ICD-10-CM | POA: Diagnosis present

## 2020-12-20 LAB — HEPATITIS PANEL, ACUTE
HCV Ab: REACTIVE — AB
Hep A IgM: NONREACTIVE
Hep B C IgM: NONREACTIVE
Hepatitis B Surface Ag: NONREACTIVE

## 2020-12-20 LAB — URINALYSIS, ROUTINE W REFLEX MICROSCOPIC
Bilirubin Urine: NEGATIVE
Glucose, UA: NEGATIVE mg/dL
Hgb urine dipstick: NEGATIVE
Ketones, ur: NEGATIVE mg/dL
Nitrite: NEGATIVE
Protein, ur: NEGATIVE mg/dL
Specific Gravity, Urine: 1.02 (ref 1.005–1.030)
pH: 7 (ref 5.0–8.0)

## 2020-12-20 LAB — RESP PANEL BY RT-PCR (FLU A&B, COVID) ARPGX2
Influenza A by PCR: NEGATIVE
Influenza B by PCR: NEGATIVE
SARS Coronavirus 2 by RT PCR: NEGATIVE

## 2020-12-20 LAB — URINALYSIS, MICROSCOPIC (REFLEX)

## 2020-12-20 LAB — RAPID URINE DRUG SCREEN, HOSP PERFORMED
Amphetamines: POSITIVE — AB
Barbiturates: NOT DETECTED
Benzodiazepines: NOT DETECTED
Cocaine: NOT DETECTED
Opiates: NOT DETECTED
Tetrahydrocannabinol: NOT DETECTED

## 2020-12-20 LAB — ACETAMINOPHEN LEVEL: Acetaminophen (Tylenol), Serum: <10 ug/mL — ABNORMAL LOW (ref 10–30)

## 2020-12-20 LAB — PREGNANCY, URINE: Preg Test, Ur: NEGATIVE

## 2020-12-20 LAB — SALICYLATE LEVEL: Salicylate Lvl: <7 mg/dL — ABNORMAL LOW (ref 7.0–30.0)

## 2020-12-20 LAB — MRSA PCR SCREENING: MRSA by PCR: POSITIVE — AB

## 2020-12-20 LAB — C-REACTIVE PROTEIN: CRP: 6.3 mg/dL — ABNORMAL HIGH (ref <1.0)

## 2020-12-20 LAB — SEDIMENTATION RATE: Sed Rate: 40 mm/hr — ABNORMAL HIGH (ref 0–22)

## 2020-12-20 MED ORDER — ONDANSETRON HCL 4 MG PO TABS: 4.0000 mg | ORAL_TABLET | Freq: Four times a day (QID) | ORAL | Status: DC | PRN

## 2020-12-20 MED ORDER — IBUPROFEN 400 MG PO TABS
400.0000 mg | ORAL_TABLET | Freq: Four times a day (QID) | ORAL | Status: DC | PRN
  Administered 2020-12-20 (×2): 400 mg via ORAL
  Filled 2020-12-20 (×3): qty 1

## 2020-12-20 MED ORDER — CHLORHEXIDINE GLUCONATE CLOTH 2 % EX PADS
6.0000 | MEDICATED_PAD | Freq: Every day | CUTANEOUS | Status: AC
  Administered 2020-12-21 – 2020-12-25 (×3): 6 via TOPICAL

## 2020-12-20 MED ORDER — ACETAMINOPHEN 650 MG RE SUPP: 650.0000 mg | Freq: Four times a day (QID) | RECTAL | Status: DC | PRN

## 2020-12-20 MED ORDER — SODIUM CHLORIDE 0.9% FLUSH
3.0000 mL | Freq: Two times a day (BID) | INTRAVENOUS | Status: DC
  Administered 2020-12-20 – 2020-12-25 (×5): 3 mL via INTRAVENOUS

## 2020-12-20 MED ORDER — ACETAMINOPHEN 325 MG PO TABS
650.0000 mg | ORAL_TABLET | Freq: Four times a day (QID) | ORAL | Status: DC | PRN
  Administered 2021-01-03: 650 mg via ORAL
  Filled 2020-12-20: qty 2

## 2020-12-20 MED ORDER — ONDANSETRON HCL 4 MG/2ML IJ SOLN: 4.0000 mg | Freq: Four times a day (QID) | INTRAMUSCULAR | Status: DC | PRN

## 2020-12-20 MED ORDER — SODIUM CHLORIDE 0.9 % IV SOLN: INTRAVENOUS | Status: DC | PRN

## 2020-12-20 MED ORDER — IBUPROFEN 800 MG PO TABS
800.0000 mg | ORAL_TABLET | Freq: Once | ORAL | Status: AC
  Administered 2020-12-20: 05:00:00 800 mg via ORAL
  Filled 2020-12-20: qty 1

## 2020-12-20 MED ORDER — MUPIROCIN 2 % EX OINT
1.0000 "application " | TOPICAL_OINTMENT | Freq: Two times a day (BID) | CUTANEOUS | Status: AC
  Administered 2020-12-20 – 2020-12-25 (×10): 1 via NASAL
  Filled 2020-12-20 (×2): qty 22

## 2020-12-20 MED ORDER — ENOXAPARIN SODIUM 40 MG/0.4ML ~~LOC~~ SOLN
40.0000 mg | SUBCUTANEOUS | Status: DC
  Administered 2020-12-20 – 2021-01-03 (×15): 40 mg via SUBCUTANEOUS
  Filled 2020-12-20 (×15): qty 0.4

## 2020-12-20 MED ORDER — ALBUTEROL SULFATE (2.5 MG/3ML) 0.083% IN NEBU: 2.5000 mg | INHALATION_SOLUTION | Freq: Four times a day (QID) | RESPIRATORY_TRACT | Status: DC | PRN

## 2020-12-20 NOTE — ED Notes (Signed)
IV ancef completion

## 2020-12-20 NOTE — Progress Notes (Signed)
Lab notified MRSA by PCR positive result. Standing order for positive MRSA initiated.  Filiberto Pinks, RN

## 2020-12-21 ENCOUNTER — Inpatient Hospital Stay (HOSPITAL_COMMUNITY): Payer: Medicaid Other

## 2020-12-21 ENCOUNTER — Other Ambulatory Visit (HOSPITAL_COMMUNITY): Payer: Medicaid Other

## 2020-12-21 DIAGNOSIS — I361 Nonrheumatic tricuspid (valve) insufficiency: Secondary | ICD-10-CM

## 2020-12-21 DIAGNOSIS — B9562 Methicillin resistant Staphylococcus aureus infection as the cause of diseases classified elsewhere: Secondary | ICD-10-CM

## 2020-12-21 DIAGNOSIS — R21 Rash and other nonspecific skin eruption: Secondary | ICD-10-CM

## 2020-12-21 DIAGNOSIS — I330 Acute and subacute infective endocarditis: Secondary | ICD-10-CM

## 2020-12-21 DIAGNOSIS — R7881 Bacteremia: Secondary | ICD-10-CM

## 2020-12-21 DIAGNOSIS — A419 Sepsis, unspecified organism: Secondary | ICD-10-CM

## 2020-12-21 DIAGNOSIS — I38 Endocarditis, valve unspecified: Secondary | ICD-10-CM

## 2020-12-21 LAB — COMPREHENSIVE METABOLIC PANEL
ALT: 47 U/L — ABNORMAL HIGH (ref 0–44)
AST: 48 U/L — ABNORMAL HIGH (ref 15–41)
Albumin: 3.3 g/dL — ABNORMAL LOW (ref 3.5–5.0)
Alkaline Phosphatase: 61 U/L (ref 38–126)
Anion gap: 12 (ref 5–15)
BUN: 16 mg/dL (ref 6–20)
CO2: 25 mmol/L (ref 22–32)
Calcium: 8.8 mg/dL — ABNORMAL LOW (ref 8.9–10.3)
Chloride: 98 mmol/L (ref 98–111)
Creatinine, Ser: 1.06 mg/dL — ABNORMAL HIGH (ref 0.44–1.00)
GFR, Estimated: >60 mL/min (ref >60)
Glucose, Bld: 93 mg/dL (ref 70–99)
Potassium: 4.3 mmol/L (ref 3.5–5.1)
Sodium: 135 mmol/L (ref 135–145)
Total Bilirubin: 1 mg/dL (ref 0.3–1.2)
Total Protein: 6.6 g/dL (ref 6.5–8.1)

## 2020-12-21 LAB — BLOOD CULTURE ID PANEL (REFLEXED) - BCID2
A.calcoaceticus-baumannii: NOT DETECTED
Bacteroides fragilis: NOT DETECTED
Candida albicans: NOT DETECTED
Candida auris: NOT DETECTED
Candida glabrata: NOT DETECTED
Candida krusei: NOT DETECTED
Candida parapsilosis: NOT DETECTED
Candida tropicalis: NOT DETECTED
Cryptococcus neoformans/gattii: NOT DETECTED
Enterobacter cloacae complex: NOT DETECTED
Enterobacterales: NOT DETECTED
Enterococcus Faecium: NOT DETECTED
Enterococcus faecalis: NOT DETECTED
Escherichia coli: NOT DETECTED
Haemophilus influenzae: NOT DETECTED
Klebsiella aerogenes: NOT DETECTED
Klebsiella oxytoca: NOT DETECTED
Klebsiella pneumoniae: NOT DETECTED
Listeria monocytogenes: NOT DETECTED
Meth resistant mecA/C and MREJ: DETECTED — AB
Methicillin resistance mecA/C: DETECTED — AB
Neisseria meningitidis: NOT DETECTED
Proteus species: NOT DETECTED
Pseudomonas aeruginosa: NOT DETECTED
Salmonella species: NOT DETECTED
Serratia marcescens: NOT DETECTED
Staphylococcus aureus (BCID): DETECTED — AB
Staphylococcus epidermidis: DETECTED — AB
Staphylococcus lugdunensis: NOT DETECTED
Staphylococcus species: DETECTED — AB
Stenotrophomonas maltophilia: NOT DETECTED
Streptococcus agalactiae: NOT DETECTED
Streptococcus pneumoniae: NOT DETECTED
Streptococcus pyogenes: NOT DETECTED
Streptococcus species: NOT DETECTED

## 2020-12-21 LAB — CBC
HCT: 35 % — ABNORMAL LOW (ref 36.0–46.0)
Hemoglobin: 11.6 g/dL — ABNORMAL LOW (ref 12.0–15.0)
MCH: 31.4 pg (ref 26.0–34.0)
MCHC: 33.1 g/dL (ref 30.0–36.0)
MCV: 94.9 fL (ref 80.0–100.0)
Platelets: 315 10*3/uL (ref 150–400)
RBC: 3.69 MIL/uL — ABNORMAL LOW (ref 3.87–5.11)
RDW: 12.7 % (ref 11.5–15.5)
WBC: 10.3 10*3/uL (ref 4.0–10.5)
nRBC: 0 % (ref 0.0–0.2)

## 2020-12-21 LAB — ECHOCARDIOGRAM COMPLETE
Area-P 1/2: 3.85 cm2
Height: 60 in
S' Lateral: 2.4 cm
Weight: 1984 oz

## 2020-12-21 MED ORDER — VANCOMYCIN HCL IN DEXTROSE 1-5 GM/200ML-% IV SOLN
1000.0000 mg | INTRAVENOUS | Status: AC
  Administered 2020-12-21: 06:00:00 1000 mg via INTRAVENOUS
  Filled 2020-12-21 (×2): qty 200

## 2020-12-21 MED ORDER — VANCOMYCIN HCL 750 MG/150ML IV SOLN
750.0000 mg | Freq: Two times a day (BID) | INTRAVENOUS | Status: DC
  Administered 2020-12-21 – 2020-12-23 (×5): 750 mg via INTRAVENOUS
  Filled 2020-12-21 (×6): qty 150

## 2020-12-21 MED ORDER — LORAZEPAM 2 MG/ML IJ SOLN
0.5000 mg | INTRAMUSCULAR | Status: DC | PRN
Start: 2020-12-21 — End: 2021-01-04

## 2020-12-21 MED ORDER — NICOTINE 14 MG/24HR TD PT24
14.0000 mg | MEDICATED_PATCH | Freq: Every day | TRANSDERMAL | Status: DC
  Administered 2020-12-21 – 2021-01-03 (×12): 14 mg via TRANSDERMAL
  Filled 2020-12-21 (×15): qty 1

## 2020-12-21 MED ORDER — SODIUM CHLORIDE 0.9 % IV SOLN: INTRAVENOUS | Status: DC

## 2020-12-21 MED ORDER — IOHEXOL 300 MG/ML  SOLN
75.0000 mL | Freq: Once | INTRAMUSCULAR | Status: AC | PRN
  Administered 2020-12-21: 15:00:00 75 mL via INTRAVENOUS

## 2020-12-21 NOTE — Consult Note (Addendum)
[facility]    Date of Admission:  12/19/2020     Reason for Consult: rash, mssa bacteremia, hx tv endocarditis    Referring Provider: Doristine Bosworth     Lines:  Peripheral iv's  Abx: 12/23-c cefazolin  12/24 vanc        Assessment: 29 y.o. 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 I am surprised she did ok until this point. In 04/2020 had GAS septic shock; bcx no mrsa, and didn't have any discharge abx either. She came today with mrsa bacteremia again, and I would say this is probably a new infection not relapse given the time interval since 09/2019. Of note, uds again was positive for amphetamine although she reports not using for 4 months  She has diffuse superficial ulcerating rash that started as pimples; some are pustules and carbuncles. 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 It doesn't appear to be embolic/ecthyma. It doesn't appear to be vasculitic/cryo (she does have hep c) or herpetic either. Will check for syphilis though given myriad presentation potential   12/23 bcx negative 12/24 bcx positive mrsa by cepheid (mec a/c and MREJ positive); also has staph epi Acute hep a/b panel negative Hep c ab positive; 09/2019 positive viral load for hep c; likely chronic hep c  Concerning on exam is the bulbar conjuncitva injection of the left eye. There is a developing carbuncle on top of the periorbital region with periorbital swelling as well. I worry about endophthalmitis from [facility], although it doesn't appear as angry as one would expect.   Of note, her uds show amphetamine, and she reports that is due to her adderal. She  hasn't used any drug of any form since 08/15/2020  Plan: 1. Pending tte 2. Repeat bcx tomorrow 2 sets 3. Check hep c rna and hiv 4. Check rpr/treponemal antibody 5. Continue vancomycin 6. Would discuss with ophthalmology for left eye evaluation r/o endophthalmitis, as if that is the case she'll need intravitrious injection of abx 7. I will order orbital ct with contrast as well to look for preseptal involvement   Principal Problem:   Sepsis ([place]) Active Problems:   Hepatitis C, chronic, maternal, antepartum ([place])   Cigarette smoker   Polysubstance dependence including opioid type drug without complication, episodic abuse ([place])   Endocarditis   Rash and nonspecific skin eruption   Scheduled Meds: . Chlorhexidine Gluconate Cloth  6 each Topical Q0600  . enoxaparin (LOVENOX) injection  40 mg Subcutaneous Q24H  . mupirocin ointment  1 application Nasal BID  . nicotine  14 mg Transdermal Daily  . sodium chloride flush  3 mL Intravenous Q12H   Continuous Infusions: . sodium chloride 10 mL/hr at 12/20/20 2115  . sodium chloride Stopped (12/21/20 4765)  . vancomycin     PRN Meds:.sodium chloride, acetaminophen **OR** acetaminophen, albuterol, ibuprofen, LORazepam, ondansetron **OR** ondansetron (ZOFRAN) IV  HPI: [name] is a 29 y.o. female pmh polysubstance abuse (IV heroin, cocaine, marijuana, methamphetamines, and tobacco), hx tricuspid valve endocarditis and  group A strep 04/2020, admitted 12/23 with complaints of recurrent skin infections all over her body, found to have mrsa bacteremia  Patient had  Had rash like this in the past several times. Most recently started 4 days pta. Rash initially started as pimples that would develop into carbuncles/abscesses. There is involvement of pustules on her left upper eyelid  Denies f/c, joint pain, back pain, cough/sob, chest pain, headache, visual disturbance, unilateral weakness, or hematuria  She does report fatigue  chronically  No body that she lives with has these rash  In 04/2020 she was admitted for septic shock group a strep bacteremia, but left AMA after a couple days. She had persistent tricuspid valve lesion on tte. No abx given this admission on AMA In 09/2019 she was admitted for mrsa tricuspid valve endocarditis. She left AMA less than a week into treatment. zyvox was given for 8 weeks but she didn't feel it. She never had any ID outpatient follow up  Other id issues: Chronic hep c no previous tx; noted 09/2019 Previous false positive rpr 09/2019 (negtive treponemal antibody)  She was recently in jail for 4 months, and discharged 07/2018 and reported was clean from drugs since that incarceration   Hospital course: Admission afebrile/ hds but hr 140s Wbc 12.5; lft slightly elevated crp 6.3; esr 40 Flu/covid screen negative uds positive for amphetamine cxr clear 12/23 bcx negative; initially empiric cefazolin started 12/24 bcx mrsa 12/25 abx switched to vancomycin   Review of Systems: ROS Other ros negative  Past Medical History:  Diagnosis Date  . History of RPR test 07/21/2016   02/2016: 1:1 with negative TPA testing '[ ]'  f/u 28wks '[ ]'  f/u admit RPR '[ ]'  f/u PP RPR  . History of self-harm   . Opioid abuse ([place])   . Polysubstance abuse ([place])     Social History   Tobacco Use  . Smoking status: Current Every Day Smoker    Packs/day: 0.50    Types: Cigarettes  . Smokeless tobacco: Never Used  Substance Use Topics  . Alcohol use: No  . Drug use: Yes    Frequency: 5.0 times per week    Types: Cocaine, Marijuana, Methamphetamines    Comment: pt states that she has "been clean since Aug 19th 2021"    Family History  Problem Relation Age of Onset  . Vision loss Mother   . Depression Mother   . Ulcers Mother   . Gallbladder disease Mother   . Mental illness Mother   . Thyroid disease Mother   . Allergies Father   . Vision loss Father   . Vision loss Maternal  Grandmother   . Ulcers Maternal Grandmother   . Gallbladder disease Maternal Grandmother   . Diabetes Maternal Grandmother   . Vision loss Maternal Grandfather   . Heart disease Maternal Grandfather   . Cancer Maternal Grandfather   . Allergies Paternal Grandmother   . Vision loss Paternal Grandmother   . Gallbladder disease Paternal Grandmother   . Arthritis Paternal Grandmother   . Cancer Paternal Grandmother   . Allergies Paternal Grandfather   . Vision loss Paternal Grandfather    Allergies  Allergen Reactions  . Dextromethorphan Swelling    Facial swelling    OBJECTIVE: Blood pressure 117/79, pulse 73, temperature 97.9 F (36.6 C), temperature source Oral, resp. rate 16, height 5' (1.524 m), weight 56.2 kg, last menstrual period 11/27/2020, SpO2 98 %.  Physical Exam No distress, well developed, conversant, cooperative Heent: normocephalic except rash mentioned below; per; left conj mild injection up to limbus; no obvious hypopion or debrides visualized in anterior chamber; eomi without tenderness; orohparynx clear  Skin: diffuse superficial ulcerating rash with new eschar on face, trunk/extremities sparing palm/soles; nails have polish and can't tell if involved; some lesion are pustules/carbunckles; the left eye lid has a pustule with significant conj swelling cv rrr soft murmur Lungs clear; normal respiratory effort abd s/nt Ext no edema msk no back tenderness or peripheral joint swelling/tenderness Neuro cn 2-12 intact, normal strength/reflex Psych alert/oriented  Lab Results Lab Results  Component Value Date   WBC 10.3 12/21/2020   HGB 11.6 (L) 12/21/2020   HCT 35.0 (L) 12/21/2020   MCV 94.9 12/21/2020   PLT 315 12/21/2020    Lab Results  Component Value Date   CREATININE 1.06 (H) 12/21/2020   BUN 16 12/21/2020   NA 135 12/21/2020   K 4.3 12/21/2020   CL 98 12/21/2020   CO2 25 12/21/2020    Lab Results  Component Value Date   ALT 47 (H) 12/21/2020    AST 48 (H) 12/21/2020   ALKPHOS 61 12/21/2020   BILITOT 1.0 12/21/2020     Microbiology: Recent Results (from the past 240 hour(s))  Blood culture (routine x 2)     Status: None (Preliminary result)   Collection Time: 12/19/20 11:00 PM   Specimen: Left Antecubital; Blood  Result Value Ref Range Status   Specimen Description   Final    LEFT ANTECUBITAL Performed at [facility], [address] [place], [place] [address]    Special Requests   Final    BOTTLES DRAWN AEROBIC AND ANAEROBIC Blood Culture adequate volume Performed at [facility], [address] [place], [place] [address]    Culture   Final    NO GROWTH < 12 HOURS Performed at [facility], [place] [address], [place], [place] [address]    Report Status PENDING  Incomplete  Blood culture (routine x 2)     Status: None (Preliminary result)   Collection Time: 12/19/20 11:09 PM   Specimen: BLOOD RIGHT FOREARM  Result Value Ref Range Status   Specimen Description   Final    BLOOD RIGHT FOREARM Performed at [facility], [address] [place], [place] [address]    Special Requests   Final    BOTTLES DRAWN AEROBIC AND ANAEROBIC Blood Culture results may not be optimal due to an excessive volume of blood received in culture bottles Performed at [facility], [address] [place], [place] [address]    Culture  Setup Time   Final    GRAM POSITIVE COCCI AEROBIC BOTTLE ONLY CRITICAL VALUE NOTED.  VALUE IS CONSISTENT WITH PREVIOUSLY REPORTED AND CALLED VALUE. Performed at [facility], [place] [address] [place], [place] [address]    Culture GRAM POSITIVE COCCI  Final   Report Status PENDING  Incomplete  Resp Panel by RT-PCR (Flu A&B, Covid) Nasopharyngeal Swab     Status: None   Collection Time: 12/19/20 11:54 PM   Specimen: Nasopharyngeal Swab; Nasopharyngeal(NP) swabs in vial transport medium  Result Value Ref Range Status   SARS Coronavirus 2 by RT PCR NEGATIVE  NEGATIVE Final    Comment: (NOTE) SARS-CoV-2 target nucleic acids are NOT DETECTED.  The SARS-CoV-2 RNA is generally detectable in upper respiratory specimens during the acute phase of infection. The lowest concentration of SARS-CoV-2 viral copies this assay can detect is 138 copies/mL. A negative result does not preclude SARS-Cov-2 infection and should not be used as the sole basis for treatment or other  patient management decisions. A negative result may occur with  improper specimen collection/handling, submission of specimen other than nasopharyngeal swab, presence of viral mutation(s) within the areas targeted by this assay, and inadequate number of viral copies(<138 copies/mL). A negative result must be combined with clinical observations, patient history, and epidemiological information. The expected result is Negative.  Fact Sheet for Patients:  EntrepreneurPulse.com.au  Fact Sheet for Healthcare Providers:  IncredibleEmployment.be  This test is no t yet approved or cleared by the [place] FDA and  has been authorized for detection and/or diagnosis of SARS-CoV-2 by FDA under an Emergency Use Authorization (EUA). This EUA will remain  in effect (meaning this test can be used) for the duration of the COVID-19 declaration under Section 564(b)(1) of the Act, 21 U.S.C.section 360bbb-3(b)(1), unless the authorization is terminated  or revoked sooner.       Influenza A by PCR NEGATIVE NEGATIVE Final   Influenza B by PCR NEGATIVE NEGATIVE Final    Comment: (NOTE) The Xpert Xpress SARS-CoV-2/FLU/RSV plus assay is intended as an aid in the diagnosis of influenza from Nasopharyngeal swab specimens and should not be used as a sole basis for treatment. Nasal washings and aspirates are unacceptable for Xpert Xpress SARS-CoV-2/FLU/RSV testing.  Fact Sheet for Patients: EntrepreneurPulse.com.au  Fact Sheet for Healthcare  Providers: IncredibleEmployment.be  This test is not yet approved or cleared by the [place] FDA and has been authorized for detection and/or diagnosis of SARS-CoV-2 by FDA under an Emergency Use Authorization (EUA). This EUA will remain in effect (meaning this test can be used) for the duration of the COVID-19 declaration under Section 564(b)(1) of the Act, 21 U.S.C. section 360bbb-3(b)(1), unless the authorization is terminated or revoked.  Performed at [facility], [address] [place], [place] [address]   Culture, blood (single)     Status: None (Preliminary result)   Collection Time: 12/20/20  4:53 AM   Specimen: Left Antecubital; Blood  Result Value Ref Range Status   Specimen Description   Final    LEFT ANTECUBITAL Performed at [facility], [address] [place], [place] [address]    Special Requests   Final    BOTTLES DRAWN AEROBIC AND ANAEROBIC Blood Culture adequate volume Performed at [facility], [address] [place], [place] [address]    Culture  Setup Time   Final    IN BOTH AEROBIC AND ANAEROBIC BOTTLES GRAM POSITIVE COCCI Organism ID to follow CRITICAL RESULT CALLED TO, READ BACK BY AND VERIFIED WITH: C AMEND PHARMD 12/21/20 0228 JDW    Culture   Final    GRAM POSITIVE COCCI CULTURE REINCUBATED FOR BETTER GROWTH Performed at [facility], [place] [address], [place], [place] [address]    Report Status PENDING  Incomplete  Blood Culture ID Panel (Reflexed)     Status: Abnormal   Collection Time: 12/20/20  4:53 AM  Result Value Ref Range Status   Enterococcus faecalis NOT DETECTED NOT DETECTED Final   Enterococcus Faecium NOT DETECTED NOT DETECTED Final   Listeria monocytogenes NOT DETECTED NOT DETECTED Final   Staphylococcus species DETECTED (A) NOT DETECTED Final    Comment: CRITICAL RESULT CALLED TO, READ BACK BY AND VERIFIED WITH: C AMEND PHARMD 12/21/20 0228 JDW    Staphylococcus  aureus (BCID) DETECTED (A) NOT DETECTED Final    Comment: Methicillin (oxacillin)-resistant Staphylococcus aureus (MRSA). MRSA is predictably resistant to beta-lactam antibiotics (except ceftaroline). Preferred therapy is vancomycin  unless clinically contraindicated. Patient requires contact precautions if  hospitalized. CRITICAL RESULT CALLED TO, READ BACK BY AND VERIFIED WITH: C AMEND PHARMD 12/21/20 0228 JDW    Staphylococcus epidermidis DETECTED (A) NOT DETECTED Final    Comment: CRITICAL RESULT CALLED TO, READ BACK BY AND VERIFIED WITH: C AMEND PHARMD 12/21/20 0228 JDW    Staphylococcus lugdunensis NOT DETECTED NOT DETECTED Final   Streptococcus species NOT DETECTED NOT DETECTED Final   Streptococcus agalactiae NOT DETECTED NOT DETECTED Final   Streptococcus pneumoniae NOT DETECTED NOT DETECTED Final   Streptococcus pyogenes NOT DETECTED NOT DETECTED Final   A.calcoaceticus-baumannii NOT DETECTED NOT DETECTED Final   Bacteroides fragilis NOT DETECTED NOT DETECTED Final   Enterobacterales NOT DETECTED NOT DETECTED Final   Enterobacter cloacae complex NOT DETECTED NOT DETECTED Final   Escherichia coli NOT DETECTED NOT DETECTED Final   Klebsiella aerogenes NOT DETECTED NOT DETECTED Final   Klebsiella oxytoca NOT DETECTED NOT DETECTED Final   Klebsiella pneumoniae NOT DETECTED NOT DETECTED Final   Proteus species NOT DETECTED NOT DETECTED Final   Salmonella species NOT DETECTED NOT DETECTED Final   Serratia marcescens NOT DETECTED NOT DETECTED Final   Haemophilus influenzae NOT DETECTED NOT DETECTED Final   Neisseria meningitidis NOT DETECTED NOT DETECTED Final   Pseudomonas aeruginosa NOT DETECTED NOT DETECTED Final   Stenotrophomonas maltophilia NOT DETECTED NOT DETECTED Final   Candida albicans NOT DETECTED NOT DETECTED Final   Candida auris NOT DETECTED NOT DETECTED Final   Candida glabrata NOT DETECTED NOT DETECTED Final   Candida krusei NOT DETECTED NOT DETECTED Final    Candida parapsilosis NOT DETECTED NOT DETECTED Final   Candida tropicalis NOT DETECTED NOT DETECTED Final   Cryptococcus neoformans/gattii NOT DETECTED NOT DETECTED Final   Methicillin resistance mecA/C DETECTED (A) NOT DETECTED Final    Comment: CRITICAL RESULT CALLED TO, READ BACK BY AND VERIFIED WITH: C AMEND PHARMD 12/21/20 0228 JDW    Meth resistant mecA/C and MREJ DETECTED (A) NOT DETECTED Final    Comment: CRITICAL RESULT CALLED TO, READ BACK BY AND VERIFIED WITH: C AMEND PHARMD 12/21/20 0228 JDW Performed at [facility], [address], [place], [place] [address]   MRSA PCR Screening     Status: Abnormal   Collection Time: 12/20/20  6:15 PM   Specimen: Nasal Mucosa; Nasopharyngeal  Result Value Ref Range Status   MRSA by PCR POSITIVE (A) NEGATIVE Final    Comment:        The GeneXpert MRSA Assay (FDA approved for NASAL specimens only), is one component of a comprehensive MRSA colonization surveillance program. It is not intended to diagnose MRSA infection nor to guide or monitor treatment for MRSA infections. RESULT CALLED TO, READ BACK BY AND VERIFIED WITH: RN T. TIE [id number] FCP Performed at [facility] [address] [place], [place] [address]     Darla Mcdonald T Cooper Moroney, [place] for [place] [phone] pager    12/21/2020, 10:33 AM c

## 2020-12-21 NOTE — Consult Note (Signed)
WOC Nurse Consult Note: Reason for Consult:Numerous lesions on the face and neck. Chronic. Wound type: Infectious, trauma Pressure Injury POA: N/A  I have communicated with Dr. Jacqulyn Bath this morning and indicated that the lesions are outside the scope of WOC Nursing. I additionally indicated that ID or if they drain or present as something that might require packing, that [facility] could be an additional resource.  In my experience Dermatology has not typically made inpatient consultation visits with the exception of SJD or similar acute conditions.  WOC nursing team will not follow, but will remain available to this patient, the nursing and medical teams.  Please re-consult if needed. Thanks, Ladona Mow, MSN, RN, GNP, [place]  Pager# [phone]

## 2020-12-21 NOTE — Progress Notes (Signed)
PROGRESS NOTE    [name]  GYI:[id number] DOB: 1991-07-07 DOA: 12/19/2020 PCP: Pcp, No   Brief Narrative:  HPI: [name] is a 29 y.o. 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  She has had similar symptoms like this previously in the past.  Current episode started about 4 days ago.  Initially rash started out as little pimples that sometimes pop on their own or develop into abscesses.  Denies any significant fever, chills, chest pain, cough, shortness of breath, leg swelling, abdominal pain, or blood in stool.  Last admitted into the hospital in May of this year for septic shock with tricuspid valve endocarditis related to strep pyrogens bacteremia. Patient reports that she only stayed in hospital couple days prior to leaving [place] and never followed up with any provider. She claims to be clean from IV drugs for 120 days after getting out of jail on August 19.   ED Course: Upon admission into the emergency department patient was seen to be afebrile, pulse elevated up to 143, and all other vital signs maintained.  Labs significant for WBC 12.5, sodium 133, AST 43, ALT 59, lactic acid 1.7, CRP 6.3, sed rate 40.  Influenza and COVID-19 screening were both negative.  Urine drug screen was positive for amphetamines.  Chest x-ray showed no acute abnormalities.  ID had been formally consulted and recommended checking 3 sets of blood cultures starting cefazolin based off previous culture data from May.  Assessment & Plan:   Principal Problem:   Sepsis ([place]) Active Problems:   Hepatitis C, chronic, maternal, antepartum ([place])   Cigarette smoker   Polysubstance dependence including opioid type drug without complication, episodic abuse ([place])    Endocarditis   Rash and nonspecific skin eruption   Sepsis secondary to possible endocarditis: Patient met sepsis criteria due to tachycardic with white blood cell count elevated up to 12.5 meeting SIRS criteria. Previously positive for group A strep (strep pyogenes) when admitted back in May with tricuspid valve endocarditis, but left AMA prior to receiving full recommended treatment. CRP was elevated at 6.3 and sedimentation rate was 40. ID had been consulted and awaiting further recommendations.  Continue current antibiotics in the meantime.    Unknown skin lesions acute. Patient presents with complaints of multiple skin ulcerations mostly concentrated on the face and neck.  Seem to be carbuncles.  ID pursuing further work-up for syphilis and others.  Asymptomatic bacteriuria: Does not need antibiotics for this.  Hyponatremia: Resolved.  Elevated transaminases  history of chronic hepatitis C: Acute.  LFTs are stable.  Polysubstance abuse: Patient reports being clean from IV drug use for the last 120 days. Urine drug screen was positive for amphetamines. Prior history of abuse also includes heroin, cocaine, and marijuana. -Continue to monitor for withdrawal symptoms.  Ativan IV if needed -Continue counseling on need of cessation of drug use  Possible left endophthalmitis: Per ID, patient may have some endophthalmitis.  I have consulted Dr. Bevis/ophthalmology who is going to see patient today.  Mild AKI: Secondary to sepsis.  Continue gentle hydration.  Repeat labs in the morning.  DVT prophylaxis: enoxaparin (LOVENOX) injection 40 mg Start: 12/20/20 1700   Code Status: Full Code  Family Communication: Patient's mother present at bedside.  Plan of care discussed with patient in length and her  mother and he verbalized understanding and agreed with it.  Status is: Inpatient  Remains inpatient appropriate because:Inpatient level of care appropriate due to severity of  illness   Dispo: The patient is from: Home              Anticipated d/c is to: Home              Anticipated d/c date is: > 3 days              Patient currently is not medically stable to d/c.        Estimated body mass index is 24.22 kg/m as calculated from the following:   Height as of this encounter: 5' (1.524 m).   Weight as of this encounter: 56.2 kg.      Nutritional status:               Consultants:   ID  After neurology  Procedures:   None  Antimicrobials:  Anti-infectives (From admission, onward)   Start     Dose/Rate Route Frequency Ordered Stop   12/21/20 1600  vancomycin (VANCOREADY) IVPB 750 mg/150 mL        750 mg 150 mL/hr over 60 Minutes Intravenous Every 12 hours 12/21/20 0249     12/21/20 0300  vancomycin (VANCOCIN) IVPB 1000 mg/200 mL premix        1,000 mg 200 mL/hr over 60 Minutes Intravenous STAT 12/21/20 0249 12/21/20 0711   12/20/20 0600  ceFAZolin (ANCEF) IVPB 1 g/50 mL premix  Status:  Discontinued        1 g 100 mL/hr over 30 Minutes Intravenous Every 8 hours 12/19/20 2254 12/21/20 0242         Subjective: Patient seen and examined.  Her mother was at the bedside.  She stated that she was feeling better.  She tells me that she was able to open her left eye much more than yesterday and believes that her antibiotics are working.  She denied having any problem with the vision.  No other complaint.  Her mother claimed that this time she is committed to complete antibiotic therapy even if she has to stay in the hospital for several weeks.  Objective: Vitals:   12/21/20 0020 12/21/20 0436 12/21/20 0613 12/21/20 0816  BP: 128/86  107/69 117/79  Pulse: (!) 116  71 73  Resp: _0 Temp: 98.7 F (37.1 C) 98.5 F (36.9 C) 98.5 F (36.9 C) 97.9 F (36.6 C)  TempSrc: Oral Oral Oral Oral  SpO2: 95%  97% 98%  Weight:      Height:        Intake/Output Summary (Last 24 hours) at 12/21/2020 1256 Last data filed at  12/21/2020 1013 Gross per 24 hour  Intake 626.7 ml  Output --  Net 626.7 ml   Filed Weights   12/19/20 1634  Weight: 56.2 kg    Examination:  General exam: Appears calm and comfortable  Respiratory system: Clear to auscultation. Respiratory effort normal. Cardiovascular system: S1 & S2 heard, RRR. No JVD,  rubs, gallops or clicks. No pedal edema. Gastrointestinal system: Abdomen is nondistended, soft and nontender. No organomegaly or masses felt. Normal bowel sounds heard. Central nervous system: Alert and oriented. No focal neurological deficits. Extremities: Symmetric 5 x 5 power. Skin: Multiple carbuncle-like lesions on her body, very sparse in extremities but very much concentrated on the face. Psychiatry: Judgement and insight appear normal. Mood & affect appropriate.    Data  Reviewed: I have personally reviewed following labs and imaging studies  CBC: Recent Labs  Lab 12/19/20 1647 12/21/20 0104  WBC 12.5* 10.3  NEUTROABS 8.4*  --   HGB 12.7 11.6*  HCT 38.0 35.0*  MCV 94.3 94.9  PLT 344 809   Basic Metabolic Panel: Recent Labs  Lab 12/19/20 1647 12/21/20 0104  NA 133* 135  K 3.9 4.3  CL 96* 98  CO2 26 25  GLUCOSE 115* 93  BUN 12 16  CREATININE 0.90 1.06*  CALCIUM 8.9 8.8*   GFR: Estimated Creatinine Clearance: 61 mL/min (A) (by C-G formula based on SCr of 1.06 mg/dL (H)). Liver Function Tests: Recent Labs  Lab 12/19/20 1647 12/21/20 0104  AST 43* 48*  ALT 59* 47*  ALKPHOS 67 61  BILITOT 0.7 1.0  PROT 7.5 6.6  ALBUMIN 3.9 3.3*   No results for input(s): LIPASE, AMYLASE in the last 168 hours. No results for input(s): AMMONIA in the last 168 hours. Coagulation Profile: No results for input(s): INR, PROTIME in the last 168 hours. Cardiac Enzymes: No results for input(s): CKTOTAL, CKMB, CKMBINDEX, TROPONINI in the last 168 hours. BNP (last 3 results) No results for input(s): PROBNP in the last 8760 hours. HbA1C: No results for input(s):  HGBA1C in the last 72 hours. CBG: No results for input(s): GLUCAP in the last 168 hours. Lipid Profile: No results for input(s): CHOL, HDL, LDLCALC, TRIG, CHOLHDL, LDLDIRECT in the last 72 hours. Thyroid Function Tests: No results for input(s): TSH, T4TOTAL, FREET4, T3FREE, THYROIDAB in the last 72 hours. Anemia Panel: No results for input(s): VITAMINB12, FOLATE, FERRITIN, TIBC, IRON, RETICCTPCT in the last 72 hours. Sepsis Labs: Recent Labs  Lab 12/19/20 1647  LATICACIDVEN 1.7    Recent Results (from the past 240 hour(s))  Blood culture (routine x 2)     Status: None (Preliminary result)   Collection Time: 12/19/20 11:00 PM   Specimen: Left Antecubital; Blood  Result Value Ref Range Status   Specimen Description   Final    LEFT ANTECUBITAL Performed at [facility], [address] [place], [place] [address]    Special Requests   Final    BOTTLES DRAWN AEROBIC AND ANAEROBIC Blood Culture adequate volume Performed at [facility], [address], [place], [place] [address]    Culture   Final    NO GROWTH 1 DAY Performed at [facility], [place] [address], [place], [place] [address]    Report Status PENDING  Incomplete  Blood culture (routine x 2)     Status: None (Preliminary result)   Collection Time: 12/19/20 11:09 PM   Specimen: BLOOD RIGHT FOREARM  Result Value Ref Range Status   Specimen Description   Final    BLOOD RIGHT FOREARM Performed at [facility], [address] [place], [place] [address]    Special Requests   Final    BOTTLES DRAWN AEROBIC AND ANAEROBIC Blood Culture results may not be optimal due to an excessive volume of blood received in culture bottles Performed at [facility], [address] [place], [place] [address]    Culture  Setup Time   Final    GRAM POSITIVE COCCI AEROBIC BOTTLE ONLY CRITICAL VALUE NOTED.  VALUE IS CONSISTENT WITH PREVIOUSLY REPORTED AND CALLED VALUE. Performed at [facility], [place] [address], [place], [place] [address]    Culture [facility] POSITIVE COCCI  Final   Report Status  PENDING  Incomplete  Resp Panel by RT-PCR (Flu A&B, Covid) Nasopharyngeal Swab     Status: None   Collection Time: 12/19/20 11:54 PM   Specimen: Nasopharyngeal Swab; Nasopharyngeal(NP) swabs in vial transport medium  Result Value Ref Range Status   SARS Coronavirus 2 by RT PCR NEGATIVE NEGATIVE Final    Comment: (NOTE) SARS-CoV-2 target nucleic acids are NOT DETECTED.  The SARS-CoV-2 RNA is generally detectable in upper respiratory specimens during the acute phase of infection. The lowest concentration of SARS-CoV-2 viral copies this assay can detect is 138 copies/mL. A negative result does not preclude SARS-Cov-2 infection and should not be used as the sole basis for treatment or other patient management decisions. A negative result may occur with  improper specimen collection/handling, submission of specimen other than nasopharyngeal swab, presence of viral mutation(s) within the areas targeted by this assay, and inadequate number of viral copies(<138 copies/mL). A negative result must be combined with clinical observations, patient history, and epidemiological information. The expected result is Negative.  Fact Sheet for Patients:  EntrepreneurPulse.com.au  Fact Sheet for Healthcare Providers:  IncredibleEmployment.be  This test is no t yet approved or cleared by the [place] FDA and  has been authorized for detection and/or diagnosis of SARS-CoV-2 by FDA under an Emergency Use Authorization (EUA). This EUA will remain  in effect (meaning this test can be used) for the duration of the COVID-19 declaration under Section 564(b)(1) of the Act, 21 U.S.C.section 360bbb-3(b)(1), unless the authorization is terminated  or revoked sooner.       Influenza A by PCR NEGATIVE NEGATIVE Final   Influenza B by PCR NEGATIVE NEGATIVE Final     Comment: (NOTE) The Xpert Xpress SARS-CoV-2/FLU/RSV plus assay is intended as an aid in the diagnosis of influenza from Nasopharyngeal swab specimens and should not be used as a sole basis for treatment. Nasal washings and aspirates are unacceptable for Xpert Xpress SARS-CoV-2/FLU/RSV testing.  Fact Sheet for Patients: EntrepreneurPulse.com.au  Fact Sheet for Healthcare Providers: IncredibleEmployment.be  This test is not yet approved or cleared by the [place] FDA and has been authorized for detection and/or diagnosis of SARS-CoV-2 by FDA under an Emergency Use Authorization (EUA). This EUA will remain in effect (meaning this test can be used) for the duration of the COVID-19 declaration under Section 564(b)(1) of the Act, 21 U.S.C. section 360bbb-3(b)(1), unless the authorization is terminated or revoked.  Performed at [facility], [address] [place], [place] [address]   Culture, blood (single)     Status: None (Preliminary result)   Collection Time: 12/20/20  4:53 AM   Specimen: Left Antecubital; Blood  Result Value Ref Range Status   Specimen Description   Final    LEFT ANTECUBITAL Performed at [facility], [address] [place], [place] [address]    Special Requests   Final    BOTTLES DRAWN AEROBIC AND ANAEROBIC Blood Culture adequate volume Performed at [facility], [address] [place], [place] [address]    Culture  Setup Time   Final    IN BOTH AEROBIC AND ANAEROBIC BOTTLES GRAM POSITIVE COCCI Organism ID to follow CRITICAL RESULT CALLED TO, READ BACK BY AND VERIFIED WITH: C AMEND PHARMD 12/21/20 0228 JDW    Culture   Final    GRAM POSITIVE COCCI CULTURE REINCUBATED FOR BETTER GROWTH Performed at [facility], [place] [address], [place], [place] [address]    Report Status PENDING  Incomplete  Blood Culture ID Panel (Reflexed)     Status: Abnormal   Collection Time: 12/20/20   4:53 AM  Result Value Ref Range Status   Enterococcus faecalis NOT DETECTED NOT DETECTED Final   Enterococcus Faecium NOT DETECTED NOT DETECTED Final   Listeria monocytogenes NOT DETECTED NOT DETECTED Final   Staphylococcus species DETECTED (A) NOT DETECTED Final    Comment: CRITICAL RESULT CALLED TO, READ BACK BY AND VERIFIED WITH: C AMEND PHARMD 12/21/20 0228 JDW    Staphylococcus aureus (BCID) DETECTED (A) NOT DETECTED Final    Comment: Methicillin (oxacillin)-resistant Staphylococcus aureus (MRSA). MRSA is predictably resistant to beta-lactam antibiotics (except ceftaroline). Preferred therapy is vancomycin unless clinically contraindicated. Patient requires contact precautions if  hospitalized. CRITICAL RESULT CALLED TO, READ BACK BY AND VERIFIED WITH: C AMEND PHARMD 12/21/20 0228 JDW    Staphylococcus epidermidis DETECTED (A) NOT DETECTED Final    Comment: CRITICAL RESULT CALLED TO, READ BACK BY AND VERIFIED WITH: C AMEND PHARMD 12/21/20 0228 JDW    Staphylococcus lugdunensis NOT DETECTED NOT DETECTED Final   Streptococcus species NOT DETECTED NOT DETECTED Final   Streptococcus agalactiae NOT DETECTED NOT DETECTED Final   Streptococcus pneumoniae NOT DETECTED NOT DETECTED Final   Streptococcus pyogenes NOT DETECTED NOT DETECTED Final   A.calcoaceticus-baumannii NOT DETECTED NOT DETECTED Final   Bacteroides fragilis NOT DETECTED NOT DETECTED Final   Enterobacterales NOT DETECTED NOT DETECTED Final   Enterobacter cloacae complex NOT DETECTED NOT DETECTED Final   Escherichia coli NOT DETECTED NOT DETECTED Final   Klebsiella aerogenes NOT DETECTED NOT DETECTED Final   Klebsiella oxytoca NOT DETECTED NOT DETECTED Final   Klebsiella pneumoniae NOT DETECTED NOT DETECTED Final   Proteus species NOT DETECTED NOT DETECTED Final   Salmonella species NOT DETECTED NOT DETECTED Final   Serratia marcescens NOT DETECTED NOT DETECTED Final   Haemophilus influenzae NOT DETECTED NOT DETECTED  Final   Neisseria meningitidis NOT DETECTED NOT DETECTED Final   Pseudomonas aeruginosa NOT DETECTED NOT DETECTED Final   Stenotrophomonas maltophilia NOT DETECTED NOT DETECTED Final   Candida albicans NOT DETECTED NOT DETECTED Final   Candida auris NOT DETECTED NOT DETECTED Final   Candida glabrata NOT DETECTED NOT DETECTED Final   Candida krusei NOT DETECTED NOT DETECTED Final   Candida parapsilosis NOT DETECTED NOT DETECTED Final   Candida tropicalis NOT DETECTED NOT DETECTED Final   Cryptococcus neoformans/gattii NOT DETECTED NOT DETECTED Final   Methicillin resistance mecA/C DETECTED (A) NOT DETECTED Final    Comment: CRITICAL RESULT CALLED TO, READ BACK BY AND VERIFIED WITH: C AMEND PHARMD 12/21/20 0228 JDW    Meth resistant mecA/C and MREJ DETECTED (A) NOT DETECTED Final    Comment: CRITICAL RESULT CALLED TO, READ BACK BY AND VERIFIED WITH: C AMEND PHARMD 12/21/20 0228 JDW Performed at [facility], [address] [place], [place] [address]   MRSA PCR Screening     Status: Abnormal   Collection Time: 12/20/20  6:15 PM   Specimen: Nasal Mucosa; Nasopharyngeal  Result Value Ref Range Status   MRSA by PCR POSITIVE (A) NEGATIVE Final    Comment:        The GeneXpert MRSA Assay (FDA approved for NASAL specimens only), is one component of a comprehensive MRSA colonization surveillance program. It is not intended to diagnose MRSA infection nor to guide or monitor treatment for MRSA infections. RESULT CALLED TO, READ BACK BY AND VERIFIED WITH: RN T. TIE [id number] FCP Performed at [facility], [place] [address], [place], [place] [address]       Radiology Studies: DG Chest 2 View  Result Date: 12/19/2020 CLINICAL DATA:  29 year old female with tachycardia. EXAM: CHEST - 2 VIEW COMPARISON:  Chest radiograph dated 05/12/2020. FINDINGS: The heart size and mediastinal contours are within normal limits. Both lungs are clear. The visualized skeletal structures are  unremarkable. IMPRESSION: No active cardiopulmonary disease. Electronically Signed   By: Anner Crete M.D.   On: 12/19/2020 22:12    Scheduled Meds: . Chlorhexidine Gluconate Cloth  6 each Topical Q0600  . enoxaparin (LOVENOX) injection  40 mg Subcutaneous Q24H  . mupirocin ointment  1 application Nasal BID  . nicotine  14 mg Transdermal Daily  . sodium chloride flush  3 mL Intravenous Q12H   Continuous Infusions: . sodium chloride 10 mL/hr at 12/20/20 2115  . sodium chloride Stopped (12/21/20 5188)  . vancomycin       LOS: 1 day   Time spent: 40 minutes   Darliss Cheney, MD [facility]  12/21/2020, 12:56 PM   To contact the attending provider between 7A-7P or the covering provider during after hours 7P-7A, please log into the web site [email or web address].

## 2020-12-21 NOTE — Consult Note (Signed)
  Ophthalmology Consult  This is a 29 yo female with PMHx of sepsis and endocarditis who presents with sepsis.  Ophthalmology was consulted due to swelling of left eyelid.  Pt has no visual complaints and states swelling is better today than yesterday.  On exam pt was 20/25 in both eyes.  PERRL, EOMI, no RAPD.  Full ductions and versions.  IOP was 18 in both eyes.  Pt did have multiple outbreaks on face with one on left upper eyelid.  Pt with mild preseptal swelling but could open her eye without discomfort.    L/L/L: mild preseptal swelling of left upper eyelid with one eyelid lesion C/S: white/clear OU K: Clear OU A/C: D/Q OU I: R/R OU L: Clear OU  Dilated exam showed optic nerve to have c/d of 0.4 and normal retinal exam  A/P 1. Mild preseptal swelling:  No intraocular involvement and appears to be improving based on patient.  The rest of the ocular exam was within normal limits.  Pt is on IV antibiotics for sepsis and has excellent coverage for preseptal cellulitis.  If patient continues to improve can see me as outpatient for regular eye exam once discharged.  If swelling worsens or if there are any ocular complaints please re consult me.  Thank you for allowing me to participate in the care of this patient.  Mia [place], M.D. [phone] (cell) [phone] (office)

## 2020-12-21 NOTE — Progress Notes (Signed)
  Echocardiogram 2D Echocardiogram has been performed.  Delcie Roch 12/21/2020, 1:46 PM

## 2020-12-21 NOTE — H&P (Signed)
History and Physical    [name]:[id number] DOB: 1990/12/31 DOA: 12/19/2020  Referring MD/NP/PA: Loreta Ave, MD PCP: Pcp, No  Patient coming from: [facility]  Chief Complaint: Skin infection  I have personally briefly reviewed patient's old medical records in [facility]   HPI: [name] is a 29 y.o. 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  She has had similar symptoms like this previously in the past.  Current episode started about 4 days ago.  Initially rash started out as little pimples that sometimes pop on their own or develop into abscesses.  Denies any significant fever, chills, chest pain, cough, shortness of breath, leg swelling, abdominal pain, or blood in stool.  Last admitted into the hospital in May of this year for septic shock with tricuspid valve endocarditis related to strep pyrogens bacteremia. Patient reports that she only stayed in hospital couple days prior to leaving AGAINST MEDICAL ADVICE and never followed up with any provider. She claims to be clean from IV drugs for 120 days after getting out of jail on August 19.   ED Course: Upon admission into the emergency department patient was seen to be afebrile, pulse elevated up to 143, and all other vital signs maintained.  Labs significant for WBC 12.5, sodium 133, AST 43, ALT 59, lactic acid 1.7, CRP 6.3, sed rate 40.  Influenza and COVID-19 screening were both negative.  Urine drug screen was positive for amphetamines.  Chest x-ray showed no acute abnormalities.  ID had been formally consulted and recommended checking 3 sets of blood cultures starting cefazolin based off previous culture data from May.  Review of Systems  Constitutional: Negative for fever and  malaise/fatigue.  Eyes: Negative for photophobia.       Positive for swelling around her left eye  Respiratory: Negative for cough and shortness of breath.   Cardiovascular: Negative for chest pain and leg swelling.  Gastrointestinal: Negative for vomiting.  Genitourinary: Negative for dysuria and hematuria.  Musculoskeletal: Negative for falls.  Skin: Positive for rash.  Neurological: Negative for focal weakness and loss of consciousness.  Psychiatric/Behavioral: Negative for memory loss and substance abuse.    Past Medical History:  Diagnosis Date  . History of RPR test 07/21/2016   02/2016: 1:1 with negative TPA testing [ ]  f/u 28wks [ ]  f/u admit RPR [ ]  f/u PP RPR  . History of self-harm   . Opioid abuse (HCC)   . Polysubstance abuse [facility])     Past Surgical History:  Procedure Laterality Date  . BUBBLE STUDY  10/30/2019   Procedure: BUBBLE STUDY;  Surgeon: , MD;  Location: [facility] ENDOSCOPY;  Service: Cardiovascular;;  . TEE WITHOUT CARDIOVERSION N/A 10/30/2019   Procedure: TRANSESOPHAGEAL ECHOCARDIOGRAM (TEE);  Surgeon: Pricilla Riffle, MD;  Location[facility] ENDOSCOPY;  Service: Cardiovascular;  Laterality: N/A;  . TONSILLECTOMY       reports that she has been smoking cigarettes. She has been smoking about 0.50 packs per day. She has never used smokeless tobacco. She reports current drug use. Frequency: 5.00 times per week. Drugs: Cocaine, Marijuana, and Methamphetamines. She reports that she does not drink alcohol.  Allergies  Allergen Reactions  . Dextromethorphan Swelling    Facial swelling    Family History  Problem Relation Age of Onset  .  Vision loss Mother   . Depression Mother   . Ulcers Mother   . Gallbladder disease Mother   . Mental illness Mother   . Thyroid disease Mother   . Allergies Father   . Vision loss Father   . Vision loss Maternal Grandmother   . Ulcers Maternal Grandmother   . Gallbladder disease Maternal Grandmother   . Diabetes Maternal  Grandmother   . Vision loss Maternal Grandfather   . Heart disease Maternal Grandfather   . Cancer Maternal Grandfather   . Allergies Paternal Grandmother   . Vision loss Paternal Grandmother   . Gallbladder disease Paternal Grandmother   . Arthritis Paternal Grandmother   . Cancer Paternal Grandmother   . Allergies Paternal Grandfather   . Vision loss Paternal Grandfather     Prior to Admission medications   Medication Sig Start Date End Date Taking? Authorizing Provider  ibuprofen (ADVIL) 200 MG tablet Take 600 mg by mouth every 6 (six) hours as needed for moderate pain.    [provider]  gabapentin (NEURONTIN) 400 MG capsule Take 400 mg by mouth 3 (three) times daily.  10/26/19  [provider]  mirtazapine (REMERON) 30 MG tablet Take 30 mg by mouth at bedtime.  10/26/19  [provider]  risperiDONE (RISPERDAL) 1 MG tablet Take 1 tablet (1 mg total) by mouth at bedtime. Patient not taking: Reported on 09/16/2018 07/13/18 10/26/19  Charm Rings, NP  traZODone (DESYREL) 50 MG tablet Take 1 tablet (50 mg total) by mouth at bedtime as needed for sleep. Patient not taking: Reported on 09/16/2018 07/13/18 10/26/19  Charm Rings, NP    Physical Exam:  Constitutional: Young female who appears to be in some discomfort Vitals:   12/20/20 2145 12/20/20 2200 12/20/20 2230 12/21/20 0020  BP:    128/86  Pulse:    (!) 116  Resp: Temp:    98.7 F (37.1 C)  TempSrc:    Oral  SpO2:    95%  Weight:      Height:       Eyes:Swelling noted around left ENMT: Mucous membranes are moist. Posterior pharynx clear of any exudate or lesions. Neck: Supple. Wound with erythema appreciated on the left side of neck Respiratory: clear to auscultation bilaterally, no wheezing, no crackles. Normal respiratory effort. No accessory muscle use.  Cardiovascular: Tachycardic, no murmurs / rubs / gallops. No extremity edema. 2+ pedal pulses. No carotid bruits.   Abdomen: no tenderness, no masses palpated. No hepatosplenomegaly. Bowel sounds positive.  Musculoskeletal: no clubbing / cyanosis. No joint deformity upper and lower extremities. Good ROM, no contractures. Normal muscle tone.  Skin: Multiple lesions appreciated over the patient's body, but mostly concentrated on the face and neck with surrounding erythema .   Neurologic: CN 2-12 grossly intact. Sensation intact, DTR normal. Strength 5/5 in all 4.  Psychiatric: Normal judgment and insight. Alert and oriented x 3. Normal mood.     Labs on Admission: I have personally reviewed following labs and imaging studies  CBC: Recent Labs  Lab 12/19/20 1647  WBC 12.5*  NEUTROABS 8.4*  HGB 12.7  HCT 38.0  MCV 94.3  PLT 344   Basic Metabolic Panel: Recent Labs  Lab 12/19/20 1647  NA 133*  K 3.9  CL 96*  CO2 26  GLUCOSE 115*  BUN 12  CREATININE 0.90  CALCIUM 8.9   GFR: Estimated Creatinine Clearance: 71.9 mL/min (by C-G formula based on SCr of  0.9 mg/dL). Liver Function Tests: Recent Labs  Lab 12/19/20 1647  AST 43*  ALT 59*  ALKPHOS 67  BILITOT 0.7  PROT 7.5  ALBUMIN 3.9   No results for input(s): LIPASE, AMYLASE in the last 168 hours. No results for input(s): AMMONIA in the last 168 hours. Coagulation Profile: No results for input(s): INR, PROTIME in the last 168 hours. Cardiac Enzymes: No results for input(s): CKTOTAL, CKMB, CKMBINDEX, TROPONINI in the last 168 hours. BNP (last 3 results) No results for input(s): PROBNP in the last 8760 hours. HbA1C: No results for input(s): HGBA1C in the last 72 hours. CBG: No results for input(s): GLUCAP in the last 168 hours. Lipid Profile: No results for input(s): CHOL, HDL, LDLCALC, TRIG, CHOLHDL, LDLDIRECT in the last 72 hours. Thyroid Function Tests: No results for input(s): TSH, T4TOTAL, FREET4, T3FREE, THYROIDAB in the last 72 hours. Anemia Panel: No results for input(s): VITAMINB12, FOLATE, FERRITIN, TIBC, IRON,  RETICCTPCT in the last 72 hours. Urine analysis:    Component Value Date/Time   COLORURINE YELLOW 12/19/2020 1647   APPEARANCEUR HAZY (A) 12/19/2020 1647   LABSPEC 1.020 12/19/2020 1647   PHURINE 7.0 12/19/2020 1647   GLUCOSEU NEGATIVE 12/19/2020 1647   HGBUR NEGATIVE 12/19/2020 1647   BILIRUBINUR NEGATIVE 12/19/2020 1647   BILIRUBINUR neg 09/01/2016 1514   KETONESUR NEGATIVE 12/19/2020 1647   PROTEINUR NEGATIVE 12/19/2020 1647   UROBILINOGEN negative 09/01/2016 1514   UROBILINOGEN 0.2 07/19/2012 1330   NITRITE NEGATIVE 12/19/2020 1647   LEUKOCYTESUR MODERATE (A) 12/19/2020 1647   Sepsis Labs: Recent Results (from the past 240 hour(s))  Blood culture (routine x 2)     Status: None (Preliminary result)   Collection Time: 12/19/20 11:00 PM   Specimen: Left Antecubital; Blood  Result Value Ref Range Status   Specimen Description   Final    LEFT ANTECUBITAL Performed at [facility], [address] [place], [place] [address]    Special Requests   Final    BOTTLES DRAWN AEROBIC AND ANAEROBIC Blood Culture adequate volume Performed at [facility], [address] [place], [place] [address]    Culture   Final    NO GROWTH < 12 HOURS Performed at [facility], [address], [place], [place] [address]    Report Status PENDING  Incomplete  Blood culture (routine x 2)     Status: None (Preliminary result)   Collection Time: 12/19/20 11:09 PM   Specimen: BLOOD RIGHT FOREARM  Result Value Ref Range Status   Specimen Description   Final    BLOOD RIGHT FOREARM Performed at [facility], [address] [place], [place] [address]    Special Requests   Final    BOTTLES DRAWN AEROBIC AND ANAEROBIC Blood Culture results may not be optimal due to an excessive volume of blood received in culture bottles Performed at [facility], [address] [place], [place] [address]    Culture   Final    NO GROWTH < 12 HOURS Performed at [facility], [address] [place], [place] [address]    Report Status PENDING  Incomplete  Resp Panel by RT-PCR (Flu A&B, Covid) Nasopharyngeal Swab     Status: None   Collection Time: 12/19/20 11:54 PM   Specimen: Nasopharyngeal Swab; Nasopharyngeal(NP) swabs in vial transport medium  Result Value Ref Range Status   SARS Coronavirus 2 by RT PCR NEGATIVE NEGATIVE Final  Comment: (NOTE) SARS-CoV-2 target nucleic acids are NOT DETECTED.  The SARS-CoV-2 RNA is generally detectable in upper respiratory specimens during the acute phase of infection. The lowest concentration of SARS-CoV-2 viral copies this assay can detect is 138 copies/mL. A negative result does not preclude SARS-Cov-2 infection and should not be used as the sole basis for treatment or other patient management decisions. A negative result may occur with  improper specimen collection/handling, submission of specimen other than nasopharyngeal swab, presence of viral mutation(s) within the areas targeted by this assay, and inadequate number of viral copies(<138 copies/mL). A negative result must be combined with clinical observations, patient history, and epidemiological information. The expected result is Negative.  Fact Sheet for Patients:  BloggerCourse.com  Fact Sheet for Healthcare Providers:  SeriousBroker.it  This test is no t yet approved or cleared by the [place] FDA and  has been authorized for detection and/or diagnosis of SARS-CoV-2 by FDA under an Emergency Use Authorization (EUA). This EUA will remain  in effect (meaning this test can be used) for the duration of the COVID-19 declaration under Section 564(b)(1) of the Act, 21 U.S.C.section 360bbb-3(b)(1), unless the authorization is terminated  or revoked sooner.       Influenza A by PCR NEGATIVE NEGATIVE Final   Influenza B by PCR NEGATIVE NEGATIVE Final    Comment: (NOTE) The Xpert Xpress  SARS-CoV-2/FLU/RSV plus assay is intended as an aid in the diagnosis of influenza from Nasopharyngeal swab specimens and should not be used as a sole basis for treatment. Nasal washings and aspirates are unacceptable for Xpert Xpress SARS-CoV-2/FLU/RSV testing.  Fact Sheet for Patients: BloggerCourse.com  Fact Sheet for Healthcare Providers: SeriousBroker.it  This test is not yet approved or cleared by the [place] FDA and has been authorized for detection and/or diagnosis of SARS-CoV-2 by FDA under an Emergency Use Authorization (EUA). This EUA will remain in effect (meaning this test can be used) for the duration of the COVID-19 declaration under Section 564(b)(1) of the Act, 21 U.S.C. section 360bbb-3(b)(1), unless the authorization is terminated or revoked.  Performed at [facility], [address] [place], [place] [address]   Culture, blood (single)     Status: None (Preliminary result)   Collection Time: 12/20/20  4:53 AM   Specimen: Left Antecubital; Blood  Result Value Ref Range Status   Specimen Description   Final    LEFT ANTECUBITAL Performed at [facility], [address] [place], [place] [address]    Special Requests   Final    BOTTLES DRAWN AEROBIC AND ANAEROBIC Blood Culture adequate volume Performed at [facility], [address] [place], [place] [address]    Culture  Setup Time   Final    ANAEROBIC BOTTLE ONLY GRAM POSITIVE COCCI Organism ID to follow Performed at [facility], [address], [place], [place] [address]    Culture PENDING  Incomplete   Report Status PENDING  Incomplete  MRSA PCR Screening     Status: Abnormal   Collection Time: 12/20/20  6:15 PM   Specimen: Nasal Mucosa; Nasopharyngeal  Result Value Ref Range Status   MRSA by PCR POSITIVE (A) NEGATIVE Final    Comment:        The GeneXpert MRSA Assay (FDA approved for NASAL  specimens only), is one component of a comprehensive MRSA colonization surveillance program. It is not intended to diagnose MRSA infection nor to guide or monitor treatment for  MRSA infections. RESULT CALLED TO, READ BACK BY AND VERIFIED WITH: RN T. TIE [id number] FCP Performed at [facility], [address] [place], [place] [address]      Radiological Exams on Admission: DG Chest 2 View  Result Date: 12/19/2020 CLINICAL DATA:  29 year old female with tachycardia. EXAM: CHEST - 2 VIEW COMPARISON:  Chest radiograph dated 05/12/2020. FINDINGS: The heart size and mediastinal contours are within normal limits. Both lungs are clear. The visualized skeletal structures are unremarkable. IMPRESSION: No active cardiopulmonary disease. Electronically Signed   By: Elgie CollardArash  Radparvar M.D.   On: 12/19/2020 22:12    EKG: Independently reviewed. Sinus tachycardia at 120 bpm  Assessment/Plan Sepsis secondary to endocarditis: On admission patient noted to be tachycardic with white blood cell count elevated up to 12.5 meeting SIRS criteria. Previously positive for group A strep (strep pyogenes) when admitted back in May with tricuspid valve endocarditis, but left AMA prior to receiving full recommended treatment. CRP was elevated at 6.3 and sedimentation rate was 40. ID had been consulted and recommended checking 3 sets of blood cultures and treating for previous infection.  -Admit to a medical telemetry bed -Follow-up blood cultures -Continue empiric antibiotics of Ancef -Check echocardiogram -Recheck CBC in a.m.  -May need to recontact ID in a.m. if not seen  Body sores/abscesses with surrounding cellulitis: Acute. Patient presents with complaints of multiple skin ulcerations mostly concentrated on the face and neck. Some wounds appear to have abscesses present. Suspect related with methamphetamine use. -Continue antibiotics as seen above -Wound care consult  -May warrant surgery consult  for I&D if no response to antibiotics  Abnormal urinalysis: Acute. Urinalysis noted moderate leukocytes, few bacteria, 6-10 squamous epithelial cells, and 11-20 WBCs. Would suspect this could be likely contaminant. -Follow-up urine culture  Hyponatremia: Acute chronic. Sodium 133 on admission. -Normal saline IV fluids at 75 mL/h  Elevated transaminases  chronic hepatitis C: Acute. On admission AST 43 and ALT 59. -Check acetaminophen and salicylate levels -Check acute hepatitis panel.  Polysubstance abuse: Patient reports being clean from IV drug use for the last 20 days. Urine drug screen was positive for amphetamines. Prior history of abuse also includes heroin, cocaine, and marijuana. -Continue to monitor for withdrawal symptoms.  Ativan IV if needed -Continue counseling on need of cessation of drug use  DVT prophylaxis:lovenox  Code Status: full  Family Communication: Mother requested be updated at this time Disposition Plan: To be determined Consults called: Admission status: Inpatient, require more than 2 midnight stay for IV antibiotic  Clydie Braunondell A Sarah-Jane Nazario MD [facility]   If 7PM-7AM, please contact night-coverage   12/21/2020, 1:33 AM

## 2020-12-21 NOTE — Progress Notes (Signed)
Pharmacy Antibiotic Note  [name] is a 29 y.o. female admitted on 12/19/2020 with bacteremia.  Pharmacy has been consulted for Vancomycin dosing. Pt with MRSA and MRSE growing in blood cultures.  Plan: D/c Ancef Vancomycin 1gm IV now then 750mg  IV q12h Will f/u renal function, micro data, and pt's clinical condition Vanc levels prn   Height: 5' (152.4 cm) Weight: 56.2 kg (124 lb) IBW/kg (Calculated) : 45.5  Temp (24hrs), Avg:98.5 F (36.9 C), Min:98.3 F (36.8 C), Max:98.7 F (37.1 C)  Recent Labs  Lab 12/19/20 1647 12/21/20 0104  WBC 12.5* 10.3  CREATININE 0.90  --   LATICACIDVEN 1.7  --     Estimated Creatinine Clearance: 71.9 mL/min (by C-G formula based on SCr of 0.9 mg/dL).    Allergies  Allergen Reactions  . Dextromethorphan Swelling    Facial swelling    Antimicrobials this admission: 12/24 Ancef >> 12/25 12/25 Vanc >>    Microbiology results: 12/24 BCx: 1/6 MRSA and MRSE 12/24 MRSA PCR: positive  Name of physician (or Provider) Contacted: Zierle-Ghosh   Results for orders placed or performed during the hospital encounter of 12/19/20  Blood Culture ID Panel (Reflexed) (Collected: 12/20/2020  4:53 AM)  Result Value Ref Range   Enterococcus faecalis NOT DETECTED NOT DETECTED   Enterococcus Faecium NOT DETECTED NOT DETECTED   Listeria monocytogenes NOT DETECTED NOT DETECTED   Staphylococcus species DETECTED (A) NOT DETECTED   Staphylococcus aureus (BCID) DETECTED (A) NOT DETECTED   Staphylococcus epidermidis DETECTED (A) NOT DETECTED   Staphylococcus lugdunensis NOT DETECTED NOT DETECTED   Streptococcus species NOT DETECTED NOT DETECTED   Streptococcus agalactiae NOT DETECTED NOT DETECTED   Streptococcus pneumoniae NOT DETECTED NOT DETECTED   Streptococcus pyogenes NOT DETECTED NOT DETECTED   A.calcoaceticus-baumannii NOT DETECTED NOT DETECTED   Bacteroides fragilis NOT DETECTED NOT DETECTED   Enterobacterales NOT DETECTED NOT DETECTED    Enterobacter cloacae complex NOT DETECTED NOT DETECTED   Escherichia coli NOT DETECTED NOT DETECTED   Klebsiella aerogenes NOT DETECTED NOT DETECTED   Klebsiella oxytoca NOT DETECTED NOT DETECTED   Klebsiella pneumoniae NOT DETECTED NOT DETECTED   Proteus species NOT DETECTED NOT DETECTED   Salmonella species NOT DETECTED NOT DETECTED   Serratia marcescens NOT DETECTED NOT DETECTED   Haemophilus influenzae NOT DETECTED NOT DETECTED   Neisseria meningitidis NOT DETECTED NOT DETECTED   Pseudomonas aeruginosa NOT DETECTED NOT DETECTED   Stenotrophomonas maltophilia NOT DETECTED NOT DETECTED   Candida albicans NOT DETECTED NOT DETECTED   Candida auris NOT DETECTED NOT DETECTED   Candida glabrata NOT DETECTED NOT DETECTED   Candida krusei NOT DETECTED NOT DETECTED   Candida parapsilosis NOT DETECTED NOT DETECTED   Candida tropicalis NOT DETECTED NOT DETECTED   Cryptococcus neoformans/gattii NOT DETECTED NOT DETECTED   Methicillin resistance mecA/C DETECTED (A) NOT DETECTED   Meth resistant mecA/C and MREJ DETECTED (A) NOT DETECTED    12/22/2020, PharmD, BCPS Please see amion for complete clinical pharmacist phone list 12/21/2020  2:43 AM

## 2020-12-22 DIAGNOSIS — I38 Endocarditis, valve unspecified: Secondary | ICD-10-CM

## 2020-12-22 DIAGNOSIS — B182 Chronic viral hepatitis C: Secondary | ICD-10-CM

## 2020-12-22 DIAGNOSIS — O98419 Viral hepatitis complicating pregnancy, unspecified trimester: Secondary | ICD-10-CM

## 2020-12-22 LAB — CBC WITH DIFFERENTIAL/PLATELET
Abs Immature Granulocytes: 0.02 10*3/uL (ref 0.00–0.07)
Basophils Absolute: 0 10*3/uL (ref 0.0–0.1)
Basophils Relative: 1 %
Eosinophils Absolute: 0.2 10*3/uL (ref 0.0–0.5)
Eosinophils Relative: 3 %
HCT: 32.9 % — ABNORMAL LOW (ref 36.0–46.0)
Hemoglobin: 10.7 g/dL — ABNORMAL LOW (ref 12.0–15.0)
Immature Granulocytes: 0 %
Lymphocytes Relative: 45 %
Lymphs Abs: 2.8 10*3/uL (ref 0.7–4.0)
MCH: 30.7 pg (ref 26.0–34.0)
MCHC: 32.5 g/dL (ref 30.0–36.0)
MCV: 94.5 fL (ref 80.0–100.0)
Monocytes Absolute: 0.7 10*3/uL (ref 0.1–1.0)
Monocytes Relative: 12 %
Neutro Abs: 2.4 10*3/uL (ref 1.7–7.7)
Neutrophils Relative %: 39 %
Platelets: 301 10*3/uL (ref 150–400)
RBC: 3.48 MIL/uL — ABNORMAL LOW (ref 3.87–5.11)
RDW: 12.7 % (ref 11.5–15.5)
WBC: 6.2 10*3/uL (ref 4.0–10.5)
nRBC: 0 % (ref 0.0–0.2)

## 2020-12-22 LAB — COMPREHENSIVE METABOLIC PANEL
ALT: 43 U/L (ref 0–44)
AST: 37 U/L (ref 15–41)
Albumin: 3 g/dL — ABNORMAL LOW (ref 3.5–5.0)
Alkaline Phosphatase: 53 U/L (ref 38–126)
Anion gap: 11 (ref 5–15)
BUN: 10 mg/dL (ref 6–20)
CO2: 20 mmol/L — ABNORMAL LOW (ref 22–32)
Calcium: 8.4 mg/dL — ABNORMAL LOW (ref 8.9–10.3)
Chloride: 104 mmol/L (ref 98–111)
Creatinine, Ser: 0.77 mg/dL (ref 0.44–1.00)
GFR, Estimated: >60 mL/min (ref >60)
Glucose, Bld: 98 mg/dL (ref 70–99)
Potassium: 4.2 mmol/L (ref 3.5–5.1)
Sodium: 135 mmol/L (ref 135–145)
Total Bilirubin: 0.4 mg/dL (ref 0.3–1.2)
Total Protein: 6.1 g/dL — ABNORMAL LOW (ref 6.5–8.1)

## 2020-12-22 LAB — HIV ANTIBODY (ROUTINE TESTING W REFLEX): HIV Screen 4th Generation wRfx: NONREACTIVE

## 2020-12-22 LAB — RPR: RPR Ser Ql: NONREACTIVE

## 2020-12-22 NOTE — Progress Notes (Signed)
PROGRESS NOTE    [name]  TIR:[id number] DOB: 1991-07-03 DOA: 12/19/2020 PCP: Pcp, No   Brief Narrative:  HPI: [name] is a 29 y.o. 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  She has had similar symptoms like this previously in the past.  Current episode started about 4 days ago.  Initially rash started out as little pimples that sometimes pop on their own or develop into abscesses.  Denies any significant fever, chills, chest pain, cough, shortness of breath, leg swelling, abdominal pain, or blood in stool.  Last admitted into the hospital in May of this year for septic shock with tricuspid valve endocarditis related to strep pyrogens bacteremia. Patient reports that she only stayed in hospital couple days prior to leaving [place] and never followed up with any provider. She claims to be clean from IV drugs for 120 days after getting out of jail on August 19.   ED Course: Upon admission into the emergency department patient was seen to be afebrile, pulse elevated up to 143, and all other vital signs maintained.  Labs significant for WBC 12.5, sodium 133, AST 43, ALT 59, lactic acid 1.7, CRP 6.3, sed rate 40.  Influenza and COVID-19 screening were both negative.  Urine drug screen was positive for amphetamines.  Chest x-ray showed no acute abnormalities.  ID had been formally consulted and recommended checking 3 sets of blood cultures starting cefazolin based off previous culture data from May.  Assessment & Plan:   Principal Problem:   Sepsis ([place]) Active Problems:   Hepatitis C, chronic, maternal, antepartum ([place])   Cigarette smoker   Polysubstance dependence including opioid type drug without complication, episodic abuse ([place])    Endocarditis   Rash   Sepsis secondary to possible endocarditis: Patient met sepsis criteria due to tachycardic with white blood cell count elevated up to 12.5 meeting SIRS criteria. Previously positive for group A strep (strep pyogenes) when admitted back in May with tricuspid valve endocarditis, but left AMA prior to receiving full recommended treatment. CRP was elevated at 6.3 and sedimentation rate was 40. ID is on board.  Her repeat transthoracic echo does not show any endocarditis however TEE will be better study if ID recommends.  Blood culture growing MRSA and staph epidermidis.  Repeat blood culture from today.  On vancomycin and management per ID.  Unknown skin lesions, possibly carbuncles:. Patient presents with complaints of multiple skin ulcerations mostly concentrated on the face and neck.  Seem to be carbuncles.  ID pursuing further work-up for syphilis and others.  Asymptomatic bacteriuria: Does not need antibiotics for this.  Hyponatremia: Resolved.  Elevated transaminases  history of chronic hepatitis C: Acute.  LFTs are stable.  Polysubstance abuse: Patient reports being clean from IV drug use for the last 120 days. Urine drug screen was positive for amphetamines however she tells me that she takes Adderall. Prior history of abuse also includes heroin, cocaine, and marijuana. -Continue to monitor for withdrawal symptoms.  Ativan IV if needed -Continue counseling on need of cessation of drug use  Mild left preseptal cellulitis: ID was concerned about possible osteomyelitis.  Patient has no visual symptoms or deep pain in the left eye.  Per ID recommendation, ophthalmology was consulted and she was ruled out of the mellitus and they opined that she  likely has preseptal cellulitis for which she is already on antibiotics.  Patient states that she feels better as far as the swelling around her left eye goes.  Mild AKI: Secondary to sepsis.  Resolved.  Stop IV fluids.  DVT  prophylaxis: enoxaparin (LOVENOX) injection 40 mg Start: 12/20/20 1700   Code Status: Full Code  Family Communication: None present at bedside.  Plan of care discussed with patient in length.  Status is: Inpatient  Remains inpatient appropriate because:Inpatient level of care appropriate due to severity of illness   Dispo: The patient is from: Home              Anticipated d/c is to: Home              Anticipated d/c date is: > 3 days              Patient currently is not medically stable to d/c.        Estimated body mass index is 24.22 kg/m as calculated from the following:   Height as of this encounter: 5' (1.524 m).   Weight as of this encounter: 56.2 kg.      Nutritional status:               Consultants:   ID  Ophthalmology  Procedures:   None  Antimicrobials:  Anti-infectives (From admission, onward)   Start     Dose/Rate Route Frequency Ordered Stop   12/21/20 1600  vancomycin (VANCOREADY) IVPB 750 mg/150 mL        750 mg 150 mL/hr over 60 Minutes Intravenous Every 12 hours 12/21/20 0249     12/21/20 0300  vancomycin (VANCOCIN) IVPB 1000 mg/200 mL premix        1,000 mg 200 mL/hr over 60 Minutes Intravenous STAT 12/21/20 0249 12/21/20 0711   12/20/20 0600  ceFAZolin (ANCEF) IVPB 1 g/50 mL premix  Status:  Discontinued        1 g 100 mL/hr over 30 Minutes Intravenous Every 8 hours 12/19/20 2254 12/21/20 0242         Subjective: Seen and examined.  She states that she is feeling better.  She is able to open her left eye more.  No problem with vision  Objective: Vitals:   12/21/20 2100 12/22/20 0100 12/22/20 0430 12/22/20 0914  BP: 122/82 126/81 130/88 122/88  Pulse: (!) 104 98 99 94  Resp: _0 Temp: 98.3 F (36.8 C) 98.1 F (36.7 C) 98.2 F (36.8 C) 97.8 F (36.6 C)  TempSrc: Oral Oral Oral Oral  SpO2: 95% 99% 98% 100%  Weight:      Height:        Intake/Output Summary (Last 24 hours) at 12/22/2020 1048 Last data  filed at 12/22/2020 0914 Gross per 24 hour  Intake 240 ml  Output --  Net 240 ml   Filed Weights   12/19/20 1634  Weight: 56.2 kg    Examination:  General exam: Appears calm and comfortable  Respiratory system: Clear to auscultation. Respiratory effort normal. Cardiovascular system: S1 & S2 heard, RRR. No JVD, rubs, gallops or clicks. No pedal edema. Gastrointestinal system: Abdomen is nondistended, soft and nontender. No organomegaly or masses felt. Normal bowel sounds heard. Central nervous system: Alert and oriented. No focal neurological deficits. Extremities: Symmetric 5 x 5 power. Skin: Multiple carbuncles sparingly affecting her limbs but more concentrated on the face. Psychiatry: Judgement and insight appear normal. Mood & affect appropriate.   Data  Reviewed: I have personally reviewed following labs and imaging studies  CBC: Recent Labs  Lab 12/19/20 1647 12/21/20 0104 12/22/20 0640  WBC 12.5* 10.3 6.2  NEUTROABS 8.4*  --  2.4  HGB 12.7 11.6* 10.7*  HCT 38.0 35.0* 32.9*  MCV 94.3 94.9 94.5  PLT 344 315 093   Basic Metabolic Panel: Recent Labs  Lab 12/19/20 1647 12/21/20 0104 12/22/20 0640  NA 133* 135 135  K 3.9 4.3 4.2  CL 96* 98 104  CO2 26 25 20*  GLUCOSE 115* 93 98  BUN _0 CREATININE 0.90 1.06* 0.77  CALCIUM 8.9 8.8* 8.4*   GFR: Estimated Creatinine Clearance: 80.8 mL/min (by C-G formula based on SCr of 0.77 mg/dL). Liver Function Tests: Recent Labs  Lab 12/19/20 1647 12/21/20 0104 12/22/20 0640  AST 43* 48* 37  ALT 59* 47* 43  ALKPHOS 67 61 53  BILITOT 0.7 1.0 0.4  PROT 7.5 6.6 6.1*  ALBUMIN 3.9 3.3* 3.0*   No results for input(s): LIPASE, AMYLASE in the last 168 hours. No results for input(s): AMMONIA in the last 168 hours. Coagulation Profile: No results for input(s): INR, PROTIME in the last 168 hours. Cardiac Enzymes: No results for input(s): CKTOTAL, CKMB, CKMBINDEX, TROPONINI in the last 168 hours. BNP (last 3  results) No results for input(s): PROBNP in the last 8760 hours. HbA1C: No results for input(s): HGBA1C in the last 72 hours. CBG: No results for input(s): GLUCAP in the last 168 hours. Lipid Profile: No results for input(s): CHOL, HDL, LDLCALC, TRIG, CHOLHDL, LDLDIRECT in the last 72 hours. Thyroid Function Tests: No results for input(s): TSH, T4TOTAL, FREET4, T3FREE, THYROIDAB in the last 72 hours. Anemia Panel: No results for input(s): VITAMINB12, FOLATE, FERRITIN, TIBC, IRON, RETICCTPCT in the last 72 hours. Sepsis Labs: Recent Labs  Lab 12/19/20 1647  LATICACIDVEN 1.7    Recent Results (from the past 240 hour(s))  Blood culture (routine x 2)     Status: None (Preliminary result)   Collection Time: 12/19/20 11:00 PM   Specimen: Left Antecubital; Blood  Result Value Ref Range Status   Specimen Description   Final    LEFT ANTECUBITAL Performed at [facility], [address] [place], [place] [address]    Special Requests   Final    BOTTLES DRAWN AEROBIC AND ANAEROBIC Blood Culture adequate volume Performed at [facility], [address], [place], [place] [address]    Culture   Final    NO GROWTH 1 DAY Performed at [facility], [place] [address] [place], [place] [address]    Report Status PENDING  Incomplete  Blood culture (routine x 2)     Status: Abnormal (Preliminary result)   Collection Time: 12/19/20 11:09 PM   Specimen: BLOOD RIGHT FOREARM  Result Value Ref Range Status   Specimen Description   Final    BLOOD RIGHT FOREARM Performed at [facility], [address] [place], [place] [address]    Special Requests   Final    BOTTLES DRAWN AEROBIC AND ANAEROBIC Blood Culture results may not be optimal due to an excessive volume of blood received in culture bottles Performed at [facility], [address] [place], [place] [address]    Culture  Setup Time   Final    GRAM POSITIVE COCCI AEROBIC BOTTLE  ONLY CRITICAL VALUE NOTED.  VALUE IS CONSISTENT WITH PREVIOUSLY REPORTED AND CALLED VALUE. Performed at  [facility], [place] [address], [place], [place] [address]    Culture STAPHYLOCOCCUS AUREUS (A)  Final   Report Status PENDING  Incomplete  Resp Panel by RT-PCR (Flu A&B, Covid) Nasopharyngeal Swab     Status: None   Collection Time: 12/19/20 11:54 PM   Specimen: Nasopharyngeal Swab; Nasopharyngeal(NP) swabs in vial transport medium  Result Value Ref Range Status   SARS Coronavirus 2 by RT PCR NEGATIVE NEGATIVE Final    Comment: (NOTE) SARS-CoV-2 target nucleic acids are NOT DETECTED.  The SARS-CoV-2 RNA is generally detectable in upper respiratory specimens during the acute phase of infection. The lowest concentration of SARS-CoV-2 viral copies this assay can detect is 138 copies/mL. A negative result does not preclude SARS-Cov-2 infection and should not be used as the sole basis for treatment or other patient management decisions. A negative result may occur with  improper specimen collection/handling, submission of specimen other than nasopharyngeal swab, presence of viral mutation(s) within the areas targeted by this assay, and inadequate number of viral copies(<138 copies/mL). A negative result must be combined with clinical observations, patient history, and epidemiological information. The expected result is Negative.  Fact Sheet for Patients:  EntrepreneurPulse.com.au  Fact Sheet for Healthcare Providers:  IncredibleEmployment.be  This test is no t yet approved or cleared by the [place] FDA and  has been authorized for detection and/or diagnosis of SARS-CoV-2 by FDA under an Emergency Use Authorization (EUA). This EUA will remain  in effect (meaning this test can be used) for the duration of the COVID-19 declaration under Section 564(b)(1) of the Act, 21 U.S.C.section 360bbb-3(b)(1), unless the authorization is terminated   or revoked sooner.       Influenza A by PCR NEGATIVE NEGATIVE Final   Influenza B by PCR NEGATIVE NEGATIVE Final    Comment: (NOTE) The Xpert Xpress SARS-CoV-2/FLU/RSV plus assay is intended as an aid in the diagnosis of influenza from Nasopharyngeal swab specimens and should not be used as a sole basis for treatment. Nasal washings and aspirates are unacceptable for Xpert Xpress SARS-CoV-2/FLU/RSV testing.  Fact Sheet for Patients: EntrepreneurPulse.com.au  Fact Sheet for Healthcare Providers: IncredibleEmployment.be  This test is not yet approved or cleared by the [place] FDA and has been authorized for detection and/or diagnosis of SARS-CoV-2 by FDA under an Emergency Use Authorization (EUA). This EUA will remain in effect (meaning this test can be used) for the duration of the COVID-19 declaration under Section 564(b)(1) of the Act, 21 U.S.C. section 360bbb-3(b)(1), unless the authorization is terminated or revoked.  Performed at [facility], [address] [place], [place] [address]   Culture, blood (single)     Status: Abnormal (Preliminary result)   Collection Time: 12/20/20  4:53 AM   Specimen: Left Antecubital; Blood  Result Value Ref Range Status   Specimen Description   Final    LEFT ANTECUBITAL Performed at [facility], [address] [place], [place] [address]    Special Requests   Final    BOTTLES DRAWN AEROBIC AND ANAEROBIC Blood Culture adequate volume Performed at [facility], [address] [place], [place] [address]    Culture  Setup Time   Final    IN BOTH AEROBIC AND ANAEROBIC BOTTLES GRAM POSITIVE COCCI Organism ID to follow CRITICAL RESULT CALLED TO, READ BACK BY AND VERIFIED WITH: C AMEND PHARMD 12/21/20 0228 JDW    Culture (A)  Final    STAPHYLOCOCCUS AUREUS SUSCEPTIBILITIES  TO FOLLOW Performed at [facility], [place] [address] [place], [place] [address]     Report Status PENDING  Incomplete  Blood Culture ID Panel (Reflexed)     Status: Abnormal   Collection Time: 12/20/20  4:53 AM  Result Value Ref Range Status   Enterococcus faecalis NOT DETECTED NOT DETECTED Final   Enterococcus Faecium NOT DETECTED NOT DETECTED Final   Listeria monocytogenes NOT DETECTED NOT DETECTED Final   Staphylococcus species DETECTED (A) NOT DETECTED Final    Comment: CRITICAL RESULT CALLED TO, READ BACK BY AND VERIFIED WITH: C AMEND PHARMD 12/21/20 0228 JDW    Staphylococcus aureus (BCID) DETECTED (A) NOT DETECTED Final    Comment: Methicillin (oxacillin)-resistant Staphylococcus aureus (MRSA). MRSA is predictably resistant to beta-lactam antibiotics (except ceftaroline). Preferred therapy is vancomycin unless clinically contraindicated. Patient requires contact precautions if  hospitalized. CRITICAL RESULT CALLED TO, READ BACK BY AND VERIFIED WITH: C AMEND PHARMD 12/21/20 0228 JDW    Staphylococcus epidermidis DETECTED (A) NOT DETECTED Final    Comment: CRITICAL RESULT CALLED TO, READ BACK BY AND VERIFIED WITH: C AMEND PHARMD 12/21/20 0228 JDW    Staphylococcus lugdunensis NOT DETECTED NOT DETECTED Final   Streptococcus species NOT DETECTED NOT DETECTED Final   Streptococcus agalactiae NOT DETECTED NOT DETECTED Final   Streptococcus pneumoniae NOT DETECTED NOT DETECTED Final   Streptococcus pyogenes NOT DETECTED NOT DETECTED Final   A.calcoaceticus-baumannii NOT DETECTED NOT DETECTED Final   Bacteroides fragilis NOT DETECTED NOT DETECTED Final   Enterobacterales NOT DETECTED NOT DETECTED Final   Enterobacter cloacae complex NOT DETECTED NOT DETECTED Final   Escherichia coli NOT DETECTED NOT DETECTED Final   Klebsiella aerogenes NOT DETECTED NOT DETECTED Final   Klebsiella oxytoca NOT DETECTED NOT DETECTED Final   Klebsiella pneumoniae NOT DETECTED NOT DETECTED Final   Proteus species NOT DETECTED NOT DETECTED Final   Salmonella species NOT DETECTED NOT  DETECTED Final   Serratia marcescens NOT DETECTED NOT DETECTED Final   Haemophilus influenzae NOT DETECTED NOT DETECTED Final   Neisseria meningitidis NOT DETECTED NOT DETECTED Final   Pseudomonas aeruginosa NOT DETECTED NOT DETECTED Final   Stenotrophomonas maltophilia NOT DETECTED NOT DETECTED Final   Candida albicans NOT DETECTED NOT DETECTED Final   Candida auris NOT DETECTED NOT DETECTED Final   Candida glabrata NOT DETECTED NOT DETECTED Final   Candida krusei NOT DETECTED NOT DETECTED Final   Candida parapsilosis NOT DETECTED NOT DETECTED Final   Candida tropicalis NOT DETECTED NOT DETECTED Final   Cryptococcus neoformans/gattii NOT DETECTED NOT DETECTED Final   Methicillin resistance mecA/C DETECTED (A) NOT DETECTED Final    Comment: CRITICAL RESULT CALLED TO, READ BACK BY AND VERIFIED WITH: C AMEND PHARMD 12/21/20 0228 JDW    Meth resistant mecA/C and MREJ DETECTED (A) NOT DETECTED Final    Comment: CRITICAL RESULT CALLED TO, READ BACK BY AND VERIFIED WITH: C AMEND PHARMD 12/21/20 0228 JDW Performed at [facility], [address], [place],  [address]   MRSA PCR Screening     Status: Abnormal   Collection Time: 12/20/20  6:15 PM   Specimen: Nasal Mucosa; Nasopharyngeal  Result Value Ref Range Status   MRSA by PCR POSITIVE (A) NEGATIVE Final    Comment:        The GeneXpert MRSA Assay (FDA approved for NASAL specimens only), is one component of a comprehensive MRSA colonization surveillance program. It is not intended to diagnose MRSA infection nor to guide or monitor treatment  for MRSA infections. RESULT CALLED TO, READ BACK BY AND VERIFIED WITH: RN T. TIE [id number] FCP Performed at [facility] [address], [place], [place] [address]       Radiology Studies: ECHOCARDIOGRAM COMPLETE  Result Date: 12/21/2020    ECHOCARDIOGRAM REPORT   Patient Name:   TAHLIA DEAMER [facility] Date of Exam: 12/21/2020 Medical Rec #:  [id number]       Height:        60.0 in Accession #:    [id number]      Weight:       124.0 lb Date of Birth:  October 23, 1991       BSA:          1.523 m Patient Age:    29 years        BP:           117/79 mmHg Patient Gender: F               HR:           92 bpm. Exam Location:  Inpatient Procedure: 2D Echo Indications:    endocarditis  History:        Patient has prior history of Echocardiogram examinations, most                 recent 05/12/2020. Sepsis. Hepatitis C.; Risk Factors:Current                 Smoker and poly substance abuse.  Sonographer:    Johny Chess Referring Phys: [id number] RONDELL A SMITH IMPRESSIONS  1. Left ventricular ejection fraction, by estimation, is 60 to 65%. The left ventricle has normal function. The left ventricle has no regional wall motion abnormalities. Left ventricular diastolic parameters were normal.  2. Right ventricular systolic function is normal. The right ventricular size is normal. There is normal pulmonary artery systolic pressure. The estimated right ventricular systolic pressure is 73.4 mmHg.  3. The mitral valve is grossly normal. Trivial mitral valve regurgitation. No evidence of mitral stenosis.  4. The aortic valve is tricuspid. Aortic valve regurgitation is not visualized. No aortic stenosis is present.  5. The inferior vena cava is normal in size with greater than 50% respiratory variability, suggesting right atrial pressure of 3 mmHg. Conclusion(s)/Recommendation(s): Normal biventricular function without evidence of hemodynamically significant valvular heart disease. No evidence of valvular vegetations on this transthoracic echocardiogram. Would recommend a transesophageal echocardiogram to exclude infective endocarditis if clinically indicated. FINDINGS  Left Ventricle: Left ventricular ejection fraction, by estimation, is 60 to 65%. The left ventricle has normal function. The left ventricle has no regional wall motion abnormalities. The left ventricular internal cavity size was normal in  size. There is  no left ventricular hypertrophy. Left ventricular diastolic parameters were normal. Right Ventricle: The right ventricular size is normal. No increase in right ventricular wall thickness. Right ventricular systolic function is normal. There is normal pulmonary artery systolic pressure. The tricuspid regurgitant velocity is 2.74 m/s, and  with an assumed right atrial pressure of 3 mmHg, the estimated right ventricular systolic pressure is 19.3 mmHg. Left Atrium: Left atrial size was normal in size. Right Atrium: Right atrial size was normal in size. Pericardium: Trivial pericardial effusion is present. Mitral Valve: The mitral valve is grossly normal. Trivial mitral valve regurgitation. No evidence of mitral valve stenosis. Tricuspid Valve: The tricuspid valve is grossly normal. Tricuspid valve regurgitation is mild . No evidence of tricuspid stenosis. Aortic Valve: The aortic valve is  tricuspid. Aortic valve regurgitation is not visualized. No aortic stenosis is present. Pulmonic Valve: The pulmonic valve was grossly normal. Pulmonic valve regurgitation is not visualized. No evidence of pulmonic stenosis. Aorta: The aortic root and ascending aorta are structurally normal, with no evidence of dilitation. Venous: The inferior vena cava is normal in size with greater than 50% respiratory variability, suggesting right atrial pressure of 3 mmHg. IAS/Shunts: The atrial septum is grossly normal.  LEFT VENTRICLE PLAX 2D LVIDd:         3.90 cm  Diastology LVIDs:         2.40 cm  LV e' medial:    10.10 cm/s LV PW:         0.90 cm  LV E/e' medial:  9.3 LV IVS:        0.80 cm  LV e' lateral:   18.40 cm/s LVOT diam:     1.80 cm  LV E/e' lateral: 5.1 LV SV:         49 LV SV Index:   32 LVOT Area:     2.54 cm  RIGHT VENTRICLE RV S prime:     17.70 cm/s TAPSE (M-mode): 2.0 cm LEFT ATRIUM             Index       RIGHT ATRIUM           Index LA diam:        3.70 cm 2.43 cm/m  RA Area:     10.20 cm LA Vol (A2C):    27.6 ml 18.15 ml/m RA Volume:   21.20 ml  13.92 ml/m LA Vol (A4C):   29.3 ml 19.23 ml/m LA Biplane Vol: 29.8 ml 19.56 ml/m  AORTIC VALVE LVOT Vmax:   110.00 cm/s LVOT Vmean:  73.500 cm/s LVOT VTI:    0.193 m  AORTA Ao Root diam: 2.40 cm Ao Asc diam:  2.30 cm MITRAL VALVE               TRICUSPID VALVE MV Area (PHT): 3.85 cm    TR Peak grad:   30.0 mmHg MV Decel Time: 197 msec    TR Vmax:        274.00 cm/s MV E velocity: 94.10 cm/s MV A velocity: 61.80 cm/s  SHUNTS MV E/A ratio:  1.52        Systemic VTI:  0.19 m                            Systemic Diam: 1.80 cm Eleonore Chiquito MD Electronically signed by Eleonore Chiquito MD Signature Date/Time: 12/21/2020/3:57:08 PM    Final    CT ORBITS W CONTRAST  Result Date: 12/21/2020 CLINICAL DATA:  Left proptosis.  Bacteremia. EXAM: CT ORBITS WITH CONTRAST TECHNIQUE: Multidetector CT images was performed according to the standard protocol following intravenous contrast administration. CONTRAST:  32m OMNIPAQUE IOHEXOL 300 MG/ML  SOLN COMPARISON:  Head CT 05/21/2006 FINDINGS: Orbits: Mild-to-moderate left orbital preseptal soft tissue swelling. No fluid collection or postseptal inflammation. No fracture or destructive osseous process. Visualized sinuses: Paranasal sinuses and mastoid air cells are clear. Soft tissues: No additional findings. Limited intracranial: Unremarkable. IMPRESSION: Left orbital preseptal soft tissue swelling compatible with cellulitis. No evidence of postseptal cellulitis or abscess. Electronically Signed   By: ALogan BoresM.D.   On: 12/21/2020 15:22    Scheduled Meds: . Chlorhexidine Gluconate Cloth  6 each Topical Q0600  . enoxaparin (LOVENOX) injection  40 mg Subcutaneous Q24H  . mupirocin ointment  1 application Nasal BID  . nicotine  14 mg Transdermal Daily  . sodium chloride flush  3 mL Intravenous Q12H   Continuous Infusions: . sodium chloride 10 mL/hr at 12/20/20 2115  . sodium chloride 75 mL/hr at 12/22/20 0936  . vancomycin  750 mg (12/22/20 0437)     LOS: 2 days   Time spent: 30 minutes   Darliss Cheney, MD [facility]  12/22/2020, 10:48 AM   To contact the attending provider between 7A-7P or the covering provider during after hours 7P-7A, please log into the web site [email or web address].

## 2020-12-22 NOTE — Progress Notes (Signed)
[facility]  Date of Admission:  12/19/2020      Lines:  Peripheral iv's  Abx: 12/23-c cefazolin  12/24 vanc                                                         Assessment: 29 y.o. 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 I am surprised she did ok until this point. In 04/2020 had GAS septic shock; bcx no mrsa, and didn't have any discharge abx either. She came today with mrsa bacteremia again, and I would say this is probably a new infection not relapse given the time interval since 09/2019. Of note, uds again was positive for amphetamine although she reports not using for 4 months  She has diffuse superficial ulcerating rash that started as pimples; some are pustules and carbuncles. 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 It doesn't appear to be embolic/ecthyma. It doesn't appear to be vasculitic/cryo (she does have hep c) or herpetic either. Will check for syphilis though given myriad presentation potential  uds amphetamine but patient mention due to adderal. 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 Orbit ct showed only preseptal cellulitis Clinically rash better and eye pain left side better; no visual disturbance rpr negative Hep c pcr and treponema ab pending tte no vegetation  Plan: 1. Please get tee; high burden  bacteremia 12/23 and 24 and has previous endocarditis not treated 2. F/u Repeat bcx today 3. f/u hep c rna and and treponemal antibody 4. Continue vancomycin 5. Appreciate ophthalmology input   Principal Problem:   Sepsis ([place]) Active Problems:   Hepatitis C, chronic, maternal, antepartum ([place])   Cigarette smoker   Polysubstance dependence including opioid type drug without complication, episodic abuse ([place])   Endocarditis   Rash   Scheduled Meds: . Chlorhexidine Gluconate Cloth  6 each Topical Q0600  . enoxaparin (LOVENOX) injection  40 mg Subcutaneous Q24H  . mupirocin ointment  1 application Nasal BID  . nicotine  14 mg Transdermal Daily  . sodium chloride flush  3 mL Intravenous Q12H   Continuous Infusions: . sodium chloride 10 mL/hr at 12/20/20 2115  . vancomycin 750 mg (12/22/20 1606)   PRN Meds:.sodium chloride, acetaminophen **OR** acetaminophen, albuterol, ibuprofen, LORazepam, ondansetron **OR** ondansetron (ZOFRAN) IV   SUBJECTIVE: Feels better with rash and left eye pain No visual disturbance No n/v/diarrhea Fatigue remains  Review of Systems: ROS Other ros negative  Allergies  Allergen Reactions  . Dextromethorphan Swelling    Facial swelling    OBJECTIVE: Vitals:   12/22/20 0100 12/22/20 0430 12/22/20 0914 12/22/20 1206  BP: 126/81 130/88 122/88 111/81  Pulse: 98 99 94 97  Resp: 12 15  20 19  Temp: 98.1 F (36.7 C) 98.2 F (36.8 C) 97.8 F (36.6 C) 98.1 F (36.7 C)  TempSrc: Oral Oral Oral Oral  SpO2: 99% 98% 100% 100%  Weight:      Height:       Body mass index is 24.22 kg/m.  Physical Exam General no distress; cooperative Heent: atraumatic; per; conj mild left side injection; left periorbial swelling/redness improved; oropharynx clear Skin: stable diffuse facial/truncal/ext scabbed ulcers; no new lesions; the carbuncle/pustule on left upper eye lid improved cv rrr no mrg abd s/nt Neuro nonfocal msk no back/peripheral joint  tenderness  Lab Results Lab Results  Component Value Date   WBC 6.2 12/22/2020   HGB 10.7 (L) 12/22/2020   HCT 32.9 (L) 12/22/2020   MCV 94.5 12/22/2020   PLT 301 12/22/2020    Lab Results  Component Value Date   CREATININE 0.77 12/22/2020   BUN 10 12/22/2020   NA 135 12/22/2020   K 4.2 12/22/2020   CL 104 12/22/2020   CO2 20 (L) 12/22/2020    Lab Results  Component Value Date   ALT 43 12/22/2020   AST 37 12/22/2020   ALKPHOS 53 12/22/2020   BILITOT 0.4 12/22/2020     Microbiology: Recent Results (from the past 240 hour(s))  Blood culture (routine x 2)     Status: None (Preliminary result)   Collection Time: 12/19/20 11:00 PM   Specimen: Left Antecubital; Blood  Result Value Ref Range Status   Specimen Description   Final    LEFT ANTECUBITAL Performed at [facility], [address] [place], [place] [address]    Special Requests   Final    BOTTLES DRAWN AEROBIC AND ANAEROBIC Blood Culture adequate volume Performed at [facility], [address] [place], [place] [address]    Culture   Final    NO GROWTH 2 DAYS Performed at [facility], [place] [address], [place], [place] [address]    Report Status PENDING  Incomplete  Blood culture (routine x 2)     Status: Abnormal (Preliminary result)   Collection Time: 12/19/20 11:09 PM   Specimen: BLOOD RIGHT FOREARM  Result Value Ref Range Status   Specimen Description   Final    BLOOD RIGHT FOREARM Performed at [facility], [address] [place], [place] [address]    Special Requests   Final    BOTTLES DRAWN AEROBIC AND ANAEROBIC Blood Culture results may not be optimal due to an excessive volume of blood received in culture bottles Performed at [facility], [address] [place], [place] [address]    Culture  Setup Time   Final    GRAM POSITIVE COCCI AEROBIC BOTTLE ONLY CRITICAL VALUE NOTED.  VALUE IS CONSISTENT WITH PREVIOUSLY REPORTED AND CALLED  VALUE. Performed at [facility], [place] [address] [place], [place] [address]    Culture STAPHYLOCOCCUS AUREUS (A)  Final   Report Status PENDING  Incomplete  Resp Panel by RT-PCR (Flu A&B, Covid) Nasopharyngeal Swab     Status: None   Collection Time: 12/19/20 11:54 PM   Specimen: Nasopharyngeal Swab; Nasopharyngeal(NP) swabs in vial transport medium  Result Value Ref Range Status   SARS Coronavirus 2 by RT PCR NEGATIVE NEGATIVE Final    Comment: (NOTE) SARS-CoV-2 target nucleic acids are NOT DETECTED.  The SARS-CoV-2 RNA is generally detectable in upper respiratory specimens during the acute phase of infection. The  lowest concentration of SARS-CoV-2 viral copies this assay can detect is 138 copies/mL. A negative result does not preclude SARS-Cov-2 infection and should not be used as the sole basis for treatment or other patient management decisions. A negative result may occur with  improper specimen collection/handling, submission of specimen other than nasopharyngeal swab, presence of viral mutation(s) within the areas targeted by this assay, and inadequate number of viral copies(<138 copies/mL). A negative result must be combined with clinical observations, patient history, and epidemiological information. The expected result is Negative.  Fact Sheet for Patients:  EntrepreneurPulse.com.au  Fact Sheet for Healthcare Providers:  IncredibleEmployment.be  This test is no t yet approved or cleared by the [place] FDA and  has been authorized for detection and/or diagnosis of SARS-CoV-2 by FDA under an Emergency Use Authorization (EUA). This EUA will remain  in effect (meaning this test can be used) for the duration of the COVID-19 declaration under Section 564(b)(1) of the Act, 21 U.S.C.section 360bbb-3(b)(1), unless the authorization is terminated  or revoked sooner.       Influenza A by PCR NEGATIVE NEGATIVE Final   Influenza  B by PCR NEGATIVE NEGATIVE Final    Comment: (NOTE) The Xpert Xpress SARS-CoV-2/FLU/RSV plus assay is intended as an aid in the diagnosis of influenza from Nasopharyngeal swab specimens and should not be used as a sole basis for treatment. Nasal washings and aspirates are unacceptable for Xpert Xpress SARS-CoV-2/FLU/RSV testing.  Fact Sheet for Patients: EntrepreneurPulse.com.au  Fact Sheet for Healthcare Providers: IncredibleEmployment.be  This test is not yet approved or cleared by the [place] FDA and has been authorized for detection and/or diagnosis of SARS-CoV-2 by FDA under an Emergency Use Authorization (EUA). This EUA will remain in effect (meaning this test can be used) for the duration of the COVID-19 declaration under Section 564(b)(1) of the Act, 21 U.S.C. section 360bbb-3(b)(1), unless the authorization is terminated or revoked.  Performed at [facility], [address] [place], [place] [address]   Culture, blood (single)     Status: Abnormal (Preliminary result)   Collection Time: 12/20/20  4:53 AM   Specimen: Left Antecubital; Blood  Result Value Ref Range Status   Specimen Description   Final    LEFT ANTECUBITAL Performed at [facility], [address] [place], [place] [address]    Special Requests   Final    BOTTLES DRAWN AEROBIC AND ANAEROBIC Blood Culture adequate volume Performed at [facility], [address] [place], [place] [address]    Culture  Setup Time   Final    IN BOTH AEROBIC AND ANAEROBIC BOTTLES GRAM POSITIVE COCCI Organism ID to follow CRITICAL RESULT CALLED TO, READ BACK BY AND VERIFIED WITH: C AMEND PHARMD 12/21/20 0228 JDW    Culture (A)  Final    STAPHYLOCOCCUS AUREUS SUSCEPTIBILITIES TO FOLLOW Performed at [facility], [place] [address] [place], [place] [address]    Report Status PENDING  Incomplete  Blood Culture ID Panel (Reflexed)     Status:  Abnormal   Collection Time: 12/20/20  4:53 AM  Result Value Ref Range Status   Enterococcus faecalis NOT DETECTED NOT DETECTED Final   Enterococcus Faecium NOT DETECTED NOT DETECTED Final   Listeria monocytogenes NOT DETECTED NOT DETECTED Final   Staphylococcus species DETECTED (A) NOT DETECTED Final    Comment: CRITICAL RESULT CALLED TO, READ BACK BY AND VERIFIED WITH: C AMEND PHARMD 12/21/20 0228 JDW  Staphylococcus aureus (BCID) DETECTED (A) NOT DETECTED Final    Comment: Methicillin (oxacillin)-resistant Staphylococcus aureus (MRSA). MRSA is predictably resistant to beta-lactam antibiotics (except ceftaroline). Preferred therapy is vancomycin unless clinically contraindicated. Patient requires contact precautions if  hospitalized. CRITICAL RESULT CALLED TO, READ BACK BY AND VERIFIED WITH: C AMEND PHARMD 12/21/20 0228 JDW    Staphylococcus epidermidis DETECTED (A) NOT DETECTED Final    Comment: CRITICAL RESULT CALLED TO, READ BACK BY AND VERIFIED WITH: C AMEND PHARMD 12/21/20 0228 JDW    Staphylococcus lugdunensis NOT DETECTED NOT DETECTED Final   Streptococcus species NOT DETECTED NOT DETECTED Final   Streptococcus agalactiae NOT DETECTED NOT DETECTED Final   Streptococcus pneumoniae NOT DETECTED NOT DETECTED Final   Streptococcus pyogenes NOT DETECTED NOT DETECTED Final   A.calcoaceticus-baumannii NOT DETECTED NOT DETECTED Final   Bacteroides fragilis NOT DETECTED NOT DETECTED Final   Enterobacterales NOT DETECTED NOT DETECTED Final   Enterobacter cloacae complex NOT DETECTED NOT DETECTED Final   Escherichia coli NOT DETECTED NOT DETECTED Final   Klebsiella aerogenes NOT DETECTED NOT DETECTED Final   Klebsiella oxytoca NOT DETECTED NOT DETECTED Final   Klebsiella pneumoniae NOT DETECTED NOT DETECTED Final   Proteus species NOT DETECTED NOT DETECTED Final   Salmonella species NOT DETECTED NOT DETECTED Final   Serratia marcescens NOT DETECTED NOT DETECTED Final   Haemophilus  influenzae NOT DETECTED NOT DETECTED Final   Neisseria meningitidis NOT DETECTED NOT DETECTED Final   Pseudomonas aeruginosa NOT DETECTED NOT DETECTED Final   Stenotrophomonas maltophilia NOT DETECTED NOT DETECTED Final   Candida albicans NOT DETECTED NOT DETECTED Final   Candida auris NOT DETECTED NOT DETECTED Final   Candida glabrata NOT DETECTED NOT DETECTED Final   Candida krusei NOT DETECTED NOT DETECTED Final   Candida parapsilosis NOT DETECTED NOT DETECTED Final   Candida tropicalis NOT DETECTED NOT DETECTED Final   Cryptococcus neoformans/gattii NOT DETECTED NOT DETECTED Final   Methicillin resistance mecA/C DETECTED (A) NOT DETECTED Final    Comment: CRITICAL RESULT CALLED TO, READ BACK BY AND VERIFIED WITH: C AMEND PHARMD 12/21/20 0228 JDW    Meth resistant mecA/C and MREJ DETECTED (A) NOT DETECTED Final    Comment: CRITICAL RESULT CALLED TO, READ BACK BY AND VERIFIED WITH: C AMEND PHARMD 12/21/20 0228 JDW Performed at [facility], [address], [place], [place] [address]   MRSA PCR Screening     Status: Abnormal   Collection Time: 12/20/20  6:15 PM   Specimen: Nasal Mucosa; Nasopharyngeal  Result Value Ref Range Status   MRSA by PCR POSITIVE (A) NEGATIVE Final    Comment:        The GeneXpert MRSA Assay (FDA approved for NASAL specimens only), is one component of a comprehensive MRSA colonization surveillance program. It is not intended to diagnose MRSA infection nor to guide or monitor treatment for MRSA infections. RESULT CALLED TO, READ BACK BY AND VERIFIED WITH: RN T. TIE [id number] FCP Performed at [place] [address], [place], [place] [address]   Culture, blood (routine x 2)     Status: None (Preliminary result)   Collection Time: 12/22/20  6:46 AM   Specimen: BLOOD RIGHT WRIST  Result Value Ref Range Status   Specimen Description BLOOD RIGHT WRIST  Final   Special Requests   Final    BOTTLES DRAWN AEROBIC ONLY Blood Culture  adequate volume   Culture   Final    NO GROWTH < 12 HOURS Performed  at [facility], [place] [address] [place], [place] [address]    Report Status PENDING  Incomplete  Culture, blood (Routine X 2) w Reflex to ID Panel     Status: None (Preliminary result)   Collection Time: 12/22/20  9:11 AM   Specimen: BLOOD  Result Value Ref Range Status   Specimen Description BLOOD BLOOD LEFT WRIST  Final   Special Requests   Final    BOTTLES DRAWN AEROBIC ONLY Blood Culture adequate volume   Culture   Final    NO GROWTH < 12 HOURS Performed at [facility], [place] [address], [place], [place] [address]    Report Status PENDING  Incomplete    Serology: Hiv/rpr negative Acute hep a/b panel negative  Hep c ab positive; pending pcr Pending treponemal ab  Imaging: 12/25 orbit ct Reviewed; no evidence orbital cellulitis/abscess Left orbital preseptal soft tissue swelling compatible with cellulitis. No evidence of postseptal cellulitis or abscess.  Jabier Mutton, [place] [place] [phone] pager    12/22/2020, 4:39 PM

## 2020-12-23 ENCOUNTER — Encounter (HOSPITAL_COMMUNITY): Payer: Self-pay | Admitting: Internal Medicine

## 2020-12-23 DIAGNOSIS — A419 Sepsis, unspecified organism: Secondary | ICD-10-CM | POA: Diagnosis not present

## 2020-12-23 LAB — COMPREHENSIVE METABOLIC PANEL
ALT: 50 U/L — ABNORMAL HIGH (ref 0–44)
AST: 45 U/L — ABNORMAL HIGH (ref 15–41)
Albumin: 3 g/dL — ABNORMAL LOW (ref 3.5–5.0)
Alkaline Phosphatase: 57 U/L (ref 38–126)
Anion gap: 11 (ref 5–15)
BUN: 9 mg/dL (ref 6–20)
CO2: 23 mmol/L (ref 22–32)
Calcium: 8.8 mg/dL — ABNORMAL LOW (ref 8.9–10.3)
Chloride: 100 mmol/L (ref 98–111)
Creatinine, Ser: 0.96 mg/dL (ref 0.44–1.00)
GFR, Estimated: >60 mL/min (ref >60)
Glucose, Bld: 132 mg/dL — ABNORMAL HIGH (ref 70–99)
Potassium: 4 mmol/L (ref 3.5–5.1)
Sodium: 134 mmol/L — ABNORMAL LOW (ref 135–145)
Total Bilirubin: 0.4 mg/dL (ref 0.3–1.2)
Total Protein: 6.4 g/dL — ABNORMAL LOW (ref 6.5–8.1)

## 2020-12-23 LAB — MAGNESIUM: Magnesium: 1.9 mg/dL (ref 1.7–2.4)

## 2020-12-23 LAB — CBC WITH DIFFERENTIAL/PLATELET
Abs Immature Granulocytes: 0.02 10*3/uL (ref 0.00–0.07)
Basophils Absolute: 0 10*3/uL (ref 0.0–0.1)
Basophils Relative: 1 %
Eosinophils Absolute: 0.2 10*3/uL (ref 0.0–0.5)
Eosinophils Relative: 4 %
HCT: 33.8 % — ABNORMAL LOW (ref 36.0–46.0)
Hemoglobin: 11.1 g/dL — ABNORMAL LOW (ref 12.0–15.0)
Immature Granulocytes: 0 %
Lymphocytes Relative: 50 %
Lymphs Abs: 2.8 10*3/uL (ref 0.7–4.0)
MCH: 30.8 pg (ref 26.0–34.0)
MCHC: 32.8 g/dL (ref 30.0–36.0)
MCV: 93.9 fL (ref 80.0–100.0)
Monocytes Absolute: 0.6 10*3/uL (ref 0.1–1.0)
Monocytes Relative: 12 %
Neutro Abs: 1.8 10*3/uL (ref 1.7–7.7)
Neutrophils Relative %: 33 %
Platelets: 295 10*3/uL (ref 150–400)
RBC: 3.6 MIL/uL — ABNORMAL LOW (ref 3.87–5.11)
RDW: 12.8 % (ref 11.5–15.5)
WBC: 5.5 10*3/uL (ref 4.0–10.5)
nRBC: 0 % (ref 0.0–0.2)

## 2020-12-23 LAB — VANCOMYCIN, TROUGH
Vancomycin Tr: 39 ug/mL (ref 15–20)
Vancomycin Tr: 9 ug/mL — ABNORMAL LOW (ref 15–20)

## 2020-12-23 MED ORDER — VANCOMYCIN HCL 500 MG/100ML IV SOLN
500.0000 mg | Freq: Once | INTRAVENOUS | Status: AC
  Administered 2020-12-23: 18:00:00 500 mg via INTRAVENOUS
  Filled 2020-12-23: qty 100

## 2020-12-23 MED ORDER — SODIUM CHLORIDE 0.9 % IV SOLN: INTRAVENOUS | Status: DC

## 2020-12-23 MED ORDER — VANCOMYCIN HCL 1250 MG/250ML IV SOLN
1250.0000 mg | Freq: Two times a day (BID) | INTRAVENOUS | Status: DC
  Administered 2020-12-24 – 2021-01-02 (×19): 1250 mg via INTRAVENOUS
  Filled 2020-12-23 (×20): qty 250

## 2020-12-23 NOTE — Progress Notes (Signed)
PROGRESS NOTE    [name]  VXB:[id number] DOB: [birth year] DOA: 12/19/2020 PCP: Merryl Hacker, No   Brief Narrative:  [name] is a 29 y.o. 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  She has had similar symptoms like this previously in the past.  Current episode started about 4 days ago.  Initially rash started out as little pimples that sometimes pop on their own or develop into abscesses.  Denied any significant fever, chills, chest pain, cough, shortness of breath, leg swelling, abdominal pain, or blood in stool.  Last admitted into the hospital in May of this year for septic shock with tricuspid valve endocarditis related to strep pyrogens bacteremia. she only stayed in hospital couple days prior to leaving [place] and never followed up with any provider. She claims to be clean from IV drugs for 120 days after getting out of jail on August 19.  Upon admission into the emergency department patient was hemodynamically stable other than mild tachycardia.  Urine drug screen was positive for amphetamines.  Chest x-ray showed no acute abnormalities.  ID consulted and recommended checking 3 sets of blood cultures starting cefazolin.   Assessment & Plan:   Principal Problem:   Sepsis ([place]) Active Problems:   Hepatitis C, chronic, maternal, antepartum ([place])   Cigarette smoker   Polysubstance dependence including opioid type drug without complication, episodic abuse ([place])   Endocarditis   Rash   Sepsis secondary to possible endocarditis: Patient met sepsis criteria due to tachycardic with white blood cell count elevated up to 12.5 meeting SIRS criteria. Previously positive for group A strep (strep pyogenes) when admitted back in May with tricuspid  valve endocarditis, but left AMA prior to receiving full recommended treatment. CRP was elevated at 6.3 and sedimentation rate was 40. ID is on board.  Her repeat transthoracic echo does not show any endocarditis, Blood culture growing MRSA and staph epidermidis. Repeat blood culture from 12/22/2020 - thus far.  Cardiology for TEE tomorrow.  Further management per ID.  Unknown skin lesions, possibly carbuncles:. Patient presents with complaints of multiple skin ulcerations mostly concentrated on the face and neck.  Seem to be carbuncles.  ID pursuing further work-up for syphilis and others.  Asymptomatic bacteriuria: Does not need antibiotics for this.  Hyponatremia: Resolved.  Elevated transaminases  history of chronic hepatitis C: Acute.  LFTs are stable.  Polysubstance abuse: Patient reports being clean from IV drug use for the last 120 days. Urine drug screen was positive for amphetamines however she tells me that she takes Adderall. Prior history of abuse also includes heroin, cocaine, and marijuana. -Continue to monitor for withdrawal symptoms.  Ativan IV if needed -Continue counseling on need of cessation of drug use  Mild left preseptal cellulitis: ID was concerned about possible osteomyelitis.  Patient has no visual symptoms or deep pain in the left eye.  Per ID recommendation, ophthalmology was consulted and she was ruled out of the mellitus and they opined that she likely has preseptal cellulitis for which she is already on antibiotics.  Patient continues to improve every day.  No problem with vision.  Mild AKI: Secondary to sepsis.  Resolved.    DVT prophylaxis: enoxaparin (LOVENOX) injection 40 mg Start: 12/20/20 1700   Code Status: Full Code  Family Communication: None present at bedside.  Plan of care discussed with patient in length.  Status is: Inpatient  Remains inpatient appropriate because:Inpatient level of care appropriate due to severity of illness   Dispo: The  patient is from: Home              Anticipated d/c is to: Home              Anticipated d/c date is: > 3 days              Patient currently is not medically stable to d/c.        Estimated body mass index is 24.22 kg/m as calculated from the following:   Height as of this encounter: 5' (1.524 m).   Weight as of this encounter: 56.2 kg.      Nutritional status:               Consultants:   ID  Ophthalmology  Procedures:   None  Antimicrobials:  Anti-infectives (From admission, onward)   Start     Dose/Rate Route Frequency Ordered Stop   12/21/20 1600  vancomycin (VANCOREADY) IVPB 750 mg/150 mL        750 mg 150 mL/hr over 60 Minutes Intravenous Every 12 hours 12/21/20 0249     12/21/20 0300  vancomycin (VANCOCIN) IVPB 1000 mg/200 mL premix        1,000 mg 200 mL/hr over 60 Minutes Intravenous STAT 12/21/20 0249 12/21/20 0711   12/20/20 0600  ceFAZolin (ANCEF) IVPB 1 g/50 mL premix  Status:  Discontinued        1 g 100 mL/hr over 30 Minutes Intravenous Every 8 hours 12/19/20 2254 12/21/20 0242         Subjective: Seen and examined.  Feeling much better.  Lesions on the skin are drying up and no problem with vision.  Objective: Vitals:   12/23/20 0045 12/23/20 0313 12/23/20 0811 12/23/20 1208  BP: 122/81 124/62 111/71 102/70  Pulse: 97 88 81   Resp: '16 20 20 14  ' Temp: 98.2 F (36.8 C) 99.1 F (37.3 C) 97.8 F (36.6 C) 97.9 F (36.6 C)  TempSrc: Oral Oral Oral Oral  SpO2: 96% 97% 97% 96%  Weight:      Height:        Intake/Output Summary (Last 24 hours) at 12/23/2020 1227 Last data filed at 12/22/2020 2100 Gross per 24 hour  Intake 120 ml  Output --  Net 120 ml   Filed Weights   12/19/20 1634  Weight: 56.2 kg    Examination:  General exam: Appears calm and comfortable  Respiratory system: Clear to auscultation. Respiratory effort normal. Cardiovascular system: S1 & S2 heard, RRR. No JVD, murmurs, rubs, gallops or clicks. No  pedal edema. Gastrointestinal system: Abdomen is nondistended, soft and nontender. No organomegaly or masses felt. Normal bowel sounds heard. Central nervous system: Alert and oriented. No focal neurological deficits. Extremities: Symmetric 5 x 5 power. Skin: Multiple skin lesions/carbuncles on the face and sparingly on the extremities bilaterally upper and lower Psychiatry: Judgement and insight appear normal. Mood & affect appropriate.    Data Reviewed: I have personally reviewed following labs and imaging studies  CBC: Recent Labs  Lab 12/19/20 1647 12/21/20 0104 12/22/20 0640 12/23/20 0349  WBC 12.5* 10.3 6.2 5.5  NEUTROABS 8.4*  --  2.4 1.8  HGB 12.7 11.6* 10.7* 11.1*  HCT 38.0 35.0* 32.9* 33.8*  MCV 94.3 94.9 94.5 93.9  PLT 344 315 301 749   Basic Metabolic Panel:  Recent Labs  Lab 12/19/20 1647 12/21/20 0104 12/22/20 0640 12/23/20 0349  NA 133* 135 135 134*  K 3.9 4.3 4.2 4.0  CL 96* 98 104 100  CO2 26 25 20* 23  GLUCOSE 115* 93 98 132*  BUN '12 16 10 9  ' CREATININE 0.90 1.06* 0.77 0.96  CALCIUM 8.9 8.8* 8.4* 8.8*  MG  --   --   --  1.9   GFR: Estimated Creatinine Clearance: 67.4 mL/min (by C-G formula based on SCr of 0.96 mg/dL). Liver Function Tests: Recent Labs  Lab 12/19/20 1647 12/21/20 0104 12/22/20 0640 12/23/20 0349  AST 43* 48* 37 45*  ALT 59* 47* 43 50*  ALKPHOS 67 61 53 57  BILITOT 0.7 1.0 0.4 0.4  PROT 7.5 6.6 6.1* 6.4*  ALBUMIN 3.9 3.3* 3.0* 3.0*   No results for input(s): LIPASE, AMYLASE in the last 168 hours. No results for input(s): AMMONIA in the last 168 hours. Coagulation Profile: No results for input(s): INR, PROTIME in the last 168 hours. Cardiac Enzymes: No results for input(s): CKTOTAL, CKMB, CKMBINDEX, TROPONINI in the last 168 hours. BNP (last 3 results) No results for input(s): PROBNP in the last 8760 hours. HbA1C: No results for input(s): HGBA1C in the last 72 hours. CBG: No results for input(s): GLUCAP in the last 168  hours. Lipid Profile: No results for input(s): CHOL, HDL, LDLCALC, TRIG, CHOLHDL, LDLDIRECT in the last 72 hours. Thyroid Function Tests: No results for input(s): TSH, T4TOTAL, FREET4, T3FREE, THYROIDAB in the last 72 hours. Anemia Panel: No results for input(s): VITAMINB12, FOLATE, FERRITIN, TIBC, IRON, RETICCTPCT in the last 72 hours. Sepsis Labs: Recent Labs  Lab 12/19/20 1647  LATICACIDVEN 1.7    Recent Results (from the past 240 hour(s))  Blood culture (routine x 2)     Status: None (Preliminary result)   Collection Time: 12/19/20 11:00 PM   Specimen: Left Antecubital; Blood  Result Value Ref Range Status   Specimen Description   Final    LEFT ANTECUBITAL Performed at [facility], [address] [place], [place] [address]    Special Requests   Final    BOTTLES DRAWN AEROBIC AND ANAEROBIC Blood Culture adequate volume Performed at [facility], [address] [place], [place] [address]    Culture   Final    NO GROWTH 3 DAYS Performed at [facility], [place] [address], [place],  [address]    Report Status PENDING  Incomplete  Blood culture (routine x 2)     Status: Abnormal (Preliminary result)   Collection Time: 12/19/20 11:09 PM   Specimen: BLOOD RIGHT FOREARM  Result Value Ref Range Status   Specimen Description   Final    BLOOD RIGHT FOREARM Performed at [facility], [address] [place], [place] [address]    Special Requests   Final    BOTTLES DRAWN AEROBIC AND ANAEROBIC Blood Culture results may not be optimal due to an excessive volume of blood received in culture bottles Performed at [facility], [address] [place], [place] [address]    Culture  Setup Time   Final    GRAM POSITIVE COCCI AEROBIC BOTTLE ONLY CRITICAL VALUE NOTED.  VALUE IS CONSISTENT WITH PREVIOUSLY REPORTED AND CALLED VALUE.    Culture (A)  Final    STAPHYLOCOCCUS AUREUS SUSCEPTIBILITIES PERFORMED ON PREVIOUS CULTURE  WITHIN THE LAST 5 DAYS. CULTURE REINCUBATED FOR BETTER GROWTH Performed at [facility], [place] [address] [place], [place] [address]    Report Status PENDING  Incomplete  Resp Panel by RT-PCR (Flu A&B, Covid) Nasopharyngeal Swab     Status: None   Collection Time: 12/19/20 11:54 PM   Specimen: Nasopharyngeal Swab; Nasopharyngeal(NP) swabs in vial transport medium  Result Value Ref Range Status   SARS Coronavirus 2 by RT PCR NEGATIVE NEGATIVE Final    Comment: (NOTE) SARS-CoV-2 target nucleic acids are NOT DETECTED.  The SARS-CoV-2 RNA is generally detectable in upper respiratory specimens during the acute phase of infection. The lowest concentration of SARS-CoV-2 viral copies this assay can detect is 138 copies/mL. A negative result does not preclude SARS-Cov-2 infection and should not be used as the sole basis for treatment or other patient management decisions. A negative result may occur with  improper specimen collection/handling, submission of specimen other than nasopharyngeal swab, presence of viral mutation(s) within the areas targeted by this assay, and inadequate number of viral copies(<138 copies/mL). A negative result must be combined with clinical observations, patient history, and epidemiological information. The expected result is Negative.  Fact Sheet for Patients:  EntrepreneurPulse.com.au  Fact Sheet for Healthcare Providers:  IncredibleEmployment.be  This test is no t yet approved or cleared by the [place] FDA and  has been authorized for detection and/or diagnosis of SARS-CoV-2 by FDA under an Emergency Use Authorization (EUA). This EUA will remain  in effect (meaning this test can be used) for the duration of the COVID-19 declaration under Section 564(b)(1) of the Act, 21 U.S.C.section 360bbb-3(b)(1), unless the authorization is terminated  or revoked sooner.       Influenza A by PCR NEGATIVE NEGATIVE Final    Influenza B by PCR NEGATIVE NEGATIVE Final    Comment: (NOTE) The Xpert Xpress SARS-CoV-2/FLU/RSV plus assay is intended as an aid in the diagnosis of influenza from Nasopharyngeal swab specimens and should not be used as a sole basis for treatment. Nasal washings and aspirates are unacceptable for Xpert Xpress SARS-CoV-2/FLU/RSV testing.  Fact Sheet for Patients: EntrepreneurPulse.com.au  Fact Sheet for Healthcare Providers: IncredibleEmployment.be  This test is not yet approved or cleared by the [place] FDA and has been authorized for detection and/or diagnosis of SARS-CoV-2 by FDA under an Emergency Use Authorization (EUA). This EUA will remain in effect (meaning this test can be used) for the duration of the COVID-19 declaration under Section 564(b)(1) of the Act, 21 U.S.C. section 360bbb-3(b)(1), unless the authorization is terminated or revoked.  Performed at [facility], [address] [place], [place] [address]   Culture, blood (single)     Status: Abnormal (Preliminary result)   Collection Time: 12/20/20  4:53 AM   Specimen: Left Antecubital; Blood  Result Value Ref Range Status   Specimen Description   Final    LEFT ANTECUBITAL Performed at [facility], [address] [place], [place] [address]    Special Requests   Final    BOTTLES DRAWN AEROBIC AND ANAEROBIC Blood Culture adequate volume Performed at [facility], [address] [place], [place] [address]    Culture  Setup Time   Final    IN BOTH AEROBIC AND ANAEROBIC BOTTLES GRAM POSITIVE COCCI Organism ID to follow CRITICAL RESULT CALLED TO, READ BACK BY AND VERIFIED WITH: C AMEND PHARMD 12/21/20 0228 JDW    Culture (A)  Final    METHICILLIN RESISTANT STAPHYLOCOCCUS AUREUS CULTURE REINCUBATED FOR BETTER GROWTH Performed at [facility], [place] [address] [place], [place] [address]    Report Status PENDING  Incomplete    Organism ID, Bacteria METHICILLIN RESISTANT STAPHYLOCOCCUS AUREUS  Final      Susceptibility   Methicillin resistant staphylococcus aureus - MIC*    CIPROFLOXACIN >=8 RESISTANT Resistant     ERYTHROMYCIN >=8 RESISTANT Resistant     GENTAMICIN <=0.5 SENSITIVE Sensitive     OXACILLIN >=4 RESISTANT Resistant     TETRACYCLINE <=1 SENSITIVE Sensitive     VANCOMYCIN 1 SENSITIVE Sensitive     TRIMETH/SULFA >=320 RESISTANT Resistant     CLINDAMYCIN >=8 RESISTANT Resistant     RIFAMPIN <=0.5 SENSITIVE Sensitive     Inducible Clindamycin NEGATIVE Sensitive     * METHICILLIN RESISTANT STAPHYLOCOCCUS AUREUS  Blood Culture ID Panel (Reflexed)     Status: Abnormal   Collection Time: 12/20/20  4:53 AM  Result Value Ref Range Status   Enterococcus faecalis NOT DETECTED NOT DETECTED Final   Enterococcus Faecium NOT DETECTED NOT DETECTED Final   Listeria monocytogenes NOT DETECTED NOT DETECTED Final   Staphylococcus species DETECTED (A) NOT DETECTED Final    Comment: CRITICAL RESULT CALLED TO, READ BACK BY AND VERIFIED WITH: C AMEND PHARMD 12/21/20 0228 JDW    Staphylococcus aureus (BCID) DETECTED (A) NOT DETECTED Final    Comment: Methicillin (oxacillin)-resistant Staphylococcus aureus (MRSA). MRSA is predictably resistant to beta-lactam antibiotics (except ceftaroline). Preferred therapy is vancomycin unless clinically contraindicated. Patient requires contact precautions if  hospitalized. CRITICAL RESULT CALLED TO, READ BACK BY AND VERIFIED WITH: C AMEND PHARMD 12/21/20 0228 JDW    Staphylococcus epidermidis DETECTED (A) NOT DETECTED Final    Comment: CRITICAL RESULT CALLED TO, READ BACK BY AND VERIFIED WITH: C AMEND PHARMD 12/21/20 0228 JDW    Staphylococcus lugdunensis NOT DETECTED NOT DETECTED Final   Streptococcus species NOT DETECTED NOT DETECTED Final   Streptococcus agalactiae NOT DETECTED NOT DETECTED Final   Streptococcus pneumoniae NOT DETECTED NOT DETECTED Final   Streptococcus  pyogenes NOT DETECTED NOT DETECTED Final   A.calcoaceticus-baumannii NOT DETECTED NOT DETECTED Final   Bacteroides fragilis NOT DETECTED NOT DETECTED Final   Enterobacterales NOT DETECTED NOT DETECTED Final   Enterobacter cloacae complex NOT DETECTED NOT DETECTED Final   Escherichia coli NOT DETECTED NOT DETECTED Final   Klebsiella aerogenes NOT DETECTED NOT DETECTED Final   Klebsiella oxytoca NOT DETECTED NOT DETECTED Final   Klebsiella pneumoniae NOT DETECTED NOT DETECTED Final   Proteus species NOT DETECTED NOT DETECTED Final   Salmonella species NOT DETECTED NOT DETECTED Final   Serratia marcescens NOT DETECTED NOT DETECTED Final   Haemophilus influenzae NOT DETECTED NOT DETECTED Final   Neisseria meningitidis NOT DETECTED NOT DETECTED Final   Pseudomonas aeruginosa NOT DETECTED NOT DETECTED Final   Stenotrophomonas maltophilia NOT DETECTED NOT DETECTED Final   Candida albicans NOT DETECTED NOT DETECTED Final   Candida auris NOT DETECTED NOT DETECTED Final   Candida glabrata NOT DETECTED NOT DETECTED Final   Candida krusei NOT DETECTED NOT DETECTED Final   Candida parapsilosis NOT DETECTED NOT DETECTED Final   Candida tropicalis NOT DETECTED NOT DETECTED Final   Cryptococcus neoformans/gattii NOT DETECTED NOT DETECTED Final   Methicillin resistance mecA/C DETECTED (A) NOT DETECTED Final    Comment: CRITICAL RESULT CALLED TO, READ BACK BY AND VERIFIED WITH: C AMEND PHARMD 12/21/20 0228 JDW    Meth resistant mecA/C and MREJ DETECTED (A) NOT DETECTED Final    Comment: CRITICAL RESULT CALLED TO, READ BACK BY  AND VERIFIED WITH: C AMEND [facility] 12/21/20 0228 JDW Performed at [facility], [place] [address] [place], [place] [address]   MRSA PCR Screening     Status: Abnormal   Collection Time: 12/20/20  6:15 PM   Specimen: Nasal Mucosa; Nasopharyngeal  Result Value Ref Range Status   MRSA by PCR POSITIVE (A) NEGATIVE Final    Comment:        The GeneXpert MRSA Assay  (FDA approved for NASAL specimens only), is one component of a comprehensive MRSA colonization surveillance program. It is not intended to diagnose MRSA infection nor to guide or monitor treatment for MRSA infections. RESULT CALLED TO, READ BACK BY AND VERIFIED WITH: RN T. TIE [id number] FCP Performed at [place] [address], [place], [place] [address]   Culture, blood (routine x 2)     Status: None (Preliminary result)   Collection Time: 12/22/20  6:46 AM   Specimen: BLOOD RIGHT WRIST  Result Value Ref Range Status   Specimen Description BLOOD RIGHT WRIST  Final   Special Requests   Final    BOTTLES DRAWN AEROBIC ONLY Blood Culture adequate volume   Culture   Final    NO GROWTH < 24 HOURS Performed at [facility], [place] [address] [place], [place] [address]    Report Status PENDING  Incomplete  Culture, blood (Routine X 2) w Reflex to ID Panel     Status: None (Preliminary result)   Collection Time: 12/22/20  9:11 AM   Specimen: BLOOD  Result Value Ref Range Status   Specimen Description BLOOD BLOOD LEFT WRIST  Final   Special Requests   Final    BOTTLES DRAWN AEROBIC ONLY Blood Culture adequate volume   Culture   Final    NO GROWTH < 24 HOURS Performed at [facility], [place] [address] [place], [place] [address]    Report Status PENDING  Incomplete      Radiology Studies: ECHOCARDIOGRAM COMPLETE  Result Date: 12/21/2020    ECHOCARDIOGRAM REPORT   Patient Name:   [name] [facility] Date of Exam: 12/21/2020 Medical Rec #:  [id number]       Height:       60.0 in Accession #:    [id number]      Weight:       124.0 lb Date of Birth:  [birth year]       BSA:          1.523 m Patient Age:    29 years        BP:           117/79 mmHg Patient Gender: F               HR:           92 bpm. Exam Location:  Inpatient Procedure: 2D Echo Indications:    endocarditis  History:        Patient has prior history of Echocardiogram examinations, most                  recent 05/12/2020. Sepsis. Hepatitis C.; Risk Factors:Current                 Smoker and poly substance abuse.  Sonographer:    Johny Chess Referring Phys: [id number] RONDELL A SMITH IMPRESSIONS  1. Left ventricular ejection fraction, by estimation, is 60 to 65%. The left ventricle has normal function. The left ventricle has no regional wall  motion abnormalities. Left ventricular diastolic parameters were normal.  2. Right ventricular systolic function is normal. The right ventricular size is normal. There is normal pulmonary artery systolic pressure. The estimated right ventricular systolic pressure is 56.3 mmHg.  3. The mitral valve is grossly normal. Trivial mitral valve regurgitation. No evidence of mitral stenosis.  4. The aortic valve is tricuspid. Aortic valve regurgitation is not visualized. No aortic stenosis is present.  5. The inferior vena cava is normal in size with greater than 50% respiratory variability, suggesting right atrial pressure of 3 mmHg. Conclusion(s)/Recommendation(s): Normal biventricular function without evidence of hemodynamically significant valvular heart disease. No evidence of valvular vegetations on this transthoracic echocardiogram. Would recommend a transesophageal echocardiogram to exclude infective endocarditis if clinically indicated. FINDINGS  Left Ventricle: Left ventricular ejection fraction, by estimation, is 60 to 65%. The left ventricle has normal function. The left ventricle has no regional wall motion abnormalities. The left ventricular internal cavity size was normal in size. There is  no left ventricular hypertrophy. Left ventricular diastolic parameters were normal. Right Ventricle: The right ventricular size is normal. No increase in right ventricular wall thickness. Right ventricular systolic function is normal. There is normal pulmonary artery systolic pressure. The tricuspid regurgitant velocity is 2.74 m/s, and  with an assumed right atrial pressure of 3  mmHg, the estimated right ventricular systolic pressure is 14.9 mmHg. Left Atrium: Left atrial size was normal in size. Right Atrium: Right atrial size was normal in size. Pericardium: Trivial pericardial effusion is present. Mitral Valve: The mitral valve is grossly normal. Trivial mitral valve regurgitation. No evidence of mitral valve stenosis. Tricuspid Valve: The tricuspid valve is grossly normal. Tricuspid valve regurgitation is mild . No evidence of tricuspid stenosis. Aortic Valve: The aortic valve is tricuspid. Aortic valve regurgitation is not visualized. No aortic stenosis is present. Pulmonic Valve: The pulmonic valve was grossly normal. Pulmonic valve regurgitation is not visualized. No evidence of pulmonic stenosis. Aorta: The aortic root and ascending aorta are structurally normal, with no evidence of dilitation. Venous: The inferior vena cava is normal in size with greater than 50% respiratory variability, suggesting right atrial pressure of 3 mmHg. IAS/Shunts: The atrial septum is grossly normal.  LEFT VENTRICLE PLAX 2D LVIDd:         3.90 cm  Diastology LVIDs:         2.40 cm  LV e' medial:    10.10 cm/s LV PW:         0.90 cm  LV E/e' medial:  9.3 LV IVS:        0.80 cm  LV e' lateral:   18.40 cm/s LVOT diam:     1.80 cm  LV E/e' lateral: 5.1 LV SV:         49 LV SV Index:   32 LVOT Area:     2.54 cm  RIGHT VENTRICLE RV S prime:     17.70 cm/s TAPSE (M-mode): 2.0 cm LEFT ATRIUM             Index       RIGHT ATRIUM           Index LA diam:        3.70 cm 2.43 cm/m  RA Area:     10.20 cm LA Vol (A2C):   27.6 ml 18.15 ml/m RA Volume:   21.20 ml  13.92 ml/m LA Vol (A4C):   29.3 ml 19.23 ml/m LA Biplane Vol: 29.8 ml 19.56 ml/m  AORTIC  VALVE LVOT Vmax:   110.00 cm/s LVOT Vmean:  73.500 cm/s LVOT VTI:    0.193 m  AORTA Ao Root diam: 2.40 cm Ao Asc diam:  2.30 cm MITRAL VALVE               TRICUSPID VALVE MV Area (PHT): 3.85 cm    TR Peak grad:   30.0 mmHg MV Decel Time: 197 msec    TR Vmax:         274.00 cm/s MV E velocity: 94.10 cm/s MV A velocity: 61.80 cm/s  SHUNTS MV E/A ratio:  1.52        Systemic VTI:  0.19 m                            Systemic Diam: 1.80 cm Eleonore Chiquito MD Electronically signed by Eleonore Chiquito MD Signature Date/Time: 12/21/2020/3:57:08 PM    Final    CT ORBITS W CONTRAST  Result Date: 12/21/2020 CLINICAL DATA:  Left proptosis.  Bacteremia. EXAM: CT ORBITS WITH CONTRAST TECHNIQUE: Multidetector CT images was performed according to the standard protocol following intravenous contrast administration. CONTRAST:  15m OMNIPAQUE IOHEXOL 300 MG/ML  SOLN COMPARISON:  Head CT 05/21/2006 FINDINGS: Orbits: Mild-to-moderate left orbital preseptal soft tissue swelling. No fluid collection or postseptal inflammation. No fracture or destructive osseous process. Visualized sinuses: Paranasal sinuses and mastoid air cells are clear. Soft tissues: No additional findings. Limited intracranial: Unremarkable. IMPRESSION: Left orbital preseptal soft tissue swelling compatible with cellulitis. No evidence of postseptal cellulitis or abscess. Electronically Signed   By: ALogan BoresM.D.   On: 12/21/2020 15:22    Scheduled Meds: . Chlorhexidine Gluconate Cloth  6 each Topical Q0600  . enoxaparin (LOVENOX) injection  40 mg Subcutaneous Q24H  . mupirocin ointment  1 application Nasal BID  . nicotine  14 mg Transdermal Daily  . sodium chloride flush  3 mL Intravenous Q12H   Continuous Infusions: . sodium chloride 10 mL/hr at 12/20/20 2115  . vancomycin 750 mg (12/23/20 0322)     LOS: 3 days   Time spent: 27 minutes   RDarliss Cheney MD [facility]  12/23/2020, 12:27 PM   To contact the attending provider between 7A-7P or the covering provider during after hours 7P-7A, please log into the web site [email or web address]

## 2020-12-23 NOTE — Progress Notes (Addendum)
Pharmacy Antibiotic Note  [name] is a 29 y.o. female admitted on 12/19/2020 with recurrent skin infections found to have MRSA bacteremia. 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  Pharmacy has been consulted for vancomycin dosing. Patient has been receiving vancomycin 750mg  IV q12h. Vancomycin trough obtained incorrectly this morning during infusion and found to be 39. Vancomycin trough obtained this afternoon found to be 9 which is subtherapeutic - goal 15-20. Scr 0.96 - baseline ~0.7-0.8. No UOP documented in [facility] - x1 yesterday.   Vancomycin 750mg  given at ~1630.   Plan: Increase 1250mg  q12h  Give 500mg  x1 tonight to give full dose of 1250mg  - RN aware Follow up repeat VT at steady state Monitor cultures, renal function, and clinical progression  Height: 5' (152.4 cm) Weight: 56.2 kg (124 lb) IBW/kg (Calculated) : 45.5  Temp (24hrs), Avg:98.1 F (36.7 C), Min:97.5 F (36.4 C), Max:99.1 F (37.3 C)  Recent Labs  Lab 12/19/20 1647 12/21/20 0104 12/22/20 0640 12/23/20 0349 12/23/20 1535  WBC 12.5* 10.3 6.2 5.5  --   CREATININE 0.90 1.06* 0.77 0.96  --   LATICACIDVEN 1.7  --   --   --   --   VANCOTROUGH  --   --   --  39* 9*    Estimated Creatinine Clearance: 67.4 mL/min (by C-G formula based on SCr of 0.96 mg/dL).    Allergies  Allergen Reactions  . Dextromethorphan Swelling    Facial swelling    Antimicrobials this admission: Vancomycin 12/25 >>   Dose adjustments this admission: 12/27: vancomycin 750mg  q12h changed to 1250mg  q12h  Microbiology results: 12/23 bcx: 1/4 MRSA 12/24 bcx:  2/2 MRSA 12/26 bcx: ngtd   Thank you for allowing pharmacy to be a part of this patient's care.  1/26, PharmD Clinical Pharmacist  12/23/2020 5:02 PM

## 2020-12-23 NOTE — Progress Notes (Signed)
° ° °  [facility] has been requested to perform a transesophageal echocardiogram on [name] for bacteremia.  After careful review of history and examination, the risks and benefits of transesophageal echocardiogram have been explained including risks of esophageal damage, perforation (1:10,000 risk), bleeding, pharyngeal hematoma as well as other potential complications associated with conscious sedation including aspiration, arrhythmia, respiratory failure and death. Alternatives to treatment were discussed, questions were answered. Patient is willing to proceed.   PT scheduled for TEE 12/23/20 at 11:30am with Dr. Mayford Knife. NPO at [place].  [name], [place]  12/23/2020 2:11 PM

## 2020-12-23 NOTE — Progress Notes (Signed)
CRITICAL VALUE STICKER  CRITICAL VALUE: Vancomycin Troph  RECEIVER (on-site recipient of call):Nurse  DATE & TIME NOTIFIED: 12/27 @ 0536  MESSENGER (representative from lab): Mindi Junker  MD NOTIFIED: Carren Rang  TIME OF NOTIFICATION:0552; Also noted that the value is not valid. Per pharmacy sample was not collected at the right time. Pharmacy will continue to monitor.  RESPONSE:   De Burrs, RN

## 2020-12-23 NOTE — Plan of Care (Signed)
  Problem: Clinical Measurements: Goal: Respiratory complications will improve Outcome: Progressing   

## 2020-12-24 ENCOUNTER — Encounter (HOSPITAL_COMMUNITY)
Admission: EM | Disposition: A | Discharge status: Home / Self Care | Payer: Self-pay | Source: Home / Self Care | Attending: Internal Medicine

## 2020-12-24 ENCOUNTER — Inpatient Hospital Stay (HOSPITAL_COMMUNITY): Payer: Medicaid Other | Admitting: Certified Registered"

## 2020-12-24 ENCOUNTER — Encounter (HOSPITAL_COMMUNITY): Payer: Self-pay | Admitting: Internal Medicine

## 2020-12-24 ENCOUNTER — Inpatient Hospital Stay (HOSPITAL_COMMUNITY): Payer: Medicaid Other

## 2020-12-24 DIAGNOSIS — I340 Nonrheumatic mitral (valve) insufficiency: Secondary | ICD-10-CM

## 2020-12-24 DIAGNOSIS — A419 Sepsis, unspecified organism: Secondary | ICD-10-CM | POA: Diagnosis not present

## 2020-12-24 DIAGNOSIS — R7881 Bacteremia: Secondary | ICD-10-CM

## 2020-12-24 HISTORY — PX: BUBBLE STUDY: SHX6837

## 2020-12-24 HISTORY — PX: TEE WITHOUT CARDIOVERSION: SHX5443

## 2020-12-24 LAB — CBC WITH DIFFERENTIAL/PLATELET
Abs Immature Granulocytes: 0.03 10*3/uL (ref 0.00–0.07)
Basophils Absolute: 0 10*3/uL (ref 0.0–0.1)
Basophils Relative: 0 %
Eosinophils Absolute: 0.2 10*3/uL (ref 0.0–0.5)
Eosinophils Relative: 4 %
HCT: 34.8 % — ABNORMAL LOW (ref 36.0–46.0)
Hemoglobin: 12 g/dL (ref 12.0–15.0)
Immature Granulocytes: 1 %
Lymphocytes Relative: 41 %
Lymphs Abs: 2.5 10*3/uL (ref 0.7–4.0)
MCH: 31.8 pg (ref 26.0–34.0)
MCHC: 34.5 g/dL (ref 30.0–36.0)
MCV: 92.3 fL (ref 80.0–100.0)
Monocytes Absolute: 0.6 10*3/uL (ref 0.1–1.0)
Monocytes Relative: 10 %
Neutro Abs: 2.6 10*3/uL (ref 1.7–7.7)
Neutrophils Relative %: 44 %
Platelets: 354 10*3/uL (ref 150–400)
RBC: 3.77 MIL/uL — ABNORMAL LOW (ref 3.87–5.11)
RDW: 12.9 % (ref 11.5–15.5)
WBC: 6 10*3/uL (ref 4.0–10.5)
nRBC: 0 % (ref 0.0–0.2)

## 2020-12-24 LAB — CULTURE, BLOOD (SINGLE): Special Requests: ADEQUATE

## 2020-12-24 LAB — FLUORESCENT TREPONEMAL AB(FTA)-IGG-BLD: Fluorescent Treponemal Ab, IgG: NONREACTIVE

## 2020-12-24 SURGERY — ECHOCARDIOGRAM, TRANSESOPHAGEAL
Anesthesia: Monitor Anesthesia Care

## 2020-12-24 MED ORDER — PROPOFOL 500 MG/50ML IV EMUL
INTRAVENOUS | Status: DC | PRN
  Administered 2020-12-24: 200 ug/kg/min via INTRAVENOUS

## 2020-12-24 MED ORDER — PHENYLEPHRINE HCL (PRESSORS) 10 MG/ML IV SOLN
INTRAVENOUS | Status: DC | PRN
  Administered 2020-12-24: 80 ug via INTRAVENOUS
  Administered 2020-12-24: 120 ug via INTRAVENOUS

## 2020-12-24 MED ORDER — LACTATED RINGERS IV SOLN: INTRAVENOUS | Status: DC | PRN

## 2020-12-24 MED ORDER — LIDOCAINE 2% (20 MG/ML) 5 ML SYRINGE
INTRAMUSCULAR | Status: DC | PRN
  Administered 2020-12-24: 100 mg via INTRAVENOUS

## 2020-12-24 NOTE — Anesthesia Procedure Notes (Signed)
Procedure Name: MAC Date/Time: 12/24/2020 11:30 AM Performed by: Amadeo Garnet, CRNA Pre-anesthesia Checklist: Patient identified, Emergency Drugs available, Suction available and Patient being monitored Patient Re-evaluated:Patient Re-evaluated prior to induction Oxygen Delivery Method: Nasal cannula Preoxygenation: Pre-oxygenation with 100% oxygen Induction Type: IV induction Placement Confirmation: positive ETCO2 Dental Injury: Teeth and Oropharynx as per pre-operative assessment

## 2020-12-24 NOTE — Progress Notes (Signed)
°  Echocardiogram Echocardiogram Transesophageal has been performed.  [name] 12/24/2020, 11:56 AM

## 2020-12-24 NOTE — Transfer of Care (Signed)
Immediate Anesthesia Transfer of Care Note  Patient: ASAKO SALIBA  Procedure(s) Performed: TRANSESOPHAGEAL ECHOCARDIOGRAM (TEE) (N/A ) BUBBLE STUDY  Patient Location: Endoscopy Unit  Anesthesia Type:MAC  Level of Consciousness: awake, alert  and oriented  Airway & Oxygen Therapy: Patient Spontanous Breathing  Post-op Assessment: Report given to RN, Post -op Vital signs reviewed and stable and Patient moving all extremities  Post vital signs: Reviewed and stable  Last Vitals:  Vitals Value Taken Time  BP 84/41 12/24/20 1155  Temp 37.3 C 12/24/20 1155  Pulse 98 12/24/20 1159  Resp 17 12/24/20 1159  SpO2 95 % 12/24/20 1159  Vitals shown include unvalidated device data.  Last Pain:  Vitals:   12/24/20 1155  TempSrc: Temporal  PainSc:          Complications: No complications documented.

## 2020-12-24 NOTE — Anesthesia Postprocedure Evaluation (Signed)
Anesthesia Post Note  Patient: [name]  Procedure(s) Performed: TRANSESOPHAGEAL ECHOCARDIOGRAM (TEE) (N/A ) BUBBLE STUDY     Patient location during evaluation: Endoscopy Anesthesia Type: MAC Level of consciousness: awake and alert Pain management: pain level controlled Vital Signs Assessment: post-procedure vital signs reviewed and stable Respiratory status: spontaneous breathing, nonlabored ventilation and respiratory function stable Cardiovascular status: blood pressure returned to baseline and stable Postop Assessment: no apparent nausea or vomiting Anesthetic complications: no   No complications documented.  Last Vitals:  Vitals:   12/24/20 1205 12/24/20 1215  BP: (!) 83/42 (!) 91/59  Pulse: 94 94  Resp: 16 14  Temp:    SpO2: 95% 99%    Last Pain:  Vitals:   12/24/20 1215  TempSrc:   PainSc: 0-No pain                 Lidia Collum

## 2020-12-24 NOTE — Interval H&P Note (Signed)
History and Physical Interval Note:  12/24/2020 10:54 AM  [name]  has presented today for surgery, with the diagnosis of HX OF ENDOCARDITIS.  The various methods of treatment have been discussed with the patient and family. After consideration of risks, benefits and other options for treatment, the patient has consented to  Procedure(s): TRANSESOPHAGEAL ECHOCARDIOGRAM (TEE) (N/A) as a surgical intervention.  The patient's history has been reviewed, patient examined, no change in status, stable for surgery.  I have reviewed the patient's chart and labs.  Questions were answered to the patient's satisfaction.     Armanda Magic

## 2020-12-24 NOTE — Plan of Care (Signed)
?  Problem: Clinical Measurements: ?Goal: Will remain free from infection ?Outcome: Progressing ?  ?

## 2020-12-24 NOTE — Anesthesia Preprocedure Evaluation (Signed)
Anesthesia Evaluation  Patient identified by MRN, date of birth, ID band Patient awake    Reviewed: Allergy & Precautions, NPO status , Patient's Chart, lab work & pertinent test results  History of Anesthesia Complications Negative for: history of anesthetic complications  Airway Mallampati: II  TM Distance: >3 FB Neck ROM: Full    Dental   Pulmonary Current Smoker and Patient abstained from smoking.,    Pulmonary exam normal        Cardiovascular Normal cardiovascular exam+ Valvular Problems/Murmurs (h/o endocarditis)      Neuro/Psych negative neurological ROS  negative psych ROS   GI/Hepatic negative GI ROS, (+)     substance abuse  cocaine use, marijuana use, methamphetamine use and IV drug use, Hepatitis -, C  Endo/Other  negative endocrine ROS  Renal/GU negative Renal ROS  negative genitourinary   Musculoskeletal negative musculoskeletal ROS (+) narcotic dependent  Abdominal   Peds  Hematology negative hematology ROS (+)   Anesthesia Other Findings  Admitted with sepsis and suspected endocarditis.  Echo 12/21/20: Normal biventricular function without evidence of hemodynamically significant valvular heart disease. No evidence of valvular vegetations on this transthoracic echocardiogram. Would recommend a transesophageal  echocardiogram to exclude infective endocarditis if clinically indicated.  Reproductive/Obstetrics                             Anesthesia Physical Anesthesia Plan  ASA: III  Anesthesia Plan: MAC   Post-op Pain Management:    Induction: Intravenous  PONV Risk Score and Plan: Propofol infusion, TIVA and Treatment may vary due to age or medical condition  Airway Management Planned: Natural Airway, Nasal Cannula and Simple Face Mask  Additional Equipment: None  Intra-op Plan:   Post-operative Plan:   Informed Consent: I have reviewed the patients History  and Physical, chart, labs and discussed the procedure including the risks, benefits and alternatives for the proposed anesthesia with the patient or authorized representative who has indicated his/her understanding and acceptance.       Plan Discussed with:   Anesthesia Plan Comments:         Anesthesia Quick Evaluation

## 2020-12-24 NOTE — Progress Notes (Signed)
PROGRESS NOTE    [name]  HCW:[id number] DOB: Jan 24, 1991 DOA: 12/19/2020 PCP: Merryl Hacker, No   Brief Narrative:  [name] is a 29 y.o. 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  She has had similar symptoms like this previously in the past.  Current episode started about 4 days ago.  Initially rash started out as little pimples that sometimes pop on their own or develop into abscesses.  Denied any significant fever, chills, chest pain, cough, shortness of breath, leg swelling, abdominal pain, or blood in stool.  Last admitted into the hospital in May of this year for septic shock with tricuspid valve endocarditis related to strep pyrogens bacteremia. she only stayed in hospital couple days prior to leaving [place] and never followed up with any provider. She claims to be clean from IV drugs for 120 days after getting out of jail on August 19.  Upon admission into the emergency department patient was hemodynamically stable other than mild tachycardia.  Urine drug screen was positive for amphetamines however she claims that she takes Adderall which is prescribed.  Chest x-ray showed no acute abnormalities.  ID consulted and recommended checking 3 sets of blood cultures starting cefazolin.  Blood culture growing MRSA and staph epidermidis. Repeat blood culture from 12/22/2020 - thus far.  TEE negative for any vegetations.   Assessment & Plan:   Principal Problem:   Sepsis ([place]) Active Problems:   Hepatitis C, chronic, maternal, antepartum ([place])   Cigarette smoker   Polysubstance dependence including opioid type drug without complication, episodic abuse ([place])   Endocarditis   Rash   Bacteremia   Sepsis secondary to possible endocarditis:  Patient met sepsis criteria due to tachycardic with white blood cell count elevated up to 12.5 meeting SIRS criteria. Previously positive for group A strep (strep pyogenes) when admitted back in May with tricuspid valve endocarditis, but left AMA prior to receiving full recommended treatment. CRP was elevated at 6.3 and sedimentation rate was 40. ID is on board.  Her repeat transthoracic echo does not show any endocarditis, Blood culture growing MRSA and staph epidermidis. Repeat blood culture from 12/22/2020 - thus far.  Status post TEE today which does not show any endocarditis.  Further management per ID.  No note from ID yesterday.  I have sent a message to ID today to see this patient.  Unknown skin lesions, possibly carbuncles:. Patient presents with complaints of multiple skin ulcerations mostly concentrated on the face and neck.  Seem to be carbuncles.  ID pursuing further work-up for syphilis and others.  Asymptomatic bacteriuria: Does not need antibiotics for this.  Hyponatremia: Resolved.  Elevated transaminases  history of chronic hepatitis C: Acute.  LFTs are stable.  Polysubstance abuse: Patient reports being clean from IV drug use for the last 120 days. Urine drug screen was positive for amphetamines however she tells me that she takes Adderall. Prior history of abuse also includes heroin, cocaine, and marijuana. -Continue to monitor for withdrawal symptoms.  Ativan IV if needed -Continue counseling on need of cessation of drug use  Mild left preseptal cellulitis: ID was concerned about possible osteomyelitis.  Patient has no visual symptoms or deep pain in the left eye.  Per ID recommendation, ophthalmology was consulted and she was ruled out of the mellitus and they opined  that she likely has preseptal cellulitis for which she is already on antibiotics.  Patient continues to improve every day.  No problem with vision.  Mild AKI: Secondary to sepsis.  Resolved.    DVT  prophylaxis: enoxaparin (LOVENOX) injection 40 mg Start: 12/20/20 1700   Code Status: Full Code  Family Communication: None present at bedside.  Plan of care discussed with patient in length.  Status is: Inpatient  Remains inpatient appropriate because:Inpatient level of care appropriate due to severity of illness   Dispo: The patient is from: Home              Anticipated d/c is to: Home              Anticipated d/c date is: > 3 days              Patient currently is not medically stable to d/c.        Estimated body mass index is 24.22 kg/m as calculated from the following:   Height as of this encounter: 5' (1.524 m).   Weight as of this encounter: 56.2 kg.      Nutritional status:               Consultants:   ID  Ophthalmology  Procedures:   TEE  Antimicrobials:  Anti-infectives (From admission, onward)   Start     Dose/Rate Route Frequency Ordered Stop   12/24/20 0600  vancomycin (VANCOREADY) IVPB 1250 mg/250 mL        1,250 mg 166.7 mL/hr over 90 Minutes Intravenous Every 12 hours 12/23/20 1723     12/23/20 1800  vancomycin (VANCOREADY) IVPB 500 mg/100 mL        500 mg 100 mL/hr over 60 Minutes Intravenous  Once 12/23/20 1724 12/23/20 1851   12/21/20 1600  vancomycin (VANCOREADY) IVPB 750 mg/150 mL  Status:  Discontinued        750 mg 150 mL/hr over 60 Minutes Intravenous Every 12 hours 12/21/20 0249 12/23/20 1723   12/21/20 0300  vancomycin (VANCOCIN) IVPB 1000 mg/200 mL premix        1,000 mg 200 mL/hr over 60 Minutes Intravenous STAT 12/21/20 0249 12/21/20 0711   12/20/20 0600  ceFAZolin (ANCEF) IVPB 1 g/50 mL premix  Status:  Discontinued        1 g 100 mL/hr over 30 Minutes Intravenous Every 8 hours 12/19/20 2254 12/21/20 0242         Subjective: Patient seen and examined.  She has no complaints.  She states that she continues to improve.  Objective: Vitals:   12/24/20 1155 12/24/20 1205 12/24/20 1215 12/24/20 1237  BP: (!)  84/41 (!) 83/42 (!) 91/59 101/68  Pulse: 96 94 94 87  Resp: '16 16 14 16  ' Temp: 99.1 F (37.3 C)   97.8 F (36.6 C)  TempSrc: Temporal   Oral  SpO2: 96% 95% 99% 100%  Weight:      Height:        Intake/Output Summary (Last 24 hours) at 12/24/2020 1342 Last data filed at 12/24/2020 1156 Gross per 24 hour  Intake 1292.37 ml  Output --  Net 1292.37 ml   Filed Weights   12/19/20 1634  Weight: 56.2 kg    Examination:  General exam: Appears calm and comfortable  Respiratory system: Clear to auscultation. Respiratory effort normal. Cardiovascular system: S1 & S2 heard, RRR. No JVD, murmurs, rubs, gallops or clicks. No pedal edema. Gastrointestinal system: Abdomen is nondistended, soft and  nontender. No organomegaly or masses felt. Normal bowel sounds heard. Central nervous system: Alert and oriented. No focal neurological deficits. Extremities: Symmetric 5 x 5 power. Skin: Multiple skin lesions/carbuncles mostly on the face but also sparingly involving all 4 extremities. Psychiatry: Judgement and insight appear normal. Mood & affect appropriate.   Data Reviewed: I have personally reviewed following labs and imaging studies  CBC: Recent Labs  Lab 12/19/20 1647 12/21/20 0104 12/22/20 0640 12/23/20 0349 12/24/20 0251  WBC 12.5* 10.3 6.2 5.5 6.0  NEUTROABS 8.4*  --  2.4 1.8 2.6  HGB 12.7 11.6* 10.7* 11.1* 12.0  HCT 38.0 35.0* 32.9* 33.8* 34.8*  MCV 94.3 94.9 94.5 93.9 92.3  PLT 344 315 301 295 103   Basic Metabolic Panel: Recent Labs  Lab 12/19/20 1647 12/21/20 0104 12/22/20 0640 12/23/20 0349  NA 133* 135 135 134*  K 3.9 4.3 4.2 4.0  CL 96* 98 104 100  CO2 26 25 20* 23  GLUCOSE 115* 93 98 132*  BUN '12 16 10 9  ' CREATININE 0.90 1.06* 0.77 0.96  CALCIUM 8.9 8.8* 8.4* 8.8*  MG  --   --   --  1.9   GFR: Estimated Creatinine Clearance: 67.4 mL/min (by C-G formula based on SCr of 0.96 mg/dL). Liver Function Tests: Recent Labs  Lab 12/19/20 1647 12/21/20 0104  12/22/20 0640 12/23/20 0349  AST 43* 48* 37 45*  ALT 59* 47* 43 50*  ALKPHOS 67 61 53 57  BILITOT 0.7 1.0 0.4 0.4  PROT 7.5 6.6 6.1* 6.4*  ALBUMIN 3.9 3.3* 3.0* 3.0*   No results for input(s): LIPASE, AMYLASE in the last 168 hours. No results for input(s): AMMONIA in the last 168 hours. Coagulation Profile: No results for input(s): INR, PROTIME in the last 168 hours. Cardiac Enzymes: No results for input(s): CKTOTAL, CKMB, CKMBINDEX, TROPONINI in the last 168 hours. BNP (last 3 results) No results for input(s): PROBNP in the last 8760 hours. HbA1C: No results for input(s): HGBA1C in the last 72 hours. CBG: No results for input(s): GLUCAP in the last 168 hours. Lipid Profile: No results for input(s): CHOL, HDL, LDLCALC, TRIG, CHOLHDL, LDLDIRECT in the last 72 hours. Thyroid Function Tests: No results for input(s): TSH, T4TOTAL, FREET4, T3FREE, THYROIDAB in the last 72 hours. Anemia Panel: No results for input(s): VITAMINB12, FOLATE, FERRITIN, TIBC, IRON, RETICCTPCT in the last 72 hours. Sepsis Labs: Recent Labs  Lab 12/19/20 1647  LATICACIDVEN 1.7    Recent Results (from the past 240 hour(s))  Blood culture (routine x 2)     Status: None (Preliminary result)   Collection Time: 12/19/20 11:00 PM   Specimen: Left Antecubital; Blood  Result Value Ref Range Status   Specimen Description   Final    LEFT ANTECUBITAL Performed at [facility], [address] [place], [place] [address]    Special Requests   Final    BOTTLES DRAWN AEROBIC AND ANAEROBIC Blood Culture adequate volume Performed at [facility], [address] [place], [place] [address]    Culture   Final    NO GROWTH 3 DAYS Performed at [facility], [place] [address] [place], [place] [address]    Report Status PENDING  Incomplete  Blood culture (routine x 2)     Status: Abnormal (Preliminary result)   Collection Time: 12/19/20 11:09 PM   Specimen: BLOOD RIGHT FOREARM  Result  Value Ref Range Status   Specimen Description   Final  BLOOD RIGHT FOREARM Performed at [facility], [address] [place], [place] [address]    Special Requests   Final    BOTTLES DRAWN AEROBIC AND ANAEROBIC Blood Culture results may not be optimal due to an excessive volume of blood received in culture bottles Performed at [facility], [address] [place], [place] [address]    Culture  Setup Time   Final    GRAM POSITIVE COCCI AEROBIC BOTTLE ONLY CRITICAL VALUE NOTED.  VALUE IS CONSISTENT WITH PREVIOUSLY REPORTED AND CALLED VALUE.    Culture (A)  Final    STAPHYLOCOCCUS AUREUS SUSCEPTIBILITIES PERFORMED ON PREVIOUS CULTURE WITHIN THE LAST 5 DAYS. Performed at [facility], [place] [address], [place], [place] [address]    Report Status PENDING  Incomplete  Resp Panel by RT-PCR (Flu A&B, Covid) Nasopharyngeal Swab     Status: None   Collection Time: 12/19/20 11:54 PM   Specimen: Nasopharyngeal Swab; Nasopharyngeal(NP) swabs in vial transport medium  Result Value Ref Range Status   SARS Coronavirus 2 by RT PCR NEGATIVE NEGATIVE Final    Comment: (NOTE) SARS-CoV-2 target nucleic acids are NOT DETECTED.  The SARS-CoV-2 RNA is generally detectable in upper respiratory specimens during the acute phase of infection. The lowest concentration of SARS-CoV-2 viral copies this assay can detect is 138 copies/mL. A negative result does not preclude SARS-Cov-2 infection and should not be used as the sole basis for treatment or other patient management decisions. A negative result may occur with  improper specimen collection/handling, submission of specimen other than nasopharyngeal swab, presence of viral mutation(s) within the areas targeted by this assay, and inadequate number of viral copies(<138 copies/mL). A negative result must be combined with clinical observations, patient history, and epidemiological information. The expected result is  Negative.  Fact Sheet for Patients:  EntrepreneurPulse.com.au  Fact Sheet for Healthcare Providers:  IncredibleEmployment.be  This test is no t yet approved or cleared by the [place] FDA and  has been authorized for detection and/or diagnosis of SARS-CoV-2 by FDA under an Emergency Use Authorization (EUA). This EUA will remain  in effect (meaning this test can be used) for the duration of the COVID-19 declaration under Section 564(b)(1) of the Act, 21 U.S.C.section 360bbb-3(b)(1), unless the authorization is terminated  or revoked sooner.       Influenza A by PCR NEGATIVE NEGATIVE Final   Influenza B by PCR NEGATIVE NEGATIVE Final    Comment: (NOTE) The Xpert Xpress SARS-CoV-2/FLU/RSV plus assay is intended as an aid in the diagnosis of influenza from Nasopharyngeal swab specimens and should not be used as a sole basis for treatment. Nasal washings and aspirates are unacceptable for Xpert Xpress SARS-CoV-2/FLU/RSV testing.  Fact Sheet for Patients: EntrepreneurPulse.com.au  Fact Sheet for Healthcare Providers: IncredibleEmployment.be  This test is not yet approved or cleared by the [place] FDA and has been authorized for detection and/or diagnosis of SARS-CoV-2 by FDA under an Emergency Use Authorization (EUA). This EUA will remain in effect (meaning this test can be used) for the duration of the COVID-19 declaration under Section 564(b)(1) of the Act, 21 U.S.C. section 360bbb-3(b)(1), unless the authorization is terminated or revoked.  Performed at [facility], [address] [place], [place] [address]   Culture, blood (single)     Status: Abnormal   Collection Time: 12/20/20  4:53 AM   Specimen: Left Antecubital; Blood  Result Value Ref Range Status   Specimen Description  Final    LEFT ANTECUBITAL Performed at [facility], [address] [place],  [place] [address]    Special Requests   Final    BOTTLES DRAWN AEROBIC AND ANAEROBIC Blood Culture adequate volume Performed at [facility], [address] [place], [place] [address]    Culture  Setup Time   Final    IN BOTH AEROBIC AND ANAEROBIC BOTTLES GRAM POSITIVE COCCI Organism ID to follow CRITICAL RESULT CALLED TO, READ BACK BY AND VERIFIED WITH: C AMEND [facility] 12/21/20 0228 JDW Performed at [facility], [place] [address] [place], [place] [address]    Culture (A)  Final    METHICILLIN RESISTANT STAPHYLOCOCCUS AUREUS NONVIABLE  STAPHYLOCOCCUS EPIDERMIDIS    Report Status 12/24/2020 FINAL  Final   Organism ID, Bacteria METHICILLIN RESISTANT STAPHYLOCOCCUS AUREUS  Final      Susceptibility   Methicillin resistant staphylococcus aureus - MIC*    CIPROFLOXACIN >=8 RESISTANT Resistant     ERYTHROMYCIN >=8 RESISTANT Resistant     GENTAMICIN <=0.5 SENSITIVE Sensitive     OXACILLIN >=4 RESISTANT Resistant     TETRACYCLINE <=1 SENSITIVE Sensitive     VANCOMYCIN 1 SENSITIVE Sensitive     TRIMETH/SULFA >=320 RESISTANT Resistant     CLINDAMYCIN >=8 RESISTANT Resistant     RIFAMPIN <=0.5 SENSITIVE Sensitive     Inducible Clindamycin NEGATIVE Sensitive     * METHICILLIN RESISTANT STAPHYLOCOCCUS AUREUS  Blood Culture ID Panel (Reflexed)     Status: Abnormal   Collection Time: 12/20/20  4:53 AM  Result Value Ref Range Status   Enterococcus faecalis NOT DETECTED NOT DETECTED Final   Enterococcus Faecium NOT DETECTED NOT DETECTED Final   Listeria monocytogenes NOT DETECTED NOT DETECTED Final   Staphylococcus species DETECTED (A) NOT DETECTED Final    Comment: CRITICAL RESULT CALLED TO, READ BACK BY AND VERIFIED WITH: C AMEND PHARMD 12/21/20 0228 JDW    Staphylococcus aureus (BCID) DETECTED (A) NOT DETECTED Final    Comment: Methicillin (oxacillin)-resistant Staphylococcus aureus (MRSA). MRSA is predictably resistant to beta-lactam antibiotics (except ceftaroline). Preferred  therapy is vancomycin unless clinically contraindicated. Patient requires contact precautions if  hospitalized. CRITICAL RESULT CALLED TO, READ BACK BY AND VERIFIED WITH: C AMEND PHARMD 12/21/20 0228 JDW    Staphylococcus epidermidis DETECTED (A) NOT DETECTED Final    Comment: CRITICAL RESULT CALLED TO, READ BACK BY AND VERIFIED WITH: C AMEND PHARMD 12/21/20 0228 JDW    Staphylococcus lugdunensis NOT DETECTED NOT DETECTED Final   Streptococcus species NOT DETECTED NOT DETECTED Final   Streptococcus agalactiae NOT DETECTED NOT DETECTED Final   Streptococcus pneumoniae NOT DETECTED NOT DETECTED Final   Streptococcus pyogenes NOT DETECTED NOT DETECTED Final   A.calcoaceticus-baumannii NOT DETECTED NOT DETECTED Final   Bacteroides fragilis NOT DETECTED NOT DETECTED Final   Enterobacterales NOT DETECTED NOT DETECTED Final   Enterobacter cloacae complex NOT DETECTED NOT DETECTED Final   Escherichia coli NOT DETECTED NOT DETECTED Final   Klebsiella aerogenes NOT DETECTED NOT DETECTED Final   Klebsiella oxytoca NOT DETECTED NOT DETECTED Final   Klebsiella pneumoniae NOT DETECTED NOT DETECTED Final   Proteus species NOT DETECTED NOT DETECTED Final   Salmonella species NOT DETECTED NOT DETECTED Final   Serratia marcescens NOT DETECTED NOT DETECTED Final   Haemophilus influenzae NOT DETECTED NOT DETECTED Final   Neisseria meningitidis NOT DETECTED NOT DETECTED Final   Pseudomonas aeruginosa NOT DETECTED NOT DETECTED Final   Stenotrophomonas maltophilia NOT DETECTED NOT  DETECTED Final   Candida albicans NOT DETECTED NOT DETECTED Final   Candida auris NOT DETECTED NOT DETECTED Final   Candida glabrata NOT DETECTED NOT DETECTED Final   Candida krusei NOT DETECTED NOT DETECTED Final   Candida parapsilosis NOT DETECTED NOT DETECTED Final   Candida tropicalis NOT DETECTED NOT DETECTED Final   Cryptococcus neoformans/gattii NOT DETECTED NOT DETECTED Final   Methicillin resistance mecA/C DETECTED  (A) NOT DETECTED Final    Comment: CRITICAL RESULT CALLED TO, READ BACK BY AND VERIFIED WITH: C AMEND PHARMD 12/21/20 0228 JDW    Meth resistant mecA/C and MREJ DETECTED (A) NOT DETECTED Final    Comment: CRITICAL RESULT CALLED TO, READ BACK BY AND VERIFIED WITH: C AMEND [facility] 12/21/20 0228 JDW Performed at [facility], [address] [place], [place] [address]   MRSA PCR Screening     Status: Abnormal   Collection Time: 12/20/20  6:15 PM   Specimen: Nasal Mucosa; Nasopharyngeal  Result Value Ref Range Status   MRSA by PCR POSITIVE (A) NEGATIVE Final    Comment:        The GeneXpert MRSA Assay (FDA approved for NASAL specimens only), is one component of a comprehensive MRSA colonization surveillance program. It is not intended to diagnose MRSA infection nor to guide or monitor treatment for MRSA infections. RESULT CALLED TO, READ BACK BY AND VERIFIED WITH: RN T. TIE [id number] 2010 FCP Performed at [facility] [address], [place], [place] [address]   Culture, blood (routine x 2)     Status: None (Preliminary result)   Collection Time: 12/22/20  6:46 AM   Specimen: BLOOD RIGHT WRIST  Result Value Ref Range Status   Specimen Description BLOOD RIGHT WRIST  Final   Special Requests   Final    BOTTLES DRAWN AEROBIC ONLY Blood Culture adequate volume   Culture   Final    NO GROWTH < 24 HOURS Performed at [facility], [place] [address], [place], [place] [address]    Report Status PENDING  Incomplete  Culture, blood (Routine X 2) w Reflex to ID Panel     Status: None (Preliminary result)   Collection Time: 12/22/20  9:11 AM   Specimen: BLOOD  Result Value Ref Range Status   Specimen Description BLOOD BLOOD LEFT WRIST  Final   Special Requests   Final    BOTTLES DRAWN AEROBIC ONLY Blood Culture adequate volume   Culture   Final    NO GROWTH < 24 HOURS Performed at [facility], [place] [address], [place], [place] [address]    Report Status PENDING   Incomplete      Radiology Studies: No results found.  Scheduled Meds: . Chlorhexidine Gluconate Cloth  6 each Topical Q0600  . enoxaparin (LOVENOX) injection  40 mg Subcutaneous Q24H  . mupirocin ointment  1 application Nasal BID  . nicotine  14 mg Transdermal Daily  . sodium chloride flush  3 mL Intravenous Q12H   Continuous Infusions: . sodium chloride 10 mL/hr at 12/20/20 2115  . vancomycin 1,250 mg (12/24/20 0511)     LOS: 4 days   Time spent: 25 minutes   Darliss Cheney, MD [facility]  12/24/2020, 1:42 PM   To contact the attending provider between 7A-7P or the covering provider during after hours 7P-7A, please log into the web site [email or web address].

## 2020-12-24 NOTE — CV Procedure (Signed)
    PROCEDURE NOTE:  Procedure:  Transesophageal echocardiogram Operator:  Armanda Magic, MD Indications:  Bacteremia Complications: None  During this procedure the patient is administered a total of Propofol 270 mg to achieve and maintain moderate conscious sedation.  The patient's heart rate, blood pressure, and oxygen saturation are monitored continuously during the procedure by anesthesia.    Results: Normal LV size and function Normal RV size and function Normal RA Normal LA and LA appendage.  No thrombus Normal TV with no evidence of vegetation Normal PV Normal MV with mild MR Normal trileaflet AV Normal interatrial septum with no evidence of shunt by colorflow dopper and agitated saline contrast injection.  Normal thoracic and ascending aorta.  The patient tolerated the procedure well and was transferred back to their room in stable condition.  Signed: Armanda Magic, MD [facility]

## 2020-12-25 ENCOUNTER — Encounter (HOSPITAL_COMMUNITY): Payer: Self-pay | Admitting: Cardiology

## 2020-12-25 DIAGNOSIS — R7881 Bacteremia: Secondary | ICD-10-CM | POA: Diagnosis not present

## 2020-12-25 DIAGNOSIS — B3321 Viral endocarditis: Secondary | ICD-10-CM

## 2020-12-25 DIAGNOSIS — F1920 Other psychoactive substance dependence, uncomplicated: Secondary | ICD-10-CM | POA: Diagnosis not present

## 2020-12-25 LAB — CULTURE, BLOOD (ROUTINE X 2)
Culture: NO GROWTH
Special Requests: ADEQUATE

## 2020-12-25 LAB — CBC WITH DIFFERENTIAL/PLATELET
Abs Immature Granulocytes: 0.02 10*3/uL (ref 0.00–0.07)
Basophils Absolute: 0 10*3/uL (ref 0.0–0.1)
Basophils Relative: 0 %
Eosinophils Absolute: 0.2 10*3/uL (ref 0.0–0.5)
Eosinophils Relative: 3 %
HCT: 36.8 % (ref 36.0–46.0)
Hemoglobin: 11.8 g/dL — ABNORMAL LOW (ref 12.0–15.0)
Immature Granulocytes: 0 %
Lymphocytes Relative: 41 %
Lymphs Abs: 2.7 10*3/uL (ref 0.7–4.0)
MCH: 30.7 pg (ref 26.0–34.0)
MCHC: 32.1 g/dL (ref 30.0–36.0)
MCV: 95.8 fL (ref 80.0–100.0)
Monocytes Absolute: 0.7 10*3/uL (ref 0.1–1.0)
Monocytes Relative: 10 %
Neutro Abs: 3 10*3/uL (ref 1.7–7.7)
Neutrophils Relative %: 46 %
Platelets: 357 10*3/uL (ref 150–400)
RBC: 3.84 MIL/uL — ABNORMAL LOW (ref 3.87–5.11)
RDW: 12.6 % (ref 11.5–15.5)
WBC: 6.7 10*3/uL (ref 4.0–10.5)
nRBC: 0 % (ref 0.0–0.2)

## 2020-12-25 LAB — BASIC METABOLIC PANEL
Anion gap: 8 (ref 5–15)
BUN: 9 mg/dL (ref 6–20)
CO2: 25 mmol/L (ref 22–32)
Calcium: 8.9 mg/dL (ref 8.9–10.3)
Chloride: 104 mmol/L (ref 98–111)
Creatinine, Ser: 0.79 mg/dL (ref 0.44–1.00)
GFR, Estimated: >60 mL/min (ref >60)
Glucose, Bld: 98 mg/dL (ref 70–99)
Potassium: 4.2 mmol/L (ref 3.5–5.1)
Sodium: 137 mmol/L (ref 135–145)

## 2020-12-25 LAB — HCV RNA QUANT RFLX ULTRA OR GENOTYP

## 2020-12-25 MED ORDER — CALCIUM CARBONATE ANTACID 500 MG PO CHEW
400.0000 mg | CHEWABLE_TABLET | Freq: Three times a day (TID) | ORAL | Status: DC | PRN
  Administered 2020-12-25: 400 mg via ORAL
  Filled 2020-12-25: qty 2

## 2020-12-25 NOTE — Progress Notes (Addendum)
[facility] Infectious Diseases Follow Up Note  Patient Identification: Patient Name: [name] MRN: [id number] Admit Date: 12/19/2020  8:45 PM Age: 29 y.o.Today's Date: 12/25/2020   Reason for Visit: MRSA bacteremia  Principal Problem:   Sepsis (HCC) Active Problems:   Hepatitis C, chronic, maternal, antepartum (HCC)   Cigarette smoker   Polysubstance dependence including opioid type drug without complication, episodic abuse (HCC)   Endocarditis   Rash   Bacteremia   Antibiotics: Vancomycin Day 6, total days of abx 7  Interval Events:continues to remain afebrile, no leukocytosis, hemodynamically stable. 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 I will treat this as a complicated MRSA bacteremia given her previous h/o untreated TV endocarditis. Duration would be 4 weeks from date of negative blood cx on 12/26. End date is 01/19/21.  She agreed to stay inpatient while she completes her antibiotics to me. In case, she decides to leave then, let us know and we can arrange for oral abx like linezolid/dalbavancin etc  PICC line, Blood cx 12/26 are NG in 3 days  Monitor CBC , BMP and Vancx trough while on IV abx  Will fu HCV RNA peripherally for need for treatment of HCV OP and arrange for f/u. Otherwise will sign off for now.   Rest of the management as per the primary team. Thank you for the consult.   ______________________________________________________________________ Subjective patient seen and examined at the bedside. She is sitting up in bed. In room air. No complaints. No issues with IV  abx  Vitals BP 99/66 (BP Location: Left Arm)   Pulse 83   Temp 98 F (36.7 C) (Oral)   Resp 20   Ht 5' (1.524 m)   Wt 56.2 kg   LMP 11/27/2020   SpO2 97%   BMI 24.22 kg/m   Not in acute distress, room air, able to speak full sentences Eyes- minimal congestion+ Folliculitic lesions in the face and legs which appear healing Chest- clear bilaterally CVS- Normal s1s2, RRR Abdomen -soft No spine tenderness, peripheral joint tenderness or swelling  Pertinent Microbiology Results for orders placed or performed during the hospital encounter of 12/19/20  Blood culture (routine x 2)     Status: None   Collection Time: 12/19/20 11:00 PM   Specimen: Left Antecubital; Blood  Result Value Ref Range Status   Specimen Description   Final    LEFT ANTECUBITAL Performed at [facility], [address] [place], [place] [address]    Special Requests   Final    BOTTLES DRAWN AEROBIC AND ANAEROBIC Blood Culture adequate volume Performed at [facility], [address] [place], [place] [address]    Culture   Final    NO GROWTH 5 DAYS Performed at [facility], [address], [place], [place] [address]    Report Status 12/25/2020 FINAL  Final  Blood culture (routine x 2)     Status: Abnormal   Collection Time: 12/19/20 11:09 PM   Specimen: BLOOD RIGHT FOREARM  Result Value Ref Range Status   Specimen Description   Final    BLOOD RIGHT FOREARM Performed at [facility], [address] [place], [place] [address]    Special Requests   Final    BOTTLES DRAWN AEROBIC AND ANAEROBIC Blood Culture results may not be optimal due to an excessive volume of blood received in culture bottles Performed at [facility], [address] [place], [place] [address]    Culture  Setup Time   Final    GRAM POSITIVE COCCI AEROBIC BOTTLE ONLY CRITICAL VALUE NOTED.  VALUE IS CONSISTENT WITH PREVIOUSLY REPORTED AND CALLED VALUE.    Culture (A)  Final     STAPHYLOCOCCUS AUREUS SUSCEPTIBILITIES PERFORMED ON PREVIOUS CULTURE WITHIN THE LAST 5 DAYS. Performed at [facility], [address] [place], [place] [address]    Report Status 12/25/2020 FINAL  Final  Resp Panel by RT-PCR (Flu A&B, Covid) Nasopharyngeal Swab     Status: None   Collection Time: 12/19/20 11:54 PM   Specimen: Nasopharyngeal Swab; Nasopharyngeal(NP) swabs in vial transport medium  Result Value Ref Range Status   SARS Coronavirus 2 by RT PCR NEGATIVE NEGATIVE Final    Comment: (NOTE) SARS-CoV-2 target nucleic acids are NOT DETECTED.  The SARS-CoV-2 RNA is generally detectable in upper respiratory specimens during the acute phase of infection. The lowest concentration of SARS-CoV-2 viral copies this assay can detect is 138 copies/mL. A negative result does not preclude SARS-Cov-2 infection and should not be used as the sole basis for treatment or other patient management decisions. A negative result may occur with  improper specimen collection/handling, submission of specimen other than nasopharyngeal swab, presence of viral mutation(s) within the areas targeted by this assay, and inadequate number of viral copies(<138 copies/mL). A negative result must be combined with clinical observations, patient history, and epidemiological information. The expected result is Negative.  Fact Sheet for Patients:  BloggerCourse.com  Fact Sheet for Healthcare Providers:  SeriousBroker.it  This test is no t yet approved or cleared by the [place] FDA and  has been authorized for detection and/or diagnosis of SARS-CoV-2 by FDA under an Emergency Use Authorization (EUA). This EUA will remain  in effect (meaning this test can be used) for the duration of the COVID-19 declaration under Section 564(b)(1) of the Act, 21 U.S.C.section 360bbb-3(b)(1), unless the authorization is terminated  or revoked sooner.       Influenza  A by PCR NEGATIVE NEGATIVE Final   Influenza B by PCR NEGATIVE NEGATIVE Final    Comment: (NOTE) The Xpert Xpress SARS-CoV-2/FLU/RSV plus assay is intended as an aid in the diagnosis of influenza from Nasopharyngeal swab specimens and should not be used as a sole basis for treatment. Nasal washings and aspirates are unacceptable for Xpert Xpress SARS-CoV-2/FLU/RSV testing.  Fact Sheet for Patients: BloggerCourse.com  Fact Sheet for Healthcare Providers: SeriousBroker.it  This test is not yet approved or cleared by the [place] FDA and has been authorized for detection and/or diagnosis of SARS-CoV-2 by FDA under an Emergency Use Authorization (EUA). This EUA will remain in effect (meaning this test can be used) for the duration of the COVID-19 declaration under Section 564(b)(1) of the Act, 21 U.S.C. section 360bbb-3(b)(1), unless the authorization is terminated or revoked.  Performed at [facility], [address] [place], [place] [address]   Culture, blood (single)     Status: Abnormal   Collection Time: 12/20/20  4:53 AM   Specimen: Left Antecubital; Blood  Result Value Ref Range Status   Specimen Description   Final    LEFT ANTECUBITAL Performed at  [facility], [address] [place], [place] [address]    Special Requests   Final    BOTTLES DRAWN AEROBIC AND ANAEROBIC Blood Culture adequate volume Performed at [facility], [address] [place], [place] [address]    Culture  Setup Time   Final    IN BOTH AEROBIC AND ANAEROBIC BOTTLES GRAM POSITIVE COCCI Organism ID to follow CRITICAL RESULT CALLED TO, READ BACK BY AND VERIFIED WITH: C AMEND [facility] 12/21/20 0228 JDW Performed at [facility], [address] [place], [place] [address]    Culture (A)  Final    METHICILLIN RESISTANT STAPHYLOCOCCUS AUREUS NONVIABLE  STAPHYLOCOCCUS EPIDERMIDIS    Report Status 12/24/2020  FINAL  Final   Organism ID, Bacteria METHICILLIN RESISTANT STAPHYLOCOCCUS AUREUS  Final      Susceptibility   Methicillin resistant staphylococcus aureus - MIC*    CIPROFLOXACIN >=8 RESISTANT Resistant     ERYTHROMYCIN >=8 RESISTANT Resistant     GENTAMICIN <=0.5 SENSITIVE Sensitive     OXACILLIN >=4 RESISTANT Resistant     TETRACYCLINE <=1 SENSITIVE Sensitive     VANCOMYCIN 1 SENSITIVE Sensitive     TRIMETH/SULFA >=320 RESISTANT Resistant     CLINDAMYCIN >=8 RESISTANT Resistant     RIFAMPIN <=0.5 SENSITIVE Sensitive     Inducible Clindamycin NEGATIVE Sensitive     * METHICILLIN RESISTANT STAPHYLOCOCCUS AUREUS  Blood Culture ID Panel (Reflexed)     Status: Abnormal   Collection Time: 12/20/20  4:53 AM  Result Value Ref Range Status   Enterococcus faecalis NOT DETECTED NOT DETECTED Final   Enterococcus Faecium NOT DETECTED NOT DETECTED Final   Listeria monocytogenes NOT DETECTED NOT DETECTED Final   Staphylococcus species DETECTED (A) NOT DETECTED Final    Comment: CRITICAL RESULT CALLED TO, READ BACK BY AND VERIFIED WITH: C AMEND PHARMD 12/21/20 0228 JDW    Staphylococcus aureus (BCID) DETECTED (A) NOT DETECTED Final    Comment: Methicillin (oxacillin)-resistant Staphylococcus aureus (MRSA). MRSA is predictably resistant to beta-lactam antibiotics (except ceftaroline). Preferred therapy is vancomycin unless clinically contraindicated. Patient requires contact precautions if  hospitalized. CRITICAL RESULT CALLED TO, READ BACK BY AND VERIFIED WITH: C AMEND PHARMD 12/21/20 0228 JDW    Staphylococcus epidermidis DETECTED (A) NOT DETECTED Final    Comment: CRITICAL RESULT CALLED TO, READ BACK BY AND VERIFIED WITH: C AMEND PHARMD 12/21/20 0228 JDW    Staphylococcus lugdunensis NOT DETECTED NOT DETECTED Final   Streptococcus species NOT DETECTED NOT DETECTED Final   Streptococcus agalactiae NOT DETECTED NOT DETECTED Final   Streptococcus pneumoniae NOT DETECTED NOT DETECTED Final    Streptococcus pyogenes NOT DETECTED NOT DETECTED Final   A.calcoaceticus-baumannii NOT DETECTED NOT DETECTED Final   Bacteroides fragilis NOT DETECTED NOT DETECTED Final   Enterobacterales NOT DETECTED NOT DETECTED Final   Enterobacter cloacae complex NOT DETECTED NOT DETECTED Final   Escherichia coli NOT DETECTED NOT DETECTED Final   Klebsiella aerogenes NOT DETECTED NOT DETECTED Final   Klebsiella oxytoca NOT DETECTED NOT DETECTED Final   Klebsiella pneumoniae NOT DETECTED NOT DETECTED Final   Proteus species NOT DETECTED NOT DETECTED Final   Salmonella species NOT DETECTED NOT DETECTED Final   Serratia marcescens NOT DETECTED NOT DETECTED Final   Haemophilus influenzae NOT DETECTED NOT DETECTED Final   Neisseria meningitidis NOT DETECTED NOT DETECTED Final   Pseudomonas aeruginosa NOT DETECTED NOT DETECTED Final   Stenotrophomonas maltophilia NOT DETECTED NOT DETECTED Final   Candida albicans NOT DETECTED  NOT DETECTED Final   Candida auris NOT DETECTED NOT DETECTED Final   Candida glabrata NOT DETECTED NOT DETECTED Final   Candida krusei NOT DETECTED NOT DETECTED Final   Candida parapsilosis NOT DETECTED NOT DETECTED Final   Candida tropicalis NOT DETECTED NOT DETECTED Final   Cryptococcus neoformans/gattii NOT DETECTED NOT DETECTED Final   Methicillin resistance mecA/C DETECTED (A) NOT DETECTED Final    Comment: CRITICAL RESULT CALLED TO, READ BACK BY AND VERIFIED WITH: C AMEND PHARMD 12/21/20 0228 JDW    Meth resistant mecA/C and MREJ DETECTED (A) NOT DETECTED Final    Comment: CRITICAL RESULT CALLED TO, READ BACK BY AND VERIFIED WITH: C AMEND [facility] 12/21/20 0228 JDW Performed at [facility], [address] [place], [place] [address]   MRSA PCR Screening     Status: Abnormal   Collection Time: 12/20/20  6:15 PM   Specimen: Nasal Mucosa; Nasopharyngeal  Result Value Ref Range Status   MRSA by PCR POSITIVE (A) NEGATIVE Final    Comment:        The GeneXpert MRSA  Assay (FDA approved for NASAL specimens only), is one component of a comprehensive MRSA colonization surveillance program. It is not intended to diagnose MRSA infection nor to guide or monitor treatment for MRSA infections. RESULT CALLED TO, READ BACK BY AND VERIFIED WITH: RN T. TIE [id number] FCP Performed at [facility], [address], [place], [place] [address]   Culture, blood (routine x 2)     Status: None (Preliminary result)   Collection Time: 12/22/20  6:46 AM   Specimen: BLOOD RIGHT WRIST  Result Value Ref Range Status   Specimen Description BLOOD RIGHT WRIST  Final   Special Requests   Final    BOTTLES DRAWN AEROBIC ONLY Blood Culture adequate volume   Culture   Final    NO GROWTH 3 DAYS Performed at [facility], [address], [place], [place] [address]    Report Status PENDING  Incomplete  Culture, blood (Routine X 2) w Reflex to ID Panel     Status: None (Preliminary result)   Collection Time: 12/22/20  9:11 AM   Specimen: BLOOD  Result Value Ref Range Status   Specimen Description BLOOD BLOOD LEFT WRIST  Final   Special Requests   Final    BOTTLES DRAWN AEROBIC ONLY Blood Culture adequate volume   Culture   Final    NO GROWTH 3 DAYS Performed at [facility], [address] [place], [place] [address]    Report Status PENDING  Incomplete   Pertinent Lab. CBC Latest Ref Rng & Units 12/25/2020 12/24/2020 12/23/2020  WBC 4.0 - 10.5 K/uL 6.7 6.0 5.5  Hemoglobin 12.0 - 15.0 g/dL 11.8(L) 12.0 11.1(L)  Hematocrit 36.0 - 46.0 % 36.8 34.8(L) 33.8(L)  Platelets 150 - 400 K/uL 357 354 295   CMP Latest Ref Rng & Units 12/25/2020 12/23/2020 12/22/2020  Glucose 70 - 99 mg/dL 98 989(Q) 98  BUN 6 - 20 mg/dL 9 9 10   Creatinine 0.44 - 1.00 mg/dL 1.19 4.17  Sodium 135 - 145 mmol/L 137 134(L) 135  Potassium 3.5 - 5.1 mmol/L 4.2 4.0 4.2  Chloride 98 - 111 mmol/L 104 100 104  CO2 22 - 32 mmol/L 25 23 20(L)  Calcium 8.9 - 10.3 mg/dL 8.9 4.08)  1.4(G)  Total Protein 6.5 - 8.1 g/dL - 6.4(L) 6.1(L)  Total Bilirubin 0.3 - 1.2 mg/dL - 0.4 0.4  Alkaline  Phos 38 - 126 U/L - 57 53  AST 15 - 41 U/L - 45(H) 37  ALT 0 - 44 U/L - 50(H) 43     Pertinent Imaging today Plain films and CT images have been personally visualized and interpreted; radiology reports have been reviewed. Decision making incorporated into the Impression / Recommendations.  I have spent approx 30 minutes for this patient encounter including review of prior medical records with greater than 50% of time being face to face and coordination of their care.  Electronically signed by:   Odette FractionSabina Jandi Swiger, MD Infectious Disease Physician [facility] Pager: [phone]

## 2020-12-25 NOTE — Progress Notes (Signed)
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 She has had similar symptoms like this previously in the past. Current episode started about 4 days ago. Initially rash started out as little pimples that sometimes pop on their own or develop into abscesses. Denied any significant fever, chills, chest pain, cough, shortness of breath, leg swelling, abdominal pain, or blood in stool. Last admitted into the hospital in May of this year for septic shock with tricuspid valve endocarditis related to strep pyrogens bacteremia. she only stayed in hospital couple days prior to leaving [place] and never followed up with any provider. She claims to be clean from IV drugs for 120 days after getting out of jail on August 19.  Upon admission into the emergency department patient was hemodynamically stable other than mild tachycardia.  Urine drug screen was positive for amphetamines however she claims that she takes Adderall which is prescribed. Chest x-ray showed no acute abnormalities. ID consulted and recommended checking 3 sets of blood cultures starting cefazolin.  Blood culture growing MRSA and staph epidermidis. Repeat blood culture from 12/22/2020 - thus far.  TEE negative for any vegetations.  Assessment & Plan:   Principal Problem:   Sepsis ([place]) Active Problems:   Hepatitis C, chronic, maternal, antepartum ([place])   Cigarette smoker   Polysubstance dependence including opioid type drug without complication, episodic abuse ([place])   Endocarditis   Rash   Bacteremia   Sepsis secondary to MRSA bacteremia without endocarditis: - Patient  met sepsis criteria due to tachycardic with white blood cell count elevated up to 12.5 meeting SIRS criteria. Previously positive for group A strep (strep pyogenes) when admitted back in May with tricuspid valve endocarditis, but left AMA prior to receiving full recommended treatment. CRP was elevated at 6.3 and sedimentation rate was 40. ID is on board.   -Her repeat transthoracic echo does not show any endocarditis, Blood culture growing MRSA and staph epidermidis. Repeat blood culture from 12/22/2020 - thus far.   -Status post TEE today which does not show any endocarditis.   -ID continuing to follow  Unknown skin lesions, possibly carbuncles:.  -Patient presents with complaints of multiple skin ulcerations mostly concentrated on the face and neck.   -ID reportedly pursuing further work-up for syphilis and others.  Asymptomatic bacteriuria:  -Cont to hold off on antibiotics at this time  Hyponatremia:  -Normalized  Elevated transaminases  history of chronic hepatitis C: Acute.   - LFTs had remained stable  Polysubstance abuse:  -Patient reportedly was clean from IV drug use for the last 120 days. Urine drug screen was positive for amphetamines however pt reports taking Adderall. Prior history of abuse also includes heroin, cocaine, and marijuana. -Continue to monitor for withdrawal symptoms.  Ativan IV if needed -Continue counseling on need of cessation of drug use  Mild left preseptal cellulitis: ID was concerned about possible osteomyelitis.  Patient has no visual symptoms or deep pain in the left eye.  Per ID recommendation, ophthalmology was consulted and pt is currently treated for preseptal cellulitis on current abx.  Patient continues to improve every day.  No problem with vision.  Mild AKI: Secondary to sepsis.  Resolved. Repeat bmet in  AM  DVT prophylaxis: Lovenox subq Code Status: Full Family Communication: Pt in room, family not at bedside  Status is:  Inpatient  Remains inpatient appropriate because:IV treatments appropriate due to intensity of illness or inability to take PO   Dispo: The patient is from: Home              Anticipated d/c is to: Home              Anticipated d/c date is: > 3 days              Patient currently is not medically stable to d/c.  Consultants:   ID  Cardiology  Procedures:   TEE, neg for vegetations  Antimicrobials: Anti-infectives (From admission, onward)   Start     Dose/Rate Route Frequency Ordered Stop   12/24/20 0600  vancomycin (VANCOREADY) IVPB 1250 mg/250 mL        1,250 mg 166.7 mL/hr over 90 Minutes Intravenous Every 12 hours 12/23/20 1723     12/23/20 1800  vancomycin (VANCOREADY) IVPB 500 mg/100 mL        500 mg 100 mL/hr over 60 Minutes Intravenous  Once 12/23/20 1724 12/23/20 1851   12/21/20 1600  vancomycin (VANCOREADY) IVPB 750 mg/150 mL  Status:  Discontinued        750 mg 150 mL/hr over 60 Minutes Intravenous Every 12 hours 12/21/20 0249 12/23/20 1723   12/21/20 0300  vancomycin (VANCOCIN) IVPB 1000 mg/200 mL premix        1,000 mg 200 mL/hr over 60 Minutes Intravenous STAT 12/21/20 0249 12/21/20 0711   12/20/20 0600  ceFAZolin (ANCEF) IVPB 1 g/50 mL premix  Status:  Discontinued        1 g 100 mL/hr over 30 Minutes Intravenous Every 8 hours 12/19/20 2254 12/21/20 0242       Subjective: Without complaints at this time  Objective: Vitals:   12/24/20 1900 12/25/20 0030 12/25/20 0447 12/25/20 0816  BP: (!) 88/54 99/63 104/65 99/66  Pulse: 87 91 86 83  Resp: '16 16 16 20  ' Temp: 98.2 F (36.8 C) 98 F (36.7 C) 98.1 F (36.7 C) 98 F (36.7 C)  TempSrc: Oral Oral Oral Oral  SpO2: 98% 99% 97% 97%  Weight:      Height:        Intake/Output Summary (Last 24 hours) at 12/25/2020 1551 Last data filed at 12/25/2020 0444 Gross per 24 hour  Intake 610.74 ml  Output --  Net 610.74 ml   Filed Weights   12/19/20 1634  Weight: 56.2 kg    Examination:  General  exam: Appears calm and comfortable  Respiratory system: Clear to auscultation. Respiratory effort normal. Cardiovascular system: S1 & S2 heard, Regular Gastrointestinal system: Abdomen is nondistended, soft and nontender. No organomegaly or masses felt. Normal bowel sounds heard. Central nervous system: Alert and oriented. No focal neurological deficits. Extremities: Symmetric 5 x 5 power. Skin: No rashes, lesions Psychiatry: Judgement and insight appear normal. Mood & affect appropriate.   Data Reviewed: I have personally reviewed following labs and imaging studies  CBC: Recent Labs  Lab 12/19/20 1647 12/21/20 0104 12/22/20 0640 12/23/20 0349 12/24/20 0251 12/25/20 0136  WBC 12.5* 10.3 6.2 5.5 6.0 6.7  NEUTROABS 8.4*  --  2.4 1.8 2.6 3.0  HGB 12.7 11.6* 10.7* 11.1* 12.0 11.8*  HCT 38.0 35.0* 32.9* 33.8* 34.8* 36.8  MCV 94.3 94.9 94.5 93.9 92.3 95.8  PLT 344 315 301 295 354 357  Basic Metabolic Panel: Recent Labs  Lab 12/19/20 1647 12/21/20 0104 12/22/20 0640 12/23/20 0349 12/25/20 0136  NA 133* 135 135 134* 137  K 3.9 4.3 4.2 4.0 4.2  CL 96* 98 104 100 104  CO2 26 25 20* 23 25  GLUCOSE 115* 93 98 132* 98  BUN '12 16 10 9 9  ' CREATININE 0.90 1.06* 0.77 0.96 0.79  CALCIUM 8.9 8.8* 8.4* 8.8* 8.9  MG  --   --   --  1.9  --    GFR: Estimated Creatinine Clearance: 80.8 mL/min (by C-G formula based on SCr of 0.79 mg/dL). Liver Function Tests: Recent Labs  Lab 12/19/20 1647 12/21/20 0104 12/22/20 0640 12/23/20 0349  AST 43* 48* 37 45*  ALT 59* 47* 43 50*  ALKPHOS 67 61 53 57  BILITOT 0.7 1.0 0.4 0.4  PROT 7.5 6.6 6.1* 6.4*  ALBUMIN 3.9 3.3* 3.0* 3.0*   No results for input(s): LIPASE, AMYLASE in the last 168 hours. No results for input(s): AMMONIA in the last 168 hours. Coagulation Profile: No results for input(s): INR, PROTIME in the last 168 hours. Cardiac Enzymes: No results for input(s): CKTOTAL, CKMB, CKMBINDEX, TROPONINI in the last 168 hours. BNP  (last 3 results) No results for input(s): PROBNP in the last 8760 hours. HbA1C: No results for input(s): HGBA1C in the last 72 hours. CBG: No results for input(s): GLUCAP in the last 168 hours. Lipid Profile: No results for input(s): CHOL, HDL, LDLCALC, TRIG, CHOLHDL, LDLDIRECT in the last 72 hours. Thyroid Function Tests: No results for input(s): TSH, T4TOTAL, FREET4, T3FREE, THYROIDAB in the last 72 hours. Anemia Panel: No results for input(s): VITAMINB12, FOLATE, FERRITIN, TIBC, IRON, RETICCTPCT in the last 72 hours. Sepsis Labs: Recent Labs  Lab 12/19/20 1647  LATICACIDVEN 1.7    Recent Results (from the past 240 hour(s))  Blood culture (routine x 2)     Status: None   Collection Time: 12/19/20 11:00 PM   Specimen: Left Antecubital; Blood  Result Value Ref Range Status   Specimen Description   Final    LEFT ANTECUBITAL Performed at [facility], [address] [place], [place] [address]    Special Requests   Final    BOTTLES DRAWN AEROBIC AND ANAEROBIC Blood Culture adequate volume Performed at [facility], [address], [place], [place] [address]    Culture   Final    NO GROWTH 5 DAYS Performed at [facility], [place] [address] [place], [place] [address]    Report Status 12/25/2020 FINAL  Final  Blood culture (routine x 2)     Status: Abnormal   Collection Time: 12/19/20 11:09 PM   Specimen: BLOOD RIGHT FOREARM  Result Value Ref Range Status   Specimen Description   Final    BLOOD RIGHT FOREARM Performed at [facility], [address] [place], [place] [address]    Special Requests   Final    BOTTLES DRAWN AEROBIC AND ANAEROBIC Blood Culture results may not be optimal due to an excessive volume of blood received in culture bottles Performed at [facility], [address] [place], [place] [address]    Culture  Setup Time   Final    GRAM POSITIVE COCCI AEROBIC BOTTLE ONLY CRITICAL VALUE NOTED.  VALUE IS  CONSISTENT WITH PREVIOUSLY REPORTED AND CALLED VALUE.    Culture (A)  Final    STAPHYLOCOCCUS AUREUS SUSCEPTIBILITIES PERFORMED ON PREVIOUS CULTURE WITHIN  THE LAST 5 DAYS. Performed at [facility], [place] [address], [place], [place] [address]    Report Status 12/25/2020 FINAL  Final  Resp Panel by RT-PCR (Flu A&B, Covid) Nasopharyngeal Swab     Status: None   Collection Time: 12/19/20 11:54 PM   Specimen: Nasopharyngeal Swab; Nasopharyngeal(NP) swabs in vial transport medium  Result Value Ref Range Status   SARS Coronavirus 2 by RT PCR NEGATIVE NEGATIVE Final    Comment: (NOTE) SARS-CoV-2 target nucleic acids are NOT DETECTED.  The SARS-CoV-2 RNA is generally detectable in upper respiratory specimens during the acute phase of infection. The lowest concentration of SARS-CoV-2 viral copies this assay can detect is 138 copies/mL. A negative result does not preclude SARS-Cov-2 infection and should not be used as the sole basis for treatment or other patient management decisions. A negative result may occur with  improper specimen collection/handling, submission of specimen other than nasopharyngeal swab, presence of viral mutation(s) within the areas targeted by this assay, and inadequate number of viral copies(<138 copies/mL). A negative result must be combined with clinical observations, patient history, and epidemiological information. The expected result is Negative.  Fact Sheet for Patients:  EntrepreneurPulse.com.au  Fact Sheet for Healthcare Providers:  IncredibleEmployment.be  This test is no t yet approved or cleared by the [place] FDA and  has been authorized for detection and/or diagnosis of SARS-CoV-2 by FDA under an Emergency Use Authorization (EUA). This EUA will remain  in effect (meaning this test can be used) for the duration of the COVID-19 declaration under Section 564(b)(1) of the Act, 21 U.S.C.section  360bbb-3(b)(1), unless the authorization is terminated  or revoked sooner.       Influenza A by PCR NEGATIVE NEGATIVE Final   Influenza B by PCR NEGATIVE NEGATIVE Final    Comment: (NOTE) The Xpert Xpress SARS-CoV-2/FLU/RSV plus assay is intended as an aid in the diagnosis of influenza from Nasopharyngeal swab specimens and should not be used as a sole basis for treatment. Nasal washings and aspirates are unacceptable for Xpert Xpress SARS-CoV-2/FLU/RSV testing.  Fact Sheet for Patients: EntrepreneurPulse.com.au  Fact Sheet for Healthcare Providers: IncredibleEmployment.be  This test is not yet approved or cleared by the [place] FDA and has been authorized for detection and/or diagnosis of SARS-CoV-2 by FDA under an Emergency Use Authorization (EUA). This EUA will remain in effect (meaning this test can be used) for the duration of the COVID-19 declaration under Section 564(b)(1) of the Act, 21 U.S.C. section 360bbb-3(b)(1), unless the authorization is terminated or revoked.  Performed at [facility], [address] [place], [place] [address]   Culture, blood (single)     Status: Abnormal   Collection Time: 12/20/20  4:53 AM   Specimen: Left Antecubital; Blood  Result Value Ref Range Status   Specimen Description   Final    LEFT ANTECUBITAL Performed at [facility], [address] [place], [place] [address]    Special Requests   Final    BOTTLES DRAWN AEROBIC AND ANAEROBIC Blood Culture adequate volume Performed at [facility], [address] [place], [place] [address]    Culture  Setup Time   Final    IN BOTH AEROBIC AND ANAEROBIC BOTTLES GRAM POSITIVE COCCI Organism ID to follow CRITICAL RESULT CALLED TO, READ BACK BY AND VERIFIED WITH: C AMEND [facility] 12/21/20 0228 JDW Performed at [facility], [place] [address], [place], [place] [address]  Culture (A)  Final    METHICILLIN  RESISTANT STAPHYLOCOCCUS AUREUS NONVIABLE  STAPHYLOCOCCUS EPIDERMIDIS    Report Status 12/24/2020 FINAL  Final   Organism ID, Bacteria METHICILLIN RESISTANT STAPHYLOCOCCUS AUREUS  Final      Susceptibility   Methicillin resistant staphylococcus aureus - MIC*    CIPROFLOXACIN >=8 RESISTANT Resistant     ERYTHROMYCIN >=8 RESISTANT Resistant     GENTAMICIN <=0.5 SENSITIVE Sensitive     OXACILLIN >=4 RESISTANT Resistant     TETRACYCLINE <=1 SENSITIVE Sensitive     VANCOMYCIN 1 SENSITIVE Sensitive     TRIMETH/SULFA >=320 RESISTANT Resistant     CLINDAMYCIN >=8 RESISTANT Resistant     RIFAMPIN <=0.5 SENSITIVE Sensitive     Inducible Clindamycin NEGATIVE Sensitive     * METHICILLIN RESISTANT STAPHYLOCOCCUS AUREUS  Blood Culture ID Panel (Reflexed)     Status: Abnormal   Collection Time: 12/20/20  4:53 AM  Result Value Ref Range Status   Enterococcus faecalis NOT DETECTED NOT DETECTED Final   Enterococcus Faecium NOT DETECTED NOT DETECTED Final   Listeria monocytogenes NOT DETECTED NOT DETECTED Final   Staphylococcus species DETECTED (A) NOT DETECTED Final    Comment: CRITICAL RESULT CALLED TO, READ BACK BY AND VERIFIED WITH: C AMEND PHAR[facility] 12/21/20 0228 JDW    Staphylococcus aureus (BCID) DETECTED (A) NOT DETECTED Final    Comment: Methicillin (oxacillin)-resistant Staphylococcus aureus (MRSA). MRSA is predictably resistant to beta-lactam antibiotics (except ceftaroline). Preferred therapy is vancomycin unless clinically contraindicated. Patient requires contact precautions if  hospitalized. CRITICAL RESULT CALLED TO, READ BACK BY AND VERIFIED WITH: C AMEND PHARMD 12/21/20 0228 JDW    Staphylococcus epidermidis DETECTED (A) NOT DETECTED Final    Comment: CRITICAL RESULT CALLED TO, READ BACK BY AND VERIFIED WITH: C AMEND PHARMD 12/21/20 0228 JDW    Staphylococcus lugdunensis NOT DETECTED NOT DETECTED Final   Streptococcus species NOT DETECTED NOT DETECTED Final   Streptococcus  agalactiae NOT DETECTED NOT DETECTED Final   Streptococcus pneumoniae NOT DETECTED NOT DETECTED Final   Streptococcus pyogenes NOT DETECTED NOT DETECTED Final   A.calcoaceticus-baumannii NOT DETECTED NOT DETECTED Final   Bacteroides fragilis NOT DETECTED NOT DETECTED Final   Enterobacterales NOT DETECTED NOT DETECTED Final   Enterobacter cloacae complex NOT DETECTED NOT DETECTED Final   Escherichia coli NOT DETECTED NOT DETECTED Final   Klebsiella aerogenes NOT DETECTED NOT DETECTED Final   Klebsiella oxytoca NOT DETECTED NOT DETECTED Final   Klebsiella pneumoniae NOT DETECTED NOT DETECTED Final   Proteus species NOT DETECTED NOT DETECTED Final   Salmonella species NOT DETECTED NOT DETECTED Final   Serratia marcescens NOT DETECTED NOT DETECTED Final   Haemophilus influenzae NOT DETECTED NOT DETECTED Final   Neisseria meningitidis NOT DETECTED NOT DETECTED Final   Pseudomonas aeruginosa NOT DETECTED NOT DETECTED Final   Stenotrophomonas maltophilia NOT DETECTED NOT DETECTED Final   Candida albicans NOT DETECTED NOT DETECTED Final   Candida auris NOT DETECTED NOT DETECTED Final   Candida glabrata NOT DETECTED NOT DETECTED Final   Candida krusei NOT DETECTED NOT DETECTED Final   Candida parapsilosis NOT DETECTED NOT DETECTED Final   Candida tropicalis NOT DETECTED NOT DETECTED Final   Cryptococcus neoformans/gattii NOT DETECTED NOT DETECTED Final   Methicillin resistance mecA/C DETECTED (A) NOT DETECTED Final    Comment: CRITICAL RESULT CALLED TO, READ BACK BY AND VERIFIED WITH: C AMEND PHARMD 12/21/20 0228 JDW    Meth resistant mecA/C and MREJ DETECTED (A) NOT DETECTED Final    Comment:  CRITICAL RESULT CALLED TO, READ BACK BY AND VERIFIED WITH: C AMEND [facility] 12/21/20 0228 JDW Performed at [facility], [place] [address], [place], [place] [address]   MRSA PCR Screening     Status: Abnormal   Collection Time: 12/20/20  6:15 PM   Specimen: Nasal Mucosa; Nasopharyngeal  Result  Value Ref Range Status   MRSA by PCR POSITIVE (A) NEGATIVE Final    Comment:        The GeneXpert MRSA Assay (FDA approved for NASAL specimens only), is one component of a comprehensive MRSA colonization surveillance program. It is not intended to diagnose MRSA infection nor to guide or monitor treatment for MRSA infections. RESULT CALLED TO, READ BACK BY AND VERIFIED WITH: RN T. TIE [id number] FCP Performed at [place] [address] [place], [place] [address]   Culture, blood (routine x 2)     Status: None (Preliminary result)   Collection Time: 12/22/20  6:46 AM   Specimen: BLOOD RIGHT WRIST  Result Value Ref Range Status   Specimen Description BLOOD RIGHT WRIST  Final   Special Requests   Final    BOTTLES DRAWN AEROBIC ONLY Blood Culture adequate volume   Culture   Final    NO GROWTH 3 DAYS Performed at [facility], [address], [place], [place] [address]    Report Status PENDING  Incomplete  Culture, blood (Routine X 2) w Reflex to ID Panel     Status: None (Preliminary result)   Collection Time: 12/22/20  9:11 AM   Specimen: BLOOD  Result Value Ref Range Status   Specimen Description BLOOD BLOOD LEFT WRIST  Final   Special Requests   Final    BOTTLES DRAWN AEROBIC ONLY Blood Culture adequate volume   Culture   Final    NO GROWTH 3 DAYS Performed at [facility], [place] [address], [place], [place] [address]    Report Status PENDING  Incomplete     Radiology Studies: No results found.  Scheduled Meds: . Chlorhexidine Gluconate Cloth  6 each Topical Q0600  . enoxaparin (LOVENOX) injection  40 mg Subcutaneous Q24H  . nicotine  14 mg Transdermal Daily  . sodium chloride flush  3 mL Intravenous Q12H   Continuous Infusions: . sodium chloride 10 mL/hr at 12/20/20 2115  . vancomycin 1,250 mg (12/25/20 0544)     LOS: 5 days   Marylu Lund, MD [facility] Pager On Amion  If 7PM-7AM, please contact night-coverage 12/25/2020,  3:51 PM

## 2020-12-26 ENCOUNTER — Inpatient Hospital Stay: Payer: Self-pay

## 2020-12-26 DIAGNOSIS — I38 Endocarditis, valve unspecified: Secondary | ICD-10-CM | POA: Diagnosis not present

## 2020-12-26 DIAGNOSIS — R7881 Bacteremia: Secondary | ICD-10-CM | POA: Diagnosis not present

## 2020-12-26 LAB — COMPREHENSIVE METABOLIC PANEL
ALT: 90 U/L — ABNORMAL HIGH (ref 0–44)
AST: 70 U/L — ABNORMAL HIGH (ref 15–41)
Albumin: 3 g/dL — ABNORMAL LOW (ref 3.5–5.0)
Alkaline Phosphatase: 56 U/L (ref 38–126)
Anion gap: 8 (ref 5–15)
BUN: 8 mg/dL (ref 6–20)
CO2: 26 mmol/L (ref 22–32)
Calcium: 9.4 mg/dL (ref 8.9–10.3)
Chloride: 101 mmol/L (ref 98–111)
Creatinine, Ser: 0.82 mg/dL (ref 0.44–1.00)
GFR, Estimated: >60 mL/min (ref >60)
Glucose, Bld: 97 mg/dL (ref 70–99)
Potassium: 4.1 mmol/L (ref 3.5–5.1)
Sodium: 135 mmol/L (ref 135–145)
Total Bilirubin: 0.4 mg/dL (ref 0.3–1.2)
Total Protein: 6.4 g/dL — ABNORMAL LOW (ref 6.5–8.1)

## 2020-12-26 LAB — CBC WITH DIFFERENTIAL/PLATELET
Abs Immature Granulocytes: 0.03 10*3/uL (ref 0.00–0.07)
Basophils Absolute: 0 10*3/uL (ref 0.0–0.1)
Basophils Relative: 1 %
Eosinophils Absolute: 0.3 10*3/uL (ref 0.0–0.5)
Eosinophils Relative: 4 %
HCT: 35.1 % — ABNORMAL LOW (ref 36.0–46.0)
Hemoglobin: 11.8 g/dL — ABNORMAL LOW (ref 12.0–15.0)
Immature Granulocytes: 1 %
Lymphocytes Relative: 42 %
Lymphs Abs: 2.8 10*3/uL (ref 0.7–4.0)
MCH: 31.6 pg (ref 26.0–34.0)
MCHC: 33.6 g/dL (ref 30.0–36.0)
MCV: 94.1 fL (ref 80.0–100.0)
Monocytes Absolute: 0.6 10*3/uL (ref 0.1–1.0)
Monocytes Relative: 10 %
Neutro Abs: 2.8 10*3/uL (ref 1.7–7.7)
Neutrophils Relative %: 42 %
Platelets: 360 10*3/uL (ref 150–400)
RBC: 3.73 MIL/uL — ABNORMAL LOW (ref 3.87–5.11)
RDW: 12.6 % (ref 11.5–15.5)
WBC: 6.5 10*3/uL (ref 4.0–10.5)
nRBC: 0 % (ref 0.0–0.2)

## 2020-12-26 LAB — VANCOMYCIN, TROUGH: Vancomycin Tr: 16 ug/mL (ref 15–20)

## 2020-12-26 NOTE — Progress Notes (Signed)
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 She has had similar symptoms like this previously in the past. Current episode started about 4 days ago. Initially rash started out as little pimples that sometimes pop on their own or develop into abscesses. Denied any significant fever, chills, chest pain, cough, shortness of breath, leg swelling, abdominal pain, or blood in stool. Last admitted into the hospital in May of this year for septic shock with tricuspid valve endocarditis related to strep pyrogens bacteremia. she only stayed in hospital couple days prior to leaving [place] and never followed up with any provider. She claims to be clean from IV drugs for 120 days after getting out of jail on August 19.  Upon admission into the emergency department patient was hemodynamically stable other than mild tachycardia.  Urine drug screen was positive for amphetamines however she claims that she takes Adderall which is prescribed. Chest x-ray showed no acute abnormalities. ID consulted and recommended checking 3 sets of blood cultures starting cefazolin.  Blood culture growing MRSA and staph epidermidis. Repeat blood culture from 12/22/2020 - thus far.  TEE negative for any vegetations.  Assessment & Plan:   Principal Problem:   Sepsis ([place]) Active Problems:   Hepatitis C, chronic, maternal, antepartum ([place])   Cigarette smoker   Polysubstance dependence including opioid type drug without complication, episodic abuse ([place])   Endocarditis   Rash   Bacteremia   Sepsis secondary to MRSA bacteremia without endocarditis: - Patient  met sepsis criteria due to tachycardic with white blood cell count elevated up to 12.5 meeting SIRS criteria. Previously positive for group A strep (strep pyogenes) when admitted back in May with tricuspid valve endocarditis, but left AMA prior to receiving full recommended treatment. CRP was elevated at 6.3 and sedimentation rate was 40. ID is on board.   -Her repeat transthoracic echo does not show any endocarditis, Blood culture growing MRSA and staph epidermidis. Repeat blood culture from 12/22/2020 - thus far.   -Status post TEE today which does not show any endocarditis.   -ID had been following. Recommendation for PICC placement with abx through 01/19/21  Unknown skin lesions, possibly carbuncles:.  -Patient presents with complaints of multiple skin ulcerations mostly concentrated on the face and neck.   -Worked up by ID, RPR and treponema neg  Asymptomatic bacteriuria:  -Cont to hold off on antibiotics specific for UTI at this time  Hyponatremia:  -Normalized  Elevated transaminases  history of chronic hepatitis C: Acute.   - LFTs overll remaining stable  Polysubstance abuse:  -Patient reportedly was clean from IV drug use for the last 120 days. Urine drug screen was positive for amphetamines however pt reports taking Adderall. Prior history of abuse also includes heroin, cocaine, and marijuana. -Continue to monitor for withdrawal symptoms.  Ativan IV if needed -Cessation done this visit  Mild left preseptal cellulitis: ID was concerned about possible osteomyelitis.  Patient has no visual symptoms or deep pain in the left eye.  Per ID recommendation, ophthalmology was consulted and pt is currently treated for preseptal cellulitis on current abx.  Patient continues to improve every day.  No problem with vision.  Mild AKI: Secondary to sepsis.  Resolved. Cont to follow bmet trends  DVT prophylaxis: Lovenox subq Code Status: Full Family Communication: Pt in room, family not  at bedside  Status is: Inpatient  Remains inpatient appropriate because:IV treatments appropriate due to intensity of illness or inability to take PO   Dispo: The patient is from: Home              Anticipated d/c is to: Home              Anticipated d/c date is: > 3 days after completing abx, last dose 1/23              Patient currently is not medically stable to d/c.  Consultants:   ID  Cardiology  Procedures:   TEE, neg for vegetations  Antimicrobials: Anti-infectives (From admission, onward)   Start     Dose/Rate Route Frequency Ordered Stop   12/24/20 0600  vancomycin (VANCOREADY) IVPB 1250 mg/250 mL        1,250 mg 166.7 mL/hr over 90 Minutes Intravenous Every 12 hours 12/23/20 1723     12/23/20 1800  vancomycin (VANCOREADY) IVPB 500 mg/100 mL        500 mg 100 mL/hr over 60 Minutes Intravenous  Once 12/23/20 1724 12/23/20 1851   12/21/20 1600  vancomycin (VANCOREADY) IVPB 750 mg/150 mL  Status:  Discontinued        750 mg 150 mL/hr over 60 Minutes Intravenous Every 12 hours 12/21/20 0249 12/23/20 1723   12/21/20 0300  vancomycin (VANCOCIN) IVPB 1000 mg/200 mL premix        1,000 mg 200 mL/hr over 60 Minutes Intravenous STAT 12/21/20 0249 12/21/20 0711   12/20/20 0600  ceFAZolin (ANCEF) IVPB 1 g/50 mL premix  Status:  Discontinued        1 g 100 mL/hr over 30 Minutes Intravenous Every 8 hours 12/19/20 2254 12/21/20 0242      Subjective: No complaints at this time  Objective: Vitals:   12/26/20 0535 12/26/20 0828 12/26/20 0831 12/26/20 1152  BP: (!) 89/53 (!) 90/58 (!) 90/58 (!) 93/58  Pulse:      Resp: '15 13 16 15  ' Temp: 98.4 F (36.9 C) 98.1 F (36.7 C) 98.1 F (36.7 C) 98.2 F (36.8 C)  TempSrc: Oral Oral Oral Oral  SpO2: 100% 98% 99% 100%  Weight:      Height:        Intake/Output Summary (Last 24 hours) at 12/26/2020 1544 Last data filed at 12/26/2020 1000 Gross per 24 hour  Intake 980 ml  Output --  Net 980 ml   Filed Weights    12/19/20 1634  Weight: 56.2 kg    Examination: General exam: Awake, laying in bed, in nad Respiratory system: Normal respiratory effort, no wheezing Cardiovascular system: regular rate, s1, s2 Gastrointestinal system: Soft, nondistended, positive BS Central nervous system: CN2-12 grossly intact, strength intact Extremities: Perfused, no clubbing Skin: Normal skin turgor, no notable skin lesions seen Psychiatry: Mood normal // no visual hallucinations   Data Reviewed: I have personally reviewed following labs and imaging studies  CBC: Recent Labs  Lab 12/22/20 0640 12/23/20 0349 12/24/20 0251 12/25/20 0136 12/26/20 0530  WBC 6.2 5.5 6.0 6.7 6.5  NEUTROABS 2.4 1.8 2.6 3.0 2.8  HGB 10.7* 11.1* 12.0 11.8* 11.8*  HCT 32.9* 33.8* 34.8* 36.8 35.1*  MCV 94.5 93.9 92.3 95.8 94.1  PLT 301 295 354 357 832   Basic Metabolic Panel: Recent Labs  Lab 12/21/20 0104 12/22/20 0640 12/23/20  9758 12/25/20 0136 12/26/20 0530  NA 135 135 134* 137 135  K 4.3 4.2 4.0 4.2 4.1  CL 98 104 100 104 101  CO2 25 20* '23 25 26  ' GLUCOSE 93 98 132* 98 97  BUN '16 10 9 9 8  ' CREATININE 1.06* 0.77 0.96 0.79 0.82  CALCIUM 8.8* 8.4* 8.8* 8.9 9.4  MG  --   --  1.9  --   --    GFR: Estimated Creatinine Clearance: 78.9 mL/min (by C-G formula based on SCr of 0.82 mg/dL). Liver Function Tests: Recent Labs  Lab 12/19/20 1647 12/21/20 0104 12/22/20 0640 12/23/20 0349 12/26/20 0530  AST 43* 48* 37 45* 70*  ALT 59* 47* 43 50* 90*  ALKPHOS 67 61 53 57 56  BILITOT 0.7 1.0 0.4 0.4 0.4  PROT 7.5 6.6 6.1* 6.4* 6.4*  ALBUMIN 3.9 3.3* 3.0* 3.0* 3.0*   No results for input(s): LIPASE, AMYLASE in the last 168 hours. No results for input(s): AMMONIA in the last 168 hours. Coagulation Profile: No results for input(s): INR, PROTIME in the last 168 hours. Cardiac Enzymes: No results for input(s): CKTOTAL, CKMB, CKMBINDEX, TROPONINI in the last 168 hours. BNP (last 3 results) No results for input(s): PROBNP  in the last 8760 hours. HbA1C: No results for input(s): HGBA1C in the last 72 hours. CBG: No results for input(s): GLUCAP in the last 168 hours. Lipid Profile: No results for input(s): CHOL, HDL, LDLCALC, TRIG, CHOLHDL, LDLDIRECT in the last 72 hours. Thyroid Function Tests: No results for input(s): TSH, T4TOTAL, FREET4, T3FREE, THYROIDAB in the last 72 hours. Anemia Panel: No results for input(s): VITAMINB12, FOLATE, FERRITIN, TIBC, IRON, RETICCTPCT in the last 72 hours. Sepsis Labs: Recent Labs  Lab 12/19/20 1647  LATICACIDVEN 1.7    Recent Results (from the past 240 hour(s))  Blood culture (routine x 2)     Status: None   Collection Time: 12/19/20 11:00 PM   Specimen: Left Antecubital; Blood  Result Value Ref Range Status   Specimen Description   Final    LEFT ANTECUBITAL Performed at [facility], [address] [place], [place] [address]    Special Requests   Final    BOTTLES DRAWN AEROBIC AND ANAEROBIC Blood Culture adequate volume Performed at [facility], [address] [place], [place] [address]    Culture   Final    NO GROWTH 5 DAYS Performed at [facility], [place] [address], [place], [place] [address]    Report Status 12/25/2020 FINAL  Final  Blood culture (routine x 2)     Status: Abnormal   Collection Time: 12/19/20 11:09 PM   Specimen: BLOOD RIGHT FOREARM  Result Value Ref Range Status   Specimen Description   Final    BLOOD RIGHT FOREARM Performed at [facility], [address] [place], [place] [address]    Special Requests   Final    BOTTLES DRAWN AEROBIC AND ANAEROBIC Blood Culture results may not be optimal due to an excessive volume of blood received in culture bottles Performed at [facility], [address] [place], [place] [address]    Culture  Setup Time   Final    GRAM POSITIVE COCCI AEROBIC BOTTLE ONLY CRITICAL VALUE NOTED.  VALUE IS CONSISTENT WITH PREVIOUSLY REPORTED AND CALLED  VALUE.    Culture (A)  Final    STAPHYLOCOCCUS AUREUS SUSCEPTIBILITIES PERFORMED ON PREVIOUS CULTURE WITHIN THE LAST 5 DAYS.  Performed at [facility], [place] [address] [place], [place] [address]    Report Status 12/25/2020 FINAL  Final  Resp Panel by RT-PCR (Flu A&B, Covid) Nasopharyngeal Swab     Status: None   Collection Time: 12/19/20 11:54 PM   Specimen: Nasopharyngeal Swab; Nasopharyngeal(NP) swabs in vial transport medium  Result Value Ref Range Status   SARS Coronavirus 2 by RT PCR NEGATIVE NEGATIVE Final    Comment: (NOTE) SARS-CoV-2 target nucleic acids are NOT DETECTED.  The SARS-CoV-2 RNA is generally detectable in upper respiratory specimens during the acute phase of infection. The lowest concentration of SARS-CoV-2 viral copies this assay can detect is 138 copies/mL. A negative result does not preclude SARS-Cov-2 infection and should not be used as the sole basis for treatment or other patient management decisions. A negative result may occur with  improper specimen collection/handling, submission of specimen other than nasopharyngeal swab, presence of viral mutation(s) within the areas targeted by this assay, and inadequate number of viral copies(<138 copies/mL). A negative result must be combined with clinical observations, patient history, and epidemiological information. The expected result is Negative.  Fact Sheet for Patients:  EntrepreneurPulse.com.au  Fact Sheet for Healthcare Providers:  IncredibleEmployment.be  This test is no t yet approved or cleared by the [place] FDA and  has been authorized for detection and/or diagnosis of SARS-CoV-2 by FDA under an Emergency Use Authorization (EUA). This EUA will remain  in effect (meaning this test can be used) for the duration of the COVID-19 declaration under Section 564(b)(1) of the Act, 21 U.S.C.section 360bbb-3(b)(1), unless the authorization is terminated  or  revoked sooner.       Influenza A by PCR NEGATIVE NEGATIVE Final   Influenza B by PCR NEGATIVE NEGATIVE Final    Comment: (NOTE) The Xpert Xpress SARS-CoV-2/FLU/RSV plus assay is intended as an aid in the diagnosis of influenza from Nasopharyngeal swab specimens and should not be used as a sole basis for treatment. Nasal washings and aspirates are unacceptable for Xpert Xpress SARS-CoV-2/FLU/RSV testing.  Fact Sheet for Patients: EntrepreneurPulse.com.au  Fact Sheet for Healthcare Providers: IncredibleEmployment.be  This test is not yet approved or cleared by the [place] FDA and has been authorized for detection and/or diagnosis of SARS-CoV-2 by FDA under an Emergency Use Authorization (EUA). This EUA will remain in effect (meaning this test can be used) for the duration of the COVID-19 declaration under Section 564(b)(1) of the Act, 21 U.S.C. section 360bbb-3(b)(1), unless the authorization is terminated or revoked.  Performed at [facility], [address] [place], [place] [address]   Culture, blood (single)     Status: Abnormal   Collection Time: 12/20/20  4:53 AM   Specimen: Left Antecubital; Blood  Result Value Ref Range Status   Specimen Description   Final    LEFT ANTECUBITAL Performed at [facility], [address] [place], [place] [address]    Special Requests   Final    BOTTLES DRAWN AEROBIC AND ANAEROBIC Blood Culture adequate volume Performed at [facility], [address] [place], [place] [address]    Culture  Setup Time   Final    IN BOTH AEROBIC AND ANAEROBIC BOTTLES GRAM POSITIVE COCCI Organism ID to follow CRITICAL RESULT CALLED TO, READ BACK BY AND VERIFIED WITH: C AMEND [facility] 12/21/20 0228 JDW Performed at [facility], [place] [address] [place], [place] [address]    Culture (A)  Final  METHICILLIN RESISTANT STAPHYLOCOCCUS AUREUS NONVIABLE  STAPHYLOCOCCUS  EPIDERMIDIS    Report Status 12/24/2020 FINAL  Final   Organism ID, Bacteria METHICILLIN RESISTANT STAPHYLOCOCCUS AUREUS  Final      Susceptibility   Methicillin resistant staphylococcus aureus - MIC*    CIPROFLOXACIN >=8 RESISTANT Resistant     ERYTHROMYCIN >=8 RESISTANT Resistant     GENTAMICIN <=0.5 SENSITIVE Sensitive     OXACILLIN >=4 RESISTANT Resistant     TETRACYCLINE <=1 SENSITIVE Sensitive     VANCOMYCIN 1 SENSITIVE Sensitive     TRIMETH/SULFA >=320 RESISTANT Resistant     CLINDAMYCIN >=8 RESISTANT Resistant     RIFAMPIN <=0.5 SENSITIVE Sensitive     Inducible Clindamycin NEGATIVE Sensitive     * METHICILLIN RESISTANT STAPHYLOCOCCUS AUREUS  Blood Culture ID Panel (Reflexed)     Status: Abnormal   Collection Time: 12/20/20  4:53 AM  Result Value Ref Range Status   Enterococcus faecalis NOT DETECTED NOT DETECTED Final   Enterococcus Faecium NOT DETECTED NOT DETECTED Final   Listeria monocytogenes NOT DETECTED NOT DETECTED Final   Staphylococcus species DETECTED (A) NOT DETECTED Final    Comment: CRITICAL RESULT CALLED TO, READ BACK BY AND VERIFIED WITH: C AMEND PHAR[facility] 12/21/20 0228 JDW    Staphylococcus aureus (BCID) DETECTED (A) NOT DETECTED Final    Comment: Methicillin (oxacillin)-resistant Staphylococcus aureus (MRSA). MRSA is predictably resistant to beta-lactam antibiotics (except ceftaroline). Preferred therapy is vancomycin unless clinically contraindicated. Patient requires contact precautions if  hospitalized. CRITICAL RESULT CALLED TO, READ BACK BY AND VERIFIED WITH: C AMEND PHARMD 12/21/20 0228 JDW    Staphylococcus epidermidis DETECTED (A) NOT DETECTED Final    Comment: CRITICAL RESULT CALLED TO, READ BACK BY AND VERIFIED WITH: C AMEND PHARMD 12/21/20 0228 JDW    Staphylococcus lugdunensis NOT DETECTED NOT DETECTED Final   Streptococcus species NOT DETECTED NOT DETECTED Final   Streptococcus agalactiae NOT DETECTED NOT DETECTED Final   Streptococcus  pneumoniae NOT DETECTED NOT DETECTED Final   Streptococcus pyogenes NOT DETECTED NOT DETECTED Final   A.calcoaceticus-baumannii NOT DETECTED NOT DETECTED Final   Bacteroides fragilis NOT DETECTED NOT DETECTED Final   Enterobacterales NOT DETECTED NOT DETECTED Final   Enterobacter cloacae complex NOT DETECTED NOT DETECTED Final   Escherichia coli NOT DETECTED NOT DETECTED Final   Klebsiella aerogenes NOT DETECTED NOT DETECTED Final   Klebsiella oxytoca NOT DETECTED NOT DETECTED Final   Klebsiella pneumoniae NOT DETECTED NOT DETECTED Final   Proteus species NOT DETECTED NOT DETECTED Final   Salmonella species NOT DETECTED NOT DETECTED Final   Serratia marcescens NOT DETECTED NOT DETECTED Final   Haemophilus influenzae NOT DETECTED NOT DETECTED Final   Neisseria meningitidis NOT DETECTED NOT DETECTED Final   Pseudomonas aeruginosa NOT DETECTED NOT DETECTED Final   Stenotrophomonas maltophilia NOT DETECTED NOT DETECTED Final   Candida albicans NOT DETECTED NOT DETECTED Final   Candida auris NOT DETECTED NOT DETECTED Final   Candida glabrata NOT DETECTED NOT DETECTED Final   Candida krusei NOT DETECTED NOT DETECTED Final   Candida parapsilosis NOT DETECTED NOT DETECTED Final   Candida tropicalis NOT DETECTED NOT DETECTED Final   Cryptococcus neoformans/gattii NOT DETECTED NOT DETECTED Final   Methicillin resistance mecA/C DETECTED (A) NOT DETECTED Final    Comment: CRITICAL RESULT CALLED TO, READ BACK BY AND VERIFIED WITH: C AMEND PHARMD 12/21/20 0228 JDW    Meth resistant mecA/C and MREJ DETECTED (A) NOT DETECTED Final    Comment: CRITICAL RESULT CALLED TO, READ BACK BY  AND VERIFIED WITH: C AMEND [facility] 12/21/20 0228 JDW Performed at [facility], [place] [address], [place], [place] [address]   MRSA PCR Screening     Status: Abnormal   Collection Time: 12/20/20  6:15 PM   Specimen: Nasal Mucosa; Nasopharyngeal  Result Value Ref Range Status   MRSA by PCR POSITIVE (A) NEGATIVE  Final    Comment:        The GeneXpert MRSA Assay (FDA approved for NASAL specimens only), is one component of a comprehensive MRSA colonization surveillance program. It is not intended to diagnose MRSA infection nor to guide or monitor treatment for MRSA infections. RESULT CALLED TO, READ BACK BY AND VERIFIED WITH: RN T. TIE [id number] 2010 FCP Performed at [place] [address] [place], [place] [address]   Culture, blood (routine x 2)     Status: None (Preliminary result)   Collection Time: 12/22/20  6:46 AM   Specimen: BLOOD RIGHT WRIST  Result Value Ref Range Status   Specimen Description BLOOD RIGHT WRIST  Final   Special Requests   Final    BOTTLES DRAWN AEROBIC ONLY Blood Culture adequate volume   Culture   Final    NO GROWTH 4 DAYS Performed at [facility], [address] [place], [place] [address]    Report Status PENDING  Incomplete  Culture, blood (Routine X 2) w Reflex to ID Panel     Status: None (Preliminary result)   Collection Time: 12/22/20  9:11 AM   Specimen: BLOOD  Result Value Ref Range Status   Specimen Description BLOOD BLOOD LEFT WRIST  Final   Special Requests   Final    BOTTLES DRAWN AEROBIC ONLY Blood Culture adequate volume   Culture   Final    NO GROWTH 4 DAYS Performed at [facility], [place] [address] [place],  [address]    Report Status PENDING  Incomplete     Radiology Studies: No results found.  Scheduled Meds: . enoxaparin (LOVENOX) injection  40 mg Subcutaneous Q24H  . nicotine  14 mg Transdermal Daily  . sodium chloride flush  3 mL Intravenous Q12H   Continuous Infusions: . sodium chloride 10 mL/hr at 12/20/20 2115  . vancomycin 1,250 mg (12/26/20 0533)     LOS: 6 days   Marylu Lund, MD [facility] Pager On Amion  If 7PM-7AM, please contact night-coverage 12/26/2020, 3:44 PM

## 2020-12-26 NOTE — Progress Notes (Signed)
Pharmacy Antibiotic Note  [name] is a 29 y.o. female admitted on 12/19/2020 with recurrent skin infections found to have MRSA bacteremia. 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  Pharmacy has been consulted for vancomycin dosing. Pharmacy has been consulted for vancomycin dosing. Patients remains afebrile, WBC wnl, and stable. TTE and TEE showed no vegetation. Patient is day 6 of vancomycin abx. VT this am drawn correctly is therapeutic at 16. Per ID, duration of therapy will be 4 weeks from negative Bcx on 12/22/20 which will be 01/19/21  Plan: Continue 1250mg  q12h  Patient has agreed to remain inpatient for LOT, but continue to follow. If decided to leave will need oral options.  Monitor cultures, renal function, and clinical progression  Height: 5' (152.4 cm) Weight: 56.2 kg (124 lb) IBW/kg (Calculated) : 45.5  Temp (24hrs), Avg:98.1 F (36.7 C), Min:98 F (36.7 C), Max:98.4 F (36.9 C)  Recent Labs  Lab 12/19/20 1647 12/21/20 0104 12/22/20 0640 12/23/20 0349 12/23/20 0349 12/23/20 1535 12/24/20 0251 12/25/20 0136 12/26/20 0530  WBC 12.5* 10.3 6.2 5.5  --   --  6.0 6.7 6.5  CREATININE 0.90 1.06* 0.77 0.96  --   --   --  0.79 0.82  LATICACIDVEN 1.7  --   --   --   --   --   --   --   --   VANCOTROUGH  --   --   --  39*   < > 9*  --   --  16   < > = values in this interval not displayed.    Estimated Creatinine Clearance: 78.9 mL/min (by C-G formula based on SCr of 0.82 mg/dL).    Allergies  Allergen Reactions  . Dextromethorphan Swelling    Facial swelling    Antimicrobials this admission: Vancomycin 12/25 >>   Dose adjustments this admission: 12/27: vancomycin 750mg  q12h changed to 1250mg  q12h  Microbiology results: 12/23 bcx: 1/4 MRSA 12/24 bcx:  2/2 MRSA 12/26 bcx: ngtd   Thank you for allowing pharmacy to be a part of this patient's care. 1/24, PharmD, MBA Pharmacy  Resident [phone] 12/26/2020 9:47 AM

## 2020-12-27 DIAGNOSIS — A4102 Sepsis due to Methicillin resistant Staphylococcus aureus: Principal | ICD-10-CM

## 2020-12-27 DIAGNOSIS — F1920 Other psychoactive substance dependence, uncomplicated: Secondary | ICD-10-CM

## 2020-12-27 DIAGNOSIS — B182 Chronic viral hepatitis C: Secondary | ICD-10-CM | POA: Diagnosis not present

## 2020-12-27 DIAGNOSIS — R7881 Bacteremia: Secondary | ICD-10-CM | POA: Diagnosis not present

## 2020-12-27 LAB — CULTURE, BLOOD (ROUTINE X 2)
Culture: NO GROWTH
Culture: NO GROWTH
Special Requests: ADEQUATE
Special Requests: ADEQUATE

## 2020-12-27 LAB — CBC WITH DIFFERENTIAL/PLATELET
Abs Immature Granulocytes: 0.03 10*3/uL (ref 0.00–0.07)
Basophils Absolute: 0 10*3/uL (ref 0.0–0.1)
Basophils Relative: 1 %
Eosinophils Absolute: 0.3 10*3/uL (ref 0.0–0.5)
Eosinophils Relative: 4 %
HCT: 35.4 % — ABNORMAL LOW (ref 36.0–46.0)
Hemoglobin: 11.8 g/dL — ABNORMAL LOW (ref 12.0–15.0)
Immature Granulocytes: 0 %
Lymphocytes Relative: 41 %
Lymphs Abs: 3.1 10*3/uL (ref 0.7–4.0)
MCH: 31.6 pg (ref 26.0–34.0)
MCHC: 33.3 g/dL (ref 30.0–36.0)
MCV: 94.7 fL (ref 80.0–100.0)
Monocytes Absolute: 0.7 10*3/uL (ref 0.1–1.0)
Monocytes Relative: 9 %
Neutro Abs: 3.4 10*3/uL (ref 1.7–7.7)
Neutrophils Relative %: 45 %
Platelets: 292 10*3/uL (ref 150–400)
RBC: 3.74 MIL/uL — ABNORMAL LOW (ref 3.87–5.11)
RDW: 12.6 % (ref 11.5–15.5)
WBC: 7.5 10*3/uL (ref 4.0–10.5)
nRBC: 0 % (ref 0.0–0.2)

## 2020-12-27 MED ORDER — HEPATITIS B VAC RECOMBINANT 10 MCG/ML IJ SUSP
1.0000 mL | Freq: Once | INTRAMUSCULAR | Status: AC
  Administered 2020-12-27: 10 ug via INTRAMUSCULAR
  Filled 2020-12-27: qty 1

## 2020-12-27 MED ORDER — SODIUM CHLORIDE 0.9% FLUSH
10.0000 mL | Freq: Two times a day (BID) | INTRAVENOUS | Status: DC
  Administered 2020-12-30 – 2021-01-03 (×6): 10 mL

## 2020-12-27 MED ORDER — CHLORHEXIDINE GLUCONATE CLOTH 2 % EX PADS
6.0000 | MEDICATED_PAD | Freq: Every day | CUTANEOUS | Status: DC
  Administered 2020-12-27 – 2021-01-03 (×8): 6 via TOPICAL

## 2020-12-27 MED ORDER — HEPATITIS A VACCINE 1440 EL U/ML IM SUSP
1.0000 mL | Freq: Once | INTRAMUSCULAR | Status: AC
  Administered 2020-12-27: 1440 [IU] via INTRAMUSCULAR
  Filled 2020-12-27: qty 1

## 2020-12-27 MED ORDER — SODIUM CHLORIDE 0.9% FLUSH
10.0000 mL | INTRAVENOUS | Status: DC | PRN
  Administered 2020-12-31: 10 mL

## 2020-12-27 NOTE — Plan of Care (Signed)

## 2020-12-27 NOTE — Progress Notes (Signed)
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 She has had similar symptoms like this previously in the past. Current episode started about 4 days ago. Initially rash started out as little pimples that sometimes pop on their own or develop into abscesses. Denied any significant fever, chills, chest pain, cough, shortness of breath, leg swelling, abdominal pain, or blood in stool. Last admitted into the hospital in May of this year for septic shock with tricuspid valve endocarditis related to strep pyrogens bacteremia. she only stayed in hospital couple days prior to leaving [place] and never followed up with any provider. She claims to be clean from IV drugs for 120 days after getting out of jail on August 19.  Upon admission into the emergency department patient was hemodynamically stable other than mild tachycardia.  Urine drug screen was positive for amphetamines however she claims that she takes Adderall which is prescribed. Chest x-ray showed no acute abnormalities. ID consulted and recommended checking 3 sets of blood cultures starting cefazolin.  Blood culture growing MRSA and staph epidermidis. Repeat blood culture from 12/22/2020 - thus far.  TEE negative for any vegetations.  Assessment & Plan:   Principal Problem:   Sepsis ([place]) Active Problems:   Hepatitis C, chronic, maternal, antepartum ([place])   Cigarette smoker   Polysubstance dependence including opioid type drug without complication, episodic abuse ([place])   Endocarditis   Rash   Bacteremia   Sepsis secondary to MRSA bacteremia without endocarditis: - Patient  met sepsis criteria due to tachycardic with white blood cell count elevated up to 12.5 meeting SIRS criteria. Previously positive for group A strep (strep pyogenes) when admitted back in May with tricuspid valve endocarditis, but left AMA prior to receiving full recommended treatment. CRP was elevated at 6.3 and sedimentation rate was 40. ID is on board.   -Her repeat transthoracic echo does not show any endocarditis, Blood culture growing MRSA and staph epidermidis. Repeat blood culture from 12/22/2020 - thus far.   -Status post TEE which does not show any endocarditis.   -ID had been following. Recommendation for PICC placement with abx through 01/19/21. Remains stable  Unknown skin lesions, possibly carbuncles:.  -Patient presents with complaints of multiple skin ulcerations mostly concentrated on the face and neck.   -Worked up by ID, RPR and treponema neg  Asymptomatic bacteriuria:  -Cont to hold off on antibiotics specific for UTI at this time  Hyponatremia:  -Normalized  Elevated transaminases  history of chronic hepatitis C: Acute.   - LFTs overll remaining stable  Polysubstance abuse:  -29 y.o. Patient reportedly was clean from IV drug use for the last 120 days. Urine drug screen was positive for amphetamines however pt reports taking Adderall. Prior history of abuse also includes heroin, cocaine, and marijuana. -Continue to monitor for withdrawal symptoms.  Ativan IV if needed -Cessation done this visit  Mild left preseptal cellulitis: ID was concerned about possible osteomyelitis.  Patient has no visual symptoms or deep pain in the left eye.  Per ID recommendation, ophthalmology was consulted and pt is currently treated for preseptal cellulitis on current abx.  Patient continues to improve every day.  No problem with vision.  Mild AKI: Secondary to  sepsis.  Resolved. Cont to follow bmet trends  DVT prophylaxis: Lovenox subq Code Status: Full Family Communication: Pt in room,  family not at bedside  Status is: Inpatient  Remains inpatient appropriate because:IV treatments appropriate due to intensity of illness or inability to take PO   Dispo: The patient is from: Home              Anticipated d/c is to: Home              Anticipated d/c date is: > 3 days after completing abx, last dose 1/23              Patient currently is not medically stable to d/c.  Consultants:   ID  Cardiology  Procedures:   TEE, neg for vegetations  Antimicrobials: Anti-infectives (From admission, onward)   Start     Dose/Rate Route Frequency Ordered Stop   12/24/20 0600  vancomycin (VANCOREADY) IVPB 1250 mg/250 mL        1,250 mg 166.7 mL/hr over 90 Minutes Intravenous Every 12 hours 12/23/20 1723     12/23/20 1800  vancomycin (VANCOREADY) IVPB 500 mg/100 mL        500 mg 100 mL/hr over 60 Minutes Intravenous  Once 12/23/20 1724 12/23/20 1851   12/21/20 1600  vancomycin (VANCOREADY) IVPB 750 mg/150 mL  Status:  Discontinued        750 mg 150 mL/hr over 60 Minutes Intravenous Every 12 hours 12/21/20 0249 12/23/20 1723   12/21/20 0300  vancomycin (VANCOCIN) IVPB 1000 mg/200 mL premix        1,000 mg 200 mL/hr over 60 Minutes Intravenous STAT 12/21/20 0249 12/21/20 0711   12/20/20 0600  ceFAZolin (ANCEF) IVPB 1 g/50 mL premix  Status:  Discontinued        1 g 100 mL/hr over 30 Minutes Intravenous Every 8 hours 12/19/20 2254 12/21/20 0242      Subjective: Without complaints at this time  Objective: Vitals:   12/26/20 1955 12/26/20 2346 12/27/20 0420 12/27/20 0759  BP: 104/69 (!) 91/54 (!) 96/58 (!) 92/58  Pulse: 86 98 90 79  Resp: _0 Temp: 98 F (36.7 C) 98.1 F (36.7 C) 98.1 F (36.7 C) 97.6 F (36.4 C)  TempSrc: Oral Oral Oral Oral  SpO2: 100% 96% 95%   Weight:      Height:        Intake/Output Summary (Last 24 hours) at 12/27/2020 1448 Last data filed at 12/27/2020 7262 Gross per 24 hour  Intake 500 ml  Output --  Net 500 ml   Filed  Weights   12/19/20 1634  Weight: 56.2 kg    Examination: General exam: Conversant, in no acute distress Respiratory system: normal chest rise, clear, no audible wheezing  Data Reviewed: I have personally reviewed following labs and imaging studies  CBC: Recent Labs  Lab 12/23/20 0349 12/24/20 0251 12/25/20 0136 12/26/20 0530 12/27/20 0015  WBC 5.5 6.0 6.7 6.5 7.5  NEUTROABS 1.8 2.6 3.0 2.8 3.4  HGB 11.1* 12.0 11.8* 11.8* 11.8*  HCT 33.8* 34.8* 36.8 35.1* 35.4*  MCV 93.9 92.3 95.8 94.1 94.7  PLT 295 354 357 360 035   Basic Metabolic Panel: Recent Labs  Lab 12/21/20 0104 12/22/20 0640 12/23/20 0349 12/25/20 0136 12/26/20 0530  NA 135 135 134* 137 135  K 4.3 4.2 4.0 4.2 4.1  CL 98 104 100 104 101  CO2 25 20* _1 GLUCOSE 93 98 132* 98  97  BUN _0 CREATININE 1.06* 0.77 0.96 0.79 0.82  CALCIUM 8.8* 8.4* 8.8* 8.9 9.4  MG  --   --  1.9  --   --    GFR: Estimated Creatinine Clearance: 78.9 mL/min (by C-G formula based on SCr of 0.82 mg/dL). Liver Function Tests: Recent Labs  Lab 12/21/20 0104 12/22/20 0640 12/23/20 0349 12/26/20 0530  AST 48* 37 45* 70*  ALT 47* 43 50* 90*  ALKPHOS 61 53 57 56  BILITOT 1.0 0.4 0.4 0.4  PROT 6.6 6.1* 6.4* 6.4*  ALBUMIN 3.3* 3.0* 3.0* 3.0*   No results for input(s): LIPASE, AMYLASE in the last 168 hours. No results for input(s): AMMONIA in the last 168 hours. Coagulation Profile: No results for input(s): INR, PROTIME in the last 168 hours. Cardiac Enzymes: No results for input(s): CKTOTAL, CKMB, CKMBINDEX, TROPONINI in the last 168 hours. BNP (last 3 results) No results for input(s): PROBNP in the last 8760 hours. HbA1C: No results for input(s): HGBA1C in the last 72 hours. CBG: No results for input(s): GLUCAP in the last 168 hours. Lipid Profile: No results for input(s): CHOL, HDL, LDLCALC, TRIG, CHOLHDL, LDLDIRECT in the last 72 hours. Thyroid Function Tests: No results for input(s): TSH, T4TOTAL,  FREET4, T3FREE, THYROIDAB in the last 72 hours. Anemia Panel: No results for input(s): VITAMINB12, FOLATE, FERRITIN, TIBC, IRON, RETICCTPCT in the last 72 hours. Sepsis Labs: No results for input(s): PROCALCITON, LATICACIDVEN in the last 168 hours.  Recent Results (from the past 240 hour(s))  Blood culture (routine x 2)     Status: None   Collection Time: 12/19/20 11:00 PM   Specimen: Left Antecubital; Blood  Result Value Ref Range Status   Specimen Description   Final    LEFT ANTECUBITAL Performed at [facility], [address] [place], [place] [address]    Special Requests   Final    BOTTLES DRAWN AEROBIC AND ANAEROBIC Blood Culture adequate volume Performed at [facility], [address] [place], [place] [address]    Culture   Final    NO GROWTH 5 DAYS Performed at [facility], [place] [address], [place], [place] [address]    Report Status 12/25/2020 FINAL  Final  Blood culture (routine x 2)     Status: Abnormal   Collection Time: 12/19/20 11:09 PM   Specimen: BLOOD RIGHT FOREARM  Result Value Ref Range Status   Specimen Description   Final    BLOOD RIGHT FOREARM Performed at [facility], [address] [place], [place] [address]    Special Requests   Final    BOTTLES DRAWN AEROBIC AND ANAEROBIC Blood Culture results may not be optimal due to an excessive volume of blood received in culture bottles Performed at [facility], [address] [place], [place] [address]    Culture  Setup Time   Final    GRAM POSITIVE COCCI AEROBIC BOTTLE ONLY CRITICAL VALUE NOTED.  VALUE IS CONSISTENT WITH PREVIOUSLY REPORTED AND CALLED VALUE.    Culture (A)  Final    STAPHYLOCOCCUS AUREUS SUSCEPTIBILITIES PERFORMED ON PREVIOUS CULTURE WITHIN THE LAST 5 DAYS. Performed at [facility], [place] [address], [place], [place] [address]    Report Status 12/25/2020 FINAL  Final  Resp Panel by RT-PCR (Flu A&B, Covid) Nasopharyngeal Swab      Status: None   Collection Time: 12/19/20 11:54 PM  Specimen: Nasopharyngeal Swab; Nasopharyngeal(NP) swabs in vial transport medium  Result Value Ref Range Status   SARS Coronavirus 2 by RT PCR NEGATIVE NEGATIVE Final    Comment: (NOTE) SARS-CoV-2 target nucleic acids are NOT DETECTED.  The SARS-CoV-2 RNA is generally detectable in upper respiratory specimens during the acute phase of infection. The lowest concentration of SARS-CoV-2 viral copies this assay can detect is 138 copies/mL. A negative result does not preclude SARS-Cov-2 infection and should not be used as the sole basis for treatment or other patient management decisions. A negative result may occur with  improper specimen collection/handling, submission of specimen other than nasopharyngeal swab, presence of viral mutation(s) within the areas targeted by this assay, and inadequate number of viral copies(<138 copies/mL). A negative result must be combined with clinical observations, patient history, and epidemiological information. The expected result is Negative.  Fact Sheet for Patients:  EntrepreneurPulse.com.au  Fact Sheet for Healthcare Providers:  IncredibleEmployment.be  This test is no t yet approved or cleared by the [place] FDA and  has been authorized for detection and/or diagnosis of SARS-CoV-2 by FDA under an Emergency Use Authorization (EUA). This EUA will remain  in effect (meaning this test can be used) for the duration of the COVID-19 declaration under Section 564(b)(1) of the Act, 21 U.S.C.section 360bbb-3(b)(1), unless the authorization is terminated  or revoked sooner.       Influenza A by PCR NEGATIVE NEGATIVE Final   Influenza B by PCR NEGATIVE NEGATIVE Final    Comment: (NOTE) The Xpert Xpress SARS-CoV-2/FLU/RSV plus assay is intended as an aid in the diagnosis of influenza from Nasopharyngeal swab specimens and should not be used as a sole basis  for treatment. Nasal washings and aspirates are unacceptable for Xpert Xpress SARS-CoV-2/FLU/RSV testing.  Fact Sheet for Patients: EntrepreneurPulse.com.au  Fact Sheet for Healthcare Providers: IncredibleEmployment.be  This test is not yet approved or cleared by the [place] FDA and has been authorized for detection and/or diagnosis of SARS-CoV-2 by FDA under an Emergency Use Authorization (EUA). This EUA will remain in effect (meaning this test can be used) for the duration of the COVID-19 declaration under Section 564(b)(1) of the Act, 21 U.S.C. section 360bbb-3(b)(1), unless the authorization is terminated or revoked.  Performed at [facility], [address] [place], [place] [address]   Culture, blood (single)     Status: Abnormal   Collection Time: 12/20/20  4:53 AM   Specimen: Left Antecubital; Blood  Result Value Ref Range Status   Specimen Description   Final    LEFT ANTECUBITAL Performed at [facility], [address] [place], [place] [address]    Special Requests   Final    BOTTLES DRAWN AEROBIC AND ANAEROBIC Blood Culture adequate volume Performed at [facility], [address] [place], [place] [address]    Culture  Setup Time   Final    IN BOTH AEROBIC AND ANAEROBIC BOTTLES GRAM POSITIVE COCCI Organism ID to follow CRITICAL RESULT CALLED TO, READ BACK BY AND VERIFIED WITH: C AMEND [facility] 12/21/20 0228 JDW Performed at [facility], [place] [address], [place], [place] [address]    Culture (A)  Final    METHICILLIN RESISTANT STAPHYLOCOCCUS AUREUS NONVIABLE  STAPHYLOCOCCUS EPIDERMIDIS    Report Status 12/24/2020 FINAL  Final   Organism ID, Bacteria METHICILLIN RESISTANT STAPHYLOCOCCUS AUREUS  Final      Susceptibility   Methicillin resistant staphylococcus aureus - MIC*  CIPROFLOXACIN >=8 RESISTANT Resistant     ERYTHROMYCIN >=8 RESISTANT Resistant     GENTAMICIN <=0.5  SENSITIVE Sensitive     OXACILLIN >=4 RESISTANT Resistant     TETRACYCLINE <=1 SENSITIVE Sensitive     VANCOMYCIN 1 SENSITIVE Sensitive     TRIMETH/SULFA >=320 RESISTANT Resistant     CLINDAMYCIN >=8 RESISTANT Resistant     RIFAMPIN <=0.5 SENSITIVE Sensitive     Inducible Clindamycin NEGATIVE Sensitive     * METHICILLIN RESISTANT STAPHYLOCOCCUS AUREUS  Blood Culture ID Panel (Reflexed)     Status: Abnormal   Collection Time: 12/20/20  4:53 AM  Result Value Ref Range Status   Enterococcus faecalis NOT DETECTED NOT DETECTED Final   Enterococcus Faecium NOT DETECTED NOT DETECTED Final   Listeria monocytogenes NOT DETECTED NOT DETECTED Final   Staphylococcus species DETECTED (A) NOT DETECTED Final    Comment: CRITICAL RESULT CALLED TO, READ BACK BY AND VERIFIED WITH: C AMEND PHARMD 12/21/20 0228 JDW    Staphylococcus aureus (BCID) DETECTED (A) NOT DETECTED Final    Comment: Methicillin (oxacillin)-resistant Staphylococcus aureus (MRSA). MRSA is predictably resistant to beta-lactam antibiotics (except ceftaroline). Preferred therapy is vancomycin unless clinically contraindicated. Patient requires contact precautions if  hospitalized. CRITICAL RESULT CALLED TO, READ BACK BY AND VERIFIED WITH: C AMEND PHARMD 12/21/20 0228 JDW    Staphylococcus epidermidis DETECTED (A) NOT DETECTED Final    Comment: CRITICAL RESULT CALLED TO, READ BACK BY AND VERIFIED WITH: C AMEND PHARMD 12/21/20 0228 JDW    Staphylococcus lugdunensis NOT DETECTED NOT DETECTED Final   Streptococcus species NOT DETECTED NOT DETECTED Final   Streptococcus agalactiae NOT DETECTED NOT DETECTED Final   Streptococcus pneumoniae NOT DETECTED NOT DETECTED Final   Streptococcus pyogenes NOT DETECTED NOT DETECTED Final   A.calcoaceticus-baumannii NOT DETECTED NOT DETECTED Final   Bacteroides fragilis NOT DETECTED NOT DETECTED Final   Enterobacterales NOT DETECTED NOT DETECTED Final   Enterobacter cloacae complex NOT DETECTED NOT  DETECTED Final   Escherichia coli NOT DETECTED NOT DETECTED Final   Klebsiella aerogenes NOT DETECTED NOT DETECTED Final   Klebsiella oxytoca NOT DETECTED NOT DETECTED Final   Klebsiella pneumoniae NOT DETECTED NOT DETECTED Final   Proteus species NOT DETECTED NOT DETECTED Final   Salmonella species NOT DETECTED NOT DETECTED Final   Serratia marcescens NOT DETECTED NOT DETECTED Final   Haemophilus influenzae NOT DETECTED NOT DETECTED Final   Neisseria meningitidis NOT DETECTED NOT DETECTED Final   Pseudomonas aeruginosa NOT DETECTED NOT DETECTED Final   Stenotrophomonas maltophilia NOT DETECTED NOT DETECTED Final   Candida albicans NOT DETECTED NOT DETECTED Final   Candida auris NOT DETECTED NOT DETECTED Final   Candida glabrata NOT DETECTED NOT DETECTED Final   Candida krusei NOT DETECTED NOT DETECTED Final   Candida parapsilosis NOT DETECTED NOT DETECTED Final   Candida tropicalis NOT DETECTED NOT DETECTED Final   Cryptococcus neoformans/gattii NOT DETECTED NOT DETECTED Final   Methicillin resistance mecA/C DETECTED (A) NOT DETECTED Final    Comment: CRITICAL RESULT CALLED TO, READ BACK BY AND VERIFIED WITH: C AMEND PHARMD 12/21/20 0228 JDW    Meth resistant mecA/C and MREJ DETECTED (A) NOT DETECTED Final    Comment: CRITICAL RESULT CALLED TO, READ BACK BY AND VERIFIED WITH: C AMEND PHARMD 12/21/20 0228 JDW Performed at [facility], [address] [place], [place] [address]   MRSA PCR Screening     Status: Abnormal   Collection Time: 12/20/20  6:15 PM   Specimen: Nasal  Mucosa; Nasopharyngeal  Result Value Ref Range Status   MRSA by PCR POSITIVE (A) NEGATIVE Final    Comment:        The GeneXpert MRSA Assay (FDA approved for NASAL specimens only), is one component of a comprehensive MRSA colonization surveillance program. It is not intended to diagnose MRSA infection nor to guide or monitor treatment for MRSA infections. RESULT CALLED TO, READ BACK BY AND VERIFIED  WITH: RN T. TIE [id number] 2010 FCP Performed at [place] [address], [place], [place] [address]   Culture, blood (routine x 2)     Status: None   Collection Time: 12/22/20  6:46 AM   Specimen: BLOOD RIGHT WRIST  Result Value Ref Range Status   Specimen Description BLOOD RIGHT WRIST  Final   Special Requests   Final    BOTTLES DRAWN AEROBIC ONLY Blood Culture adequate volume   Culture   Final    NO GROWTH 5 DAYS Performed at [facility], [address] [place], [place] [address]    Report Status 12/27/2020 FINAL  Final  Culture, blood (Routine X 2) w Reflex to ID Panel     Status: None   Collection Time: 12/22/20  9:11 AM   Specimen: BLOOD  Result Value Ref Range Status   Specimen Description BLOOD BLOOD LEFT WRIST  Final   Special Requests   Final    BOTTLES DRAWN AEROBIC ONLY Blood Culture adequate volume   Culture   Final    NO GROWTH 5 DAYS Performed at [facility], [place] [address] [place], [place] [address]    Report Status 12/27/2020 FINAL  Final     Radiology Studies: [place] EKG SITE RITE  Result Date: 12/26/2020 If Site Rite image not attached, placement could not be confirmed due to current cardiac rhythm.   Scheduled Meds: . Chlorhexidine Gluconate Cloth  6 each Topical Daily  . enoxaparin (LOVENOX) injection  40 mg Subcutaneous Q24H  . nicotine  14 mg Transdermal Daily  . sodium chloride flush  10-40 mL Intracatheter Q12H  . sodium chloride flush  3 mL Intravenous Q12H   Continuous Infusions: . sodium chloride 10 mL/hr at 12/20/20 2115  . vancomycin 1,250 mg (12/27/20 0511)     LOS: 7 days   Marylu Lund, MD [facility] Pager On Amion  If 7PM-7AM, please contact night-coverage 12/27/2020, 2:48 PM

## 2020-12-27 NOTE — Progress Notes (Addendum)
INFECTIOUS DISEASE PROGRESS NOTE  ID: [name] Radwan is a 29 y.o. female with  Principal Problem:   Sepsis (HCC) Active Problems:   Hepatitis C, chronic, maternal, antepartum (HCC)   Cigarette smoker   Polysubstance dependence including opioid type drug without complication, episodic abuse (HCC)   Endocarditis   Rash   Bacteremia  Subjective: Resting quietly, awakens easily No complaints  Abtx:  Anti-infectives (From admission, onward)   Start     Dose/Rate Route Frequency Ordered Stop   12/24/20 0600  vancomycin (VANCOREADY) IVPB 1250 mg/250 mL        1,250 mg 166.7 mL/hr over 90 Minutes Intravenous Every 12 hours 12/23/20 1723     12/23/20 1800  vancomycin (VANCOREADY) IVPB 500 mg/100 mL        500 mg 100 mL/hr over 60 Minutes Intravenous  Once 12/23/20 1724 12/23/20 1851   12/21/20 1600  vancomycin (VANCOREADY) IVPB 750 mg/150 mL  Status:  Discontinued        750 mg 150 mL/hr over 60 Minutes Intravenous Every 12 hours 12/21/20 0249 12/23/20 1723   12/21/20 0300  vancomycin (VANCOCIN) IVPB 1000 mg/200 mL premix        1,000 mg 200 mL/hr over 60 Minutes Intravenous STAT 12/21/20 0249 12/21/20 0711   12/20/20 0600  ceFAZolin (ANCEF) IVPB 1 g/50 mL premix  Status:  Discontinued        1 g 100 mL/hr over 30 Minutes Intravenous Every 8 hours 12/19/20 2254 12/21/20 0242      Medications:  Scheduled: . enoxaparin (LOVENOX) injection  40 mg Subcutaneous Q24H  . nicotine  14 mg Transdermal Daily  . sodium chloride flush  3 mL Intravenous Q12H    Objective: Vital signs in last 24 hours: Temp:  [97.6 F (36.4 C)-98.2 F (36.8 C)] 97.6 F (36.4 C) (12/31 0759) Pulse Rate:  [79-98] 79 (12/31 0759) Resp:  [15-17] 16 (12/31 0420) BP: (91-133)/(54-69) 92/58 (12/31 0759) SpO2:  [95 %-100 %] 95 % (12/31 0420)   General appearance: cooperative and no distress  Lab Results Recent Labs    12/25/20 0136 12/26/20 0530 12/27/20 0015  WBC 6.7 6.5 7.5  HGB 11.8* 11.8*  11.8*  HCT 36.8 35.1* 35.4*  NA 137 135  --   K 4.2 4.1  --   CL 104 101  --   CO2 25 26  --   BUN 9 8  --   CREATININE 0.79 0.82  --    Liver Panel Recent Labs    12/26/20 0530  PROT 6.4*  ALBUMIN 3.0*  AST 70*  ALT 90*  ALKPHOS 56  BILITOT 0.4   Sedimentation Rate No results for input(s): ESRSEDRATE in the last 72 hours. C-Reactive Protein No results for input(s): CRP in the last 72 hours.  Microbiology: Recent Results (from the past 240 hour(s))  Blood culture (routine x 2)     Status: None   Collection Time: 12/19/20 11:00 PM   Specimen: Left Antecubital; Blood  Result Value Ref Range Status   Specimen Description   Final    LEFT ANTECUBITAL Performed at [facility] [place], [address] [place], [place] [address]    Special Requests   Final    BOTTLES DRAWN AEROBIC AND ANAEROBIC Blood Culture adequate volume Performed at [facility] [place] [facility], [address] [place], [place] [address]    Culture   Final    NO GROWTH 5 DAYS Performed at [facility],  [address] [place], [place] [address]    Report Status 12/25/2020 FINAL  Final  Blood culture (routine x 2)     Status: Abnormal   Collection Time: 12/19/20 11:09 PM   Specimen: BLOOD RIGHT FOREARM  Result Value Ref Range Status   Specimen Description   Final    BLOOD RIGHT FOREARM Performed at [facility], [address] [place], [place] [address]    Special Requests   Final    BOTTLES DRAWN AEROBIC AND ANAEROBIC Blood Culture results may not be optimal due to an excessive volume of blood received in culture bottles Performed at [facility], [address] [place], [place] [address]    Culture  Setup Time   Final    GRAM POSITIVE COCCI AEROBIC BOTTLE ONLY CRITICAL VALUE NOTED.  VALUE IS CONSISTENT WITH PREVIOUSLY REPORTED AND CALLED VALUE.    Culture (A)  Final    STAPHYLOCOCCUS AUREUS SUSCEPTIBILITIES PERFORMED ON PREVIOUS CULTURE WITHIN THE LAST 5  DAYS. Performed at [facility], [address] [place], [place] [address]    Report Status 12/25/2020 FINAL  Final  Resp Panel by RT-PCR (Flu A&B, Covid) Nasopharyngeal Swab     Status: None   Collection Time: 12/19/20 11:54 PM   Specimen: Nasopharyngeal Swab; Nasopharyngeal(NP) swabs in vial transport medium  Result Value Ref Range Status   SARS Coronavirus 2 by RT PCR NEGATIVE NEGATIVE Final    Comment: (NOTE) SARS-CoV-2 target nucleic acids are NOT DETECTED.  The SARS-CoV-2 RNA is generally detectable in upper respiratory specimens during the acute phase of infection. The lowest concentration of SARS-CoV-2 viral copies this assay can detect is 138 copies/mL. A negative result does not preclude SARS-Cov-2 infection and should not be used as the sole basis for treatment or other patient management decisions. A negative result may occur with  improper specimen collection/handling, submission of specimen other than nasopharyngeal swab, presence of viral mutation(s) within the areas targeted by this assay, and inadequate number of viral copies(<138 copies/mL). A negative result must be combined with clinical observations, patient history, and epidemiological information. The expected result is Negative.  Fact Sheet for Patients:  BloggerCourse.com  Fact Sheet for Healthcare Providers:  SeriousBroker.it  This test is no t yet approved or cleared by the [place] FDA and  has been authorized for detection and/or diagnosis of SARS-CoV-2 by FDA under an Emergency Use Authorization (EUA). This EUA will remain  in effect (meaning this test can be used) for the duration of the COVID-19 declaration under Section 564(b)(1) of the Act, 21 U.S.C.section 360bbb-3(b)(1), unless the authorization is terminated  or revoked sooner.       Influenza A by PCR NEGATIVE NEGATIVE Final   Influenza B by PCR NEGATIVE NEGATIVE Final     Comment: (NOTE) The Xpert Xpress SARS-CoV-2/FLU/RSV plus assay is intended as an aid in the diagnosis of influenza from Nasopharyngeal swab specimens and should not be used as a sole basis for treatment. Nasal washings and aspirates are unacceptable for Xpert Xpress SARS-CoV-2/FLU/RSV testing.  Fact Sheet for Patients: BloggerCourse.com  Fact Sheet for Healthcare Providers: SeriousBroker.it  This test is not yet approved or cleared by the [place] FDA and has been authorized for detection and/or diagnosis of SARS-CoV-2 by FDA under an Emergency Use Authorization (EUA). This EUA will remain in effect (meaning this test can be used) for the duration of the COVID-19 declaration under Section 564(b)(1) of the Act, 21 U.S.C.  section 360bbb-3(b)(1), unless the authorization is terminated or revoked.  Performed at [facility], [address] [place], [place] [address]   Culture, blood (single)     Status: Abnormal   Collection Time: 12/20/20  4:53 AM   Specimen: Left Antecubital; Blood  Result Value Ref Range Status   Specimen Description   Final    LEFT ANTECUBITAL Performed at [facility], [address] [place], [place] [address]    Special Requests   Final    BOTTLES DRAWN AEROBIC AND ANAEROBIC Blood Culture adequate volume Performed at [facility], [address] [place], [place] [address]    Culture  Setup Time   Final    IN BOTH AEROBIC AND ANAEROBIC BOTTLES GRAM POSITIVE COCCI Organism ID to follow CRITICAL RESULT CALLED TO, READ BACK BY AND VERIFIED WITH: C AMEND [facility] 12/21/20 0228 JDW Performed at [facility], [address] [place], [place] [address]    Culture (A)  Final    METHICILLIN RESISTANT STAPHYLOCOCCUS AUREUS NONVIABLE  STAPHYLOCOCCUS EPIDERMIDIS    Report Status 12/24/2020 FINAL  Final   Organism ID, Bacteria METHICILLIN RESISTANT STAPHYLOCOCCUS AUREUS   Final      Susceptibility   Methicillin resistant staphylococcus aureus - MIC*    CIPROFLOXACIN >=8 RESISTANT Resistant     ERYTHROMYCIN >=8 RESISTANT Resistant     GENTAMICIN <=0.5 SENSITIVE Sensitive     OXACILLIN >=4 RESISTANT Resistant     TETRACYCLINE <=1 SENSITIVE Sensitive     VANCOMYCIN 1 SENSITIVE Sensitive     TRIMETH/SULFA >=320 RESISTANT Resistant     CLINDAMYCIN >=8 RESISTANT Resistant     RIFAMPIN <=0.5 SENSITIVE Sensitive     Inducible Clindamycin NEGATIVE Sensitive     * METHICILLIN RESISTANT STAPHYLOCOCCUS AUREUS  Blood Culture ID Panel (Reflexed)     Status: Abnormal   Collection Time: 12/20/20  4:53 AM  Result Value Ref Range Status   Enterococcus faecalis NOT DETECTED NOT DETECTED Final   Enterococcus Faecium NOT DETECTED NOT DETECTED Final   Listeria monocytogenes NOT DETECTED NOT DETECTED Final   Staphylococcus species DETECTED (A) NOT DETECTED Final    Comment: CRITICAL RESULT CALLED TO, READ BACK BY AND VERIFIED WITH: C AMEND PHARMD 12/21/20 0228 JDW    Staphylococcus aureus (BCID) DETECTED (A) NOT DETECTED Final    Comment: Methicillin (oxacillin)-resistant Staphylococcus aureus (MRSA). MRSA is predictably resistant to beta-lactam antibiotics (except ceftaroline). Preferred therapy is vancomycin unless clinically contraindicated. Patient requires contact precautions if  hospitalized. CRITICAL RESULT CALLED TO, READ BACK BY AND VERIFIED WITH: C AMEND PHARMD 12/21/20 0228 JDW    Staphylococcus epidermidis DETECTED (A) NOT DETECTED Final    Comment: CRITICAL RESULT CALLED TO, READ BACK BY AND VERIFIED WITH: C AMEND PHARMD 12/21/20 0228 JDW    Staphylococcus lugdunensis NOT DETECTED NOT DETECTED Final   Streptococcus species NOT DETECTED NOT DETECTED Final   Streptococcus agalactiae NOT DETECTED NOT DETECTED Final   Streptococcus pneumoniae NOT DETECTED NOT DETECTED Final   Streptococcus pyogenes NOT DETECTED NOT DETECTED Final   A.calcoaceticus-baumannii  NOT DETECTED NOT DETECTED Final   Bacteroides fragilis NOT DETECTED NOT DETECTED Final   Enterobacterales NOT DETECTED NOT DETECTED Final   Enterobacter cloacae complex NOT DETECTED NOT DETECTED Final   Escherichia coli NOT DETECTED NOT DETECTED Final   Klebsiella aerogenes NOT DETECTED NOT DETECTED Final   Klebsiella oxytoca NOT DETECTED NOT DETECTED Final   Klebsiella pneumoniae NOT DETECTED NOT DETECTED Final  Proteus species NOT DETECTED NOT DETECTED Final   Salmonella species NOT DETECTED NOT DETECTED Final   Serratia marcescens NOT DETECTED NOT DETECTED Final   Haemophilus influenzae NOT DETECTED NOT DETECTED Final   Neisseria meningitidis NOT DETECTED NOT DETECTED Final   Pseudomonas aeruginosa NOT DETECTED NOT DETECTED Final   Stenotrophomonas maltophilia NOT DETECTED NOT DETECTED Final   Candida albicans NOT DETECTED NOT DETECTED Final   Candida auris NOT DETECTED NOT DETECTED Final   Candida glabrata NOT DETECTED NOT DETECTED Final   Candida krusei NOT DETECTED NOT DETECTED Final   Candida parapsilosis NOT DETECTED NOT DETECTED Final   Candida tropicalis NOT DETECTED NOT DETECTED Final   Cryptococcus neoformans/gattii NOT DETECTED NOT DETECTED Final   Methicillin resistance mecA/C DETECTED (A) NOT DETECTED Final    Comment: CRITICAL RESULT CALLED TO, READ BACK BY AND VERIFIED WITH: C AMEND PHARMD 12/21/20 0228 JDW    Meth resistant mecA/C and MREJ DETECTED (A) NOT DETECTED Final    Comment: CRITICAL RESULT CALLED TO, READ BACK BY AND VERIFIED WITH: C AMEND PHARMD 12/21/20 0228 JDW Performed at [facility], [address] [place], [place] [address]   MRSA PCR Screening     Status: Abnormal   Collection Time: 12/20/20  6:15 PM   Specimen: Nasal Mucosa; Nasopharyngeal  Result Value Ref Range Status   MRSA by PCR POSITIVE (A) NEGATIVE Final    Comment:        The GeneXpert MRSA Assay (FDA approved for NASAL specimens only), is one component of a comprehensive  MRSA colonization surveillance program. It is not intended to diagnose MRSA infection nor to guide or monitor treatment for MRSA infections. RESULT CALLED TO, READ BACK BY AND VERIFIED WITH: RN T. TIE [id number] FCP Performed at [facility], [address] [place], [place] [address]   Culture, blood (routine x 2)     Status: None   Collection Time: 12/22/20  6:46 AM   Specimen: BLOOD RIGHT WRIST  Result Value Ref Range Status   Specimen Description BLOOD RIGHT WRIST  Final   Special Requests   Final    BOTTLES DRAWN AEROBIC ONLY Blood Culture adequate volume   Culture   Final    NO GROWTH 5 DAYS Performed at [facility], [address], [place], [place] [address]    Report Status 12/27/2020 FINAL  Final  Culture, blood (Routine X 2) w Reflex to ID Panel     Status: None   Collection Time: 12/22/20  9:11 AM   Specimen: BLOOD  Result Value Ref Range Status   Specimen Description BLOOD BLOOD LEFT WRIST  Final   Special Requests   Final    BOTTLES DRAWN AEROBIC ONLY Blood Culture adequate volume   Culture   Final    NO GROWTH 5 DAYS Performed at [facility], [address], [place], [place] [address]    Report Status 12/27/2020 FINAL  Final    Studies/Results: [place] EKG SITE RITE  Result Date: 12/26/2020 If Site Rite image not attached, placement could not be confirmed due to current cardiac rhythm.    Assessment/Plan: IVDA MRSA bacteremia Hepatitis C  Total days of antibiotics: 6 vancomycin  Repeat BCx 12-26 (-) Her Hep C RNA was (-) on 12-26. I am doubtful that this is accurate as she prev had VL in the 100s of thousands. She denies treatment.  I have re-ordered this.  She can f/u in ID clinic for  her bacteremia and potential Hep C treatment.  Will order Hep A and B vaccines.  elastogram can not be done inpt. Her prev u/s from 04-2020 was (-) for strutural abn  Her TEE was (-). Given her prev IE and ongoing IVDA, she is at very high risk. I  would favor 1 month of vanco or at a minimum 14 days of vanco with either 14 days of bactrim to follow or 1 injection of dalbavancin (or oritavancin) as she leaves the hospital.  Available as needed.   Johny Sax MD, FACP Infectious Diseases (pager) [phone] [email or web address] 12/27/2020, 10:01 AM  LOS: 7 days

## 2020-12-28 DIAGNOSIS — B1710 Acute hepatitis C without hepatic coma: Secondary | ICD-10-CM | POA: Diagnosis not present

## 2020-12-28 DIAGNOSIS — F17210 Nicotine dependence, cigarettes, uncomplicated: Secondary | ICD-10-CM | POA: Diagnosis not present

## 2020-12-28 LAB — CBC WITH DIFFERENTIAL/PLATELET
Abs Immature Granulocytes: 0.03 10*3/uL (ref 0.00–0.07)
Basophils Absolute: 0 10*3/uL (ref 0.0–0.1)
Basophils Relative: 0 %
Eosinophils Absolute: 0.2 10*3/uL (ref 0.0–0.5)
Eosinophils Relative: 3 %
HCT: 33.9 % — ABNORMAL LOW (ref 36.0–46.0)
Hemoglobin: 10.9 g/dL — ABNORMAL LOW (ref 12.0–15.0)
Immature Granulocytes: 0 %
Lymphocytes Relative: 39 %
Lymphs Abs: 2.7 10*3/uL (ref 0.7–4.0)
MCH: 30.7 pg (ref 26.0–34.0)
MCHC: 32.2 g/dL (ref 30.0–36.0)
MCV: 95.5 fL (ref 80.0–100.0)
Monocytes Absolute: 0.7 10*3/uL (ref 0.1–1.0)
Monocytes Relative: 10 %
Neutro Abs: 3.3 10*3/uL (ref 1.7–7.7)
Neutrophils Relative %: 48 %
Platelets: 376 10*3/uL (ref 150–400)
RBC: 3.55 MIL/uL — ABNORMAL LOW (ref 3.87–5.11)
RDW: 12.4 % (ref 11.5–15.5)
WBC: 6.9 10*3/uL (ref 4.0–10.5)
nRBC: 0 % (ref 0.0–0.2)

## 2020-12-28 LAB — HCV RNA QUANT
HCV Quantitative Log: 5.559 log10 IU/mL (ref >1.70)
HCV Quantitative: 362000 IU/mL (ref >50)

## 2020-12-28 NOTE — Progress Notes (Signed)
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 She has had similar symptoms like this previously in the past. Current episode started about 4 days ago. Initially rash started out as little pimples that sometimes pop on their own or develop into abscesses. Denied any significant fever, chills, chest pain, cough, shortness of breath, leg swelling, abdominal pain, or blood in stool. Last admitted into the hospital in May of this year for septic shock with tricuspid valve endocarditis related to strep pyrogens bacteremia. she only stayed in hospital couple days prior to leaving [place] and never followed up with any provider. She claims to be clean from IV drugs for 120 days after getting out of jail on August 19.  Upon admission into the emergency department patient was hemodynamically stable other than mild tachycardia.  Urine drug screen was positive for amphetamines however she claims that she takes Adderall which is prescribed. Chest x-ray showed no acute abnormalities. ID consulted and recommended checking 3 sets of blood cultures starting cefazolin.  Blood culture growing MRSA and staph epidermidis. Repeat blood culture from 12/22/2020 - thus far.  TEE negative for any vegetations.  Assessment & Plan:   Principal Problem:   Sepsis ([place]) Active Problems:   Hepatitis C, chronic, maternal, antepartum ([place])   Cigarette smoker   Polysubstance dependence including opioid type drug without complication, episodic abuse ([place])   Endocarditis   Rash   Bacteremia   Sepsis secondary to MRSA bacteremia without endocarditis: - Patient  met sepsis criteria due to tachycardic with white blood cell count elevated up to 12.5 meeting SIRS criteria. Previously positive for group A strep (strep pyogenes) when admitted back in May with tricuspid valve endocarditis, but left AMA prior to receiving full recommended treatment. CRP was elevated at 6.3 and sedimentation rate was 40. ID is on board.   -Her repeat transthoracic echo does not show any endocarditis, Blood culture growing MRSA and staph epidermidis. Repeat blood culture from 12/22/2020 - thus far.   -Status post TEE which does not show any endocarditis.   -ID had been following. Recommendation for PICC placement with abx through 01/19/21.Presently stable  Unknown skin lesions, possibly carbuncles:.  -Patient presents with complaints of multiple skin ulcerations mostly concentrated on the face and neck.   -Worked up by ID, RPR and treponema neg  Asymptomatic bacteriuria:  -Cont to hold off on antibiotics specific for UTI at this time  Hyponatremia:  -Normalized  Elevated transaminases  history of chronic hepatitis C: Acute.   - LFTs overll remaining stable  Polysubstance abuse:  -Patient reportedly was clean from IV drug use for the last 120 days. Urine drug screen was positive for amphetamines however pt reports taking Adderall. Prior history of abuse also includes heroin, cocaine, and marijuana. -Continue to monitor for withdrawal symptoms.  Ativan IV if needed -Cessation done this visit  Mild left preseptal cellulitis: ID was concerned about possible osteomyelitis.  Patient has no visual symptoms or deep pain in the left eye.  Per ID recommendation, ophthalmology was consulted and pt is currently treated for preseptal cellulitis on current abx.  Patient continues to improve every day.  No problem with vision.  Mild AKI: Secondary to sepsis.  Resolved. Cont to follow bmet trends  DVT prophylaxis: Lovenox subq Code Status: Full Family Communication: Pt in room,  family not at bedside  Status is: Inpatient  Remains inpatient appropriate because:IV treatments appropriate due to intensity of illness or inability to take PO   Dispo: The patient is from: Home              Anticipated d/c is to: Home              Anticipated d/c date is: > 3 days after completing abx, last dose 1/23              Patient currently is not medically stable to d/c.  Consultants:   ID  Cardiology  Procedures:   TEE, neg for vegetations  Antimicrobials: Anti-infectives (From admission, onward)   Start     Dose/Rate Route Frequency Ordered Stop   12/24/20 0600  vancomycin (VANCOREADY) IVPB 1250 mg/250 mL        1,250 mg 166.7 mL/hr over 90 Minutes Intravenous Every 12 hours 12/23/20 1723     12/23/20 1800  vancomycin (VANCOREADY) IVPB 500 mg/100 mL        500 mg 100 mL/hr over 60 Minutes Intravenous  Once 12/23/20 1724 12/23/20 1851   12/21/20 1600  vancomycin (VANCOREADY) IVPB 750 mg/150 mL  Status:  Discontinued        750 mg 150 mL/hr over 60 Minutes Intravenous Every 12 hours 12/21/20 0249 12/23/20 1723   12/21/20 0300  vancomycin (VANCOCIN) IVPB 1000 mg/200 mL premix        1,000 mg 200 mL/hr over 60 Minutes Intravenous STAT 12/21/20 0249 12/21/20 0711   12/20/20 0600  ceFAZolin (ANCEF) IVPB 1 g/50 mL premix  Status:  Discontinued        1 g 100 mL/hr over 30 Minutes Intravenous Every 8 hours 12/19/20 2254 12/21/20 0242      Subjective: No complaints at this time  Objective: Vitals:   12/27/20 2349 12/28/20 0354 12/28/20 0814 12/28/20 1128  BP: 99/65 (!) '98/58 95/62 92/60 '  Pulse: 93 100 88 89  Resp: '16 16 18 20  ' Temp: 98.2 F (36.8 C) 98.3 F (36.8 C) 97.9 F (36.6 C) 98.1 F (36.7 C)  TempSrc: Oral Oral Oral Oral  SpO2: 98% 97% 96% 97%  Weight:      Height:        Intake/Output Summary (Last 24 hours) at 12/28/2020 1443 Last data filed at 12/28/2020 0819 Gross per 24 hour  Intake 360 ml  Output -  Net 360 ml   Filed Weights    12/19/20 1634  Weight: 56.2 kg    Examination: General exam: Awake, laying in bed, in nad Respiratory system: Normal respiratory effort, no wheezing  Data Reviewed: I have personally reviewed following labs and imaging studies  CBC: Recent Labs  Lab 12/24/20 0251 12/25/20 0136 12/26/20 0530 12/27/20 0015 12/28/20 0538  WBC 6.0 6.7 6.5 7.5 6.9  NEUTROABS 2.6 3.0 2.8 3.4 3.3  HGB 12.0 11.8* 11.8* 11.8* 10.9*  HCT 34.8* 36.8 35.1* 35.4* 33.9*  MCV 92.3 95.8 94.1 94.7 95.5  PLT 354 357 360 292 947   Basic Metabolic Panel: Recent Labs  Lab 12/22/20 0640 12/23/20 0349 12/25/20 0136 12/26/20 0530  NA 135 134* 137 135  K 4.2 4.0 4.2 4.1  CL 104 100 104 101  CO2 20* '23 25 26  ' GLUCOSE 98 132* 98 97  BUN '10 9 9 8  ' CREATININE 0.77 0.96 0.79  0.82  CALCIUM 8.4* 8.8* 8.9 9.4  MG  --  1.9  --   --    GFR: Estimated Creatinine Clearance: 78.9 mL/min (by C-G formula based on SCr of 0.82 mg/dL). Liver Function Tests: Recent Labs  Lab 12/22/20 0640 12/23/20 0349 12/26/20 0530  AST 37 45* 70*  ALT 43 50* 90*  ALKPHOS 53 57 56  BILITOT 0.4 0.4 0.4  PROT 6.1* 6.4* 6.4*  ALBUMIN 3.0* 3.0* 3.0*   No results for input(s): LIPASE, AMYLASE in the last 168 hours. No results for input(s): AMMONIA in the last 168 hours. Coagulation Profile: No results for input(s): INR, PROTIME in the last 168 hours. Cardiac Enzymes: No results for input(s): CKTOTAL, CKMB, CKMBINDEX, TROPONINI in the last 168 hours. BNP (last 3 results) No results for input(s): PROBNP in the last 8760 hours. HbA1C: No results for input(s): HGBA1C in the last 72 hours. CBG: No results for input(s): GLUCAP in the last 168 hours. Lipid Profile: No results for input(s): CHOL, HDL, LDLCALC, TRIG, CHOLHDL, LDLDIRECT in the last 72 hours. Thyroid Function Tests: No results for input(s): TSH, T4TOTAL, FREET4, T3FREE, THYROIDAB in the last 72 hours. Anemia Panel: No results for input(s): VITAMINB12, FOLATE,  FERRITIN, TIBC, IRON, RETICCTPCT in the last 72 hours. Sepsis Labs: No results for input(s): PROCALCITON, LATICACIDVEN in the last 168 hours.  Recent Results (from the past 240 hour(s))  Blood culture (routine x 2)     Status: None   Collection Time: 12/19/20 11:00 PM   Specimen: Left Antecubital; Blood  Result Value Ref Range Status   Specimen Description   Final    LEFT ANTECUBITAL Performed at [facility], [address] [place], [place] [address]    Special Requests   Final    BOTTLES DRAWN AEROBIC AND ANAEROBIC Blood Culture adequate volume Performed at [facility], [address] [place], [place] [address]    Culture   Final    NO GROWTH 5 DAYS Performed at [facility], [place] [address], [place], [place] [address]    Report Status 12/25/2020 FINAL  Final  Blood culture (routine x 2)     Status: Abnormal   Collection Time: 12/19/20 11:09 PM   Specimen: BLOOD RIGHT FOREARM  Result Value Ref Range Status   Specimen Description   Final    BLOOD RIGHT FOREARM Performed at [facility], [address] [place], [place] [address]    Special Requests   Final    BOTTLES DRAWN AEROBIC AND ANAEROBIC Blood Culture results may not be optimal due to an excessive volume of blood received in culture bottles Performed at [facility], [address] [place], [place] [address]    Culture  Setup Time   Final    GRAM POSITIVE COCCI AEROBIC BOTTLE ONLY CRITICAL VALUE NOTED.  VALUE IS CONSISTENT WITH PREVIOUSLY REPORTED AND CALLED VALUE.    Culture (A)  Final    STAPHYLOCOCCUS AUREUS SUSCEPTIBILITIES PERFORMED ON PREVIOUS CULTURE WITHIN THE LAST 5 DAYS. Performed at [facility], [place] [address] [place], [place] [address]    Report Status 12/25/2020 FINAL  Final  Resp Panel by RT-PCR (Flu A&B, Covid) Nasopharyngeal Swab     Status: None   Collection Time: 12/19/20 11:54 PM   Specimen: Nasopharyngeal Swab; Nasopharyngeal(NP)  swabs in vial transport medium  Result Value Ref Range Status   SARS Coronavirus 2 by RT PCR NEGATIVE NEGATIVE Final  Comment: (NOTE) SARS-CoV-2 target nucleic acids are NOT DETECTED.  The SARS-CoV-2 RNA is generally detectable in upper respiratory specimens during the acute phase of infection. The lowest concentration of SARS-CoV-2 viral copies this assay can detect is 138 copies/mL. A negative result does not preclude SARS-Cov-2 infection and should not be used as the sole basis for treatment or other patient management decisions. A negative result may occur with  improper specimen collection/handling, submission of specimen other than nasopharyngeal swab, presence of viral mutation(s) within the areas targeted by this assay, and inadequate number of viral copies(<138 copies/mL). A negative result must be combined with clinical observations, patient history, and epidemiological information. The expected result is Negative.  Fact Sheet for Patients:  EntrepreneurPulse.com.au  Fact Sheet for Healthcare Providers:  IncredibleEmployment.be  This test is no t yet approved or cleared by the [place] FDA and  has been authorized for detection and/or diagnosis of SARS-CoV-2 by FDA under an Emergency Use Authorization (EUA). This EUA will remain  in effect (meaning this test can be used) for the duration of the COVID-19 declaration under Section 564(b)(1) of the Act, 21 U.S.C.section 360bbb-3(b)(1), unless the authorization is terminated  or revoked sooner.       Influenza A by PCR NEGATIVE NEGATIVE Final   Influenza B by PCR NEGATIVE NEGATIVE Final    Comment: (NOTE) The Xpert Xpress SARS-CoV-2/FLU/RSV plus assay is intended as an aid in the diagnosis of influenza from Nasopharyngeal swab specimens and should not be used as a sole basis for treatment. Nasal washings and aspirates are unacceptable for Xpert Xpress  SARS-CoV-2/FLU/RSV testing.  Fact Sheet for Patients: EntrepreneurPulse.com.au  Fact Sheet for Healthcare Providers: IncredibleEmployment.be  This test is not yet approved or cleared by the [place] FDA and has been authorized for detection and/or diagnosis of SARS-CoV-2 by FDA under an Emergency Use Authorization (EUA). This EUA will remain in effect (meaning this test can be used) for the duration of the COVID-19 declaration under Section 564(b)(1) of the Act, 21 U.S.C. section 360bbb-3(b)(1), unless the authorization is terminated or revoked.  Performed at [facility], [address], [place] [address]   Culture, blood (single)     Status: Abnormal   Collection Time: 12/20/20  4:53 AM   Specimen: Left Antecubital; Blood  Result Value Ref Range Status   Specimen Description   Final    LEFT ANTECUBITAL Performed at [facility], [address] [place], [place] [address]    Special Requests   Final    BOTTLES DRAWN AEROBIC AND ANAEROBIC Blood Culture adequate volume Performed at [facility], [address] [place], [place] [address]    Culture  Setup Time   Final    IN BOTH AEROBIC AND ANAEROBIC BOTTLES GRAM POSITIVE COCCI Organism ID to follow CRITICAL RESULT CALLED TO, READ BACK BY AND VERIFIED WITH: C AMEND [facility] 12/21/20 0228 JDW Performed at [facility], [place] [address] [place], [place] [address]    Culture (A)  Final    METHICILLIN RESISTANT STAPHYLOCOCCUS AUREUS NONVIABLE  STAPHYLOCOCCUS EPIDERMIDIS    Report Status 12/24/2020 FINAL  Final   Organism ID, Bacteria METHICILLIN RESISTANT STAPHYLOCOCCUS AUREUS  Final      Susceptibility   Methicillin resistant staphylococcus aureus - MIC*    CIPROFLOXACIN >=8 RESISTANT Resistant     ERYTHROMYCIN >=8 RESISTANT Resistant     GENTAMICIN <=0.5 SENSITIVE Sensitive     OXACILLIN >=4 RESISTANT Resistant  TETRACYCLINE <=1  SENSITIVE Sensitive     VANCOMYCIN 1 SENSITIVE Sensitive     TRIMETH/SULFA >=320 RESISTANT Resistant     CLINDAMYCIN >=8 RESISTANT Resistant     RIFAMPIN <=0.5 SENSITIVE Sensitive     Inducible Clindamycin NEGATIVE Sensitive     * METHICILLIN RESISTANT STAPHYLOCOCCUS AUREUS  Blood Culture ID Panel (Reflexed)     Status: Abnormal   Collection Time: 12/20/20  4:53 AM  Result Value Ref Range Status   Enterococcus faecalis NOT DETECTED NOT DETECTED Final   Enterococcus Faecium NOT DETECTED NOT DETECTED Final   Listeria monocytogenes NOT DETECTED NOT DETECTED Final   Staphylococcus species DETECTED (A) NOT DETECTED Final    Comment: CRITICAL RESULT CALLED TO, READ BACK BY AND VERIFIED WITH: C AMEND PHARMD 12/21/20 0228 JDW    Staphylococcus aureus (BCID) DETECTED (A) NOT DETECTED Final    Comment: Methicillin (oxacillin)-resistant Staphylococcus aureus (MRSA). MRSA is predictably resistant to beta-lactam antibiotics (except ceftaroline). Preferred therapy is vancomycin unless clinically contraindicated. Patient requires contact precautions if  hospitalized. CRITICAL RESULT CALLED TO, READ BACK BY AND VERIFIED WITH: C AMEND PHARMD 12/21/20 0228 JDW    Staphylococcus epidermidis DETECTED (A) NOT DETECTED Final    Comment: CRITICAL RESULT CALLED TO, READ BACK BY AND VERIFIED WITH: C AMEND PHARMD 12/21/20 0228 JDW    Staphylococcus lugdunensis NOT DETECTED NOT DETECTED Final   Streptococcus species NOT DETECTED NOT DETECTED Final   Streptococcus agalactiae NOT DETECTED NOT DETECTED Final   Streptococcus pneumoniae NOT DETECTED NOT DETECTED Final   Streptococcus pyogenes NOT DETECTED NOT DETECTED Final   A.calcoaceticus-baumannii NOT DETECTED NOT DETECTED Final   Bacteroides fragilis NOT DETECTED NOT DETECTED Final   Enterobacterales NOT DETECTED NOT DETECTED Final   Enterobacter cloacae complex NOT DETECTED NOT DETECTED Final   Escherichia coli NOT DETECTED NOT DETECTED Final   Klebsiella  aerogenes NOT DETECTED NOT DETECTED Final   Klebsiella oxytoca NOT DETECTED NOT DETECTED Final   Klebsiella pneumoniae NOT DETECTED NOT DETECTED Final   Proteus species NOT DETECTED NOT DETECTED Final   Salmonella species NOT DETECTED NOT DETECTED Final   Serratia marcescens NOT DETECTED NOT DETECTED Final   Haemophilus influenzae NOT DETECTED NOT DETECTED Final   Neisseria meningitidis NOT DETECTED NOT DETECTED Final   Pseudomonas aeruginosa NOT DETECTED NOT DETECTED Final   Stenotrophomonas maltophilia NOT DETECTED NOT DETECTED Final   Candida albicans NOT DETECTED NOT DETECTED Final   Candida auris NOT DETECTED NOT DETECTED Final   Candida glabrata NOT DETECTED NOT DETECTED Final   Candida krusei NOT DETECTED NOT DETECTED Final   Candida parapsilosis NOT DETECTED NOT DETECTED Final   Candida tropicalis NOT DETECTED NOT DETECTED Final   Cryptococcus neoformans/gattii NOT DETECTED NOT DETECTED Final   Methicillin resistance mecA/C DETECTED (A) NOT DETECTED Final    Comment: CRITICAL RESULT CALLED TO, READ BACK BY AND VERIFIED WITH: C AMEND PHARMD 12/21/20 0228 JDW    Meth resistant mecA/C and MREJ DETECTED (A) NOT DETECTED Final    Comment: CRITICAL RESULT CALLED TO, READ BACK BY AND VERIFIED WITH: C AMEND PHARMD 12/21/20 0228 JDW Performed at [facility], [address], [place], [place] [address]   MRSA PCR Screening     Status: Abnormal   Collection Time: 12/20/20  6:15 PM   Specimen: Nasal Mucosa; Nasopharyngeal  Result Value Ref Range Status   MRSA by PCR POSITIVE (A) NEGATIVE Final    Comment:        The GeneXpert MRSA Assay (  FDA approved for NASAL specimens only), is one component of a comprehensive MRSA colonization surveillance program. It is not intended to diagnose MRSA infection nor to guide or monitor treatment for MRSA infections. RESULT CALLED TO, READ BACK BY AND VERIFIED WITH: RN T. TIE [id number] 2010 FCP Performed at [facility]  [address] [place], [place] [address]   Culture, blood (routine x 2)     Status: None   Collection Time: 12/22/20  6:46 AM   Specimen: BLOOD RIGHT WRIST  Result Value Ref Range Status   Specimen Description BLOOD RIGHT WRIST  Final   Special Requests   Final    BOTTLES DRAWN AEROBIC ONLY Blood Culture adequate volume   Culture   Final    NO GROWTH 5 DAYS Performed at [facility], [address], [place], [place] [address]    Report Status 12/27/2020 FINAL  Final  Culture, blood (Routine X 2) w Reflex to ID Panel     Status: None   Collection Time: 12/22/20  9:11 AM   Specimen: BLOOD  Result Value Ref Range Status   Specimen Description BLOOD BLOOD LEFT WRIST  Final   Special Requests   Final    BOTTLES DRAWN AEROBIC ONLY Blood Culture adequate volume   Culture   Final    NO GROWTH 5 DAYS Performed at [facility], [place] [address] [place], [place] [address]    Report Status 12/27/2020 FINAL  Final     Radiology Studies: [place] EKG SITE RITE  Result Date: 12/26/2020 If Site Rite image not attached, placement could not be confirmed due to current cardiac rhythm.   Scheduled Meds: . Chlorhexidine Gluconate Cloth  6 each Topical Daily  . enoxaparin (LOVENOX) injection  40 mg Subcutaneous Q24H  . nicotine  14 mg Transdermal Daily  . sodium chloride flush  10-40 mL Intracatheter Q12H  . sodium chloride flush  3 mL Intravenous Q12H   Continuous Infusions: . sodium chloride 10 mL/hr at 12/20/20 2115  . vancomycin 1,250 mg (12/28/20 0528)     LOS: 8 days   Marylu Lund, MD [facility] Pager On Amion  If 7PM-7AM, please contact night-coverage 12/28/2020, 2:43 PM

## 2020-12-29 DIAGNOSIS — F17210 Nicotine dependence, cigarettes, uncomplicated: Secondary | ICD-10-CM | POA: Diagnosis not present

## 2020-12-29 DIAGNOSIS — R7881 Bacteremia: Secondary | ICD-10-CM | POA: Diagnosis not present

## 2020-12-29 LAB — CBC WITH DIFFERENTIAL/PLATELET
Abs Immature Granulocytes: 0.04 10*3/uL (ref 0.00–0.07)
Basophils Absolute: 0 10*3/uL (ref 0.0–0.1)
Basophils Relative: 1 %
Eosinophils Absolute: 0.2 10*3/uL (ref 0.0–0.5)
Eosinophils Relative: 3 %
HCT: 34.3 % — ABNORMAL LOW (ref 36.0–46.0)
Hemoglobin: 11.1 g/dL — ABNORMAL LOW (ref 12.0–15.0)
Immature Granulocytes: 1 %
Lymphocytes Relative: 40 %
Lymphs Abs: 3 10*3/uL (ref 0.7–4.0)
MCH: 31 pg (ref 26.0–34.0)
MCHC: 32.4 g/dL (ref 30.0–36.0)
MCV: 95.8 fL (ref 80.0–100.0)
Monocytes Absolute: 0.8 10*3/uL (ref 0.1–1.0)
Monocytes Relative: 11 %
Neutro Abs: 3.4 10*3/uL (ref 1.7–7.7)
Neutrophils Relative %: 44 %
Platelets: 381 10*3/uL (ref 150–400)
RBC: 3.58 MIL/uL — ABNORMAL LOW (ref 3.87–5.11)
RDW: 12.5 % (ref 11.5–15.5)
WBC: 7.5 10*3/uL (ref 4.0–10.5)
nRBC: 0 % (ref 0.0–0.2)

## 2020-12-29 LAB — BASIC METABOLIC PANEL
Anion gap: 8 (ref 5–15)
BUN: 7 mg/dL (ref 6–20)
CO2: 26 mmol/L (ref 22–32)
Calcium: 8.9 mg/dL (ref 8.9–10.3)
Chloride: 101 mmol/L (ref 98–111)
Creatinine, Ser: 0.72 mg/dL (ref 0.44–1.00)
GFR, Estimated: >60 mL/min (ref >60)
Glucose, Bld: 89 mg/dL (ref 70–99)
Potassium: 4 mmol/L (ref 3.5–5.1)
Sodium: 135 mmol/L (ref 135–145)

## 2020-12-29 NOTE — Progress Notes (Signed)
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 She has had similar symptoms like this previously in the past. Current episode started about 4 days ago. Initially rash started out as little pimples that sometimes pop on their own or develop into abscesses. Denied any significant fever, chills, chest pain, cough, shortness of breath, leg swelling, abdominal pain, or blood in stool. Last admitted into the hospital in May of this year for septic shock with tricuspid valve endocarditis related to strep pyrogens bacteremia. she only stayed in hospital couple days prior to leaving [place] and never followed up with any provider. She claims to be clean from IV drugs for 120 days after getting out of jail on August 19.  Upon admission into the emergency department patient was hemodynamically stable other than mild tachycardia.  Urine drug screen was positive for amphetamines however she claims that she takes Adderall which is prescribed. Chest x-ray showed no acute abnormalities. ID consulted and recommended checking 3 sets of blood cultures starting cefazolin.  Blood culture growing MRSA and staph epidermidis. Repeat blood culture from 12/22/2020 - thus far.  TEE negative for any vegetations.  Assessment & Plan:   Principal Problem:   Sepsis ([place]) Active Problems:   Hepatitis C, chronic, maternal, antepartum ([place])   Cigarette smoker   Polysubstance dependence including opioid type drug without complication, episodic abuse ([place])   Endocarditis   Rash   Bacteremia   Sepsis secondary to MRSA bacteremia without endocarditis: - Patient  met sepsis criteria due to tachycardic with white blood cell count elevated up to 12.5 meeting SIRS criteria. Previously positive for group A strep (strep pyogenes) when admitted back in May with tricuspid valve endocarditis, but left AMA prior to receiving full recommended treatment. CRP was elevated at 6.3 and sedimentation rate was 40. ID is on board.   -Her repeat transthoracic echo does not show any endocarditis, Blood culture growing MRSA and staph epidermidis. Repeat blood culture from 12/22/2020 - thus far.   -Status post TEE which does not show any endocarditis.   -ID had been following. Recommendation for PICC placement with abx through 01/19/21. Remains stable  Unknown skin lesions, possibly carbuncles:.  -Patient presents with complaints of multiple skin ulcerations mostly concentrated on the face and neck.   -Worked up by ID, RPR and treponema neg  Asymptomatic bacteriuria:  -Cont to hold off on antibiotics specific for UTI at this time  Hyponatremia:  -Normalized  Elevated transaminases  history of chronic hepatitis C: Acute.   - LFTs overll remaining stable  Polysubstance abuse:  -Patient reportedly was clean from IV drug use for the last 120 days. Urine drug screen was positive for amphetamines however pt reports taking Adderall. Prior history of abuse also includes heroin, cocaine, and marijuana. -Continue to monitor for withdrawal symptoms.  Ativan IV if needed -Cessation done this visit  Mild left preseptal cellulitis: ID was concerned about possible osteomyelitis.  Patient has no visual symptoms or deep pain in the left eye.  Per ID recommendation, ophthalmology was consulted and pt is currently treated for preseptal cellulitis on current abx.  Patient continues to improve every day.  No problem with vision.  Mild AKI: Secondary to  sepsis.  Resolved. Cont to follow bmet trends  DVT prophylaxis: Lovenox subq Code Status: Full Family Communication: Pt in room,  family not at bedside  Status is: Inpatient  Remains inpatient appropriate because:IV treatments appropriate due to intensity of illness or inability to take PO   Dispo: The patient is from: Home              Anticipated d/c is to: Home              Anticipated d/c date is: > 3 days after completing abx, last dose 1/23              Patient currently is not medically stable to d/c.  Consultants:   ID  Cardiology  Procedures:   TEE, neg for vegetations  Antimicrobials: Anti-infectives (From admission, onward)   Start     Dose/Rate Route Frequency Ordered Stop   12/24/20 0600  vancomycin (VANCOREADY) IVPB 1250 mg/250 mL        1,250 mg 166.7 mL/hr over 90 Minutes Intravenous Every 12 hours 12/23/20 1723     12/23/20 1800  vancomycin (VANCOREADY) IVPB 500 mg/100 mL        500 mg 100 mL/hr over 60 Minutes Intravenous  Once 12/23/20 1724 12/23/20 1851   12/21/20 1600  vancomycin (VANCOREADY) IVPB 750 mg/150 mL  Status:  Discontinued        750 mg 150 mL/hr over 60 Minutes Intravenous Every 12 hours 12/21/20 0249 12/23/20 1723   12/21/20 0300  vancomycin (VANCOCIN) IVPB 1000 mg/200 mL premix        1,000 mg 200 mL/hr over 60 Minutes Intravenous STAT 12/21/20 0249 12/21/20 0711   12/20/20 0600  ceFAZolin (ANCEF) IVPB 1 g/50 mL premix  Status:  Discontinued        1 g 100 mL/hr over 30 Minutes Intravenous Every 8 hours 12/19/20 2254 12/21/20 0242      Subjective: Without issues  Objective: Vitals:   12/29/20 0022 12/29/20 0411 12/29/20 0853 12/29/20 1248  BP: (!) 92/56 (!) 100/56 (!) 93/56 101/60  Pulse: 83 89 84 89  Resp: _0 Temp: 98.3 F (36.8 C) 98.4 F (36.9 C) 97.8 F (36.6 C) 97.7 F (36.5 C)  TempSrc: Oral Oral Oral Oral  SpO2: 98% 94% 96% 98%  Weight:      Height:        Intake/Output Summary (Last 24 hours) at 12/29/2020 1558 Last data filed at 12/28/2020 2000 Gross per 24 hour  Intake 120 ml  Output -  Net 120 ml   Filed Weights   12/19/20  1634  Weight: 56.2 kg    Examination: General exam: lying in bed, in no acute distress Respiratory system: normal chest rise, clear, no audible wheezing  Data Reviewed: I have personally reviewed following labs and imaging studies  CBC: Recent Labs  Lab 12/25/20 0136 12/26/20 0530 12/27/20 0015 12/28/20 0538 12/29/20 0500  WBC 6.7 6.5 7.5 6.9 7.5  NEUTROABS 3.0 2.8 3.4 3.3 3.4  HGB 11.8* 11.8* 11.8* 10.9* 11.1*  HCT 36.8 35.1* 35.4* 33.9* 34.3*  MCV 95.8 94.1 94.7 95.5 95.8  PLT 357 360 292 376 025   Basic Metabolic Panel: Recent Labs  Lab 12/23/20 0349 12/25/20 0136 12/26/20 0530 12/29/20 0500  NA 134* 137 135 135  K 4.0 4.2 4.1 4.0  CL 100 104 101 101  CO2 _1 GLUCOSE 132* 98 97 89  BUN _2 7  CREATININE 0.96 0.79 0.82 0.72  CALCIUM 8.8* 8.9 9.4 8.9  MG 1.9  --   --   --    GFR: Estimated Creatinine Clearance: 80.8 mL/min (by C-G formula based on SCr of 0.72 mg/dL). Liver Function Tests: Recent Labs  Lab 12/23/20 0349 12/26/20 0530  AST 45* 70*  ALT 50* 90*  ALKPHOS 57 56  BILITOT 0.4 0.4  PROT 6.4* 6.4*  ALBUMIN 3.0* 3.0*   No results for input(s): LIPASE, AMYLASE in the last 168 hours. No results for input(s): AMMONIA in the last 168 hours. Coagulation Profile: No results for input(s): INR, PROTIME in the last 168 hours. Cardiac Enzymes: No results for input(s): CKTOTAL, CKMB, CKMBINDEX, TROPONINI in the last 168 hours. BNP (last 3 results) No results for input(s): PROBNP in the last 8760 hours. HbA1C: No results for input(s): HGBA1C in the last 72 hours. CBG: No results for input(s): GLUCAP in the last 168 hours. Lipid Profile: No results for input(s): CHOL, HDL, LDLCALC, TRIG, CHOLHDL, LDLDIRECT in the last 72 hours. Thyroid Function Tests: No results for input(s): TSH, T4TOTAL, FREET4, T3FREE, THYROIDAB in the last 72 hours. Anemia Panel: No results for input(s): VITAMINB12, FOLATE, FERRITIN, TIBC, IRON, RETICCTPCT in the last  72 hours. Sepsis Labs: No results for input(s): PROCALCITON, LATICACIDVEN in the last 168 hours.  Recent Results (from the past 240 hour(s))  Blood culture (routine x 2)     Status: None   Collection Time: 12/19/20 11:00 PM   Specimen: Left Antecubital; Blood  Result Value Ref Range Status   Specimen Description   Final    LEFT ANTECUBITAL Performed at [facility], [address] [place], [place] [address]    Special Requests   Final    BOTTLES DRAWN AEROBIC AND ANAEROBIC Blood Culture adequate volume Performed at [facility], [address] [place], [place] [address]    Culture   Final    NO GROWTH 5 DAYS Performed at [facility], [place] [address] [place], [place] [address]    Report Status 12/25/2020 FINAL  Final  Blood culture (routine x 2)     Status: Abnormal   Collection Time: 12/19/20 11:09 PM   Specimen: BLOOD RIGHT FOREARM  Result Value Ref Range Status   Specimen Description   Final    BLOOD RIGHT FOREARM Performed at [facility], [address] [place], [place] [address]    Special Requests   Final    BOTTLES DRAWN AEROBIC AND ANAEROBIC Blood Culture results may not be optimal due to an excessive volume of blood received in culture bottles Performed at [facility], [address] [place], [place] [address]    Culture  Setup Time   Final    GRAM POSITIVE COCCI AEROBIC BOTTLE ONLY CRITICAL VALUE NOTED.  VALUE IS CONSISTENT WITH PREVIOUSLY REPORTED AND CALLED VALUE.    Culture (A)  Final    STAPHYLOCOCCUS AUREUS SUSCEPTIBILITIES PERFORMED ON PREVIOUS CULTURE WITHIN THE LAST 5 DAYS. Performed at [facility], [place] [address], [place], [place] [address]    Report Status 12/25/2020 FINAL  Final  Resp Panel by RT-PCR (Flu A&B, Covid) Nasopharyngeal Swab     Status: None   Collection Time: 12/19/20 11:54 PM   Specimen: Nasopharyngeal Swab; Nasopharyngeal(NP) swabs in vial transport medium  Result Value  Ref Range Status   SARS Coronavirus 2 by RT PCR NEGATIVE NEGATIVE Final    Comment: (  NOTE) SARS-CoV-2 target nucleic acids are NOT DETECTED.  The SARS-CoV-2 RNA is generally detectable in upper respiratory specimens during the acute phase of infection. The lowest concentration of SARS-CoV-2 viral copies this assay can detect is 138 copies/mL. A negative result does not preclude SARS-Cov-2 infection and should not be used as the sole basis for treatment or other patient management decisions. A negative result may occur with  improper specimen collection/handling, submission of specimen other than nasopharyngeal swab, presence of viral mutation(s) within the areas targeted by this assay, and inadequate number of viral copies(<138 copies/mL). A negative result must be combined with clinical observations, patient history, and epidemiological information. The expected result is Negative.  Fact Sheet for Patients:  EntrepreneurPulse.com.au  Fact Sheet for Healthcare Providers:  IncredibleEmployment.be  This test is no t yet approved or cleared by the [place] FDA and  has been authorized for detection and/or diagnosis of SARS-CoV-2 by FDA under an Emergency Use Authorization (EUA). This EUA will remain  in effect (meaning this test can be used) for the duration of the COVID-19 declaration under Section 564(b)(1) of the Act, 21 U.S.C.section 360bbb-3(b)(1), unless the authorization is terminated  or revoked sooner.       Influenza A by PCR NEGATIVE NEGATIVE Final   Influenza B by PCR NEGATIVE NEGATIVE Final    Comment: (NOTE) The Xpert Xpress SARS-CoV-2/FLU/RSV plus assay is intended as an aid in the diagnosis of influenza from Nasopharyngeal swab specimens and should not be used as a sole basis for treatment. Nasal washings and aspirates are unacceptable for Xpert Xpress SARS-CoV-2/FLU/RSV testing.  Fact Sheet for  Patients: EntrepreneurPulse.com.au  Fact Sheet for Healthcare Providers: IncredibleEmployment.be  This test is not yet approved or cleared by the [place] FDA and has been authorized for detection and/or diagnosis of SARS-CoV-2 by FDA under an Emergency Use Authorization (EUA). This EUA will remain in effect (meaning this test can be used) for the duration of the COVID-19 declaration under Section 564(b)(1) of the Act, 21 U.S.C. section 360bbb-3(b)(1), unless the authorization is terminated or revoked.  Performed at [facility], [address] [place], [place] [address]   Culture, blood (single)     Status: Abnormal   Collection Time: 12/20/20  4:53 AM   Specimen: Left Antecubital; Blood  Result Value Ref Range Status   Specimen Description   Final    LEFT ANTECUBITAL Performed at [facility], [address] [place], [place] [address]    Special Requests   Final    BOTTLES DRAWN AEROBIC AND ANAEROBIC Blood Culture adequate volume Performed at [facility], [address] [place], [place] [address]    Culture  Setup Time   Final    IN BOTH AEROBIC AND ANAEROBIC BOTTLES GRAM POSITIVE COCCI Organism ID to follow CRITICAL RESULT CALLED TO, READ BACK BY AND VERIFIED WITH: C AMEND [facility] 12/21/20 0228 JDW Performed at [facility], [place] [address] [place], [place] [address]    Culture (A)  Final    METHICILLIN RESISTANT STAPHYLOCOCCUS AUREUS NONVIABLE  STAPHYLOCOCCUS EPIDERMIDIS    Report Status 12/24/2020 FINAL  Final   Organism ID, Bacteria METHICILLIN RESISTANT STAPHYLOCOCCUS AUREUS  Final      Susceptibility   Methicillin resistant staphylococcus aureus - MIC*    CIPROFLOXACIN >=8 RESISTANT Resistant     ERYTHROMYCIN >=8 RESISTANT Resistant     GENTAMICIN <=0.5 SENSITIVE Sensitive     OXACILLIN >=4 RESISTANT Resistant  TETRACYCLINE <=1 SENSITIVE Sensitive     VANCOMYCIN 1 SENSITIVE  Sensitive     TRIMETH/SULFA >=320 RESISTANT Resistant     CLINDAMYCIN >=8 RESISTANT Resistant     RIFAMPIN <=0.5 SENSITIVE Sensitive     Inducible Clindamycin NEGATIVE Sensitive     * METHICILLIN RESISTANT STAPHYLOCOCCUS AUREUS  Blood Culture ID Panel (Reflexed)     Status: Abnormal   Collection Time: 12/20/20  4:53 AM  Result Value Ref Range Status   Enterococcus faecalis NOT DETECTED NOT DETECTED Final   Enterococcus Faecium NOT DETECTED NOT DETECTED Final   Listeria monocytogenes NOT DETECTED NOT DETECTED Final   Staphylococcus species DETECTED (A) NOT DETECTED Final    Comment: CRITICAL RESULT CALLED TO, READ BACK BY AND VERIFIED WITH: C AMEND PHARMD 12/21/20 0228 JDW    Staphylococcus aureus (BCID) DETECTED (A) NOT DETECTED Final    Comment: Methicillin (oxacillin)-resistant Staphylococcus aureus (MRSA). MRSA is predictably resistant to beta-lactam antibiotics (except ceftaroline). Preferred therapy is vancomycin unless clinically contraindicated. Patient requires contact precautions if  hospitalized. CRITICAL RESULT CALLED TO, READ BACK BY AND VERIFIED WITH: C AMEND PHARMD 12/21/20 0228 JDW    Staphylococcus epidermidis DETECTED (A) NOT DETECTED Final    Comment: CRITICAL RESULT CALLED TO, READ BACK BY AND VERIFIED WITH: C AMEND PHARMD 12/21/20 0228 JDW    Staphylococcus lugdunensis NOT DETECTED NOT DETECTED Final   Streptococcus species NOT DETECTED NOT DETECTED Final   Streptococcus agalactiae NOT DETECTED NOT DETECTED Final   Streptococcus pneumoniae NOT DETECTED NOT DETECTED Final   Streptococcus pyogenes NOT DETECTED NOT DETECTED Final   A.calcoaceticus-baumannii NOT DETECTED NOT DETECTED Final   Bacteroides fragilis NOT DETECTED NOT DETECTED Final   Enterobacterales NOT DETECTED NOT DETECTED Final   Enterobacter cloacae complex NOT DETECTED NOT DETECTED Final   Escherichia coli NOT DETECTED NOT DETECTED Final   Klebsiella aerogenes NOT DETECTED NOT DETECTED Final    Klebsiella oxytoca NOT DETECTED NOT DETECTED Final   Klebsiella pneumoniae NOT DETECTED NOT DETECTED Final   Proteus species NOT DETECTED NOT DETECTED Final   Salmonella species NOT DETECTED NOT DETECTED Final   Serratia marcescens NOT DETECTED NOT DETECTED Final   Haemophilus influenzae NOT DETECTED NOT DETECTED Final   Neisseria meningitidis NOT DETECTED NOT DETECTED Final   Pseudomonas aeruginosa NOT DETECTED NOT DETECTED Final   Stenotrophomonas maltophilia NOT DETECTED NOT DETECTED Final   Candida albicans NOT DETECTED NOT DETECTED Final   Candida auris NOT DETECTED NOT DETECTED Final   Candida glabrata NOT DETECTED NOT DETECTED Final   Candida krusei NOT DETECTED NOT DETECTED Final   Candida parapsilosis NOT DETECTED NOT DETECTED Final   Candida tropicalis NOT DETECTED NOT DETECTED Final   Cryptococcus neoformans/gattii NOT DETECTED NOT DETECTED Final   Methicillin resistance mecA/C DETECTED (A) NOT DETECTED Final    Comment: CRITICAL RESULT CALLED TO, READ BACK BY AND VERIFIED WITH: C AMEND PHARMD 12/21/20 0228 JDW    Meth resistant mecA/C and MREJ DETECTED (A) NOT DETECTED Final    Comment: CRITICAL RESULT CALLED TO, READ BACK BY AND VERIFIED WITH: C AMEND PHARMD 12/21/20 0228 JDW Performed at [facility], [address], [place], [place] [address]   MRSA PCR Screening     Status: Abnormal   Collection Time: 12/20/20  6:15 PM   Specimen: Nasal Mucosa; Nasopharyngeal  Result Value Ref Range Status   MRSA by PCR POSITIVE (A) NEGATIVE Final    Comment:        The GeneXpert MRSA Assay (  FDA approved for NASAL specimens only), is one component of a comprehensive MRSA colonization surveillance program. It is not intended to diagnose MRSA infection nor to guide or monitor treatment for MRSA infections. RESULT CALLED TO, READ BACK BY AND VERIFIED WITH: RN T. TIE [id number] 2010 FCP Performed at [facility] [address], [place], [place] [address]   Culture,  blood (routine x 2)     Status: None   Collection Time: 12/22/20  6:46 AM   Specimen: BLOOD RIGHT WRIST  Result Value Ref Range Status   Specimen Description BLOOD RIGHT WRIST  Final   Special Requests   Final    BOTTLES DRAWN AEROBIC ONLY Blood Culture adequate volume   Culture   Final    NO GROWTH 5 DAYS Performed at [facility], [address] [place], [place] [address]    Report Status 12/27/2020 FINAL  Final  Culture, blood (Routine X 2) w Reflex to ID Panel     Status: None   Collection Time: 12/22/20  9:11 AM   Specimen: BLOOD  Result Value Ref Range Status   Specimen Description BLOOD BLOOD LEFT WRIST  Final   Special Requests   Final    BOTTLES DRAWN AEROBIC ONLY Blood Culture adequate volume   Culture   Final    NO GROWTH 5 DAYS Performed at [facility], [place] [address] [place], [place] [address]    Report Status 12/27/2020 FINAL  Final     Radiology Studies: No results found.  Scheduled Meds: . Chlorhexidine Gluconate Cloth  6 each Topical Daily  . enoxaparin (LOVENOX) injection  40 mg Subcutaneous Q24H  . nicotine  14 mg Transdermal Daily  . sodium chloride flush  10-40 mL Intracatheter Q12H  . sodium chloride flush  3 mL Intravenous Q12H   Continuous Infusions: . sodium chloride 10 mL/hr at 12/20/20 2115  . vancomycin 1,250 mg (12/29/20 0508)     LOS: 9 days   Marylu Lund, MD [facility] Pager On Amion  If 7PM-7AM, please contact night-coverage 12/29/2020, 3:58 PM

## 2020-12-30 DIAGNOSIS — R7881 Bacteremia: Secondary | ICD-10-CM | POA: Diagnosis not present

## 2020-12-30 DIAGNOSIS — O98419 Viral hepatitis complicating pregnancy, unspecified trimester: Secondary | ICD-10-CM | POA: Diagnosis not present

## 2020-12-30 DIAGNOSIS — B182 Chronic viral hepatitis C: Secondary | ICD-10-CM | POA: Diagnosis not present

## 2020-12-30 DIAGNOSIS — I330 Acute and subacute infective endocarditis: Secondary | ICD-10-CM | POA: Diagnosis not present

## 2020-12-30 LAB — CBC WITH DIFFERENTIAL/PLATELET
Abs Immature Granulocytes: 0.03 10*3/uL (ref 0.00–0.07)
Basophils Absolute: 0 10*3/uL (ref 0.0–0.1)
Basophils Relative: 0 %
Eosinophils Absolute: 0.2 10*3/uL (ref 0.0–0.5)
Eosinophils Relative: 3 %
HCT: 34.4 % — ABNORMAL LOW (ref 36.0–46.0)
Hemoglobin: 11 g/dL — ABNORMAL LOW (ref 12.0–15.0)
Immature Granulocytes: 0 %
Lymphocytes Relative: 37 %
Lymphs Abs: 2.7 10*3/uL (ref 0.7–4.0)
MCH: 30.6 pg (ref 26.0–34.0)
MCHC: 32 g/dL (ref 30.0–36.0)
MCV: 95.6 fL (ref 80.0–100.0)
Monocytes Absolute: 0.7 10*3/uL (ref 0.1–1.0)
Monocytes Relative: 10 %
Neutro Abs: 3.6 10*3/uL (ref 1.7–7.7)
Neutrophils Relative %: 50 %
Platelets: 342 10*3/uL (ref 150–400)
RBC: 3.6 MIL/uL — ABNORMAL LOW (ref 3.87–5.11)
RDW: 12.7 % (ref 11.5–15.5)
WBC: 7.2 10*3/uL (ref 4.0–10.5)
nRBC: 0 % (ref 0.0–0.2)

## 2020-12-30 LAB — HEPATITIS C VRS RNA DETECT BY PCR-QUAL: Hepatitis C Vrs RNA by PCR-Qual: POSITIVE — AB

## 2020-12-30 NOTE — Progress Notes (Signed)
Pharmacy Antibiotic Note  [name] is a 30 y.o. female admitted on 12/19/2020 with recurrent skin infections found to have MRSA bacteremia. 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  Pharmacy has been consulted for vancomycin dosing. Pharmacy has been consulted for vancomycin dosing. Patients remains afebrile, WBC wnl, and stable. TTE and TEE showed no vegetation.  Per ID, duration of therapy will be 4 weeks from negative Bcx on 12/22/20 which will be 01/19/21 -WBC= 7.3, SCr= 0.72  Plan: Continue vancomycin 1250mg  q12h  Check a vancomycin trough later this week  Height: 5' (152.4 cm) Weight: 56.2 kg (124 lb) IBW/kg (Calculated) : 45.5  Temp (24hrs), Avg:98 F (36.7 C), Min:97.7 F (36.5 C), Max:98.2 F (36.8 C)  Recent Labs  Lab 12/23/20 1535 12/24/20 0251 12/25/20 0136 12/26/20 0530 12/27/20 0015 12/28/20 0538 12/29/20 0500 12/30/20 0500  WBC  --    < > 6.7 6.5 7.5 6.9 7.5 7.2  CREATININE  --   --  0.79 0.82  --   --  0.72  --   VANCOTROUGH 9*  --   --  16  --   --   --   --    < > = values in this interval not displayed.    Estimated Creatinine Clearance: 80.8 mL/min (by C-G formula based on SCr of 0.72 mg/dL).    Allergies  Allergen Reactions  . Dextromethorphan Swelling    Facial swelling    Antimicrobials this admission: Vancomycin 12/25 >>   Dose adjustments this admission: 12/27: vancomycin 750mg  q12h changed to 1250mg  q12h  Microbiology results: 12/23 bcx: 1/4 MRSA 12/24 bcx:  2/2 MRSA 12/26 bcx: neg   1/24, PharmD Clinical Pharmacist **Pharmacist phone directory can now be found on [email or web address] ([email or web address]).  Listed under [facility].

## 2020-12-30 NOTE — Progress Notes (Addendum)
[facility] PROGRESS NOTE  [name] ZWC:[id number] DOB: [birth year] DOA: 12/19/2020 PCP: Pcp, No  Status: Remains inpatient appropriate because:Unsafe d/c plan and IV treatments appropriate due to intensity of illness or inability to take PO   Dispo: The patient is from: Home              Anticipated d/c is to: Home              Anticipated d/c date is: > 3 days              Patient currently is not medically stable to d/c.  Barriers to discharge: Patient needs at least 5 more days of IV antibiotics to treat endocarditis (ID recommended a total of 14 days).  Also for history of IV drug abuse she is not an appropriate candidate for home IV antibiotics. 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 She has had similar symptoms like this previously in the past. Current episode started about 4 days ago. Initially rash started out as little pimples that sometimes pop on their own or develop into abscesses. Denied any significant fever, chills, chest pain, cough, shortness of breath, leg swelling, abdominal pain, or blood in stool. Last admitted into the hospital in May of this year for septic shock with tricuspid valve endocarditis related to strep pyrogens bacteremia. she only stayed in hospital couple days prior to leaving AGAINST MEDICAL ADVICE and never followed up with any provider. She claims to be clean from IV drugs for 120 days after getting out of jail on August 19.  Upon admission into the  emergency department patient was hemodynamically stable other than mild tachycardia. Urine drug screen was positive for amphetamineshowever she claims that she takes Adderall which is prescribed. Chest x-ray showed no acute abnormalities. ID consulted and recommended checking 3 sets of blood cultures starting cefazolin. Blood culture growing MRSA and staph epidermidis. Repeat blood culture from 12/22/2020 - thus far.TEE negative for any vegetations.  Subjective: Patient awakened from sleep.  Does respond to questions asked but prefers to go back to sleep.  Objective: Vitals:   12/30/20 0358 12/30/20 0754  BP: 95/60 95/60  Pulse: 92 88  Resp: 16 18  Temp: 98.1 F (36.7 C) 97.7 F (36.5 C)  SpO2: 93% 96%    Intake/Output Summary (Last 24 hours) at 12/30/2020 1215 Last data filed at 12/29/2020 2000 Gross per 24 hour  Intake 120 ml  Output --  Net 120 ml   Filed Weights   12/19/20 1634  Weight: 56.2 kg    Exam:  Constitutional: NAD, calm, comfortable Respiratory: clear to auscultation bilaterally, Normal respiratory effort. No accessory muscle use.  Cardiovascular: Regular rate and rhythm, no murmurs / rubs / gallops. No extremity edema. Abdomen: no tenderness, no masses palpated. Bowel sounds positive.  Musculoskeletal: No joint deformity upper and lower extremities. Normal muscle tone.  Skin: Multiple healing skin ulcerations about the face and neck out any perilesion erythema Neurologic: CN 2-12 grossly intact. Sensation intact, DTR normal. Strength 5/5 x all 4 extremities.  Psychiatric: Normal judgment and insight. Alert and  oriented x 3. Normal mood.    Assessment/Plan: Acute problems: Sepsis secondary to MRSA bacteremia without endocarditis Previously positive for group A strep (strep pyogenes) when admitted back in May with tricuspid valve endocarditis, but left AMA prior to receiving full recommended treatment.  This admission CRP was elevated at 6.3 and  sedimentation rate was 40. ID recommended a total of 14 days of IV antibiotics (today is day 9) Her repeat transthoracic echo does not show any endocarditis, Blood culture growing MRSA and staph epidermidis. Repeat blood culture from 12/22/2020  thus far.  Status post TEE which does not show any endocarditis.  PICC placed this admission and recommended to be discharged  Polysubstance abuse  Patient reportedly was clean from IV drug use for the last 120 days.  Urine drug screen was positive for amphetamines however pt reports taking Adderall. Review of preadmission medicines does not have Adderall or other ADHD medications listed.  Attempted to contact OP pharmacy listed in epic for patient but they do not have any records that the patient utilizes that pharmacy. Prior history of abuse also includes heroin, cocaine, and marijuana. No signs of withdrawal and patient currently not on methadone or Suboxone for maintenance-can make recommendations prior to discharge regarding initiating methadone or if patient interested can start Suboxone if can obtain follow-up appointment through the internal medicine clinic.  Unknown skin lesions, possibly carbuncles Patient presents with complaints of multiple skin ulcerations mostly concentrated on the face and neck. Likely secondary to Staphylococcus.Worked up by ID, RPR and treponema neg  Mild left preseptal cellulitis ID was concerned about possible osteomyelitis Patient has no visual symptoms or deep pain in the left eye. Per ID recommendation, ophthalmology was consulted and pt is currently treated for preseptal cellulitis on current abx. Patient continues to improve every day. No problem with vision.  Asymptomatic bacteriuria Cont to hold off on antibiotics specific for UTI at this time  ? Hepatitis C Per ID: "Her Hep C RNA was (-) on 12-26. I am doubtful that this is accurate as she prev had VL in the 100s of thousands. She denies treatment.  I  have re-ordered this.  She can f/u in ID clinic for her bacteremia and potential Hep C treatment.  Will order Hep A and B vaccines.  elastogram can not be done inpt. Her prev u/s from 04-2020 was (-) for strutural abn" LFTs stable   Other problems: Hyponatremia -Normalized  Mild AKI Secondary to sepsis.  Resolved.  Cont to follow bmet trends   Data Reviewed: Basic Metabolic Panel: Recent Labs  Lab 12/25/20 0136 12/26/20 0530 12/29/20 0500  NA 137 135 135  K 4.2 4.1 4.0  CL 104 101 101  CO2 25 26 26   GLUCOSE 98 97 89  BUN 9 8 7   CREATININE 0.79 0.82 0.72  CALCIUM 8.9 9.4 8.9   Liver Function Tests: Recent Labs  Lab 12/26/20 0530  AST 70*  ALT 90*  ALKPHOS 56  BILITOT 0.4  PROT 6.4*  ALBUMIN 3.0*   No results for input(s): LIPASE, AMYLASE in the last 168 hours. No results for input(s): AMMONIA in the last 168 hours. CBC: Recent Labs  Lab 12/26/20 0530 12/27/20 0015 12/28/20 0538 12/29/20 0500 12/30/20 0500  WBC 6.5 7.5 6.9 7.5 7.2  NEUTROABS 2.8 3.4 3.3 3.4 3.6  HGB 11.8* 11.8* 10.9* 11.1* 11.0*  HCT 35.1* 35.4* 33.9* 34.3* 34.4*  MCV 94.1 94.7 95.5 95.8 95.6  PLT 360 292 376 381 342   Cardiac  Enzymes: No results for input(s): CKTOTAL, CKMB, CKMBINDEX, TROPONINI in the last 168 hours. BNP (last 3 results) No results for input(s): BNP in the last 8760 hours.  ProBNP (last 3 results) No results for input(s): PROBNP in the last 8760 hours.  CBG: No results for input(s): GLUCAP in the last 168 hours.  Recent Results (from the past 240 hour(s))  MRSA PCR Screening     Status: Abnormal   Collection Time: 12/20/20  6:15 PM   Specimen: Nasal Mucosa; Nasopharyngeal  Result Value Ref Range Status   MRSA by PCR POSITIVE (A) NEGATIVE Final    Comment:        The GeneXpert MRSA Assay (FDA approved for NASAL specimens only), is one component of a comprehensive MRSA colonization surveillance program. It is not intended to diagnose MRSA infection nor  to guide or monitor treatment for MRSA infections. RESULT CALLED TO, READ BACK BY AND VERIFIED WITH: RN T. TIE [id number] 2010 FCP Performed at [facility] [address] [place], [place] [address]   Culture, blood (routine x 2)     Status: None   Collection Time: 12/22/20  6:46 AM   Specimen: BLOOD RIGHT WRIST  Result Value Ref Range Status   Specimen Description BLOOD RIGHT WRIST  Final   Special Requests   Final    BOTTLES DRAWN AEROBIC ONLY Blood Culture adequate volume   Culture   Final    NO GROWTH 5 DAYS Performed at [facility], [address] [place], [place] [address]    Report Status 12/27/2020 FINAL  Final  Culture, blood (Routine X 2) w Reflex to ID Panel     Status: None   Collection Time: 12/22/20  9:11 AM   Specimen: BLOOD  Result Value Ref Range Status   Specimen Description BLOOD BLOOD LEFT WRIST  Final   Special Requests   Final    BOTTLES DRAWN AEROBIC ONLY Blood Culture adequate volume   Culture   Final    NO GROWTH 5 DAYS Performed at [facility], [place] [address] [place], [place] [address]    Report Status 12/27/2020 FINAL  Final     Studies: No results found.  Scheduled Meds: . Chlorhexidine Gluconate Cloth  6 each Topical Daily  . enoxaparin (LOVENOX) injection  40 mg Subcutaneous Q24H  . nicotine  14 mg Transdermal Daily  . sodium chloride flush  10-40 mL Intracatheter Q12H  . sodium chloride flush  3 mL Intravenous Q12H   Continuous Infusions: . sodium chloride 10 mL/hr at 12/20/20 2115  . vancomycin 1,250 mg (12/30/20 0504)    Principal Problem:   Sepsis ([place]) Active Problems:   Hepatitis C, chronic, maternal, antepartum (H[facility])   Cigarette smoker   Polysubstance dependence including opioid type drug without complication, episodic abuse ([place])   Endocarditis   Rash   Bacteremia   Consultants:  ID  Allergy  Procedures:  TEE  Antibiotics: Anti-infectives (From admission, onward)   Start     Dose/Rate  Route Frequency Ordered Stop   12/24/20 0600  vancomycin (VANCOREADY) IVPB 1250 mg/250 mL        1,250 mg 166.7 mL/hr over 90 Minutes Intravenous Every 12 hours 12/23/20 1723     12/23/20 1800  vancomycin (VANCOREADY) IVPB 500 mg/100 mL        500 mg 100 mL/hr over 60 Minutes Intravenous  Once 12/23/20 1724 12/23/20 1851   12/21/20 1600  vancomycin (VANCOREADY) IVPB 750 mg/150 mL  Status:  Discontinued        750 mg 150 mL/hr over 60 Minutes Intravenous Every 12 hours 12/21/20 0249 12/23/20 1723   12/21/20 0300  vancomycin (VANCOCIN) IVPB 1000 mg/200 mL premix        1,000 mg 200 mL/hr over 60 Minutes Intravenous STAT 12/21/20 0249 12/21/20 0711   12/20/20 0600  ceFAZolin (ANCEF) IVPB 1 g/50 mL premix  Status:  Discontinued        1 g 100 mL/hr over 30 Minutes Intravenous Every 8 hours 12/19/20 2254 12/21/20 0242        Time spent: 30 minutes    Junious Silk ANP  [facility] 7 am - 330 pm/M-F 10 days

## 2020-12-31 DIAGNOSIS — I38 Endocarditis, valve unspecified: Secondary | ICD-10-CM | POA: Diagnosis not present

## 2020-12-31 DIAGNOSIS — R7881 Bacteremia: Secondary | ICD-10-CM | POA: Diagnosis not present

## 2020-12-31 LAB — CBC WITH DIFFERENTIAL/PLATELET
Abs Immature Granulocytes: 0.02 10*3/uL (ref 0.00–0.07)
Basophils Absolute: 0 10*3/uL (ref 0.0–0.1)
Basophils Relative: 1 %
Eosinophils Absolute: 0.2 10*3/uL (ref 0.0–0.5)
Eosinophils Relative: 3 %
HCT: 34.7 % — ABNORMAL LOW (ref 36.0–46.0)
Hemoglobin: 11.2 g/dL — ABNORMAL LOW (ref 12.0–15.0)
Immature Granulocytes: 0 %
Lymphocytes Relative: 46 %
Lymphs Abs: 3 10*3/uL (ref 0.7–4.0)
MCH: 31.1 pg (ref 26.0–34.0)
MCHC: 32.3 g/dL (ref 30.0–36.0)
MCV: 96.4 fL (ref 80.0–100.0)
Monocytes Absolute: 0.6 10*3/uL (ref 0.1–1.0)
Monocytes Relative: 9 %
Neutro Abs: 2.6 10*3/uL (ref 1.7–7.7)
Neutrophils Relative %: 41 %
Platelets: 320 10*3/uL (ref 150–400)
RBC: 3.6 MIL/uL — ABNORMAL LOW (ref 3.87–5.11)
RDW: 12.8 % (ref 11.5–15.5)
WBC: 6.4 10*3/uL (ref 4.0–10.5)
nRBC: 0 % (ref 0.0–0.2)

## 2020-12-31 NOTE — Progress Notes (Signed)
Patient heart rate elevated to 150's. Pt without SOB, dizziness or lightheadedness. Patient in room after bathing, making up bed and arranging items in closet. Asked pt to return to bed and let heart rate decrease before continuing with activity. Patient agreeable but states "this is what happens". Pt resting with call bell within reach.  Will continue to monitor.

## 2020-12-31 NOTE — Progress Notes (Signed)
Escorted patient and mother to main entrance to use ATM. Pt escorted back to room after mom left through employee entrance. Pt resting with call bell within reach.  Will continue to monitor.

## 2020-12-31 NOTE — Progress Notes (Signed)
[facility] PROGRESS NOTE  [name] QQI:[id number] DOB: 02/09/1991 DOA: 12/19/2020 PCP: Pcp, No  Status: Remains inpatient appropriate because:Unsafe d/c plan and IV treatments appropriate due to intensity of illness or inability to take PO   Dispo: The patient is from: Home              Anticipated d/c is to: Home              Anticipated d/c date is: > 3 days anticipated discharge date 01/04/2021              Patient currently is not medically stable to d/c.  Barriers to discharge: Patient needs at least 5 more days of IV antibiotics to treat endocarditis (ID recommended a total of 14 days).  Also for history of IV drug abuse she is not an appropriate candidate for home IV antibiotics. 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 She has had similar symptoms like this previously in the past. Current episode started about 4 days ago. Initially rash started out as little pimples that sometimes pop on their own or develop into abscesses. Denied any significant fever, chills, chest pain, cough, shortness of breath, leg swelling, abdominal pain, or blood in stool. Last admitted into the hospital in May of this year for septic shock with tricuspid valve endocarditis related to strep pyrogens bacteremia. she only stayed in hospital couple days prior to leaving AGAINST MEDICAL ADVICE and never followed up with any provider. She claims to be clean from IV drugs for 120 days after getting out of jail on  August 19.  Upon admission into the emergency department patient was hemodynamically stable other than mild tachycardia. Urine drug screen was positive for amphetamineshowever she claims that she takes Adderall which is prescribed. Chest x-ray showed no acute abnormalities. ID consulted and recommended checking 3 sets of blood cultures starting cefazolin. Blood culture growing MRSA and staph epidermidis. Repeat blood culture from 12/22/2020 - thus far.TEE negative for any vegetations.  Subjective: Awakened.  No complaints.  Updated her on new anticipated discharge based on previous documentation on 12/31 from ID physician.  Patient reports that several months ago she had been prescribed Adderall by [facility] but this prescription had run out and she has not taken for at least 30 days.  She is interested in resuming.  Instructed patient that she likely needs to follow-up with [facility] after discharge.  I agreed that utilization of this medicine to treat ADD would be helpful in prevention of relapse for her substance abuse.  Objective: Vitals:   12/31/20 0512 12/31/20 0916  BP: 90/63 (!) 82/42  Pulse:  82  Resp: 18 14  Temp: 98 F (36.7 C) 98 F (36.7 C)  SpO2: 98% 98%    Intake/Output Summary (Last 24 hours) at 12/31/2020 1035 Last data filed at 12/31/2020 0900 Gross per 24 hour  Intake 380 ml  Output -  Net 380 ml   Filed Weights   12/19/20 1634  Weight: 56.2 kg    Exam:  Constitutional: Awakens, calm, pleasant affect Respiratory: Room air sat stable, bilateral  lung sounds clear Cardiovascular: Pulse regular, heart sounds are normal S1-S2 without rubs or murmurs, no peripheral edema Abdomen: Soft with normoactive bowel sounds.  Tolerating solid diet. LBM 12/30  Skin: Multiple healed skin ulcerations about the face and neck out any perilesion erythema Neurologic: CN 2-12 grossly intact. Sensation intact, Strength 5/5 x all 4 extremities.  Psychiatric: Normal judgment and  insight. Alert and oriented x 3. Normal mood.    Assessment/Plan: Acute problems: Sepsis secondary to MRSA bacteremia without endocarditis Previously positive for group A strep (strep pyogenes) when admitted back in May with tricuspid valve endocarditis, but left AMA prior to receiving full recommended treatment.  This admission CRP was elevated at 6.3 and sedimentation rate was 40. ID recommended a total of 14 days of IV antibiotics today is day 10 with last dose due 01/04/2021 Her repeat transthoracic echo does not show any endocarditis, Blood culture growing MRSA and staph epidermidis. Repeat blood culture from 12/22/2020  thus far.  Status post TEE which does not show any endocarditis.  PICC placed this admission and recommended to be discharged  Polysubstance abuse  Patient reportedly was clean from IV drug use for the last 120 days.  Urine drug screen was positive for amphetamines however pt reports taking Adderall. Review of preadmission medicines does not have Adderall or other ADHD medications listed.  Attempted to contact OP pharmacy listed in epic for patient but they do not have any records that the patient utilizes that pharmacy. Prior history of abuse also includes heroin, cocaine, and marijuana. No signs of withdrawal and patient currently not on methadone or Suboxone for maintenance-can make recommendations prior to discharge regarding initiating methadone or if patient interested can start Suboxone if can obtain follow-up appointment through the internal medicine clinic.  Unknown skin lesions, possibly carbuncles Patient presents with complaints of multiple skin ulcerations mostly concentrated on the face and neck. Likely secondary to Staphylococcus.Worked up by ID, RPR and treponema neg  Mild left preseptal cellulitis ID was concerned about possible osteomyelitis Patient has no visual symptoms or deep pain in the left eye. Per ID recommendation, ophthalmology was consulted  and pt is currently treated for preseptal cellulitis on current abx. Patient continues to improve every day. No problem with vision.  Asymptomatic bacteriuria Cont to hold off on antibiotics specific for UTI at this time  ? Hepatitis C Per ID: "Her Hep C RNA was (-) on 12-26. I am doubtful that this is accurate as she prev had VL in the 100s of thousands. She denies treatment.  I have re-ordered this.  She can f/u in ID clinic for her bacteremia and potential Hep C treatment.  Will order Hep A and B vaccines.  elastogram can not be done inpt. Her prev u/s from 04-2020 was (-) for strutural abn" LFTs stable   Other problems: Hyponatremia -Normalized  Mild AKI Secondary to sepsis.  Resolved.  Cont to follow bmet trends   Data Reviewed: Basic Metabolic Panel: Recent Labs  Lab 12/25/20 0136 12/26/20 0530 12/29/20 0500  NA 137 135 135  K 4.2 4.1 4.0  CL 104 101 101  CO2 25 26 26   GLUCOSE 98 97 89  BUN 9 8 7   CREATININE 0.79 0.82 0.72  CALCIUM 8.9 9.4 8.9   Liver Function Tests: Recent Labs  Lab 12/26/20 0530  AST 70*  ALT 90*  ALKPHOS 56  BILITOT 0.4  PROT 6.4*  ALBUMIN 3.0*   No results for input(s): LIPASE, AMYLASE in the last 168  hours. No results for input(s): AMMONIA in the last 168 hours. CBC: Recent Labs  Lab 12/27/20 0015 12/28/20 0538 12/29/20 0500 12/30/20 0500 12/31/20 0534  WBC 7.5 6.9 7.5 7.2 6.4  NEUTROABS 3.4 3.3 3.4 3.6 2.6  HGB 11.8* 10.9* 11.1* 11.0* 11.2*  HCT 35.4* 33.9* 34.3* 34.4* 34.7*  MCV 94.7 95.5 95.8 95.6 96.4  PLT 292 376 381 342 320   Cardiac Enzymes: No results for input(s): CKTOTAL, CKMB, CKMBINDEX, TROPONINI in the last 168 hours. BNP (last 3 results) No results for input(s): BNP in the last 8760 hours.  ProBNP (last 3 results) No results for input(s): PROBNP in the last 8760 hours.  CBG: No results for input(s): GLUCAP in the last 168 hours.  Recent Results (from the past 240 hour(s))  Culture, blood  (routine x 2)     Status: None   Collection Time: 12/22/20  6:46 AM   Specimen: BLOOD RIGHT WRIST  Result Value Ref Range Status   Specimen Description BLOOD RIGHT WRIST  Final   Special Requests   Final    BOTTLES DRAWN AEROBIC ONLY Blood Culture adequate volume   Culture   Final    NO GROWTH 5 DAYS Performed at [facility], [address], [place], [place] [address]    Report Status 12/27/2020 FINAL  Final  Culture, blood (Routine X 2) w Reflex to ID Panel     Status: None   Collection Time: 12/22/20  9:11 AM   Specimen: BLOOD  Result Value Ref Range Status   Specimen Description BLOOD BLOOD LEFT WRIST  Final   Special Requests   Final    BOTTLES DRAWN AEROBIC ONLY Blood Culture adequate volume   Culture   Final    NO GROWTH 5 DAYS Performed at [facility], [address] [place], [place] [address]    Report Status 12/27/2020 FINAL  Final     Studies: No results found.  Scheduled Meds: . Chlorhexidine Gluconate Cloth  6 each Topical Daily  . enoxaparin (LOVENOX) injection  40 mg Subcutaneous Q24H  . nicotine  14 mg Transdermal Daily  . sodium chloride flush  10-40 mL Intracatheter Q12H  . sodium chloride flush  3 mL Intravenous Q12H   Continuous Infusions: . sodium chloride 10 mL/hr at 12/20/20 2115  . vancomycin 1,250 mg (12/31/20 0519)    Principal Problem:   Sepsis ([facility]) Active Problems:   Hepatitis C, chronic, maternal, antepartum (HCC)   Cigarette smoker   Polysubstance dependence including opioid type drug without complication, episodic abuse ([facility])   Endocarditis   Rash   Bacteremia   Consultants:  ID  Allergy  Procedures:  TEE  Antibiotics: Anti-infectives (From admission, onward)   Start     Dose/Rate Route Frequency Ordered Stop   12/24/20 0600  vancomycin (VANCOREADY) IVPB 1250 mg/250 mL        1,250 mg 166.7 mL/hr over 90 Minutes Intravenous Every 12 hours 12/23/20 1723     12/23/20 1800  vancomycin (VANCOREADY) IVPB 500  mg/100 mL        500 mg 100 mL/hr over 60 Minutes Intravenous  Once 12/23/20 1724 12/23/20 1851   12/21/20 1600  vancomycin (VANCOREADY) IVPB 750 mg/150 mL  Status:  Discontinued        750 mg 150 mL/hr over 60 Minutes Intravenous Every 12 hours 12/21/20 0249 12/23/20 1723   12/21/20 0300  vancomycin (VANCOCIN) IVPB 1000 mg/200 mL premix  1,000 mg 200 mL/hr over 60 Minutes Intravenous STAT 12/21/20 0249 12/21/20 0711   12/20/20 0600  ceFAZolin (ANCEF) IVPB 1 g/50 mL premix  Status:  Discontinued        1 g 100 mL/hr over 30 Minutes Intravenous Every 8 hours 12/19/20 2254 12/21/20 0242       Time spent: 20 minutes    Junious Silk ANP  [facility] 7 am - 330 pm/M-F 11 days

## 2021-01-01 DIAGNOSIS — R000 Tachycardia, unspecified: Secondary | ICD-10-CM

## 2021-01-01 DIAGNOSIS — R7881 Bacteremia: Secondary | ICD-10-CM | POA: Diagnosis not present

## 2021-01-01 DIAGNOSIS — I38 Endocarditis, valve unspecified: Secondary | ICD-10-CM | POA: Diagnosis not present

## 2021-01-01 DIAGNOSIS — F1920 Other psychoactive substance dependence, uncomplicated: Secondary | ICD-10-CM | POA: Diagnosis not present

## 2021-01-01 LAB — BASIC METABOLIC PANEL
Anion gap: 8 (ref 5–15)
BUN: 11 mg/dL (ref 6–20)
CO2: 25 mmol/L (ref 22–32)
Calcium: 8.9 mg/dL (ref 8.9–10.3)
Chloride: 103 mmol/L (ref 98–111)
Creatinine, Ser: 0.9 mg/dL (ref 0.44–1.00)
GFR, Estimated: >60 mL/min (ref >60)
Glucose, Bld: 103 mg/dL — ABNORMAL HIGH (ref 70–99)
Potassium: 3.9 mmol/L (ref 3.5–5.1)
Sodium: 136 mmol/L (ref 135–145)

## 2021-01-01 LAB — CBC WITH DIFFERENTIAL/PLATELET
Abs Immature Granulocytes: 0.02 10*3/uL (ref 0.00–0.07)
Basophils Absolute: 0 10*3/uL (ref 0.0–0.1)
Basophils Relative: 1 %
Eosinophils Absolute: 0.2 10*3/uL (ref 0.0–0.5)
Eosinophils Relative: 3 %
HCT: 32.8 % — ABNORMAL LOW (ref 36.0–46.0)
Hemoglobin: 11.1 g/dL — ABNORMAL LOW (ref 12.0–15.0)
Immature Granulocytes: 0 %
Lymphocytes Relative: 43 %
Lymphs Abs: 2.8 10*3/uL (ref 0.7–4.0)
MCH: 31.8 pg (ref 26.0–34.0)
MCHC: 33.8 g/dL (ref 30.0–36.0)
MCV: 94 fL (ref 80.0–100.0)
Monocytes Absolute: 0.6 10*3/uL (ref 0.1–1.0)
Monocytes Relative: 9 %
Neutro Abs: 2.8 10*3/uL (ref 1.7–7.7)
Neutrophils Relative %: 44 %
Platelets: 335 10*3/uL (ref 150–400)
RBC: 3.49 MIL/uL — ABNORMAL LOW (ref 3.87–5.11)
RDW: 12.8 % (ref 11.5–15.5)
WBC: 6.4 10*3/uL (ref 4.0–10.5)
nRBC: 0 % (ref 0.0–0.2)

## 2021-01-01 NOTE — Progress Notes (Signed)
RN and mobility tech attempted to ambulate in hallway with patient this afternoon.  Pt refused and stated that she didn't understand why as she is ambulating in room throughout the day.  Will continue to monitor.

## 2021-01-01 NOTE — Progress Notes (Signed)
RN requested pt take a walk in the hallway.  Pt still sleeping in bed at this time, not ready to get up.  Will try again later.

## 2021-01-01 NOTE — Progress Notes (Signed)
Mobility Specialist - Progress Note   01/01/21 1449  Mobility  Activity Refused mobility   Asked by RN to ambulate pt. Pt was very irritated and refused even a short bout of ambulation.   Mamie Levers Mobility Specialist Mobility Specialist Phone: [phone]

## 2021-01-01 NOTE — Progress Notes (Signed)
[facility] PROGRESS NOTE  [name] ZDG:[id number] DOB: November 06, 1991 DOA: 12/19/2020 PCP: Pcp, No  Status: Remains inpatient appropriate because:Unsafe d/c plan and IV treatments appropriate due to intensity of illness or inability to take PO   Dispo: The patient is from: Home              Anticipated d/c is to: Home              Anticipated d/c date is: > 3 days anticipated discharge date 01/04/2021              Patient currently is not medically stable to d/c.  Barriers to discharge: Patient needs at least 5 more days of IV antibiotics to treat endocarditis (ID recommended a total of 14 days).  Also for history of IV drug abuse she is not an appropriate candidate for home IV antibiotics. 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 She has had similar symptoms like this previously in the past. Current episode started about 4 days ago. Initially rash started out as little pimples that sometimes pop on their own or develop into abscesses. Denied any significant fever, chills, chest pain, cough, shortness of breath, leg swelling, abdominal pain, or blood in stool. Last admitted into the hospital in May of this year for septic shock with tricuspid valve endocarditis related to strep pyrogens bacteremia. she only stayed in hospital couple days prior to leaving AGAINST MEDICAL ADVICE and never followed up with any provider. She claims to be clean from IV drugs for 120 days after getting out of jail on  August 19.  Upon admission into the emergency department patient was hemodynamically stable other than mild tachycardia. Urine drug screen was positive for amphetamineshowever she claims that she takes Adderall which is prescribed. Chest x-ray showed no acute abnormalities. ID consulted and recommended checking 3 sets of blood cultures starting cefazolin. Blood culture growing MRSA and staph epidermidis. Repeat blood culture from 12/22/2020 - thus far.TEE negative for any vegetations.  Subjective: Once again patient sleeping.  She was a bit irritable upon awakening.  Discussed with patient that it is suspected that because she spends more time in the bed and sleeping that her body is responding with inappropriate tachycardia when she does get up to move.  Her orthostatic vital signs are negative so she is not dehydrated.  Patient encouraged to move more frequently to avoid persistent elevated heart rate and to improve conditioning.  Objective: Vitals:   01/01/21 0948 01/01/21 0956  BP: 97/87   Pulse: 95   Resp: 16 17  Temp: 98 F (36.7 C)   SpO2: 94%     Intake/Output Summary (Last 24 hours) at 01/01/2021 1128 Last data filed at 01/01/2021 0900 Gross per 24 hour  Intake 240 ml  Output -  Net 240 ml   Filed Weights   12/19/20 1634  Weight: 56.2 kg    Exam:  Constitutional: Awakened, grouchy, no acute distress Respiratory: Posterior lung sounds clear to auscultation, stable on room air Cardiovascular: Pulse regular echocardiac with heart rates up into the 150s with activity-orthostatic vital  signs normal, heart sounds are normal S1-S2 without rubs or murmurs, no peripheral edema Abdomen: Soft with normoactive bowel sounds.  Tolerating solid diet. LBM 12/30  Skin: Multiple healed skin ulcerations about the face and neck out any perilesion erythema Neurologic: CN 2-12 grossly intact. Sensation intact, Strength 5/5 x all 4 extremities.  Psychiatric: Normal judgment and insight.  Alert and oriented x 3. Normal mood.    Assessment/Plan: Acute problems: Sepsis secondary to MRSA bacteremia without endocarditis Previously positive for group A strep (strep pyogenes) when admitted back in May with tricuspid valve endocarditis, but left AMA prior to receiving full recommended treatment.  This admission CRP was elevated at 6.3 and sedimentation rate was 40. ID recommended a total of 14 days of IV antibiotics today is day 11 with last dose due 01/04/2021 Her repeat transthoracic echo does not show any endocarditis, Blood culture growing MRSA and staph epidermidis. Repeat blood culture from 12/22/2020  thus far.  Status post TEE which does not show any endocarditis.  PICC placed this admission and recommended to be discharged  Inappropriate tachycardia Orthostatic vital signs normal Has tachycardia with activity so suspect deconditioning Patient has spent the majority of her time while here sleeping and laying in the bed with limited physical activity.  Patient has been encouraged to get up and move to help improve heart rate. Nursing staff aware as well.  They are encouraging patient to ambulate.  PT also ordered  Polysubstance abuse  Patient reportedly was clean from IV drug use for the last 120 days.  Urine drug screen was positive for amphetamines however pt reports taking Adderall.  On 1/4 discussed with patient.  She states she had previously been prescribed Adderall by [facility] but had not taken in several months.  This seems to conflict with history obtained from patient and admission who reports that her UDS was positive for amphetamines secondary to use of prescribed Adderall. Patient encouraged to follow-up with [facility] after discharge to resume Adderall since this may assist in maintaining sobriety.  Unknown skin lesions, possibly carbuncles Patient presents with complaints of multiple skin ulcerations mostly concentrated on the face and neck. Likely secondary to  Staphylococcus.Worked up by ID, RPR and treponema neg  Mild left preseptal cellulitis ID was concerned about possible osteomyelitis Patient has no visual symptoms or deep pain in the left eye. Per ID recommendation, ophthalmology was consulted and pt is currently treated for preseptal cellulitis on current abx. Patient continues to improve every day. No problem with vision.  Asymptomatic bacteriuria Cont to hold off on antibiotics specific for UTI at this time  ? Hepatitis C Per ID: "Her Hep C RNA was (-) on 12-26. I am doubtful that this is accurate as she prev had VL in the 100s of thousands. She denies treatment.  I have re-ordered this.  She can f/u in ID clinic for her bacteremia and potential Hep C treatment.  Will order Hep A and B vaccines.  elastogram can not be done inpt. Her prev u/s from 04-2020 was (-) for strutural abn" LFTs stable   Other problems: Hyponatremia -Normalized  Mild AKI Secondary to sepsis.  Resolved.  Cont to follow bmet trends   Data Reviewed: Basic Metabolic Panel: Recent Labs  Lab 12/26/20 0530 12/29/20 0500 01/01/21 0427  NA 135 135 136  K 4.1 4.0 3.9  CL 101 101 103  CO2 26 26 25   GLUCOSE 97 89 103*  BUN 8 7 11   CREATININE 0.82 0.72 0.90  CALCIUM  9.4 8.9 8.9   Liver Function Tests: Recent Labs  Lab 12/26/20 0530  AST 70*  ALT 90*  ALKPHOS 56  BILITOT 0.4  PROT 6.4*  ALBUMIN 3.0*   No results for input(s): LIPASE, AMYLASE in the last 168 hours. No results for input(s): AMMONIA in the last 168 hours. CBC: Recent Labs  Lab 12/28/20 0538 12/29/20 0500 12/30/20 0500 12/31/20 0534 01/01/21 0427  WBC 6.9 7.5 7.2 6.4 6.4  NEUTROABS 3.3 3.4 3.6 2.6 2.8  HGB 10.9* 11.1* 11.0* 11.2* 11.1*  HCT 33.9* 34.3* 34.4* 34.7* 32.8*  MCV 95.5 95.8 95.6 96.4 94.0  PLT 376 381 342 320 335   Cardiac Enzymes: No results for input(s): CKTOTAL, CKMB, CKMBINDEX, TROPONINI in the last 168 hours. BNP (last 3 results) No results for  input(s): BNP in the last 8760 hours.  ProBNP (last 3 results) No results for input(s): PROBNP in the last 8760 hours.  CBG: No results for input(s): GLUCAP in the last 168 hours.  No results found for this or any previous visit (from the past 240 hour(s)).   Studies: No results found.  Scheduled Meds: . Chlorhexidine Gluconate Cloth  6 each Topical Daily  . enoxaparin (LOVENOX) injection  40 mg Subcutaneous Q24H  . nicotine  14 mg Transdermal Daily  . sodium chloride flush  10-40 mL Intracatheter Q12H  . sodium chloride flush  3 mL Intravenous Q12H   Continuous Infusions: . sodium chloride 10 mL/hr at 12/20/20 2115  . vancomycin 1,250 mg (01/01/21 0528)    Principal Problem:   Sepsis ([place]) Active Problems:   Hepatitis C, chronic, maternal, antepartum ([place])   Cigarette smoker   Polysubstance dependence including opioid type drug without complication, episodic abuse ([place])   Endocarditis   Rash   Bacteremia   Consultants:  ID  Allergy  Procedures:  TEE  Antibiotics: Anti-infectives (From admission, onward)   Start     Dose/Rate Route Frequency Ordered Stop   12/24/20 0600  vancomycin (VANCOREADY) IVPB 1250 mg/250 mL        1,250 mg 166.7 mL/hr over 90 Minutes Intravenous Every 12 hours 12/23/20 1723     12/23/20 1800  vancomycin (VANCOREADY) IVPB 500 mg/100 mL        500 mg 100 mL/hr over 60 Minutes Intravenous  Once 12/23/20 1724 12/23/20 1851   12/21/20 1600  vancomycin (VANCOREADY) IVPB 750 mg/150 mL  Status:  Discontinued        750 mg 150 mL/hr over 60 Minutes Intravenous Every 12 hours 12/21/20 0249 12/23/20 1723   12/21/20 0300  vancomycin (VANCOCIN) IVPB 1000 mg/200 mL premix        1,000 mg 200 mL/hr over 60 Minutes Intravenous STAT 12/21/20 0249 12/21/20 0711   12/20/20 0600  ceFAZolin (ANCEF) IVPB 1 g/50 mL premix  Status:  Discontinued        1 g 100 mL/hr over 30 Minutes Intravenous Every 8 hours 12/19/20 2254 12/21/20 0242       Time  spent: 20 minutes    Erin Hearing ANP  [facility] 7 am - 330 pm/M-F 12 days

## 2021-01-02 DIAGNOSIS — R7881 Bacteremia: Secondary | ICD-10-CM | POA: Diagnosis not present

## 2021-01-02 DIAGNOSIS — I38 Endocarditis, valve unspecified: Secondary | ICD-10-CM | POA: Diagnosis not present

## 2021-01-02 DIAGNOSIS — R000 Tachycardia, unspecified: Secondary | ICD-10-CM | POA: Diagnosis not present

## 2021-01-02 DIAGNOSIS — F1920 Other psychoactive substance dependence, uncomplicated: Secondary | ICD-10-CM | POA: Diagnosis not present

## 2021-01-02 LAB — VANCOMYCIN, TROUGH: Vancomycin Tr: 15 ug/mL (ref 15–20)

## 2021-01-02 MED ORDER — IBUPROFEN 400 MG PO TABS: 400.0000 mg | ORAL_TABLET | Freq: Four times a day (QID) | ORAL | 0 refills | Status: AC | PRN

## 2021-01-02 MED ORDER — VANCOMYCIN HCL 1250 MG/250ML IV SOLN
1250.0000 mg | Freq: Two times a day (BID) | INTRAVENOUS | Status: AC
  Administered 2021-01-02 – 2021-01-03 (×2): 1250 mg via INTRAVENOUS
  Filled 2021-01-02 (×2): qty 250

## 2021-01-02 MED ORDER — ONDANSETRON HCL 4 MG PO TABS: 4.0000 mg | ORAL_TABLET | Freq: Four times a day (QID) | ORAL | 0 refills | Status: DC | PRN

## 2021-01-02 MED ORDER — LINEZOLID 600 MG PO TABS: 600.0000 mg | ORAL_TABLET | Freq: Two times a day (BID) | ORAL | 0 refills | Status: DC

## 2021-01-02 MED ORDER — NICOTINE 14 MG/24HR TD PT24: 14.0000 mg | MEDICATED_PATCH | Freq: Every day | TRANSDERMAL | 0 refills | Status: DC

## 2021-01-02 MED ORDER — ORITAVANCIN DIPHOSPHATE 400 MG IV SOLR
1200.0000 mg | Freq: Once | INTRAVENOUS | Status: AC
  Administered 2021-01-03: 1200 mg via INTRAVENOUS
  Filled 2021-01-02: qty 120

## 2021-01-02 MED ORDER — ACETAMINOPHEN 325 MG PO TABS: 650.0000 mg | ORAL_TABLET | Freq: Four times a day (QID) | ORAL | Status: AC | PRN

## 2021-01-02 NOTE — Progress Notes (Signed)
PT Cancellation Note  Patient Details Name: [name] MRN: [id number] DOB: Jun 24, 1991   Cancelled Treatment:    Reason Eval/Treat Not Completed: (P) Patient declined, no reason specified Pt is still asleep and RN reports pt will definitely refuse if she has to be woken up. PT will follow back this afternoon as able.   Nathin Saran B. Beverely Risen PT, DPT Acute Rehabilitation Services Pager [phone] Office [phone]   Elon Alas Fleet 01/02/2021, 11:41 AM

## 2021-01-02 NOTE — Progress Notes (Signed)
Pharmacy Antibiotic Note  [name] is a 30 y.o. female admitted on 12/19/2020 with recurrent skin infections found to have MRSA bacteremia. 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    vanc tr within goal this am  Renal fx remains stable  Height: 5' (152.4 cm) Weight: 56.2 kg (124 lb) IBW/kg (Calculated) : 45.5  Temp (24hrs), Avg:98.2 F (36.8 C), Min:97.8 F (36.6 C), Max:98.5 F (36.9 C)  Recent Labs  Lab 12/28/20 0538 12/29/20 0500 12/30/20 0500 12/31/20 0534 01/01/21 0427 01/02/21 0629  WBC 6.9 7.5 7.2 6.4 6.4  --   CREATININE  --  0.72  --   --  0.90  --   VANCOTROUGH  --   --   --   --   --  15    Estimated Creatinine Clearance: 71.9 mL/min (by C-G formula based on SCr of 0.9 mg/dL).    Allergies  Allergen Reactions  . Dextromethorphan Swelling    Facial swelling    Plan: Continue vancomycin 1250mg  q12h - planned end date 1/8 Unsure definitive plan after that so reached out to ID pharmacist to clarify  3/8, PharmD, BCPS, BCCCP Clinical Pharmacist  Please check AMION for all [facility] Pharmacy numbers  01/02/2021 8:03 AM

## 2021-01-02 NOTE — Progress Notes (Signed)
ID Pharmacy Progress Note   29YF admitted for MRSA/MRSE bacteremia with previous history of untreated GAS TV endocarditis and polysubstance abuse (including IV heroin). She presented to this admission with multiple skin lesions concerning for cellulitis. ID previously recommended treating her with 4 weeks MRSA therapy for her bacteremia given extensive history - 2 weeks of vancomycin followed by an alternative outpatient regimen. Bactrim would not be an option for this patient as the MRSA isolate is resistant.   ID team recommends providing a 1-time dose of oritavancin 1200mg  while patient is admitted prior to discharge and prescribing linezolid 600mg  twice daily x 2 weeks at discharge in addition. Though oritavancin will last for ~7-10 days, the team would prefer sending patient home on a simplified oral therapy. This can be filled through the transitions of care pharmacy as needed. Patient is insured with Medicaid.   Thank you for the consult,  , PharmD PGY2 ID Pharmacy Resident [phone]

## 2021-01-02 NOTE — Plan of Care (Signed)
  Problem: Clinical Measurements: Goal: Will remain free from infection Outcome: Progressing Goal: Respiratory complications will improve Outcome: Progressing Goal: Cardiovascular complication will be avoided Outcome: Progressing   

## 2021-01-02 NOTE — Progress Notes (Addendum)
[facility] PROGRESS NOTE  [name] YPP:[id number] DOB: 03/14/1991 DOA: 12/19/2020 PCP: Pcp, No  Status: Remains inpatient appropriate because:Unsafe d/c plan and IV treatments appropriate due to intensity of illness or inability to take PO   Dispo: The patient is from: Home              Anticipated d/c is to: Home              Anticipated d/c date is: > 3 days anticipated discharge date 01/04/2021              Patient currently is not medically stable to d/c.  Barriers to discharge: Patient needs at least 5 more days of IV antibiotics to treat endocarditis (ID recommended a total of 14 days).  Also for history of IV drug abuse she is not an appropriate candidate for home IV antibiotics. 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 She has had similar symptoms like this previously in the past. Current episode started about 4 days ago. Initially rash started out as little pimples that sometimes pop on their own or develop into abscesses. Denied any significant fever, chills, chest pain, cough, shortness of breath, leg swelling, abdominal pain, or blood in stool. Last admitted into the hospital in May of this year for septic shock with tricuspid valve endocarditis related to strep pyrogens bacteremia. she only stayed in hospital couple days prior to leaving AGAINST MEDICAL ADVICE and never followed up with any provider. She claims to be clean from IV drugs for 120 days after getting out of jail on  August 19.  Upon admission into the emergency department patient was hemodynamically stable other than mild tachycardia. Urine drug screen was positive for amphetamineshowever she claims that she takes Adderall which is prescribed. Chest x-ray showed no acute abnormalities. ID consulted and recommended checking 3 sets of blood cultures starting cefazolin. Blood culture growing MRSA and staph epidermidis. Repeat blood culture from 12/22/2020 - thus far.TEE negative for any vegetations.  Subjective: Sleeping soundly and did not awaken.  Orders placed on 1/5 to encourage patient to mobilize.  Every attempt by staff to get patient up and mobilize has been unsuccessful with patient refusing.  She states she is walking around in her room already and does not need anybody to walk around.  Objective: Vitals:   01/02/21 0532 01/02/21 0816  BP: (!) 90/59 (!) 87/55  Pulse: 79 76  Resp: 15 16  Temp: 97.8 F (36.6 C) 98 F (36.7 C)  SpO2: 98% 98%    Intake/Output Summary (Last 24 hours) at 01/02/2021 1143 Last data filed at 01/02/2021 0127 Gross per 24 hour  Intake 406.7 ml  Output --  Net 406.7 ml   Filed Weights   12/19/20 1634  Weight: 56.2 kg    Exam:  Constitutional: Sleeping and did not spontaneously awaken during exam Respiratory: Posterior lung sounds clear to auscultation, stable on room air Cardiovascular: Pulse regular with no resting tachycardia, heart sounds are normal S1-S2 without rubs or murmurs, no peripheral edema Abdomen: Soft with normoactive bowel sounds.  LBM 12/30  Neurologic: Sleeping but at baseline CN 2-12 grossly intact. Sensation intact, Strength 5/5 x all 4 extremities.  Psychiatric: Sleeping but at baseline normal judgment and insight. Alert and oriented x 3. Normal mood.    Assessment/Plan: Acute problems: Sepsis secondary to MRSA bacteremia without endocarditis Previously positive for group A strep (strep pyogenes) when admitted back in May with  tricuspid valve endocarditis, but left AMA prior to receiving full recommended treatment.  This admission CRP was elevated at 6.3 and sedimentation rate was 40. ID recommended a total of 14 days of IV antibiotics today is day 11 with last dose due 01/04/2021 Her repeat transthoracic echo does not show any endocarditis, Blood culture growing MRSA and staph epidermidis. Repeat blood culture from 12/22/2020  thus far.  Status post TEE which does not show any endocarditis.  PICC placed this admission and recommended to be removed at time of discharge Discussed with pharmacist-patient will receive long-acting oritavancin on 1/7 then 1 week later begin linezolid 600 mg PO BID for 7 days and then she will have completed antibiotic treatment.  Inappropriate tachycardia Orthostatic vital signs normal Has tachycardia with activity so suspect deconditioning Patient has spent the majority of her time while here sleeping and laying in the bed with limited physical activity.  Patient has been encouraged to get up and move to help improve heart rate. Nursing staff aware as well.  They are encouraging patient to ambulate.  PT also ordered  Polysubstance abuse  Patient reportedly was clean from IV drug use for the last 120 days.  Urine drug screen was positive for amphetamines however pt reports taking Adderall.  On 1/4 discussed with patient.  She states she had previously been prescribed Adderall by [facility] but had not taken in several months.  This seems to conflict with history obtained from patient and admission who reports that her UDS was positive for amphetamines secondary to use of prescribed Adderall. Patient encouraged to follow-up with [facility] after discharge to resume Adderall since this may assist in maintaining sobriety.   Mild left preseptal cellulitis ID was concerned about possible osteomyelitis Patient has no visual symptoms or deep pain in the left eye. Per ID recommendation, ophthalmology  was consulted and pt is currently treated for preseptal cellulitis on current abx. Patient continues to improve every day. No problem with vision.  ? Hepatitis C Per ID: "Her Hep C RNA was (-) on 12-26. I am doubtful that this is accurate as she prev had VL in the 100s of thousands. She denies treatment.  I have re-ordered this.  She can f/u in ID clinic for her bacteremia and potential Hep C treatment.  Will order Hep A and B vaccines.  elastogram can not be done inpt. Her prev u/s from 04-2020 was (-) for strutural abn" LFTs stable   Other problems: Hyponatremia -Normalized  Mild AKI Secondary to sepsis.  Resolved.  Cont to follow bmet trends  Unknown skin lesions, possibly carbuncles Patient presents with complaints of multiple skin ulcerations mostly concentrated on the face and neck. Likely secondary to Staphylococcus.Worked up by ID, RPR and treponema neg  Asymptomatic bacteriuria Cont to hold off on antibiotics specific for UTI at this time  Data Reviewed: Basic Metabolic Panel: Recent Labs  Lab 12/29/20 0500 01/01/21 0427  NA 135 136  K 4.0 3.9  CL 101 103  CO2 26 25  GLUCOSE 89 103*  BUN 7 11  CREATININE 0.72 0.90  CALCIUM 8.9 8.9   Liver Function Tests: No  results for input(s): AST, ALT, ALKPHOS, BILITOT, PROT, ALBUMIN in the last 168 hours. No results for input(s): LIPASE, AMYLASE in the last 168 hours. No results for input(s): AMMONIA in the last 168 hours. CBC: Recent Labs  Lab 12/28/20 0538 12/29/20 0500 12/30/20 0500 12/31/20 0534 01/01/21 0427  WBC 6.9 7.5 7.2 6.4 6.4  NEUTROABS 3.3 3.4 3.6 2.6 2.8  HGB 10.9* 11.1* 11.0* 11.2* 11.1*  HCT 33.9* 34.3* 34.4* 34.7* 32.8*  MCV 95.5 95.8 95.6 96.4 94.0  PLT 376 381 342 320 335   Cardiac Enzymes: No results for input(s): CKTOTAL, CKMB, CKMBINDEX, TROPONINI in the last 168 hours. BNP (last 3 results) No results for input(s): BNP in the last 8760 hours.  ProBNP (last 3 results) No results for  input(s): PROBNP in the last 8760 hours.  CBG: No results for input(s): GLUCAP in the last 168 hours.  No results found for this or any previous visit (from the past 240 hour(s)).   Studies: No results found.  Scheduled Meds: . Chlorhexidine Gluconate Cloth  6 each Topical Daily  . enoxaparin (LOVENOX) injection  40 mg Subcutaneous Q24H  . nicotine  14 mg Transdermal Daily  . sodium chloride flush  10-40 mL Intracatheter Q12H  . sodium chloride flush  3 mL Intravenous Q12H   Continuous Infusions: . sodium chloride 10 mL/hr at 12/20/20 2115  . vancomycin 1,250 mg (01/02/21 0755)    Principal Problem:   Sepsis ([facility]) Active Problems:   Hepatitis C, chronic, maternal, antepartum ([facility])   Cigarette smoker   Polysubstance dependence including opioid type drug without complication, episodic abuse ([facility])   Endocarditis   Rash   Bacteremia   Inappropriate sinus tachycardia   Consultants:  ID  Allergy  Procedures:  TEE  Antibiotics: Anti-infectives (From admission, onward)   Start     Dose/Rate Route Frequency Ordered Stop   12/24/20 0600  vancomycin (VANCOREADY) IVPB 1250 mg/250 mL        1,250 mg 166.7 mL/hr over 90 Minutes Intravenous Every 12 hours 12/23/20 1723     12/23/20 1800  vancomycin (VANCOREADY) IVPB 500 mg/100 mL        500 mg 100 mL/hr over 60 Minutes Intravenous  Once 12/23/20 1724 12/23/20 1851   12/21/20 1600  vancomycin (VANCOREADY) IVPB 750 mg/150 mL  Status:  Discontinued        750 mg 150 mL/hr over 60 Minutes Intravenous Every 12 hours 12/21/20 0249 12/23/20 1723   12/21/20 0300  vancomycin (VANCOCIN) IVPB 1000 mg/200 mL premix        1,000 mg 200 mL/hr over 60 Minutes Intravenous STAT 12/21/20 0249 12/21/20 0711   12/20/20 0600  ceFAZolin (ANCEF) IVPB 1 g/50 mL premix  Status:  Discontinued        1 g 100 mL/hr over 30 Minutes Intravenous Every 8 hours 12/19/20 2254 12/21/20 0242       Time spent: 20 minutes    Junious Silk  ANP  [facility] 7 am - 330 pm/M-F 13 days

## 2021-01-03 ENCOUNTER — Other Ambulatory Visit (HOSPITAL_COMMUNITY): Payer: Self-pay | Admitting: Nurse Practitioner

## 2021-01-03 DIAGNOSIS — I38 Endocarditis, valve unspecified: Secondary | ICD-10-CM | POA: Diagnosis not present

## 2021-01-03 DIAGNOSIS — R7881 Bacteremia: Secondary | ICD-10-CM | POA: Diagnosis not present

## 2021-01-03 DIAGNOSIS — F1920 Other psychoactive substance dependence, uncomplicated: Secondary | ICD-10-CM | POA: Diagnosis not present

## 2021-01-03 MED ORDER — ONDANSETRON HCL 4 MG PO TABS: 4.0000 mg | ORAL_TABLET | Freq: Four times a day (QID) | ORAL | 0 refills | Status: DC | PRN

## 2021-01-03 MED ORDER — NICOTINE 14 MG/24HR TD PT24: 14.0000 mg | MEDICATED_PATCH | Freq: Every day | TRANSDERMAL | 0 refills | Status: DC

## 2021-01-03 MED ORDER — LINEZOLID 600 MG PO TABS: 600.0000 mg | ORAL_TABLET | Freq: Two times a day (BID) | ORAL | 0 refills | Status: DC

## 2021-01-03 MED FILL — ONDANSETRON HCL 4 MG TABLET: 4 | 5 days supply | Qty: 20 | Fill #0

## 2021-01-03 MED FILL — NICOTINE 14 MG/24HR PATCH: 14 | 28 days supply | Qty: 28 | Fill #0

## 2021-01-03 MED FILL — LINEZOLID 600 MG TABS: 600 | 7 days supply | Qty: 14 | Fill #0

## 2021-01-03 NOTE — TOC Initial Note (Addendum)
Transition of Care [facility]) - Initial/Assessment Note    Patient Details  Name: [name] MRN: [id number] Date of Birth: Aug 30, 1991  [facility] CM/SW Contact:    Holly Labrum, RN Phone Number: 01/03/2021, 1:18 PM  Clinical Narrative:                 Case management met with the patient at the bedside in regards to transitions of care to home tomorrow, 01/04/2021.  The patient currently lives with her father in [place], [place] at [address], [address], [place], [place] [address] and plans to discharge home with him tomorrow.  She is aware of her planned discharge home tomorrow and should receive medications from [facility] today.  The patient states that her current PCP is Dr. Redmond [name] and has plans for drug counseling followup continued with [facility].  Spoke with [name], [place] and they are going to fill the patient's medications and lock them up in the main [facility] pharmacy until patient is discharged home tomorrow.  The patient's RN was notified of this today so that the 01/04/2021 primary RN is aware.  TOC team will continue to follow the patient for discharge needs.  Expected Discharge Plan: Home/Self Care Barriers to Discharge: Continued Medical Work up (Patient will be discharged home with her father on 01/03/2021.)   Patient Goals and CMS Choice Patient states their goals for this hospitalization and ongoing recovery are:: Patient plans to discharge home with her father, [name] tomorrow - 01/03/2021. CMS Medicare.gov Compare Post Acute Care list provided to:: Patient Choice offered to / list presented to : Patient  Expected Discharge Plan and Services Expected Discharge Plan: Home/Self Care In-house Referral: Clinical Social [facility] (Patient's current PCP, Dr. Redmond [name]) Discharge Planning Services: CM Consult,Medication Assistance (medications through [facility])   Living arrangements for the past 2 months: Apartment (Patient lives with  Dad at [address] [place], [place], [place] [address])                                      Prior Living Arrangements/Services Living arrangements for the past 2 months: Apartment (Patient lives with Dad at [address], [place], [place], [place] [address]) Lives with:: Parents (lives with father, [name]) Patient language and need for interpreter reviewed:: Yes Do you feel safe going back to the place where you live?: Yes      Need for Family Participation in Patient Care: Yes (Comment) Care giver support system in place?: Yes (comment)   Criminal Activity/Legal Involvement Pertinent to Current Situation/Hospitalization: No - Comment as needed  Activities of Daily Living Home Assistive Devices/Equipment: None ADL Screening (condition at time of admission) Patient's cognitive ability adequate to safely complete daily activities?: Yes Is the patient deaf or have difficulty hearing?: No Does the patient have difficulty seeing, even when wearing glasses/contacts?: No Does the patient have difficulty concentrating, remembering, or making decisions?: No Patient able to express need for assistance with ADLs?: Yes Does the patient have difficulty dressing or bathing?: No Independently performs ADLs?: No Does the patient have difficulty walking or climbing stairs?: No Weakness of Legs: None Weakness of Arms/Hands: None  Permission Sought/Granted Permission sought to share information with : Case Manager Permission granted to share information with : Yes, Verbal Permission Granted     Permission granted to share info w AGENCY: [facility]  Permission granted  to share info w Relationship: father, [name] - [phone]     Emotional Assessment Appearance:: Appears stated [name] Attitude/Demeanor/Rapport: Engaged Affect (typically observed): Accepting Orientation: : Oriented to Self,Oriented to Place,Oriented to  Time,Oriented to Situation Alcohol / Substance Use:  Illicit Drugs Psych Involvement: No (comment)  Admission diagnosis:  Endocarditis [I38] Leukocytosis, unspecified type [D72.829] Endocarditis, unspecified chronicity, unspecified endocarditis type [I38] Patient Active Problem List   Diagnosis Date Noted  . Inappropriate sinus tachycardia   . Bacteremia   . Rash 12/21/2020  . Endocarditis 12/20/2020  . Malnutrition of moderate degree 05/13/2020  . Septic shock ([place]) 05/11/2020  . 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 Iron deficiency anemia due to chronic blood loss 10/29/2019  . MRSA bacteremia 10/27/2019  . Injection of illicit drug within last 12 months 10/27/2019  . Sepsis ([place]) 10/26/2019  . Septic embolism ([place]) 10/26/2019  . Cavitary pneumonia 10/26/2019  . 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 Substance induced mood disorder ([employer]) 04/19/2018  . Polysubstance (including opioids) dependence, daily use ([place]) 02/03/2018  . Polysubstance dependence including opioid type drug without complication, episodic abuse ([place]) 12/16/2017  . Major depressive disorder, recurrent severe without psychotic features ([place]) 12/16/2017  . Normal labor 10/16/2016  . Cigarette smoker 08/18/2016  . Pregnancy complicated by subutex maintenance, antepartum ([place]) 08/04/2016  . Hepatitis C, chronic, maternal, antepartum ([place]) 08/04/2016  . Supervision of high-risk pregnancy 07/21/2016  . ASCUS with positive high risk HPV 07/21/2016   PCP:  Pcp, No Pharmacy:   [id number] [place] [id number]: [phone]: [address] [address]Phone: [phone]: [phone]    Social Determinants of Health (SDOH) Interventions    Readmission Risk Interventions Readmission Risk Prevention Plan 01/03/2021 01/02/2021  Post Dischage Appt Complete Complete   Medication Screening Complete Complete  Transportation Screening Complete Complete  Some recent data might be hidden

## 2021-01-03 NOTE — Progress Notes (Signed)
[facility] PROGRESS NOTE  [name] IWL:[id number] DOB: 11/29/1991 DOA: 12/19/2020 PCP: Pcp, No  Status: Remains inpatient appropriate because:Unsafe d/c plan and IV treatments appropriate due to intensity of illness or inability to take PO   Dispo: The patient is from: Home              Anticipated d/c is to: Home              Anticipated d/c date is: > 3 days anticipated discharge date 01/04/2021              Patient currently is not medically stable to d/c.  Barriers to discharge: Patient needs at least 5 more days of IV antibiotics to treat endocarditis (ID recommended a total of 14 days).  Also for history of IV drug abuse she is not an appropriate candidate for home IV antibiotics. 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 She has had similar symptoms like this previously in the past. Current episode started about 4 days ago. Initially rash started out as little pimples that sometimes pop on their own or develop into abscesses. Denied any significant fever, chills, chest pain, cough, shortness of breath, leg swelling, abdominal pain, or blood in stool. Last admitted into the hospital in May of this year for septic shock with tricuspid valve endocarditis related to strep pyrogens bacteremia. she only stayed in hospital couple days prior to leaving AGAINST MEDICAL ADVICE and never followed up with any provider. She claims to be clean from IV drugs for 120 days after getting out of jail on  August 19.  Upon admission into the emergency department patient was hemodynamically stable other than mild tachycardia. Urine drug screen was positive for amphetamineshowever she claims that she takes Adderall which is prescribed. Chest x-ray showed no acute abnormalities. ID consulted and recommended checking 3 sets of blood cultures starting cefazolin. Blood culture growing MRSA and staph epidermidis. Repeat blood culture from 12/22/2020 - thus far.TEE negative for any vegetations.  Subjective: Sleeping soundly and did not awaken.  Nurse CM returned to room and patient was awake and pleasant and interactive.  Patient was updated on discharge plans for 1/8.  Patient without any specific complaints.  Objective: Vitals:   01/03/21 1043 01/03/21 1254  BP: (!) 95/55 94/64  Pulse: 81 91  Resp: 17 14  Temp: 98.1 F (36.7 C) 97.6 F (36.4 C)  SpO2: 96% 95%    Intake/Output Summary (Last 24 hours) at 01/03/2021 1318 Last data filed at 01/03/2021 0600 Gross per 24 hour  Intake 609.6 ml  Output -  Net 609.6 ml   Filed Weights   12/19/20 1634  Weight: 56.2 kg    Exam:  Constitutional: Sleeping and did not spontaneously awaken during exam Respiratory: Posterior lung sounds clear to auscultation, stable on room air Cardiovascular: Pulse regular with no resting tachycardia, heart sounds are normal S1-S2 without rubs or murmurs, no peripheral edema Abdomen: Soft with normoactive bowel sounds.  LBM 12/30  Neurologic: Sleeping but at baseline CN 2-12 grossly intact. Sensation intact, Strength 5/5 x all 4 extremities.  Psychiatric: Sleeping but at baseline normal judgment and insight. Alert and oriented x 3. Normal mood.    Assessment/Plan: Acute problems: Sepsis secondary to MRSA bacteremia without endocarditis Previously positive for group A strep (strep pyogenes) when admitted back in May with tricuspid valve endocarditis, but left AMA prior to receiving full recommended treatment.   This admission CRP was elevated at 6.3 and sedimentation rate was 40. ID recommended a total of 14 days of IV antibiotics completed vancomycin on 1/6.  Patient will be given a dose of oritavancin and then in 1 week she will begin linezolid 600 mg p.o. BID for the next 7 days Her repeat transthoracic echo does not show any endocarditis, Blood culture growing MRSA and staph epidermidis. Repeat blood culture from 12/22/2020  thus far.  Status post TEE which does not show any endocarditis.  PICC placed this admission and recommended to be removed at time of discharge  Inappropriate tachycardia Orthostatic vital signs normal Has tachycardia with activity so suspect deconditioning Patient has spent the majority of her time while here sleeping and laying in the bed with limited physical activity.  Patient has been encouraged to get up and move to help improve heart rate.  Polysubstance abuse  Patient reportedly was clean from IV drug use for the last 120 days.  Urine drug screen was positive for amphetamines however pt reports taking Adderall.  On 1/4 discussed with patient.  She states she had previously been prescribed Adderall by [facility] but had not taken in several months.  This seems to conflict with history obtained from patient and admission who reports that her UDS was positive for amphetamines secondary to use of prescribed Adderall. Patient encouraged to follow-up with [facility] after discharge to resume Adderall since this may assist in maintaining sobriety.   Mild left preseptal cellulitis ID was concerned about possible osteomyelitis Patient has no visual symptoms or deep pain in the left eye. Per ID recommendation, ophthalmology was consulted and pt is currently treated for preseptal cellulitis on current abx. Patient continues to improve every day. No problem with vision.  ? Hepatitis C Per ID: "Her Hep C RNA was (-) on 12-26. I am doubtful that this is accurate as she prev had VL  in the 100s of thousands. She denies treatment.  I have re-ordered this.  She can f/u in ID clinic for her bacteremia and potential Hep C treatment.  Will order Hep A and B vaccines.  elastogram can not be done inpt. Her prev u/s from 04-2020 was (-) for strutural abn" LFTs stable   Other problems: Hyponatremia -Normalized  Mild AKI Secondary to sepsis.  Resolved.  Cont to follow bmet trends  Unknown skin lesions, possibly carbuncles Patient presents with complaints of multiple skin ulcerations mostly concentrated on the face and neck. Likely secondary to Staphylococcus.Worked up by ID, RPR and treponema neg  Asymptomatic bacteriuria Cont to hold off on antibiotics specific for UTI at this time  Data Reviewed: Basic Metabolic Panel: Recent Labs  Lab 12/29/20 0500 01/01/21 0427  NA 135 136  K 4.0 3.9  CL 101 103  CO2 26 25  GLUCOSE 89 103*  BUN 7 11  CREATININE 0.72 0.90  CALCIUM 8.9 8.9   Liver Function Tests: No results for input(s): AST, ALT, ALKPHOS, BILITOT, PROT, ALBUMIN in the last 168 hours. No results for input(s): LIPASE, AMYLASE in the last 168 hours. No results for input(s): AMMONIA in the last 168 hours. CBC: Recent Labs  Lab 12/28/20 [id number]  12/29/20 0500 12/30/20 0500 12/31/20 0534 01/01/21 0427  WBC 6.9 7.5 7.2 6.4 6.4  NEUTROABS 3.3 3.4 3.6 2.6 2.8  HGB 10.9* 11.1* 11.0* 11.2* 11.1*  HCT 33.9* 34.3* 34.4* 34.7* 32.8*  MCV 95.5 95.8 95.6 96.4 94.0  PLT 376 381 342 320 335   Cardiac Enzymes: No results for input(s): CKTOTAL, CKMB, CKMBINDEX, TROPONINI in the last 168 hours. BNP (last 3 results) No results for input(s): BNP in the last 8760 hours.  ProBNP (last 3 results) No results for input(s): PROBNP in the last 8760 hours.  CBG: No results for input(s): GLUCAP in the last 168 hours.  No results found for this or any previous visit (from the past 240 hour(s)).   Studies: No results found.  Scheduled Meds: . Chlorhexidine Gluconate  Cloth  6 each Topical Daily  . enoxaparin (LOVENOX) injection  40 mg Subcutaneous Q24H  . nicotine  14 mg Transdermal Daily  . sodium chloride flush  10-40 mL Intracatheter Q12H  . sodium chloride flush  3 mL Intravenous Q12H   Continuous Infusions: . sodium chloride 10 mL/hr at 12/20/20 2115  . oritavancin (ORBACTIV) IVPB      Principal Problem:   Sepsis (HCC) Active Problems:   Hepatitis C, chronic, maternal, antepartum (HCC)   Cigarette smoker   Polysubstance dependence including opioid type drug without complication, episodic abuse (H[facility])   Endocarditis   Rash   Bacteremia   Inappropriate sinus tachycardia   Consultants:  ID  Allergy  Procedures:  TEE  Antibiotics: Anti-infectives (From admission, onward)   Start     Dose/Rate Route Frequency Ordered Stop   01/11/21 0000  linezolid (ZYVOX) 600 MG tablet  Status:  Discontinued        600 mg Oral 2 times daily 01/02/21 1515 01/03/21    01/11/21 0000  linezolid (ZYVOX) 600 MG tablet        600 mg Oral 2 times daily 01/03/21 1151 01/18/21 2359   01/03/21 1800  Oritavancin Diphosphate (ORBACTIV) 1,200 mg in dextrose 5 % IVPB        1,200 mg 333.3 mL/hr over 180 Minutes Intravenous Once 01/02/21 1541     01/02/21 1800  vancomycin (VANCOREADY) IVPB 1250 mg/250 mL        1,250 mg 166.7 mL/hr over 90 Minutes Intravenous Every 12 hours 01/02/21 1541 01/03/21 0646   12/24/20 0600  vancomycin (VANCOREADY) IVPB 1250 mg/250 mL  Status:  Discontinued        1,250 mg 166.7 mL/hr over 90 Minutes Intravenous Every 12 hours 12/23/20 1723 01/02/21 1541   12/23/20 1800  vancomycin (VANCOREADY) IVPB 500 mg/100 mL        500 mg 100 mL/hr over 60 Minutes Intravenous  Once 12/23/20 1724 12/23/20 1851   12/21/20 1600  vancomycin (VANCOREADY) IVPB 750 mg/150 mL  Status:  Discontinued        750 mg 150 mL/hr over 60 Minutes Intravenous Every 12 hours 12/21/20 0249 12/23/20 1723   12/21/20 0300  vancomycin (VANCOCIN) IVPB 1000 mg/200 mL  premix        1,000 mg 200 mL/hr over 60 Minutes Intravenous STAT 12/21/20 0249 12/21/20 0711   12/20/20 0600  ceFAZolin (ANCEF) IVPB 1 g/50 mL premix  Status:  Discontinued        1 g 100 mL/hr over 30 Minutes Intravenous Every 8 hours 12/19/20 2254 12/21/20 0242       Time spent: 20 minutes    Junious Silk ANP  [facility]  7 am - 330 pm/M-F 14 days

## 2021-01-03 NOTE — Discharge Summary (Incomplete)
Physician Discharge Summary  [name] CZY:[id number] DOB: 12/23/1991 DOA: 12/19/2020  PCP: Pcp, No  Admit date: 12/19/2020 Discharge date: 01/03/2021  Time spent: *** minutes  Recommendations for Outpatient Follow-up:  1. Patient will need to follow-up with infectious disease team 2 weeks after completion of antibiotics for repeat blood cultures and routine hospital follow-up.  ID physician also recommended she follow-up with the ID clinic regarding abnormal hepatitis C testing.  ID physician ordered hep B and hep A vaccines during this hospitalization and these were administered on 12/31 2. Patient has previously been treated for ADD at [facility] and is recommended she follow-up to determine if it is appropriate for her to resume prior Adderall 3. Patient has been encouraged to continue to increase physical activity noting that at times with activity she has inappropriate tachycardia she is suspected related to deconditioning.  She does not have any resting tachycardia.   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 She has had similar symptoms like this previously in the past. Current episode started about 4 days ago. Initially rash started out as little pimples that sometimes pop on their own or  develop into abscesses. Denied any significant fever, chills, chest pain, cough, shortness of breath, leg swelling, abdominal pain, or blood in stool. Last admitted into the hospital in May of this year for septic shock with tricuspid valve endocarditis related to strep pyrogens bacteremia. she only stayed in hospital couple days prior to leaving AGAINST MEDICAL ADVICE and never followed up with any provider. She claims to be clean from IV drugs for 120 days after getting out of jail on August 19.  Upon admission into the emergency department patient was hemodynamically stable other than mild tachycardia. Urine drug screen was positive for amphetamineshowever she claims that she takes Adderall which is prescribed. Chest x-ray showed no acute abnormalities. ID consulted and recommended checking 3 sets of blood cultures starting cefazolin. Blood culture growing MRSA and staph epidermidis. Repeat blood culture from 12/22/2020 - thus far.TEE negative for any vegetations.  Hospital Course:  Acute problems: Sepsis secondary to MRSA bacteremia without endocarditis Previously positive for group A strep (strep pyogenes) when admitted back in May with tricuspid valve endocarditis, but left AMA prior to receiving full recommended treatment.  This admission CRP was elevated at 6.3 and sedimentation rate was 40. ID recommended a total of 14 days of IV antibiotics -completed vancomycin on 1/6.  Patient will be given a dose of oritavancin and then in 1 week she will begin linezolid 600 mg p.o. BID for the next 7 days Her repeat transthoracic echo does not show any endocarditis, Blood culture growing MRSA and staph epidermidis. Repeat blood culture from 12/22/2020  thus far.  Status post TEE which does not show any endocarditis.  PICC placed this admission and recommended to be removed at time of discharge  Inappropriate tachycardia Orthostatic  vital signs were normal Exertional tachycardia -suspect  deconditioning Patient has spent the majority of her time while here sleeping and laying in the bed with limited physical activity.  Patient has been encouraged to get up and move to help improve heart rate.  Polysubstance abuse  Patient reportedly was clean from IV drug use for the last 120 days.  Urine drug screen was positive for amphetamines however pt reports taking Adderall.  On 1/4 discussed with patient.  She states she had previously been prescribed Adderall by [facility] but had not taken in several months.  This seems to conflict with history obtained from patient and admission who reports that her UDS was positive for amphetamines secondary to use of prescribed Adderall. Patient encouraged to follow-up with [facility] after discharge to resume Adderall since this may assist in maintaining sobriety.  Mild left preseptal cellulitis ID was concerned about possible osteomyelitis Patient has no visual symptoms or deep pain in the left eye. Per ID recommendation, ophthalmology was consulted and pt is currently treated for preseptal cellulitis on current abx. Patient continues to improve every day. No problem with vision.  ? Hepatitis C Per ID: "Her Hep C RNA was (-) on 12-26. I am doubtful that this is accurate as she prev had VL in the 100s of thousands. She denies treatment.  I have re-ordered this.  She can f/u in ID clinic for her bacteremia and potential Hep C treatment.  Will order Hep A and B vaccines. elastogram can not be done inpt. Her prev u/s from 04-2020 was (-) for strutural abn" LFTs stable   Other problems: Hyponatremia -Normalized  Mild AKI Secondary to sepsis.  Resolved.  Cont to follow bmet trends  Unknown skin lesions, possibly carbuncles Patient presents with complaints of multiple skin ulcerations mostly concentrated on the face and neck. Likely secondary to Staphylococcus.Worked up by ID, RPR and treponema neg  Asymptomatic bacteriuria Require  antibiotic treatment during hospitalization  Procedures:  TEE  Consultations:  ID  Ophthalmology  Discharge Exam: Vitals:   01/03/21 1043 01/03/21 1254  BP: (!) 95/55 94/64  Pulse: 81 91  Resp: 17 14  Temp: 98.1 F (36.7 C) 97.6 F (36.4 C)  SpO2: 96% 95%   Constitutional:  And in no acute distress Respiratory: Posterior lung sounds clear to auscultation, stable on room air Cardiovascular: Pulse regular with no resting tachycardia, heart sounds are normal S1-S2 without rubs or murmurs, no peripheral edema Abdomen: Soft with normoactive bowel sounds.  LBM 12/30  Neurologic: CN 2-12 grossly intact. Sensation intact, Strength 5/5 x all 4 extremities.  Psychiatric: Normal judgment and insight. Alert and oriented x 3. Normal mood.   Discharge Instructions    Allergies as of 01/03/2021      Reactions   Dextromethorphan Swelling   Facial swelling      Medication List    TAKE these medications   acetaminophen 325 MG tablet Commonly known as: TYLENOL Take 2 tablets (650 mg total) by mouth every 6 (six) hours as needed for mild pain (or Fever >/= 101).   ibuprofen 400 MG tablet Commonly known as: ADVIL Take 1 tablet (400 mg total) by mouth every 6 (six) hours as needed for moderate pain. What changed:   medication strength  how much to take  reasons to take this   linezolid 600 MG tablet Commonly known as: ZYVOX Take 1 tablet (600 mg total) by mouth 2 (two) times daily for 7 days. Start taking on: January 11, 2021  neomycin-bacitracin-polymyxin ointment Commonly known as: NEOSPORIN Apply 1 application topically 3 (three) times daily as needed for wound care. Apply to face   nicotine 14 mg/24hr patch Commonly known as: NICODERM CQ - dosed in mg/24 hours Place 1 patch (14 mg total) onto the skin daily.   ondansetron 4 MG tablet Commonly known as: ZOFRAN Take 1 tablet (4 mg total) by mouth every 6 (six) hours as needed for nausea.      Allergies   Allergen Reactions  . Dextromethorphan Swelling    Facial swelling    Follow-up Information    Ronnald Nian, MD. Schedule an appointment as soon as possible for a visit.   Specialty: Family Medicine Why: Please call and make an appointment with your primary care physician in the next 7-10 days for a hospital follow up after your discharge home from the hospital. Contact information: [address] [place] [address] [phone]        Raymondo Band, MD. Schedule an appointment as soon as possible for a visit in 3 week(s).   Specialty: Infectious Diseases Why: Call to make follow-up appointment within 3 weeks after discharge Contact information: [address] [place] [address] [phone]                The results of significant diagnostics from this hospitalization (including imaging, microbiology, ancillary and laboratory) are listed below for reference.    Significant Diagnostic Studies: DG Chest 2 View  Result Date: 12/19/2020 CLINICAL DATA:  30 year old female with tachycardia. EXAM: CHEST - 2 VIEW COMPARISON:  Chest radiograph dated 05/12/2020. FINDINGS: The heart size and mediastinal contours are within normal limits. Both lungs are clear. The visualized skeletal structures are unremarkable. IMPRESSION: No active cardiopulmonary disease. Electronically Signed   By: Elgie Collard M.D.   On: 12/19/2020 22:12   ECHOCARDIOGRAM COMPLETE  Result Date: 12/21/2020    ECHOCARDIOGRAM REPORT   Patient Name:   ESTELLAR CADENA [facility] Date of Exam: 12/21/2020 Medical Rec #:  [id number]       Height:       60.0 in Accession #:    [id number]      Weight:       124.0 lb Date of Birth:  09/24/1991       BSA:          1.523 m Patient Age:    29 years        BP:           117/79 mmHg Patient Gender: F               HR:           92 bpm. Exam Location:  Inpatient Procedure: 2D Echo Indications:    endocarditis  History:        Patient has prior history of  Echocardiogram examinations, most                 recent 05/12/2020. Sepsis. Hepatitis C.; Risk Factors:Current                 Smoker and poly substance abuse.  Sonographer:    Delcie Roch Referring Phys: [id number] RONDELL A SMITH IMPRESSIONS  1. Left ventricular ejection fraction, by estimation, is 60 to 65%. The left ventricle has normal function. The left ventricle has no regional wall motion abnormalities. Left ventricular diastolic parameters were normal.  2. Right ventricular systolic function is normal. The right ventricular size is normal. There is normal  pulmonary artery systolic pressure. The estimated right ventricular systolic pressure is 78.4 mmHg.  3. The mitral valve is grossly normal. Trivial mitral valve regurgitation. No evidence of mitral stenosis.  4. The aortic valve is tricuspid. Aortic valve regurgitation is not visualized. No aortic stenosis is present.  5. The inferior vena cava is normal in size with greater than 50% respiratory variability, suggesting right atrial pressure of 3 mmHg. Conclusion(s)/Recommendation(s): Normal biventricular function without evidence of hemodynamically significant valvular heart disease. No evidence of valvular vegetations on this transthoracic echocardiogram. Would recommend a transesophageal echocardiogram to exclude infective endocarditis if clinically indicated. FINDINGS  Left Ventricle: Left ventricular ejection fraction, by estimation, is 60 to 65%. The left ventricle has normal function. The left ventricle has no regional wall motion abnormalities. The left ventricular internal cavity size was normal in size. There is  no left ventricular hypertrophy. Left ventricular diastolic parameters were normal. Right Ventricle: The right ventricular size is normal. No increase in right ventricular wall thickness. Right ventricular systolic function is normal. There is normal pulmonary artery systolic pressure. The tricuspid regurgitant velocity is 2.74 m/s,  and  with an assumed right atrial pressure of 3 mmHg, the estimated right ventricular systolic pressure is 69.6 mmHg. Left Atrium: Left atrial size was normal in size. Right Atrium: Right atrial size was normal in size. Pericardium: Trivial pericardial effusion is present. Mitral Valve: The mitral valve is grossly normal. Trivial mitral valve regurgitation. No evidence of mitral valve stenosis. Tricuspid Valve: The tricuspid valve is grossly normal. Tricuspid valve regurgitation is mild . No evidence of tricuspid stenosis. Aortic Valve: The aortic valve is tricuspid. Aortic valve regurgitation is not visualized. No aortic stenosis is present. Pulmonic Valve: The pulmonic valve was grossly normal. Pulmonic valve regurgitation is not visualized. No evidence of pulmonic stenosis. Aorta: The aortic root and ascending aorta are structurally normal, with no evidence of dilitation. Venous: The inferior vena cava is normal in size with greater than 50% respiratory variability, suggesting right atrial pressure of 3 mmHg. IAS/Shunts: The atrial septum is grossly normal.  LEFT VENTRICLE PLAX 2D LVIDd:         3.90 cm  Diastology LVIDs:         2.40 cm  LV e' medial:    10.10 cm/s LV PW:         0.90 cm  LV E/e' medial:  9.3 LV IVS:        0.80 cm  LV e' lateral:   18.40 cm/s LVOT diam:     1.80 cm  LV E/e' lateral: 5.1 LV SV:         49 LV SV Index:   32 LVOT Area:     2.54 cm  RIGHT VENTRICLE RV S prime:     17.70 cm/s TAPSE (M-mode): 2.0 cm LEFT ATRIUM             Index       RIGHT ATRIUM           Index LA diam:        3.70 cm 2.43 cm/m  RA Area:     10.20 cm LA Vol (A2C):   27.6 ml 18.15 ml/m RA Volume:   21.20 ml  13.92 ml/m LA Vol (A4C):   29.3 ml 19.23 ml/m LA Biplane Vol: 29.8 ml 19.56 ml/m  AORTIC VALVE LVOT Vmax:   110.00 cm/s LVOT Vmean:  73.500 cm/s LVOT VTI:    0.193 m  AORTA Ao Root diam: 2.40  cm Ao Asc diam:  2.30 cm MITRAL VALVE               TRICUSPID VALVE MV Area (PHT): 3.85 cm    TR Peak grad:    30.0 mmHg MV Decel Time: 197 msec    TR Vmax:        274.00 cm/s MV E velocity: 94.10 cm/s MV A velocity: 61.80 cm/s  SHUNTS MV E/A ratio:  1.52        Systemic VTI:  0.19 m                            Systemic Diam: 1.80 cm Lennie Odor MD Electronically signed by Lennie Odor MD Signature Date/Time: 12/21/2020/3:57:08 PM    Final    [place] EKG SITE RITE  Result Date: 12/26/2020 If Site Rite image not attached, placement could not be confirmed due to current cardiac rhythm.  CT ORBITS W CONTRAST  Result Date: 12/21/2020 CLINICAL DATA:  Left proptosis.  Bacteremia. EXAM: CT ORBITS WITH CONTRAST TECHNIQUE: Multidetector CT images was performed according to the standard protocol following intravenous contrast administration. CONTRAST:  53mL OMNIPAQUE IOHEXOL 300 MG/ML  SOLN COMPARISON:  Head CT 05/21/2006 FINDINGS: Orbits: Mild-to-moderate left orbital preseptal soft tissue swelling. No fluid collection or postseptal inflammation. No fracture or destructive osseous process. Visualized sinuses: Paranasal sinuses and mastoid air cells are clear. Soft tissues: No additional findings. Limited intracranial: Unremarkable. IMPRESSION: Left orbital preseptal soft tissue swelling compatible with cellulitis. No evidence of postseptal cellulitis or abscess. Electronically Signed   By: Sebastian Ache M.D.   On: 12/21/2020 15:22    Microbiology: No results found for this or any previous visit (from the past 240 hour(s)).   Labs: Basic Metabolic Panel: Recent Labs  Lab 12/29/20 0500 01/01/21 0427  NA 135 136  K 4.0 3.9  CL 101 103  CO2 26 25  GLUCOSE 89 103*  BUN 7 11  CREATININE 0.72 0.90  CALCIUM 8.9 8.9   Liver Function Tests: No results for input(s): AST, ALT, ALKPHOS, BILITOT, PROT, ALBUMIN in the last 168 hours. No results for input(s): LIPASE, AMYLASE in the last 168 hours. No results for input(s): AMMONIA in the last 168 hours. CBC: Recent Labs  Lab 12/28/20 0538 12/29/20 0500  12/30/20 0500 12/31/20 0534 01/01/21 0427  WBC 6.9 7.5 7.2 6.4 6.4  NEUTROABS 3.3 3.4 3.6 2.6 2.8  HGB 10.9* 11.1* 11.0* 11.2* 11.1*  HCT 33.9* 34.3* 34.4* 34.7* 32.8*  MCV 95.5 95.8 95.6 96.4 94.0  PLT 376 381 342 320 335   Cardiac Enzymes: No results for input(s): CKTOTAL, CKMB, CKMBINDEX, TROPONINI in the last 168 hours. BNP: BNP (last 3 results) No results for input(s): BNP in the last 8760 hours.  ProBNP (last 3 results) No results for input(s): PROBNP in the last 8760 hours.  CBG: No results for input(s): GLUCAP in the last 168 hours.     Signed:  Junious Silk ANP [facility] 01/03/2021, 1:34 PM

## 2021-01-03 NOTE — Plan of Care (Signed)
  Problem: Health Behavior/Discharge Planning: Goal: Ability to manage health-related needs will improve Outcome: Progressing   Problem: Clinical Measurements: Goal: Will remain free from infection Outcome: Progressing Goal: Diagnostic test results will improve Outcome: Progressing   

## 2021-01-04 DIAGNOSIS — A4102 Sepsis due to Methicillin resistant Staphylococcus aureus: Secondary | ICD-10-CM | POA: Diagnosis not present

## 2021-01-04 LAB — BASIC METABOLIC PANEL
Anion gap: 9 (ref 5–15)
BUN: 12 mg/dL (ref 6–20)
CO2: 24 mmol/L (ref 22–32)
Calcium: 9.3 mg/dL (ref 8.9–10.3)
Chloride: 103 mmol/L (ref 98–111)
Creatinine, Ser: 0.82 mg/dL (ref 0.44–1.00)
GFR, Estimated: >60 mL/min (ref >60)
Glucose, Bld: 101 mg/dL — ABNORMAL HIGH (ref 70–99)
Potassium: 3.9 mmol/L (ref 3.5–5.1)
Sodium: 136 mmol/L (ref 135–145)

## 2021-01-04 NOTE — Discharge Instructions (Signed)
Hepatitis C  Hepatitis C is a liver infection that is caused by a virus. Many people have no symptoms or only mild symptoms. Hepatitis C is contagious. This means that it can spread from person to person. Over time, the infection can lead to serious liver problems. What are the causes? This condition is caused by a virus. People can get this infection through:  Contact with certain body fluids of someone who has the virus. This includes: ? Blood. ? Semen or vaginal fluids.  Childbirth. A woman can pass the virus to her baby during birth.  Blood or organ donations done in the [place] before 1992. What increases the risk? The following things may make you more likely to get this condition:  Having contact with needles or syringes that have the virus on them. This may happen while: ? Getting a treatment that involves putting thin needles through your skin (acupuncture). ? Getting a tattoo. ? Getting a body piercing. ? Injecting drugs.  Having sex with someone who has the virus. The virus can be passed through vaginal, oral, or anal sex.  Getting treatment to filter your blood (kidney dialysis).  Having HIV or AIDS.  Having a job that involves contact with blood or certain other body fluids, such as in health care. What are the signs or symptoms? Symptoms of this condition include:  Tiredness (fatigue).  Not wanting to eat as much as normal (loss of appetite).  Feeling like you may vomit (nauseous).  Vomiting.  Pain in your belly (abdomen).  Dark yellow pee (urine).  Your skin or the white parts of your eyes turning yellow (jaundice).  Itchy skin.  Light-colored or tan poop (stool).  Joint pain.  Bleeding and bruising that happen often.  Fluid building up in your stomach (ascites). Often, there are no symptoms. How is this treated? Treatment may depend on how bad your condition is, how long it has lasted, and whether you have liver damage. More testing may  be done to figure out the best treatment. Treatment may include:  Taking antiviral medicines and other medicines.  Having follow-up treatments every 6-12 months for infections or other liver problems.  Getting a new liver from a donor (liver transplant). Follow these instructions at home: Medicines  Take over-the-counter and prescription medicines only as told by your doctor.  If you were prescribed an antiviral medicine, take it as told by your doctor. Do not stop using the antiviral even if you start to feel better.  Do not take any new medicines unless your doctor says that this is okay. This includes over-the-counter medicines and supplements. Activity  Rest as needed.  Do not have sex until your doctor says this is okay.  Return to your normal activities as told by your doctor. Ask your doctor what activities are safe for you.  Ask your doctor when you may go back to school or work. Eating and drinking   Eat a balanced diet. Eat plenty of: ? Fruits and vegetables. ? Whole grains. ? Low-fat (lean) meats or non-meat proteins, such as beans or tofu.  Drink enough fluids to keep your pee pale yellow.  Do not drink alcohol. General instructions  Do not share toothbrushes, nail clippers, or razors.  Wash your hands often with soap and water for at least 20 seconds. If you do not have soap and water, use hand sanitizer.  Cover any cuts or open sores on your skin.  Avoid swimming or using hot tubs if you  have open sores or wounds.  Keep all follow-up visits as told by your doctor. This is important. You may need follow-up visits every 6-12 months. How is this prevented?  Wash your hands often with soap and water for at least 20 seconds.  Do not share needles or syringes.  Use a condom every time you have vaginal, oral, or anal sex. Be sure to use it the right way each time. ? Both females and males should wear condoms. ? Condoms should be kept in place from the  start to the end of sexual activity. ? Latex condoms should be used, if possible.  Do not handle blood or body fluids without gloves or other protection.  Avoid getting tattoos or piercings in places that are not clean. Where to find more information  Centers for Disease Control and Prevention: [employer] Contact a doctor if:  You have a fever.  You have belly pain.  Your pee is dark.  Your poop is a light color or tan.  You have joint pain. Get help right away if:  You feel more and more tired.  You feel more and more weak.  You do not feel like eating.  You cannot eat or drink without vomiting.  Your skin or the whites of your eyes turn yellow or turn more yellow than they were before.  You bruise or bleed easily. Summary  Hepatitis C is a liver infection that is caused by a virus.  Over time, the infection can lead to serious liver problems.  The virus can be spread from person to person through blood, semen, or vaginal fluids.  Do not take any new medicines unless your doctor says that this is okay. This information is not intended to replace advice given to you by your health care provider. Make sure you discuss any questions you have with your health care provider. Document Revised: 09/06/2019 Document Reviewed: 08/14/2019 Elsevier Patient Education  2020 [employer]  Bacteremia, Adult Bacteremia is the presence of bacteria in the blood. When bacteria enter the bloodstream, they can cause a life-threatening reaction called sepsis. Sepsis is a medical emergency. What are the causes? This condition is caused by bacteria that get into the blood. Bacteria can enter the blood from an infection, including:  A skin infection or injury, such as a burn or a cut.  A lung infection (pneumonia).  An infection in the stomach or intestines.  An infection in the bladder or urinary system (urinary tract infection).  A bacterial infection in another part of the  body that spreads to the blood. Bacteria can also enter the blood during a dental or medical procedure, from bleeding gums, or through use of an unclean needle. What increases the risk? This condition is more likely to develop in children, older adults, and people who have:  A long-term (chronic) disease or condition like diabetes or chronic kidney failure.  An artificial joint or heart valve, or heart valve disease.  A tube inserted to treat a medical condition, such as a urinary catheter or IV.  A weak disease-fighting system (immune system).  Injected illegal drugs.  Been hospitalized for more than 10 days in a row. What are the signs or symptoms? Symptoms of this condition include:  Fever and chills.  Fast heartbeat and shortness of breath.  Dizziness, weakness, and low blood pressure.  Confusion or anxiety.  Pain in the abdomen, nausea, vomiting, and diarrhea. Bacteremia that has spread to other parts of the body may cause  symptoms in those areas. In some cases, there are no symptoms. How is this diagnosed? This condition may be diagnosed with a physical exam and tests, such as:  Blood tests to check for bacteria (cultures) or other signs of infection.  Tests of any tubes that you have had inserted. These tests check for a source of infection.  Urine tests to check for bacteria in the urine.  Imaging tests, such as an X-ray, a CT scan, an MRI, or a heart ultrasound. These check for a source of infection in other parts of your body, such as your lungs, heart valves, or joints. How is this treated? This condition is usually treated in the hospital. If you are treated at home, you may need to return to the hospital for medicines, blood tests, and evaluation. Treatment may include:  Antibiotic medicines. These may be given by mouth or directly into your blood through an IV. You may need antibiotics for several weeks. At first, you may be given an antibiotic to kill most  types of blood bacteria. If tests show that a certain kind of bacteria is causing the problem, you may be given a different antibiotic.  IV fluids.  Removing any catheter or device that could be a source of infection.  Blood pressure and breathing support, if needed.  Surgery to control the source or the spread of infection, such as surgery to remove an implanted device, abscess, or infected tissue. Follow these instructions at home: Medicines  Take over-the-counter and prescription medicines only as told by your health care provider.  If you were prescribed an antibiotic medicine, take it as told by your health care provider. Do not stop taking the antibiotic even if you start to feel better. General instructions   Rest as needed. Ask your health care provider when you may return to normal activities.  Drink enough fluid to keep your urine pale yellow.  Do not use any products that contain nicotine or tobacco, such as cigarettes, e-cigarettes, and chewing tobacco. If you need help quitting, ask your health care provider.  Keep all follow-up visits as told by your health care provider. This is important. How is this prevented?   Wash your hands regularly with soap and water. If soap and water are not available, use hand sanitizer.  You should wash your hands: ? After using the toilet or changing a diaper. ? Before preparing, cooking, serving, or eating food. ? While caring for a sick person or while visiting someone in a hospital. ? Before and after changing bandages (dressings) over wounds.  Clean any scrapes or cuts with soap and water and cover them with a clean bandage.  Get vaccinations as recommended by your health care provider.  Practice good oral hygiene. Brush your teeth two times a day, and floss regularly.  Take good care of your skin. This includes bathing and moisturizing on a regular basis. Contact a health care provider if:  Your symptoms get worse, and  medicines do not help.  You have severe pain. Get help right away if you have:  Pain.  A fever or chills.  Trouble breathing.  A fast heart rate.  Skin that is blotchy, pale, or clammy.  Confusion.  Weakness.  Lack of energy or unusual sleepiness.  New symptoms that develop after treatment has started. These symptoms may represent a serious problem that is an emergency. Do not wait to see if the symptoms will go away. Get medical help right away. Call your local  emergency services (911 in the [place] Do not drive yourself to the hospital. Summary  Bacteremia is the presence of bacteria in the blood. When bacteria enter the bloodstream, they can cause a life-threatening reaction called sepsis.  Bacteremia is usually treated with antibiotic medicines in the hospital.  If you were prescribed an antibiotic medicine, take it as told by your health care provider. Do not stop taking the antibiotic even if you start to feel better.  Get help right away if you have any new symptoms that develop after treatment has started. This information is not intended to replace advice given to you by your health care provider. Make sure you discuss any questions you have with your health care provider. Document Revised: 05/05/2019 Document Reviewed: 05/05/2019 Elsevier Patient Education  2020 [employer].

## 2021-01-04 NOTE — Progress Notes (Signed)
D/C instructions given to patient. Wound care and medications reviewed. PICC line removed by IV team. Meds delivered via [facility] pharmacy. Family to escort pt home.  Versie Starks, RN

## 2021-01-04 NOTE — Discharge Summary (Signed)
Discharge Summary  [name] FYB:[id number] DOB: 1991-11-03  PCP: Pcp, No  Admit date: 12/19/2020 Discharge date: 01/04/2021  Time spent: 35 minutes  Recommendations for Outpatient Follow-up:  1. Patient will need to follow-up with infectious disease team 2 weeks after completion of antibiotics for repeat blood cultures and routine hospital follow-up.  ID physician also recommended she follow-up with the ID clinic regarding abnormal hepatitis C testing.  ID physician ordered hep B and hep A vaccines during this hospitalization and these were administered on 12/31 2. Patient has previously been treated for ADD at [facility] and is recommended she follow-up to determine if it is appropriate for her to resume prior Adderall 3. Patient has been encouraged to continue to increase physical activity noting that at times with activity she has inappropriate tachycardia she is suspected related to deconditioning.  She does not have any resting tachycardia.   Discharge Diagnoses:  Active Hospital Problems   Diagnosis Date Noted  . Sepsis (HCC) 10/26/2019  . Inappropriate sinus tachycardia   . Bacteremia   . Rash 12/21/2020  . Endocarditis 12/20/2020  . Polysubstance dependence including opioid type drug without complication, episodic abuse (HCC) 12/16/2017  . Cigarette smoker 08/18/2016  . Hepatitis C, chronic, maternal, antepartum (HCC) 08/04/2016    Resolved Hospital Problems  No resolved problems to display.    Discharge Condition: Stable  Diet recommendation: Resume previous diet.  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 She has had similar symptoms like this previously in the past. Current episode started about 4 days ago. Initially rash started out as little pimples that sometimes pop on their own or develop into abscesses. Denied any significant fever, chills, chest pain, cough, shortness of breath, leg swelling, abdominal pain, or blood in stool. Last admitted into the hospital in May of this year for septic shock with tricuspid valve endocarditis related to strep pyrogens bacteremia. she only stayed in hospital couple days prior to leaving AGAINST MEDICAL ADVICE and never followed up with any provider. She claims to be clean from IV drugs for 120 days after getting out of jail on August 19.  Upon admission into the emergency department patient was hemodynamically stable other than mild tachycardia. Urine drug screen was positive for amphetamineshowever she claims that she takes Adderall which is prescribed. Chest x-ray showed no acute abnormalities. ID consulted and recommended checking 3 sets of blood cultures starting cefazolin. Blood culture growing MRSA and staph epidermidis. Repeat blood culture from 12/22/2020 - thus far.TEE negative for any vegetations.  01/04/21: Patient was seen and examined at her bedside.  No acute events overnight.  She has no new complaints.  She is eager to go home.  States she will be living with her dad.  She has no intention of going back to the use of illicit drugs.  Advised to keep her appointment and to completely abstain from illicit drug use.  She understands and agrees with plan.   Hospital Course:  Principal Problem:   Sepsis (HCC) Active Problems:   Hepatitis C, chronic, maternal, antepartum (HCC)   Cigarette smoker  Polysubstance dependence including opioid type drug without complication, episodic abuse (HCC)   Endocarditis   Rash   Bacteremia   Inappropriate sinus tachycardia  Sepsis secondary to  MRSA bacteremia without endocarditis Previously positive for group A strep (strep pyogenes) when admitted back in May with tricuspid valve endocarditis, but left AMA prior to receiving full recommended treatment.  This admission CRP was elevated at 6.3 and sedimentation rate was 40. ID recommended a total of 14 days of IV antibiotics-completed vancomycin on 1/6. Patient will be given a dose of oritavancin and then in 1 week she will begin linezolid 600 mg p.o. BIDfor the next 7 days Her repeat transthoracic echo does not show any endocarditis, Blood culture growing MRSA and staph epidermidis. Repeat blood culture from 12/22/2020 thus far.  Status post TEE which does not show any endocarditis.  PICC placed this admission and recommended to be removed at time of discharge  Inappropriate tachycardia Orthostatic vital signs were normal Exertional tachycardia -suspect deconditioning Patient has spent the majority of her time while here sleeping and laying in the bed with limited physical activity. Patient has been encouraged to get up and move to help improve heart rate.  Polysubstance abuse  Patient reportedly was clean from IV drug use for the last 120 days.  Urine drug screen was positive for amphetamines however pt reports taking Adderall.  On 1/4 discussed with patient. She states she had previously been prescribed Adderall by [facility] but had not taken in several months. This seems to conflict with history obtained from patient and admission who reports that her UDS was positive for amphetamines secondary to use of prescribed Adderall. Patient encouraged to follow-up with [facility] after discharge to resume Adderall since this may assist in maintaining sobriety.  Mild left preseptal cellulitis ID was concerned about possible osteomyelitis Patient has no visual symptoms or deep pain in the left eye. Per ID recommendation, ophthalmology was consulted and pt is currently treated for  preseptal cellulitis on current abx. Patient continues to improve every day. No problem with vision.  ? Hepatitis C Per ID:"Her Hep C RNA was (-) on 12-26. I am doubtful that this is accurate as she prev had VL in the 100s of thousands. She denies treatment.  I have re-ordered this.  She can f/u in ID clinic for her bacteremia and potential Hep C treatment.  Will order Hep A and B vaccines. elastogram can not be done inpt. Her prev u/s from 04-2020 was (-) for strutural abn" LFTs stable   Other problems: Hyponatremia -Normalized  Mild AKI Secondary to sepsis.  Resolved.  Cont to follow bmet trends  Unknown skin lesions, possibly carbuncles Patient presents with complaints of multiple skin ulcerations mostly concentrated on the face and neck. Likely secondary to Staphylococcus.Worked up by ID, RPR and treponema neg  Asymptomatic bacteriuria Require antibiotic treatment during hospitalization  Procedures:  TEE  Consultations:  ID  Ophthalmology   Discharge Exam: BP 117/80 (BP Location: Left Arm)   Pulse 85   Temp 98.5 F (36.9 C) (Oral)   Resp 16   Ht 5' (1.524 m)   Wt 56.2 kg   LMP 11/27/2020   SpO2 93%   BMI 24.22 kg/m  . General: 30 y.o. year-old female well developed well nourished in no acute distress.  Alert and oriented x3. . Cardiovascular: Regular rate and rhythm with no rubs or gallops.  No thyromegaly or JVD noted.   Marland Kitchen Respiratory: Clear to auscultation with no wheezes or rales.  Good inspiratory effort. . Abdomen: Soft nontender nondistended with normal bowel sounds x4 quadrants. . Musculoskeletal: No lower extremity edema. 2/4 pulses in all 4 extremities. . Skin: No ulcerative lesions noted or rashes, . Psychiatry: Mood is appropriate for condition and setting  Discharge Instructions You were cared for by a hospitalist during your hospital stay. If you have any questions about your discharge medications or the care you received while you  were in the hospital after you are discharged, you can call the unit and asked to speak with the hospitalist on call if the hospitalist that took care of you is not available. Once you are discharged, your primary care physician will handle any further medical issues. Please note that NO REFILLS for any discharge medications will be authorized once you are discharged, as it is imperative that you return to your primary care physician (or establish a relationship with a primary care physician if you do not have one) for your aftercare needs so that they can reassess your need for medications and monitor your lab values.   Allergies as of 01/04/2021      Reactions   Dextromethorphan Swelling   Facial swelling      Medication List    TAKE these medications   acetaminophen 325 MG tablet Commonly known as: TYLENOL Take 2 tablets (650 mg total) by mouth every 6 (six) hours as needed for mild pain (or Fever >/= 101).   ibuprofen 400 MG tablet Commonly known as: ADVIL Take 1 tablet (400 mg total) by mouth every 6 (six) hours as needed for moderate pain. What changed:   medication strength  how much to take  reasons to take this   linezolid 600 MG tablet Commonly known as: ZYVOX Take 1 tablet (600 mg total) by mouth 2 (two) times daily for 7 days. Start taking on: January 11, 2021   neomycin-bacitracin-polymyxin ointment Commonly known as: NEOSPORIN Apply 1 application topically 3 (three) times daily as needed for wound care. Apply to face   nicotine 14 mg/24hr patch Commonly known as: NICODERM CQ - dosed in mg/24 hours Place 1 patch (14 mg total) onto the skin daily.   ondansetron 4 MG tablet Commonly known as: ZOFRAN Take 1 tablet (4 mg total) by mouth every 6 (six) hours as needed for nausea.      Allergies  Allergen Reactions  . Dextromethorphan Swelling    Facial swelling    Follow-up Information    Ronnald NianLalonde, John C, MD. Schedule an appointment as soon as possible for a  visit.   Specialty: Family Medicine Why: Please call and make an appointment with your primary care physician in the next 7-10 days for a hospital follow up after your discharge home from the hospital. Contact information: [address] [place] [address] [phone]        Raymondo BandVu, Trung T, MD. Schedule an appointment as soon as possible for a visit in 3 week(s).   Specialty: Infectious Diseases Why: Call to make follow-up appointment within 3 weeks after discharge Contact information: [address] [place] [address] [phone]        [facility] Follow up.   Why: [facility] follow up. Contact information: [address]  [address] [place] [address] [phone]                The results of significant diagnostics from this hospitalization (including imaging, microbiology, ancillary and laboratory) are listed  below for reference.    Significant Diagnostic Studies: DG Chest 2 View  Result Date: 12/19/2020 CLINICAL DATA:  30 year old female with tachycardia. EXAM: CHEST - 2 VIEW COMPARISON:  Chest radiograph dated 05/12/2020. FINDINGS: The heart size and mediastinal contours are within normal limits. Both lungs are clear. The visualized skeletal structures are unremarkable. IMPRESSION: No active cardiopulmonary disease. Electronically Signed   By: Elgie Collard M.D.   On: 12/19/2020 22:12   ECHOCARDIOGRAM COMPLETE  Result Date: 12/21/2020    ECHOCARDIOGRAM REPORT   Patient Name:   SMERA GUYETTE [facility] Date of Exam: 12/21/2020 Medical Rec #:  [id number]       Height:       60.0 in Accession #:    [id number]      Weight:       124.0 lb Date of Birth:  [birth year]       BSA:          1.523 m Patient Age:    29 years        BP:           117/79 mmHg Patient Gender: F               HR:           92 bpm. Exam Location:  Inpatient Procedure: 2D Echo Indications:    endocarditis  History:         Patient has prior history of Echocardiogram examinations, most                 recent 05/12/2020. Sepsis. Hepatitis C.; Risk Factors:Current                 Smoker and poly substance abuse.  Sonographer:    Delcie Roch Referring Phys: [id number] RONDELL A SMITH IMPRESSIONS  1. Left ventricular ejection fraction, by estimation, is 60 to 65%. The left ventricle has normal function. The left ventricle has no regional wall motion abnormalities. Left ventricular diastolic parameters were normal.  2. Right ventricular systolic function is normal. The right ventricular size is normal. There is normal pulmonary artery systolic pressure. The estimated right ventricular systolic pressure is 33.0 mmHg.  3. The mitral valve is grossly normal. Trivial mitral valve regurgitation. No evidence of mitral stenosis.  4. The aortic valve is tricuspid. Aortic valve regurgitation is not visualized. No aortic stenosis is present.  5. The inferior vena cava is normal in size with greater than 50% respiratory variability, suggesting right atrial pressure of 3 mmHg. Conclusion(s)/Recommendation(s): Normal biventricular function without evidence of hemodynamically significant valvular heart disease. No evidence of valvular vegetations on this transthoracic echocardiogram. Would recommend a transesophageal echocardiogram to exclude infective endocarditis if clinically indicated. FINDINGS  Left Ventricle: Left ventricular ejection fraction, by estimation, is 60 to 65%. The left ventricle has normal function. The left ventricle has no regional wall motion abnormalities. The left ventricular internal cavity size was normal in size. There is  no left ventricular hypertrophy. Left ventricular diastolic parameters were normal. Right Ventricle: The right ventricular size is normal. No increase in right ventricular wall thickness. Right ventricular systolic function is normal. There is normal pulmonary artery systolic pressure. The tricuspid  regurgitant velocity is 2.74 m/s, and  with an assumed right atrial pressure of 3 mmHg, the estimated right ventricular systolic pressure is 33.0 mmHg. Left Atrium: Left atrial size was normal in size. Right Atrium: Right atrial size was normal in size. Pericardium: Trivial pericardial effusion is present. Mitral Valve: The mitral valve  is grossly normal. Trivial mitral valve regurgitation. No evidence of mitral valve stenosis. Tricuspid Valve: The tricuspid valve is grossly normal. Tricuspid valve regurgitation is mild . No evidence of tricuspid stenosis. Aortic Valve: The aortic valve is tricuspid. Aortic valve regurgitation is not visualized. No aortic stenosis is present. Pulmonic Valve: The pulmonic valve was grossly normal. Pulmonic valve regurgitation is not visualized. No evidence of pulmonic stenosis. Aorta: The aortic root and ascending aorta are structurally normal, with no evidence of dilitation. Venous: The inferior vena cava is normal in size with greater than 50% respiratory variability, suggesting right atrial pressure of 3 mmHg. IAS/Shunts: The atrial septum is grossly normal.  LEFT VENTRICLE PLAX 2D LVIDd:         3.90 cm  Diastology LVIDs:         2.40 cm  LV e' medial:    10.10 cm/s LV PW:         0.90 cm  LV E/e' medial:  9.3 LV IVS:        0.80 cm  LV e' lateral:   18.40 cm/s LVOT diam:     1.80 cm  LV E/e' lateral: 5.1 LV SV:         49 LV SV Index:   32 LVOT Area:     2.54 cm  RIGHT VENTRICLE RV S prime:     17.70 cm/s TAPSE (M-mode): 2.0 cm LEFT ATRIUM             Index       RIGHT ATRIUM           Index LA diam:        3.70 cm 2.43 cm/m  RA Area:     10.20 cm LA Vol (A2C):   27.6 ml 18.15 ml/m RA Volume:   21.20 ml  13.92 ml/m LA Vol (A4C):   29.3 ml 19.23 ml/m LA Biplane Vol: 29.8 ml 19.56 ml/m  AORTIC VALVE LVOT Vmax:   110.00 cm/s LVOT Vmean:  73.500 cm/s LVOT VTI:    0.193 m  AORTA Ao Root diam: 2.40 cm Ao Asc diam:  2.30 cm MITRAL VALVE               TRICUSPID VALVE MV Area  (PHT): 3.85 cm    TR Peak grad:   30.0 mmHg MV Decel Time: 197 msec    TR Vmax:        274.00 cm/s MV E velocity: 94.10 cm/s MV A velocity: 61.80 cm/s  SHUNTS MV E/A ratio:  1.52        Systemic VTI:  0.19 m                            Systemic Diam: 1.80 cm Lennie OdorWesley O'Neal MD Electronically signed by Lennie OdorWesley O'Neal MD Signature Date/Time: 12/21/2020/3:57:08 PM    Final    US EKG SITE RITE  Result Date: 12/26/2020 If Site Rite image not attached, placement could not be confirmed due to current cardiac rhythm.  CT ORBITS W CONTRAST  Result Date: 12/21/2020 CLINICAL DATA:  Left proptosis.  Bacteremia. EXAM: CT ORBITS WITH CONTRAST TECHNIQUE: Multidetector CT images was performed according to the standard protocol following intravenous contrast administration. CONTRAST:  75mL OMNIPAQUE IOHEXOL 300 MG/ML  SOLN COMPARISON:  Head CT 05/21/2006 FINDINGS: Orbits: Mild-to-moderate left orbital preseptal soft tissue swelling. No fluid collection or postseptal inflammation. No fracture or destructive osseous process. Visualized sinuses: Paranasal sinuses and mastoid  air cells are clear. Soft tissues: No additional findings. Limited intracranial: Unremarkable. IMPRESSION: Left orbital preseptal soft tissue swelling compatible with cellulitis. No evidence of postseptal cellulitis or abscess. Electronically Signed   By: Sebastian Ache M.D.   On: 12/21/2020 15:22    Microbiology: No results found for this or any previous visit (from the past 240 hour(s)).   Labs: Basic Metabolic Panel: Recent Labs  Lab 12/29/20 0500 01/01/21 0427 01/04/21 0425  NA 135 136 136  K 4.0 3.9 3.9  CL 101 103 103  CO2 26 25 24   GLUCOSE 89 103* 101*  BUN 7 11 12   CREATININE 0.72 0.90 0.82  CALCIUM 8.9 8.9 9.3   Liver Function Tests: No results for input(s): AST, ALT, ALKPHOS, BILITOT, PROT, ALBUMIN in the last 168 hours. No results for input(s): LIPASE, AMYLASE in the last 168 hours. No results for input(s): AMMONIA in the  last 168 hours. CBC: Recent Labs  Lab 12/29/20 0500 12/30/20 0500 12/31/20 0534 01/01/21 0427  WBC 7.5 7.2 6.4 6.4  NEUTROABS 3.4 3.6 2.6 2.8  HGB 11.1* 11.0* 11.2* 11.1*  HCT 34.3* 34.4* 34.7* 32.8*  MCV 95.8 95.6 96.4 94.0  PLT 381 342 320 335   Cardiac Enzymes: No results for input(s): CKTOTAL, CKMB, CKMBINDEX, TROPONINI in the last 168 hours. BNP: BNP (last 3 results) No results for input(s): BNP in the last 8760 hours.  ProBNP (last 3 results) No results for input(s): PROBNP in the last 8760 hours.  CBG: No results for input(s): GLUCAP in the last 168 hours.     Signed:  02/28/21, MD [facility] 01/04/2021, 12:12 PM

## 2021-01-14 ENCOUNTER — Telehealth: Payer: Self-pay

## 2021-01-14 ENCOUNTER — Inpatient Hospital Stay: Payer: Medicaid Other | Admitting: Infectious Diseases

## 2021-01-14 NOTE — Telephone Encounter (Signed)
Attempted to reach patient due to missed appointment. Patient was discharge from hospital on 01/03/21 and being treated for sepsis secondary to MRSA bacteremia and was given 14 tabs of linezolid 600mg  tablets per hospital discharge pharmacy.   Attempted to get patient reacheduled for follow up visit. Patient's voicemail box is full and can not accept messages at this time. Will send to MD as FYI. 

## 2021-03-06 ENCOUNTER — Other Ambulatory Visit: Payer: Self-pay

## 2021-03-06 ENCOUNTER — Ambulatory Visit (HOSPITAL_COMMUNITY): Payer: Medicaid Other | Admitting: Professional

## 2021-03-06 ENCOUNTER — Telehealth (HOSPITAL_COMMUNITY): Payer: Self-pay | Admitting: Professional

## 2021-03-06 NOTE — Telephone Encounter (Signed)
See call log 

## 2021-05-30 IMAGING — CT CT ORBITS W/ CM
3 of 6 series · 13 of 47 positions shown, 15 images · IV contrast (APPLIED)
Comparison: Head CT 05/21/2006

CLINICAL DATA: Left proptosis.  Bacteremia.

EXAM:
CT ORBITS WITH CONTRAST
TECHNIQUE: Multidetector CT images was performed according to the standard
protocol following intravenous contrast administration.
CONTRAST:  75mL OMNIPAQUE IOHEXOL 300 MG/ML  SOLN

[Series 3: orbits 2.0 hr40 3 st · axial · 0.43mm/px · z∈[-117,-39]mm · 8 of 47 slices shown, 10 images]
[im 4/47  brain]
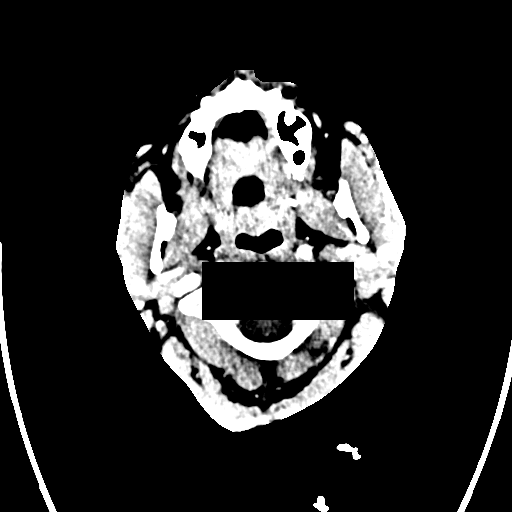
[im 4/47  bone]
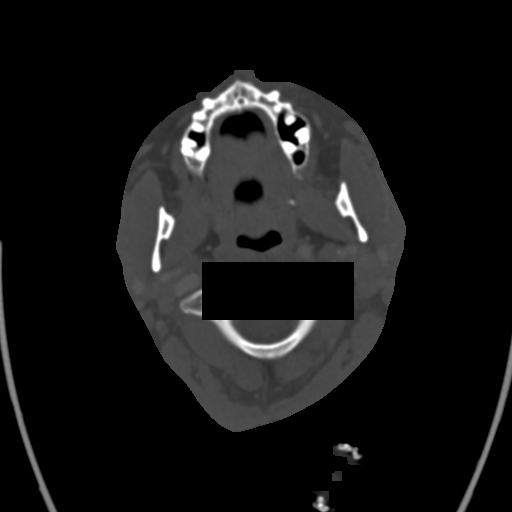
[im 10/47  bone]
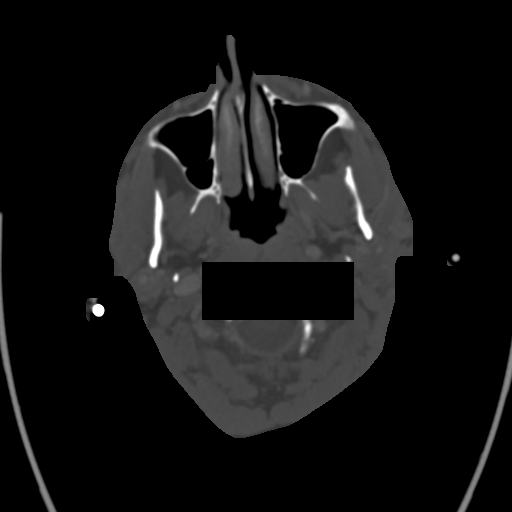
[im 17/47  bone]
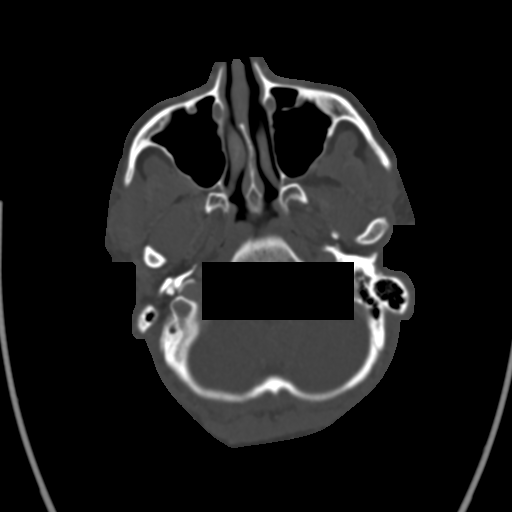
[im 20/47  bone]
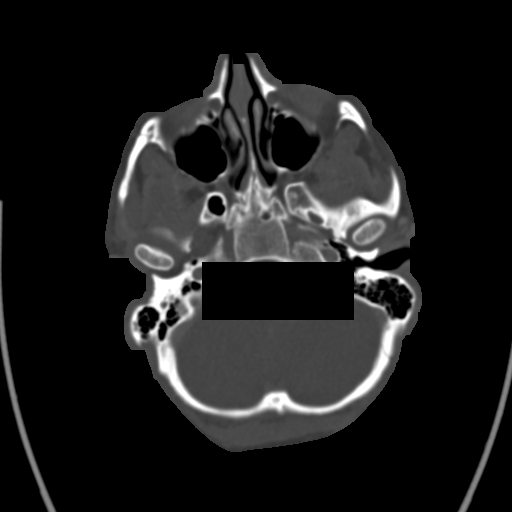
[im 27/47  brain]
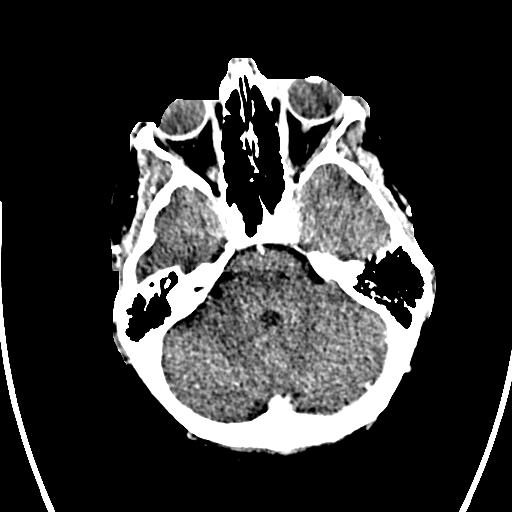
[im 27/47  bone]
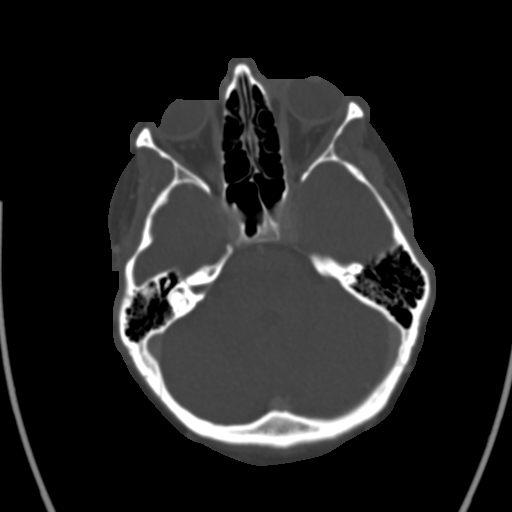
[im 30/47  bone]
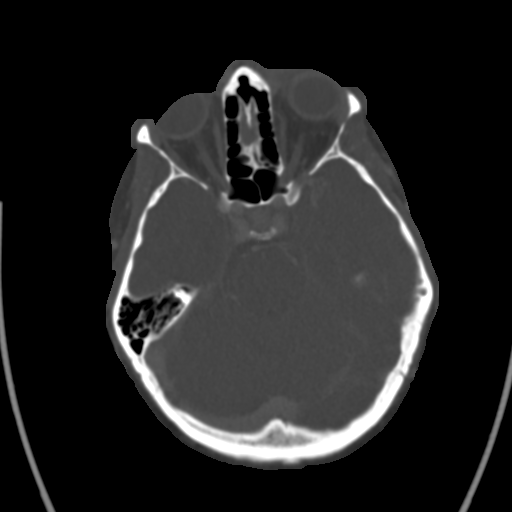
[im 37/47  bone]
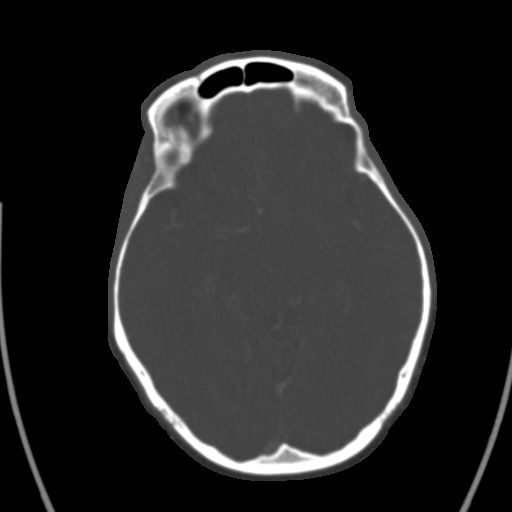
[im 43/47  bone]
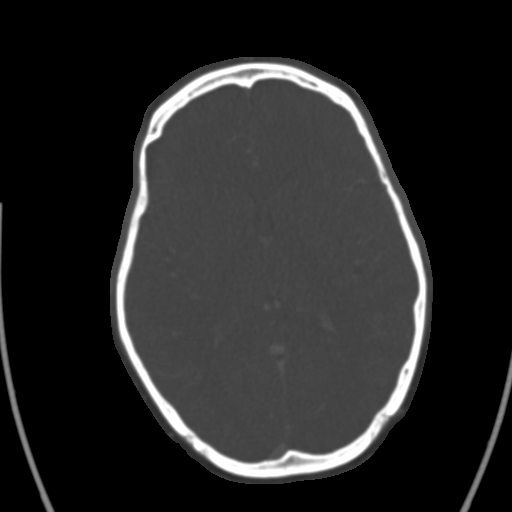

[Series 7: bone cor · coronal · 0.19mm/px · 3 of 109 slices shown]
[im 28/109  bone]
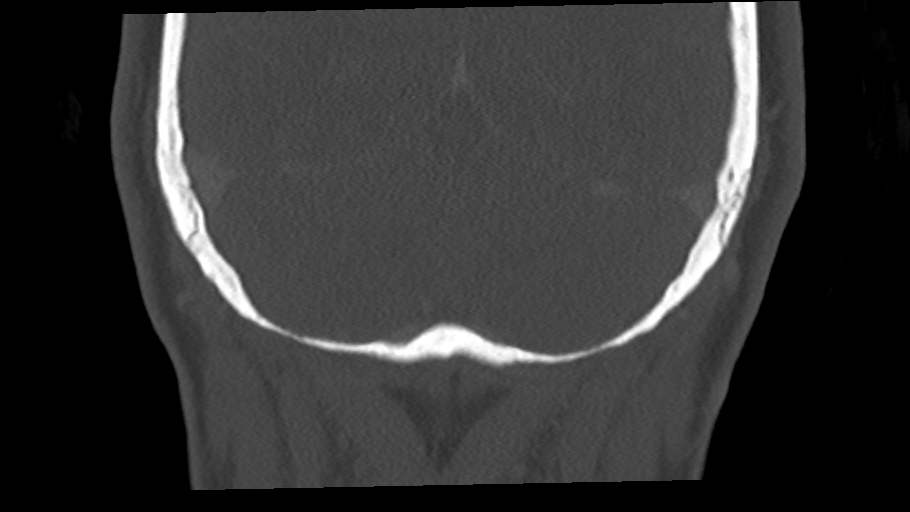
[im 55/109  bone]
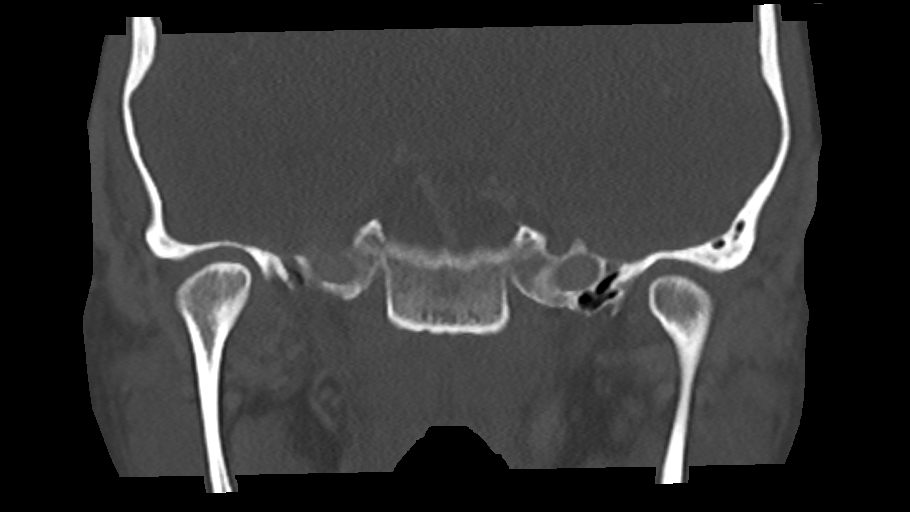
[im 82/109  bone]
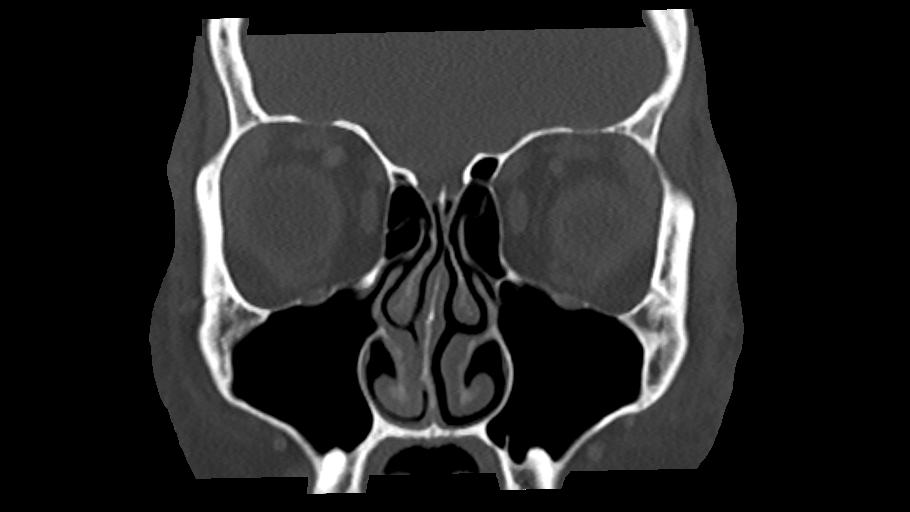

[Series 8: bone sag · sagittal · 0.19mm/px · 2 of 88 slices shown]
[im 30/88  bone]
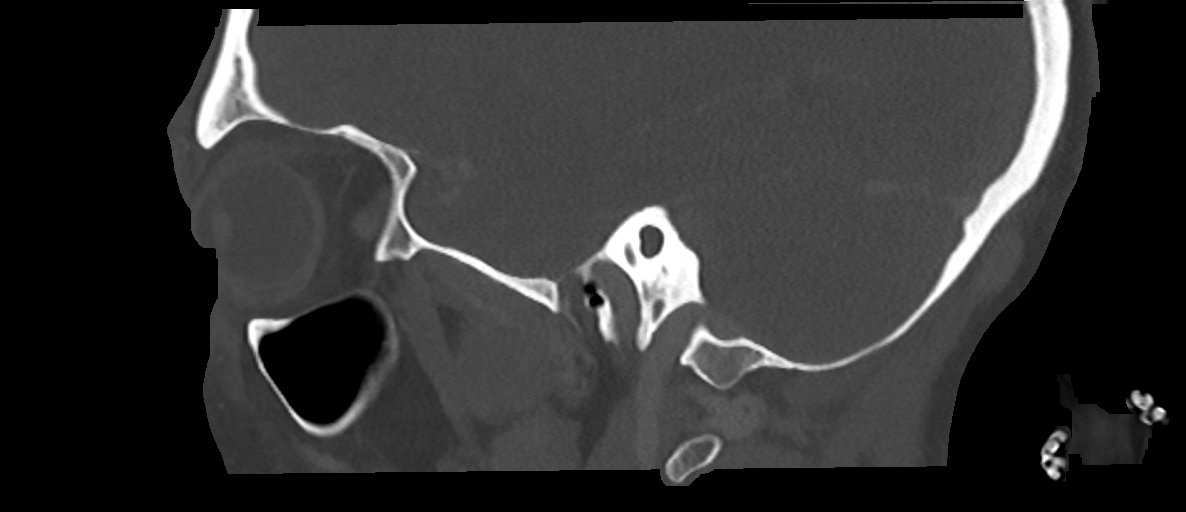
[im 59/88  bone]
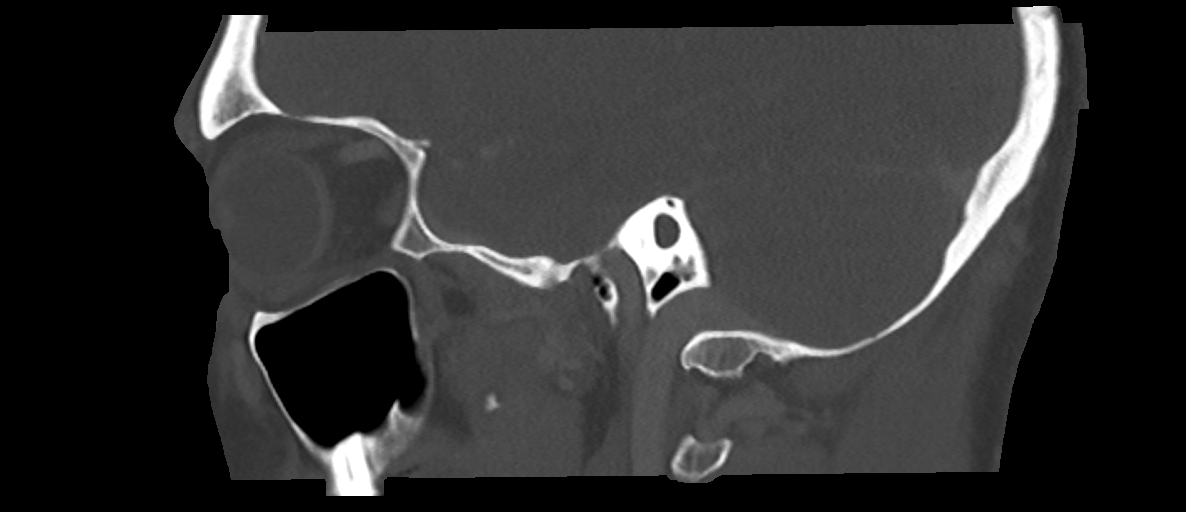

[13 of 47 positions shown; findings below may reference images not displayed]

FINDINGS: Orbits: Mild-to-moderate left orbital preseptal soft tissue
swelling. No fluid collection or postseptal inflammation. No
fracture or destructive osseous process.

Visualized sinuses: Paranasal sinuses and mastoid air cells are
clear.

Soft tissues: No additional findings.

Limited intracranial: Unremarkable.
IMPRESSION: Left orbital preseptal soft tissue swelling compatible with
cellulitis. No evidence of postseptal cellulitis or abscess.

## 2022-07-09 ENCOUNTER — Emergency Department (HOSPITAL_COMMUNITY): Admission: EM | Admit: 2022-07-09 | Discharge: 2022-07-09 | Discharge status: Against Medical Advice | Payer: Self-pay

## 2022-07-18 ENCOUNTER — Ambulatory Visit (HOSPITAL_COMMUNITY)
Admission: RE | Admit: 2022-07-18 | Discharge: 2022-07-18 | Disposition: A | Discharge status: Home / Self Care | Payer: Self-pay | Attending: Psychiatry | Admitting: Psychiatry

## 2022-07-18 DIAGNOSIS — Z1330 Encounter for screening examination for mental health and behavioral disorders, unspecified: Secondary | ICD-10-CM

## 2022-07-18 NOTE — H&P (Signed)
Behavioral Health Medical Screening Exam  [name] is a 31 y.o. female.  Patient is a 31 yo Caucasian female with a history of polysubstance abuse requesting "long term inpatient" hospitalization. Pt reports that she has been abusing Fentanyl and heroine for "10 years or longer", and wants long term rehab. She reports that she currently uses 0.5 grams of Fentanyl daily. She reports that her boyfriend is currently in inpatient long term rehab, and she is lonely, and angry that they are not doing it together. She reports a history of PTSD, MDD, GAD as well as polysubstance abuse, and states that she has not been on her psychotropic medications for 4-5 years now. She currently denies suicidal ideations, denies homicidal ideations, denies paranoia, denies auditory hallucinations, denies visual hallucinations. Questions related to first rank symptoms asked, and pt denies these as well. There is no evidence of delusional thoughts.    Pt presents with a euthymic mood, affect is congruent. Attention to personal hygiene and grooming is fair, eye contact is good, speech is clear & coherent. Thought contents are organized and logical. Pt reports a good support system in her mother, states that she can live at her mother's home, and also has a camper of her own in which she can live.  V/S are WNL. Pt does not currently meet criteria for admission to [facility], and has been given mental health resources.    Total Time spent with patient: 45 minutes  Psychiatric Specialty Exam:  Presentation  General Appearance: Appropriate for Environment; Fairly Groomed  Eye Contact:Good  Speech:Clear and Coherent  Speech Volume:Normal  Handedness:Right   Mood and Affect  Mood:Euthymic  Affect:Appropriate; Congruent   Thought Process  Thought Processes:Coherent  Descriptions of Associations:Intact  Orientation:Full (Time, Place and Person)  Thought Content:Logical  History of Schizophrenia/Schizoaffective  disorder:No data recorded Duration of Psychotic Symptoms:No data recorded Hallucinations:Hallucinations: None  Ideas of Reference:None  Suicidal Thoughts:Suicidal Thoughts: No  Homicidal Thoughts:Homicidal Thoughts: No   Sensorium  Memory:Immediate Good  Judgment:Good  Insight:Good   Executive Functions  Concentration:Good  Attention Span:Good  Recall:Good  Fund of Knowledge:Good  Language:Good   Psychomotor Activity  Psychomotor Activity:Psychomotor Activity: Normal   Assets  Assets:Communication Skills   Sleep  Sleep:Sleep: Good    Physical Exam: Physical Exam Constitutional:      Appearance: Normal appearance.  HENT:     Head: Normocephalic.     Nose: Nose normal. No congestion or rhinorrhea.  Eyes:     Pupils: Pupils are equal, round, and reactive to light.  Pulmonary:     Effort: Pulmonary effort is normal.  Musculoskeletal:     Cervical back: Normal range of motion.  Neurological:     Mental Status: She is alert and oriented to person, place, and time.     Sensory: No sensory deficit.  Psychiatric:        Behavior: Behavior normal.        Thought Content: Thought content normal.    Review of Systems  Constitutional: Negative.   HENT: Negative.    Eyes: Negative.   Respiratory: Negative.    Cardiovascular: Negative.   Gastrointestinal: Negative.   Genitourinary: Negative.   Musculoskeletal: Negative.   Skin: Negative.   Psychiatric/Behavioral:  Positive for depression (Currently denies SI/HI/AVH and verbally contracts for safety outside of this [facility]. Pt also denies access to firearms.) and substance abuse. Negative for hallucinations, memory loss and suicidal ideas. The patient is not nervous/anxious and does not have insomnia.  Blood pressure 116/78, pulse 78, temperature 98.3 F (36.8 C), temperature source Oral, resp. rate 20, SpO2 100 %. There is no height or weight on file to calculate BMI.  Musculoskeletal: Strength &  Muscle Tone: within normal limits Gait & Station: normal Patient leans: N/A  [place] Scale:  Flowsheet Row [facility] from 07/12/2018 in [facility]  C-SSRS RISK CATEGORY No Risk High Risk      Recommendations: Based on my evaluation the patient does not appear to have an emergency medical condition. Patient has been provided with mental health resources including long term inpatient rehab facilities, which is what she is looking for at this time. Counselor provided pt with resources. Pt has been educated to call [phone], or come back to [facility] or go to the nearest ER if she has suicidal thoughts or HI/AVH. She verbalized understanding and walked out of [facility].  Starleen Blue, NP 07/18/2022, 5:43 PM

## 2022-08-19 ENCOUNTER — Other Ambulatory Visit: Payer: Self-pay

## 2022-08-19 ENCOUNTER — Emergency Department (HOSPITAL_BASED_OUTPATIENT_CLINIC_OR_DEPARTMENT_OTHER)
Admission: EM | Admit: 2022-08-19 | Discharge: 2022-08-19 | Disposition: A | Discharge status: Home / Self Care | Payer: Self-pay | Attending: Emergency Medicine | Admitting: Emergency Medicine

## 2022-08-19 ENCOUNTER — Encounter (HOSPITAL_BASED_OUTPATIENT_CLINIC_OR_DEPARTMENT_OTHER): Payer: Self-pay

## 2022-08-19 ENCOUNTER — Emergency Department (HOSPITAL_BASED_OUTPATIENT_CLINIC_OR_DEPARTMENT_OTHER): Payer: Self-pay | Admitting: Radiology

## 2022-08-19 DIAGNOSIS — L03119 Cellulitis of unspecified part of limb: Secondary | ICD-10-CM

## 2022-08-19 DIAGNOSIS — L03113 Cellulitis of right upper limb: Secondary | ICD-10-CM | POA: Insufficient documentation

## 2022-08-19 LAB — BASIC METABOLIC PANEL
Anion gap: 10 (ref 5–15)
BUN: 10 mg/dL (ref 6–20)
CO2: 22 mmol/L (ref 22–32)
Calcium: 8.9 mg/dL (ref 8.9–10.3)
Chloride: 105 mmol/L (ref 98–111)
Creatinine, Ser: 0.85 mg/dL (ref 0.44–1.00)
GFR, Estimated: >60 mL/min (ref >60)
Glucose, Bld: 106 mg/dL — ABNORMAL HIGH (ref 70–99)
Potassium: 3.5 mmol/L (ref 3.5–5.1)
Sodium: 137 mmol/L (ref 135–145)

## 2022-08-19 LAB — CBC WITH DIFFERENTIAL/PLATELET
Abs Immature Granulocytes: 0.02 10*3/uL (ref 0.00–0.07)
Basophils Absolute: 0 10*3/uL (ref 0.0–0.1)
Basophils Relative: 0 %
Eosinophils Absolute: 0.2 10*3/uL (ref 0.0–0.5)
Eosinophils Relative: 2 %
HCT: 35.2 % — ABNORMAL LOW (ref 36.0–46.0)
Hemoglobin: 11.5 g/dL — ABNORMAL LOW (ref 12.0–15.0)
Immature Granulocytes: 0 %
Lymphocytes Relative: 28 %
Lymphs Abs: 2.3 10*3/uL (ref 0.7–4.0)
MCH: 29.1 pg (ref 26.0–34.0)
MCHC: 32.7 g/dL (ref 30.0–36.0)
MCV: 89.1 fL (ref 80.0–100.0)
Monocytes Absolute: 0.6 10*3/uL (ref 0.1–1.0)
Monocytes Relative: 8 %
Neutro Abs: 5.1 10*3/uL (ref 1.7–7.7)
Neutrophils Relative %: 62 %
Platelets: 320 10*3/uL (ref 150–400)
RBC: 3.95 MIL/uL (ref 3.87–5.11)
RDW: 13.9 % (ref 11.5–15.5)
WBC: 8.3 10*3/uL (ref 4.0–10.5)
nRBC: 0 % (ref 0.0–0.2)

## 2022-08-19 LAB — HCG, SERUM, QUALITATIVE: Preg, Serum: NEGATIVE

## 2022-08-19 MED ORDER — VANCOMYCIN HCL IN DEXTROSE 1-5 GM/200ML-% IV SOLN
1000.0000 mg | Freq: Once | INTRAVENOUS | Status: AC
  Administered 2022-08-19: 1000 mg via INTRAVENOUS
  Filled 2022-08-19: qty 200

## 2022-08-19 MED ORDER — DOXYCYCLINE HYCLATE 100 MG PO CAPS: 100.0000 mg | ORAL_CAPSULE | Freq: Two times a day (BID) | ORAL | 0 refills | Status: DC

## 2022-08-19 NOTE — ED Notes (Signed)
Patient verbalizes understanding of discharge instructions. Opportunity for questioning and answers were provided. Patient discharged from ED.  °

## 2022-08-19 NOTE — ED Triage Notes (Addendum)
Patient here POV from Home.  Endorses IV Drug use and Injected Fentanyl into Right Posterior hand but Substance infiltrated and since the Patient has had worsening Swelling to same that worsened especially Last PM.  No Known Fevers. Initial Infiltration occurred approximately 1-1.5 Weeks ago.   NAD Noted during Triage. A&Ox4. GCS 15. Ambulatory.

## 2022-08-19 NOTE — ED Provider Notes (Signed)
[facility] EMERGENCY DEPT Provider Note   CSN: [id number] Arrival date & time: 08/19/22  1129     History  Chief Complaint  Patient presents with   Hand Pain    [name] is a 31 y.o. female.  Patient is a 31 year old female who presents with pain and swelling to her right hand.  She endorses fentanyl abuse.  She said about a week and a half ago she injected into her right hand and noted some little bit of swelling after that.  She says since yesterday it got more swollen and is more tender.  She denies any known fevers.       Home Medications Prior to Admission medications   Medication Sig Start Date End Date Taking? Authorizing Provider  doxycycline (VIBRAMYCIN) 100 MG capsule Take 1 capsule (100 mg total) by mouth 2 (two) times daily. One po bid x 7 days 08/19/22  Yes Rolan Bucco, MD  acetaminophen (TYLENOL) 325 MG tablet Take 2 tablets (650 mg total) by mouth every 6 (six) hours as needed for mild pain (or Fever >/= 101). 01/02/21   Russella Dar, NP  ibuprofen (ADVIL) 400 MG tablet Take 1 tablet (400 mg total) by mouth every 6 (six) hours as needed for moderate pain. 01/02/21   Russella Dar, NP  neomycin-bacitracin-polymyxin (NEOSPORIN) ointment Apply 1 application topically 3 (three) times daily as needed for wound care. Apply to face    [provider]  gabapentin (NEURONTIN) 400 MG capsule Take 400 mg by mouth 3 (three) times daily.  10/26/19  [provider]  mirtazapine (REMERON) 30 MG tablet Take 30 mg by mouth at bedtime.  10/26/19  [provider]  risperiDONE (RISPERDAL) 1 MG tablet Take 1 tablet (1 mg total) by mouth at bedtime. Patient not taking: Reported on 09/16/2018 07/13/18 10/26/19  Charm Rings, NP  traZODone (DESYREL) 50 MG tablet Take 1 tablet (50 mg total) by mouth at bedtime as needed for sleep. Patient not taking: Reported on 09/16/2018 07/13/18 10/26/19  Charm Rings, NP      Allergies     Dextromethorphan    Review of Systems   Review of Systems  Constitutional:  Negative for fever.  Gastrointestinal:  Negative for nausea and vomiting.  Musculoskeletal:  Negative for arthralgias, back pain, joint swelling and neck pain.       Hand swelling  Skin:  Positive for color change. Negative for wound.  Neurological:  Negative for weakness, numbness and headaches.    Physical Exam Updated Vital Signs BP 98/69   Pulse 67   Temp 97.6 F (36.4 C)   Resp 20   Ht 5' (1.524 m)   Wt 52.2 kg   SpO2 98%   BMI 22.46 kg/m  Physical Exam Constitutional:      Appearance: She is well-developed.  HENT:     Head: Normocephalic and atraumatic.  Cardiovascular:     Rate and Rhythm: Normal rate.  Pulmonary:     Effort: Pulmonary effort is normal.  Musculoskeletal:        General: Tenderness present.     Cervical back: Normal range of motion and neck supple.     Comments: Patient has some swelling and mild redness with tenderness over the dorsum of the right hand, more toward the thumb side.  There is no palpable induration or abscess.  There is no pain with range of motion of the fingers.  No extension past the wrist.  Skin:  General: Skin is warm and dry.  Neurological:     Mental Status: She is alert and oriented to person, place, and time.      ED Results / Procedures / Treatments   Labs (all labs ordered are listed, but only abnormal results are displayed) Labs Reviewed  CBC WITH DIFFERENTIAL/PLATELET - Abnormal; Notable for the following components:      Result Value   Hemoglobin 11.5 (*)    HCT 35.2 (*)    All other components within normal limits  BASIC METABOLIC PANEL - Abnormal; Notable for the following components:   Glucose, Bld 106 (*)    All other components within normal limits  HCG, SERUM, QUALITATIVE    EKG None  Radiology DG Hand Complete Right  Result Date: 08/19/2022 CLINICAL DATA:  Acute right hand swelling. EXAM: RIGHT HAND - COMPLETE 3+  VIEW COMPARISON:  None Available. FINDINGS: There is no evidence of fracture or dislocation. There is no evidence of arthropathy or other focal bone abnormality. Soft tissues are unremarkable. IMPRESSION: Negative. Electronically Signed   By: Lupita Raider M.D.   On: 08/19/2022 12:31    Procedures Procedures    Medications Ordered in ED Medications  vancomycin (VANCOCIN) IVPB 1000 mg/200 mL premix (1,000 mg Intravenous New Bag/Given 08/19/22 1402)    ED Course/ Medical Decision Making/ A&P                           Medical Decision Making Amount and/or Complexity of Data Reviewed Labs: ordered. Radiology: ordered.  Risk Prescription drug management.   Patient is a 31 year old female who presents with swelling and pain to her right hand.  It appears that she has some cellulitis of her hand.  There is no discernible abscess.  She does not have clinical suggestions of tenosynovitis.  She has full range of motion of her fingers.  She is otherwise well-appearing.  Her labs are nonconcerning.  No systemic symptoms.  At this point I feel that she can be treated as an outpatient rather than inpatient hospitalization.  She was given a dose of vancomycin in the ED.  We will discharge her with a prescription for doxycycline.  However she was given strict return precautions and advised that hand infections can get worse past and if she were to have any worsening symptoms to return and will likely need admission for IV antibiotics.  {Final Clinical Impression(s) / ED Diagnoses Final diagnoses:  Cellulitis of hand    Rx / DC Orders ED Discharge Orders          Ordered    doxycycline (VIBRAMYCIN) 100 MG capsule  2 times daily        08/19/22 1305              Rolan Bucco, MD 08/19/22 1422

## 2023-07-02 ENCOUNTER — Emergency Department (HOSPITAL_COMMUNITY)
Admission: EM | Admit: 2023-07-02 | Discharge: 2023-07-02 | Disposition: A | Discharge status: Home / Self Care | Payer: Self-pay | Attending: Emergency Medicine | Admitting: Emergency Medicine

## 2023-07-02 ENCOUNTER — Other Ambulatory Visit: Payer: Self-pay

## 2023-07-02 ENCOUNTER — Encounter (HOSPITAL_COMMUNITY): Payer: Self-pay

## 2023-07-02 DIAGNOSIS — R21 Rash and other nonspecific skin eruption: Secondary | ICD-10-CM | POA: Insufficient documentation

## 2023-07-02 MED ORDER — DOXYCYCLINE HYCLATE 100 MG PO TABS
100.0000 mg | ORAL_TABLET | Freq: Once | ORAL | Status: AC
  Administered 2023-07-02: 100 mg via ORAL
  Filled 2023-07-02: qty 1

## 2023-07-02 MED ORDER — DOXYCYCLINE HYCLATE 100 MG PO CAPS: 100.0000 mg | ORAL_CAPSULE | Freq: Two times a day (BID) | ORAL | 0 refills | Status: AC

## 2023-07-02 NOTE — ED Triage Notes (Addendum)
Pt complaining of a rash all over that is being made worse with sweating, no itching but it does burn. Is worse in the folds of the body and on her face. This started on wednesday

## 2023-07-02 NOTE — ED Provider Notes (Signed)
[facility]-EMERGENCY DEPT [facility] Emergency Department Provider Note MRN:  [id number]  Arrival date & time: 07/02/23     Chief Complaint   Rash   History of Present Illness   [name] is a 32 y.o. year-old female presents to the ED with chief complaint of rash.  She states that she just got out of jail and has sores all over her body.  She denies associated fever.  Hx of IVDU, but denies use in the last 40 days because she was in jail.  She states that she does pick at her sores, but has been trying not to pick at these sores.  She states that her symptoms are exacerbated when she sweats.  History provided by patient.   Review of Systems  Pertinent positive and negative review of systems noted in HPI.    Physical Exam   Vitals:   07/02/23 2121 07/02/23 2318  BP: (!) 134/90 124/88  Pulse: (!) 123 (!) 106  Resp: 16 18  Temp: 99 F (37.2 C) 99.2 F (37.3 C)  SpO2: 97% 99%    CONSTITUTIONAL:  non toxic-appearing, NAD NEURO:  Alert and oriented x 3, CN 3-12 grossly intact EYES:  eyes equal and reactive ENT/NECK:  Supple, no stridor  CARDIO:  tachycardic, regular rhythm, appears well-perfused  PULM:  No respiratory distress,  GI/GU:  non-distended,  MSK/SPINE:  No gross deformities, no edema, moves all extremities  SKIN:  sores diffusely on face, chest, upper extremities, no visible abscess   *Additional and/or pertinent findings included in MDM below  Diagnostic and Interventional Summary    EKG Interpretation Date/Time:    Ventricular Rate:    PR Interval:    QRS Duration:    QT Interval:    QTC Calculation:   R Axis:      Text Interpretation:         Labs Reviewed - No data to display  No orders to display    Medications  doxycycline (VIBRA-TABS) tablet 100 mg (100 mg Oral Given 07/02/23 2232)     Procedures  /  Critical Care Procedures  ED Course and Medical Decision Making  I have reviewed the triage vital signs, the nursing notes, and  pertinent available records from the EMR.  Social Determinants Affecting Complexity of Care: Patient has no clinically significant social determinants affecting this chief complaint..   ED Course:    Medical Decision Making Patient here with diffuse sores on face, chest, and upper extremities.  States that she just got out of jail.  She states that she does pick her sores, but has been trying not lately.  She states she hasn't used IV drugs for the past 40 days for being in jail.  Will treat with doxycycline.   Risk Prescription drug management.         Consultants: No consultations were needed in caring for this patient.   Treatment and Plan: Emergency department workup does not suggest an emergent condition requiring admission or immediate intervention beyond  what has been performed at this time. The patient is safe for discharge and has  been instructed to return immediately for worsening symptoms, change in  symptoms or any other concerns    Final Clinical Impressions(s) / ED Diagnoses     ICD-10-CM   1. Rash  R21       ED Discharge Orders          Ordered    doxycycline (VIBRAMYCIN) 100 MG capsule  2 times  daily        07/02/23 2307              Discharge Instructions Discussed with and Provided to Patient:   Discharge Instructions   None      Roxy Horseman, PA-C 07/02/23 2327    Virgina Norfolk, DO 07/03/23 1818

## 2024-12-28 ENCOUNTER — Other Ambulatory Visit: Payer: Self-pay

## 2024-12-28 ENCOUNTER — Emergency Department (HOSPITAL_COMMUNITY)
Admission: EM | Admit: 2024-12-28 | Discharge: 2024-12-29 | Disposition: A | Discharge status: Home / Self Care | Attending: Emergency Medicine | Admitting: Emergency Medicine

## 2024-12-28 ENCOUNTER — Encounter (HOSPITAL_COMMUNITY): Payer: Self-pay | Admitting: Emergency Medicine

## 2024-12-28 DIAGNOSIS — K0889 Other specified disorders of teeth and supporting structures: Secondary | ICD-10-CM | POA: Diagnosis present

## 2024-12-28 DIAGNOSIS — K029 Dental caries, unspecified: Secondary | ICD-10-CM | POA: Diagnosis not present

## 2024-12-28 DIAGNOSIS — R000 Tachycardia, unspecified: Secondary | ICD-10-CM | POA: Insufficient documentation

## 2024-12-28 NOTE — ED Triage Notes (Addendum)
" °  Patient comes in with dental pain and requesting antibiotics.  Patient states she has known issues with her teeth and is having multiple teeth pulled on 1/15.  Patient is concerned she has an infection and would like it treated before surgery.  States she was last given amoxicillin  but states that never helps.  Pain 5/10, aching.    Patient endorses IV fentanyl  use and has smoked methamphetamines.  "

## 2024-12-29 MED ORDER — NYSTATIN 100000 UNIT/ML MT SUSP: 5.0000 mL | Freq: Three times a day (TID) | OROMUCOSAL | 0 refills | Status: AC

## 2024-12-29 MED ORDER — AMOXICILLIN-POT CLAVULANATE 875-125 MG PO TABS
1.0000 | ORAL_TABLET | Freq: Once | ORAL | Status: AC
  Administered 2024-12-29: 1 via ORAL
  Filled 2024-12-29: qty 1

## 2024-12-29 MED ORDER — MAGIC MOUTHWASH: 5.0000 mL | Freq: Three times a day (TID) | ORAL | 0 refills | Status: DC

## 2024-12-29 MED ORDER — AMOXICILLIN-POT CLAVULANATE 875-125 MG PO TABS: 1.0000 | ORAL_TABLET | Freq: Two times a day (BID) | ORAL | 0 refills | Status: AC

## 2024-12-29 NOTE — Discharge Instructions (Addendum)
 Please begin taking augmentin twice a day for 7 days. Please use magic mouthwash 3 times per day by swishing and spitting. Please follow up with dentistry as discussed.  Review attached guide concerning dental resources in the area if you are not able to follow-up with dentist on 1/15.  Return to ED with new symptoms like trouble swallowing, fevers or nausea or vomiting.  You may take ibuprofen  or Tylenol  for pain.

## 2024-12-29 NOTE — ED Provider Notes (Signed)
 " [place] EMERGENCY DEPARTMENT AT [facility] Provider Note   CSN: [id number] Arrival date & time: 12/28/24  2320     Patient presents with: Dental Pain   [name] is a 34 y.o. female with history of IV drug use, opioid abuse, polysubstance abuse.  Presents to ED requesting antibiotics for suspected tooth infection.  Reports that she has significant injuries with her dentistry.  Reports that she has had to have multiple teeth removed on 1/15 with dentist.  States that she is here requesting antibiotics because she feels as if her teeth are infected.  She denies any recent fevers, nausea or vomiting.  She arrives slightly tachycardic but does endorse that she has used methamphetamine recently.  She states that she was recently released from the jail 1 year ago and has had multiple issues with her teeth since then.  Denies trouble swallowing or drooling.   Dental Pain      Prior to Admission medications  Medication Sig Start Date End Date Taking? Authorizing Provider  amoxicillin -clavulanate (AUGMENTIN) 875-125 MG tablet Take 1 tablet by mouth every 12 (twelve) hours. 12/29/24  Yes Ruthell Lonni FALCON, PA-C  magic mouthwash (nystatin, lidocaine , diphenhydrAMINE , alum & mag hydroxide) suspension Swish and spit 5 mLs 3 (three) times daily. 12/29/24  Yes Ruthell Lonni FALCON, PA-C  acetaminophen  (TYLENOL ) 325 MG tablet Take 2 tablets (650 mg total) by mouth every 6 (six) hours as needed for mild pain (or Fever >/= 101). 01/02/21   Alto Isaiah CROME, NP  doxycycline  (VIBRAMYCIN ) 100 MG capsule Take 1 capsule (100 mg total) by mouth 2 (two) times daily. 07/02/23   Vicky Charleston, PA-C  ibuprofen  (ADVIL ) 400 MG tablet Take 1 tablet (400 mg total) by mouth every 6 (six) hours as needed for moderate pain. 01/02/21   Alto Isaiah CROME, NP  neomycin-bacitracin-polymyxin (NEOSPORIN) ointment Apply 1 application topically 3 (three) times daily as needed for wound care. Apply to face    [provider]  gabapentin  (NEURONTIN ) 400 MG capsule Take 400 mg by mouth 3 (three) times daily.  10/26/19  [provider]  mirtazapine  (REMERON ) 30 MG tablet Take 30 mg by mouth at bedtime.  10/26/19  [provider]  risperiDONE  (RISPERDAL ) 1 MG tablet Take 1 tablet (1 mg total) by mouth at bedtime. Patient not taking: Reported on 09/16/2018 07/13/18 10/26/19  Jacquetta Sharlot GRADE, NP  traZODone  (DESYREL ) 50 MG tablet Take 1 tablet (50 mg total) by mouth at bedtime as needed for sleep. Patient not taking: Reported on 09/16/2018 07/13/18 10/26/19  Jacquetta Sharlot GRADE, NP    Allergies: Dextromethorphan    Review of Systems  All other systems reviewed and are negative.   Updated Vital Signs BP (!) 138/93 (BP Location: Left Arm)   Pulse (!) 118   Temp 98 F (36.7 C) (Oral)   Resp 18   Ht 5' (1.524 m)   Wt 52.2 kg   LMP 12/14/2024   SpO2 100%   BMI 22.46 kg/m   Physical Exam Vitals and nursing note reviewed.  Constitutional:      General: She is not in acute distress.    Appearance: She is well-developed.  HENT:     Head: Normocephalic and atraumatic.     Mouth/Throat:     Comments: Multiple dental caries throughout. No abscess needing drainage, uvula midline, handling secretions appropriately, no drooling. No ludwig angina Eyes:     Conjunctiva/sclera: Conjunctivae normal.  Cardiovascular:     Rate  and Rhythm: Regular rhythm. Tachycardia present.     Heart sounds: No murmur heard. Pulmonary:     Effort: Pulmonary effort is normal. No respiratory distress.     Breath sounds: Normal breath sounds.  Abdominal:     Palpations: Abdomen is soft.     Tenderness: There is no abdominal tenderness.  Musculoskeletal:        General: No swelling.     Cervical back: Neck supple.  Skin:    General: Skin is warm and dry.     Capillary Refill: Capillary refill takes less than 2 seconds.  Neurological:     Mental Status: She is alert and oriented to person, place, and time.  Mental status is at baseline.  Psychiatric:        Mood and Affect: Mood normal.     (all labs ordered are listed, but only abnormal results are displayed) Labs Reviewed - No data to display  EKG: None  Radiology: No results found.  Procedures   Medications Ordered in the ED  amoxicillin -clavulanate (AUGMENTIN) 875-125 MG per tablet 1 tablet (has no administration in time range)     Medical Decision Making  34 year old female presents for evaluation.  On exam, tachycardic most likely due to methamphetamine use.  Lung sounds clear bilaterally, no hypoxia.  Abdomen soft and compressible.  Neuroexam at baseline.  Denies chest pain or shortness of breath.  He requesting antibiotics for tooth infection.  Multiple dental caries throughout.  Patient reports that she is set to see dentistry on 1/15.  Patient provided with Augmentin prescription, given initial dose here as pharmacies closed at this hour.  Also provided with Magic mouthwash solution.  Advised to take ibuprofen  or Tylenol  for pain.  Discharged in stable addition.    Final diagnoses:  Pain, dental    ED Discharge Orders          Ordered    amoxicillin -clavulanate (AUGMENTIN) 875-125 MG tablet  Every 12 hours        12/29/24 0115    magic mouthwash SOLN  3 times daily,   Status:  Discontinued        12/29/24 0116    magic mouthwash (nystatin, lidocaine , diphenhydrAMINE , alum & mag hydroxide) suspension  3 times daily        12/29/24 0117               Ruthell Lonni FALCON, PA-C 12/29/24 0118    Jerral Meth, MD 12/29/24 [phone]  "
